# Patient Record
Sex: Male | Born: 1935 | Race: White | Hispanic: No | Smoking: Former smoker
Health system: Southern US, Community
[De-identification: ages and names within clinical notes are randomized; demographics above are authoritative.]

## PROBLEM LIST (undated history)

## (undated) DIAGNOSIS — N410 Acute prostatitis: Secondary | ICD-10-CM

## (undated) DIAGNOSIS — K5792 Diverticulitis of intestine, part unspecified, without perforation or abscess without bleeding: Secondary | ICD-10-CM

## (undated) DIAGNOSIS — E785 Hyperlipidemia, unspecified: Secondary | ICD-10-CM

## (undated) DIAGNOSIS — C189 Malignant neoplasm of colon, unspecified: Secondary | ICD-10-CM

## (undated) DIAGNOSIS — G909 Disorder of the autonomic nervous system, unspecified: Secondary | ICD-10-CM

## (undated) DIAGNOSIS — G619 Inflammatory polyneuropathy, unspecified: Secondary | ICD-10-CM

## (undated) DIAGNOSIS — I639 Cerebral infarction, unspecified: Secondary | ICD-10-CM

## (undated) DIAGNOSIS — G622 Polyneuropathy due to other toxic agents: Secondary | ICD-10-CM

## (undated) DIAGNOSIS — N400 Enlarged prostate without lower urinary tract symptoms: Secondary | ICD-10-CM

## (undated) DIAGNOSIS — Z8679 Personal history of other diseases of the circulatory system: Secondary | ICD-10-CM

## (undated) DIAGNOSIS — I5032 Chronic diastolic (congestive) heart failure: Secondary | ICD-10-CM

## (undated) DIAGNOSIS — I10 Essential (primary) hypertension: Secondary | ICD-10-CM

## (undated) DIAGNOSIS — G709 Myoneural disorder, unspecified: Secondary | ICD-10-CM

## (undated) DIAGNOSIS — G609 Hereditary and idiopathic neuropathy, unspecified: Secondary | ICD-10-CM

## (undated) DIAGNOSIS — E039 Hypothyroidism, unspecified: Secondary | ICD-10-CM

## (undated) DIAGNOSIS — L570 Actinic keratosis: Secondary | ICD-10-CM

## (undated) DIAGNOSIS — H44119 Panuveitis, unspecified eye: Secondary | ICD-10-CM

## (undated) DIAGNOSIS — D509 Iron deficiency anemia, unspecified: Secondary | ICD-10-CM

## (undated) DIAGNOSIS — I480 Paroxysmal atrial fibrillation: Secondary | ICD-10-CM

## (undated) DIAGNOSIS — R413 Other amnesia: Secondary | ICD-10-CM

## (undated) DIAGNOSIS — F0390 Unspecified dementia without behavioral disturbance: Secondary | ICD-10-CM

## (undated) DIAGNOSIS — I517 Cardiomegaly: Secondary | ICD-10-CM

## (undated) DIAGNOSIS — H3090 Unspecified chorioretinal inflammation, unspecified eye: Secondary | ICD-10-CM

## (undated) DIAGNOSIS — R269 Unspecified abnormalities of gait and mobility: Secondary | ICD-10-CM

## (undated) DIAGNOSIS — E079 Disorder of thyroid, unspecified: Secondary | ICD-10-CM

## (undated) DIAGNOSIS — M1990 Unspecified osteoarthritis, unspecified site: Secondary | ICD-10-CM

## (undated) DIAGNOSIS — G459 Transient cerebral ischemic attack, unspecified: Secondary | ICD-10-CM

## (undated) DIAGNOSIS — R42 Dizziness and giddiness: Secondary | ICD-10-CM

## (undated) DIAGNOSIS — I25810 Atherosclerosis of coronary artery bypass graft(s) without angina pectoris: Secondary | ICD-10-CM

## (undated) DIAGNOSIS — I6529 Occlusion and stenosis of unspecified carotid artery: Secondary | ICD-10-CM

## (undated) DIAGNOSIS — R609 Edema, unspecified: Secondary | ICD-10-CM

## (undated) DIAGNOSIS — G450 Vertebro-basilar artery syndrome: Secondary | ICD-10-CM

## (undated) HISTORY — DX: Hyperlipidemia, unspecified: E78.5

## (undated) HISTORY — DX: Paroxysmal atrial fibrillation: I48.0

## (undated) HISTORY — DX: Edema, unspecified: R60.9

## (undated) HISTORY — DX: Hereditary and idiopathic neuropathy, unspecified: G60.9

## (undated) HISTORY — DX: Essential (primary) hypertension: I10

## (undated) HISTORY — DX: Acute prostatitis: N41.0

## (undated) HISTORY — DX: Unspecified abnormalities of gait and mobility: R26.9

## (undated) HISTORY — DX: Disorder of thyroid, unspecified: E07.9

## (undated) HISTORY — DX: Benign prostatic hyperplasia without lower urinary tract symptoms: N40.0

## (undated) HISTORY — DX: Occlusion and stenosis of unspecified carotid artery: I65.29

## (undated) HISTORY — DX: Disorder of the autonomic nervous system, unspecified: G90.9

## (undated) HISTORY — DX: Polyneuropathy due to other toxic agents: G62.2

## (undated) HISTORY — DX: Unspecified dementia, unspecified severity, without behavioral disturbance, psychotic disturbance, mood disturbance, and anxiety: F03.90

## (undated) HISTORY — DX: Unspecified osteoarthritis, unspecified site: M19.90

## (undated) HISTORY — PX: APPENDECTOMY: SHX54

## (undated) HISTORY — DX: Dizziness and giddiness: R42

## (undated) HISTORY — DX: Panuveitis, unspecified eye: H44.119

## (undated) HISTORY — DX: Vertebro-basilar artery syndrome: G45.0

## (undated) HISTORY — DX: Transient cerebral ischemic attack, unspecified: G45.9

## (undated) HISTORY — DX: Iron deficiency anemia, unspecified: D50.9

## (undated) HISTORY — DX: Chronic diastolic (congestive) heart failure: I50.32

## (undated) HISTORY — DX: Cardiomegaly: I51.7

## (undated) HISTORY — DX: Cerebral infarction, unspecified: I63.9

## (undated) HISTORY — DX: Actinic keratosis: L57.0

## (undated) HISTORY — DX: Malignant neoplasm of colon, unspecified: C18.9

## (undated) HISTORY — DX: Personal history of other diseases of the circulatory system: Z86.79

## (undated) HISTORY — DX: Diverticulitis of intestine, part unspecified, without perforation or abscess without bleeding: K57.92

## (undated) HISTORY — DX: Atherosclerosis of coronary artery bypass graft(s) without angina pectoris: I25.810

## (undated) HISTORY — DX: Myoneural disorder, unspecified: G70.9

## (undated) HISTORY — DX: Other amnesia: R41.3

## (undated) HISTORY — DX: Hypothyroidism, unspecified: E03.9

## (undated) HISTORY — DX: Unspecified chorioretinal inflammation, unspecified eye: H30.90

## (undated) HISTORY — PX: CARDIAC CATHETERIZATION: SHX172

## (undated) HISTORY — DX: Inflammatory polyneuropathy, unspecified: G61.9

---

## 1989-02-18 HISTORY — PX: CORONARY ARTERY BYPASS GRAFT: SHX141

## 2000-04-05 ENCOUNTER — Ambulatory Visit (HOSPITAL_COMMUNITY): Admission: RE | Admit: 2000-04-05 | Discharge: 2000-04-05 | Payer: Self-pay | Admitting: Ophthalmology

## 2000-04-05 ENCOUNTER — Encounter: Payer: Self-pay | Admitting: Ophthalmology

## 2004-04-03 ENCOUNTER — Emergency Department (HOSPITAL_COMMUNITY): Admission: EM | Admit: 2004-04-03 | Discharge: 2004-04-03 | Payer: Self-pay | Admitting: Emergency Medicine

## 2005-01-12 ENCOUNTER — Emergency Department (HOSPITAL_COMMUNITY): Admission: EM | Admit: 2005-01-12 | Discharge: 2005-01-12 | Payer: Self-pay | Admitting: Emergency Medicine

## 2010-02-07 ENCOUNTER — Ambulatory Visit: Payer: Self-pay | Admitting: Cardiology

## 2010-12-06 ENCOUNTER — Telehealth: Payer: Self-pay | Admitting: Cardiology

## 2010-12-06 NOTE — Telephone Encounter (Signed)
Pt calling wanting to know if he needs to have his annual treadmill test when he comes to see [facility] on 10/30. Please return pt call to discuss further.

## 2010-12-11 ENCOUNTER — Other Ambulatory Visit: Payer: Self-pay | Admitting: *Deleted

## 2010-12-11 DIAGNOSIS — Z951 Presence of aortocoronary bypass graft: Secondary | ICD-10-CM

## 2010-12-11 NOTE — Telephone Encounter (Signed)
Spoke with patient and he only needed ETT, scheduled

## 2010-12-18 ENCOUNTER — Ambulatory Visit: Payer: Self-pay | Admitting: Cardiology

## 2010-12-21 DIAGNOSIS — H31109 Choroidal degeneration, unspecified, unspecified eye: Secondary | ICD-10-CM | POA: Insufficient documentation

## 2010-12-21 DIAGNOSIS — H3090 Unspecified chorioretinal inflammation, unspecified eye: Secondary | ICD-10-CM | POA: Insufficient documentation

## 2011-02-06 ENCOUNTER — Encounter: Payer: Self-pay | Admitting: *Deleted

## 2011-02-06 DIAGNOSIS — Z8679 Personal history of other diseases of the circulatory system: Secondary | ICD-10-CM | POA: Insufficient documentation

## 2011-02-08 ENCOUNTER — Ambulatory Visit (INDEPENDENT_AMBULATORY_CARE_PROVIDER_SITE_OTHER): Payer: Self-pay | Admitting: Cardiology

## 2011-02-08 DIAGNOSIS — Z951 Presence of aortocoronary bypass graft: Secondary | ICD-10-CM

## 2011-02-08 NOTE — Progress Notes (Signed)
Exercise Treadmill Test  Pre-Exercise Testing Evaluation Rhythm: sinus bradycardia  Rate: 57   PR:  18 QRS:  .08  QT:  .36 QTc: .35     Test  Exercise Tolerance Test Ordering MD: Cassell Clement M.D  Interpreting MD:  Cassell Clement M.D  Unique Test No: 1  Treadmill:  1  Indication for ETT: known ASHD  Contraindication to ETT: No   Stress Modality: exercise - treadmill  Cardiac Imaging Performed: non   Protocol: modified Bruce - maximal  Max BP:  161/112  Max MPHR (bpm):  137 85% MPR (bpm):  116  MPHR obtained (bpm):  99 % MPHR obtained:  75  Reached 85% MPHR (min:sec):   Total Exercise Time (min-sec):  6:00  Workload in METS:  3.0 Borg Scale: 15  Reason ETT Terminated:  patient's desire to stop    ST Segment Analysis At Rest: normal ST segments - no evidence of significant ST depression With Exercise: no evidence of significant ST depression  Other Information Arrhythmia:  Occassional PVC Angina during ETT:  absent (0) Quality of ETT:  non-diagnostic  ETT Interpretation:  normal - no evidence of ischemia by ST analysis  Comments: No angina with exercise.  Exercise limited by right leg weakness.  Recommendations: Continue present med.   Recheck 1 year.

## 2011-02-13 ENCOUNTER — Ambulatory Visit: Payer: Self-pay | Admitting: Cardiology

## 2011-02-13 ENCOUNTER — Encounter: Payer: Self-pay | Admitting: Cardiology

## 2011-02-14 ENCOUNTER — Telehealth: Payer: Self-pay | Admitting: Cardiology

## 2011-02-14 ENCOUNTER — Other Ambulatory Visit: Payer: Self-pay | Admitting: Family Medicine

## 2011-02-14 DIAGNOSIS — G459 Transient cerebral ischemic attack, unspecified: Secondary | ICD-10-CM

## 2011-02-14 NOTE — Telephone Encounter (Signed)
New Msg: Pt daughter calling wanting to know if pt needs to take both plavix and 81 mg aspirin. Pt was recently put on plavix 75 mg following TIA and hospital admission as prescribed by Dr. Madelin Rear at [facility] on [facility]. Please return pt daughter call to discuss further.

## 2011-02-14 NOTE — Telephone Encounter (Signed)
Advised daughter of change

## 2011-02-14 NOTE — Telephone Encounter (Signed)
Left message

## 2011-02-14 NOTE — Telephone Encounter (Signed)
Okay to take Plavix and reduce aspirin to just 81 mg a day. Okay to take Plavix today even though he has already taken a full 325 aspirin today

## 2011-02-14 NOTE — Telephone Encounter (Signed)
Patient was with daughter today and was at restaurant with his daughter when he went "out of it".  Per daughter the episode lasted for short time and they left and she took him to see his PCP.  Dr Madelin Rear wanted to start him on Plavix but daughter said she was to call and find out about the ASA, currently takes a 325 mg daily.  Also, has had that today, should he go ahead and take Plavix today.    Daughter waiting to hear back from PCP on when he goes to get carotid doppler.

## 2011-02-15 ENCOUNTER — Ambulatory Visit
Admission: RE | Admit: 2011-02-15 | Discharge: 2011-02-15 | Disposition: A | Discharge status: Home / Self Care | Payer: Medicare Other | Source: Ambulatory Visit | Attending: Family Medicine | Admitting: Family Medicine

## 2011-02-15 DIAGNOSIS — G459 Transient cerebral ischemic attack, unspecified: Secondary | ICD-10-CM

## 2011-02-22 DIAGNOSIS — H3090 Unspecified chorioretinal inflammation, unspecified eye: Secondary | ICD-10-CM | POA: Insufficient documentation

## 2011-04-18 ENCOUNTER — Other Ambulatory Visit (HOSPITAL_COMMUNITY): Payer: Self-pay | Admitting: Radiology

## 2011-04-18 DIAGNOSIS — G459 Transient cerebral ischemic attack, unspecified: Secondary | ICD-10-CM

## 2011-04-19 ENCOUNTER — Ambulatory Visit (HOSPITAL_COMMUNITY): Payer: Medicare Other | Attending: Cardiology

## 2011-04-19 ENCOUNTER — Other Ambulatory Visit: Payer: Self-pay

## 2011-04-19 DIAGNOSIS — I4891 Unspecified atrial fibrillation: Secondary | ICD-10-CM | POA: Insufficient documentation

## 2011-04-19 DIAGNOSIS — E785 Hyperlipidemia, unspecified: Secondary | ICD-10-CM | POA: Insufficient documentation

## 2011-04-19 DIAGNOSIS — G459 Transient cerebral ischemic attack, unspecified: Secondary | ICD-10-CM

## 2011-04-22 ENCOUNTER — Encounter (HOSPITAL_COMMUNITY): Payer: Self-pay | Admitting: Neurology

## 2012-07-04 ENCOUNTER — Encounter: Payer: Self-pay | Admitting: Nurse Practitioner

## 2012-07-04 DIAGNOSIS — G450 Vertebro-basilar artery syndrome: Secondary | ICD-10-CM

## 2012-07-04 DIAGNOSIS — R42 Dizziness and giddiness: Secondary | ICD-10-CM

## 2012-07-04 DIAGNOSIS — G609 Hereditary and idiopathic neuropathy, unspecified: Secondary | ICD-10-CM

## 2012-07-04 DIAGNOSIS — R2681 Unsteadiness on feet: Secondary | ICD-10-CM | POA: Insufficient documentation

## 2012-07-04 DIAGNOSIS — G909 Disorder of the autonomic nervous system, unspecified: Secondary | ICD-10-CM

## 2012-07-04 DIAGNOSIS — R269 Unspecified abnormalities of gait and mobility: Secondary | ICD-10-CM

## 2012-07-04 DIAGNOSIS — G459 Transient cerebral ischemic attack, unspecified: Secondary | ICD-10-CM

## 2012-07-04 DIAGNOSIS — G619 Inflammatory polyneuropathy, unspecified: Secondary | ICD-10-CM

## 2012-07-04 DIAGNOSIS — R413 Other amnesia: Secondary | ICD-10-CM

## 2012-07-04 DIAGNOSIS — G622 Polyneuropathy due to other toxic agents: Secondary | ICD-10-CM | POA: Insufficient documentation

## 2012-07-04 DIAGNOSIS — F0280 Dementia in other diseases classified elsewhere without behavioral disturbance: Secondary | ICD-10-CM | POA: Insufficient documentation

## 2012-07-04 DIAGNOSIS — G990 Autonomic neuropathy in diseases classified elsewhere: Secondary | ICD-10-CM | POA: Insufficient documentation

## 2012-07-04 HISTORY — DX: Disorder of the autonomic nervous system, unspecified: G90.9

## 2012-07-04 HISTORY — DX: Hereditary and idiopathic neuropathy, unspecified: G60.9

## 2012-07-04 HISTORY — DX: Dizziness and giddiness: R42

## 2012-07-04 HISTORY — DX: Vertebro-basilar artery syndrome: G45.0

## 2012-07-04 HISTORY — DX: Unspecified abnormalities of gait and mobility: R26.9

## 2012-07-04 HISTORY — DX: Inflammatory polyneuropathy, unspecified: G61.9

## 2012-07-04 HISTORY — DX: Transient cerebral ischemic attack, unspecified: G45.9

## 2012-07-06 ENCOUNTER — Ambulatory Visit (INDEPENDENT_AMBULATORY_CARE_PROVIDER_SITE_OTHER): Payer: Medicare Other | Admitting: Nurse Practitioner

## 2012-07-06 ENCOUNTER — Encounter: Payer: Self-pay | Admitting: Nurse Practitioner

## 2012-07-06 VITALS — BP 142/71 | HR 57 | Ht 70.0 in | Wt 177.0 lb

## 2012-07-06 DIAGNOSIS — G459 Transient cerebral ischemic attack, unspecified: Secondary | ICD-10-CM

## 2012-07-06 DIAGNOSIS — R413 Other amnesia: Secondary | ICD-10-CM

## 2012-07-06 DIAGNOSIS — G609 Hereditary and idiopathic neuropathy, unspecified: Secondary | ICD-10-CM

## 2012-07-06 DIAGNOSIS — R269 Unspecified abnormalities of gait and mobility: Secondary | ICD-10-CM

## 2012-07-06 NOTE — Patient Instructions (Addendum)
See  instructions for nursing home

## 2012-07-06 NOTE — Progress Notes (Signed)
HPI: Patient returns for followup after her last visit 06/24/2011. He had a  posterior circulation TIA in October 2009. He also has a history of chronic sensory polyneuropathy of undetermined origin with abnormality of gait. He is walking with a rolling walker last MRA of the head January 2013 shows no significant stenosis, MRA of the neck shows mild stenosis of the proximal left internal carotid artery. He has had no episodes of TIA or stroke since last seen. He denies dizziness numbness. He denies swallowing or speech difficulties. His appetite is good. He is in a skilled facility. He returns today with his daughter. Memory is stable  ROS:  - memory loss, confusion, weakness  Physical Exam General: well developed, well nourished, seated, in no evident distress Head: head normocephalic and atraumatic. Oropharynx benign Neck: supple with no carotid  bruits Cardiovascular: regular rate and rhythm, no murmurs  Neurologic Exam Mental Status: Awake and fully alert. Oriented to place and time. MMSE 22/30 items in orientation calculation and recall. Last MMSE 06/24/2011 was 23/30. AFT 13.  Mood and affect appropriate.  Cranial Nerves: Fundoscopic exam reveals sharp disc margins. Pupils equal, briskly reactive to light. Extraocular movements full without nystagmus. Visual fields full to confrontation. Hearing decreased bilaterally  Face, tongue, palate move normally and symmetrically.  Motor: Normal bulk and tone. Normal strength in all tested extremity muscles. No focal weakness Sensory.: Decreased vibratory to ankles decreased pinprick in stocking fashion to upper calf.  Coordination: Rapid alternating movements normal in all extremities. Finger-to-nose performed accurately bilaterally. Gait and Station: Arises from chair with pushoff  Stance is wide-based using a rolling walker, no difficulty with turns  Reflexes: 1+ and symmetric except absent ankle jerks. Toes downgoing.     ASSESSMENT: Remote  history of posterior circulation TIA in October 2009 and chronic sensory polyneuropathy of undetermined origin. Abnormality of gait episode of transient unresponsiveness 02/14/2011 with negative neurovascular workup     PLAN: We will continue his Plavix daily Continue his cerefolin , he has been changed  from Metanx.  His memory is stable from last year and is probably due to vascular memory loss Continue his home exercise program by walking several times a day for exercise He will followup in 6 months  Nilda Riggs, GNP-BC APRN

## 2012-10-07 ENCOUNTER — Ambulatory Visit: Payer: Self-pay | Admitting: Nurse Practitioner

## 2013-01-27 ENCOUNTER — Other Ambulatory Visit: Payer: Self-pay | Admitting: Family Medicine

## 2013-01-27 DIAGNOSIS — R410 Disorientation, unspecified: Secondary | ICD-10-CM

## 2013-01-27 DIAGNOSIS — R269 Unspecified abnormalities of gait and mobility: Secondary | ICD-10-CM

## 2013-01-29 ENCOUNTER — Ambulatory Visit
Admission: RE | Admit: 2013-01-29 | Discharge: 2013-01-29 | Disposition: A | Discharge status: Home / Self Care | Payer: Medicare Other | Source: Ambulatory Visit | Attending: Family Medicine | Admitting: Family Medicine

## 2013-01-29 ENCOUNTER — Other Ambulatory Visit: Payer: Medicare Other

## 2013-01-29 DIAGNOSIS — R269 Unspecified abnormalities of gait and mobility: Secondary | ICD-10-CM

## 2013-01-29 DIAGNOSIS — R410 Disorientation, unspecified: Secondary | ICD-10-CM

## 2013-09-22 ENCOUNTER — Encounter: Payer: Self-pay | Admitting: Nurse Practitioner

## 2013-09-22 ENCOUNTER — Ambulatory Visit (INDEPENDENT_AMBULATORY_CARE_PROVIDER_SITE_OTHER): Payer: Medicare Other | Admitting: Nurse Practitioner

## 2013-09-22 VITALS — BP 123/66 | HR 82 | Ht 70.0 in | Wt 187.0 lb

## 2013-09-22 DIAGNOSIS — G609 Hereditary and idiopathic neuropathy, unspecified: Secondary | ICD-10-CM

## 2013-09-22 DIAGNOSIS — G459 Transient cerebral ischemic attack, unspecified: Secondary | ICD-10-CM

## 2013-09-22 DIAGNOSIS — R269 Unspecified abnormalities of gait and mobility: Secondary | ICD-10-CM

## 2013-09-22 DIAGNOSIS — R413 Other amnesia: Secondary | ICD-10-CM

## 2013-09-22 NOTE — Patient Instructions (Signed)
Memory score is stable Continue Plavix for secondary stroke prevention Continue walking for exercise, have wheelchair wheel fixed to prevent falls F/U in 6 months

## 2013-09-22 NOTE — Progress Notes (Signed)
[facility]  PATIENT: Jesse Burton DOB: September 04, 1925   REASON FOR VISIT: follow up for history of TIA and  sensory polyneuropathy, gait disorder   HISTORY OF PRESENT ILLNESS: Jesse Burton, 80 year old male returns for followup. He was last seen on 5 /19/ 2014. He had a posterior circulation TIA in October 2009. He also has a history of chronic sensory polyneuropathy of undetermined origin with abnormality of gait. He is walking with a rolling walker.  Last MRA of the head January 2013 shows no significant stenosis, MRA of the neck shows mild stenosis of the proximal left internal carotid artery. He has had no episodes of TIA or stroke since last seen. He denies dizziness, numbness. He denies swallowing or speech difficulties. His appetite is good. He is in a skilled facility. He has not had any falls.  He returns today with his daughter. Memory is stable from previous MMSE.   REVIEW OF SYSTEMS: Full 14 system review of systems performed and notable only for those listed, all others are neg:  Constitutional: N/A  Cardiovascular: N/A  Ear/Nose/Throat: N/A  Skin: N/A  Eyes: N/A  Respiratory: N/A  Gastroitestinal: N/A  Hematology/Lymphatic: N/A  Endocrine: N/A Musculoskeletal:N/A  Allergy/Immunology: N/A  Neurological: Memory loss Psychiatric: Decreased concentration Sleep : NA   ALLERGIES: Allergies  Allergen Reactions  . Sulfa Antibiotics     HOME MEDICATIONS: Outpatient Prescriptions Prior to Visit  Medication Sig Dispense Refill  . atorvastatin (LIPITOR) 40 MG tablet Take 40 mg by mouth daily.      Marland Kitchen azelastine (ASTELIN) 137 MCG/SPRAY nasal spray       . clopidogrel (PLAVIX) 75 MG tablet Take 75 mg by mouth daily.      Marland Kitchen GLUCOSAMINE PO Take by mouth. Taking 250 daily       . L-Methylfolate-B12-B6-B2 (CEREFOLIN PO) Take by mouth daily.        Marland Kitchen lisinopril (PRINIVIL,ZESTRIL) 10 MG tablet       . losartan (COZAAR) 50 MG tablet       . Multiple Vitamin  (MULTIVITAMIN) tablet Take 1 tablet by mouth daily.        . multivitamin (METANX) 3-35-2 MG TABS tablet Take 1 tablet by mouth daily.      . mycophenolate (CELLCEPT) 500 MG tablet Take by mouth 2 (two) times daily.      . Tamsulosin HCl (FLOMAX) 0.4 MG CAPS Take by mouth. Takes two daily       . aspirin 325 MG tablet Take 325 mg by mouth daily.        . digoxin (LANOXIN) 0.25 MG tablet Take 250 mcg by mouth daily.       Marland Kitchen levothyroxine (SYNTHROID, LEVOTHROID) 50 MCG tablet Take 50 mcg by mouth daily.        . simvastatin (ZOCOR) 80 MG tablet Take 80 mg by mouth at bedtime.         No facility-administered medications prior to visit.    PAST MEDICAL HISTORY: Past Medical History  Diagnosis Date  . History of atrial fibrillation   . Coronary artery disease   . Thyroid disease   . Memory loss   . Stroke     PAST SURGICAL HISTORY: Past Surgical History  Procedure Laterality Date  . Cardiac catheterization    . Coronary artery bypass graft  1991    grafts to LAD and RCA    FAMILY HISTORY: Family History  Problem Relation Age of Onset  . Congestive Heart Failure Father   .  Pneumonia Mother     SOCIAL HISTORY: History   Social History  . Marital Status: Widowed    Spouse Name: N/A    Number of Children: 3  . Years of Education: [place]   Occupational History  .     Social History Main Topics  . Smoking status: Former Research scientist (life sciences)  . Smokeless tobacco: Never Used  . Alcohol Use: No  . Drug Use: No  . Sexual Activity: Not on file   Other Topics Concern  . Not on file   Social History Narrative   Patient is retired from job of 42 years. Patient lives a lone. Pt is recently widowed after 43 years of marriage. Patient has 3 children. Patient drinks 2 cups caffeine a week, quit tobacco use in 1980 and has one drink per month.      PHYSICAL EXAM  Filed Vitals:   09/22/13 1521  BP: 123/66  Pulse: 82  Height: 5\' 10"  (1.778 m)  Weight: 187 lb (84.823 kg)   Body  mass index is 26.83 kg/(m^2). General: well developed, well nourished, seated, in no evident distress  Head: head normocephalic and atraumatic. Oropharynx benign  Neck: supple with no carotid bruits  Cardiovascular: regular rate and rhythm, no murmurs  Neurologic Exam  Mental Status: Awake and fully alert. Oriented to place and time. MMSE 23/30 items in orientation calculation and recall. AFT 9. Mood and affect appropriate.  Cranial Nerves:  Pupils equal, briskly reactive to light. Extraocular movements full without nystagmus. Visual fields full to confrontation. Hearing decreased bilaterally Face, tongue, palate move normally and symmetrically.  Motor: Normal bulk and tone. Normal strength in all tested extremity muscles. No focal weakness  Sensory.: Decreased vibratory to ankles decreased pinprick in stocking fashion to upper calf.  Coordination: Rapid alternating movements normal in all extremities. Finger-to-nose performed accurately bilaterally.  Gait and Station: Arises from chair with pushoff Stance is wide-based using a rolling walker, no difficulty with turns  Reflexes: 1+ and symmetric except absent ankle jerks. Toes downgoing.     DIAGNOSTIC DATA (LABS, IMAGING, TESTING) -  ASSESSMENT AND PLAN  80 y.o. year old male  has a past medical history of posterior circulation TIA in October 2009 and chronic sensory polyneuropathy of undetermined origin. Abnormality of gait episode of transient unresponsiveness 02/14/2011 with negative neurovascular workup.    Continue Plavix for secondary stroke prevention Continue walking for exercise, have wheelchair wheel fixed to prevent falls F/U in 6 months, next visit with Dr. Lenis Dickinson Cecille Rubin, [facility], [facility], [facility] [address] [place], [place] [address] [phone]

## 2013-09-23 NOTE — Progress Notes (Signed)
I agree with the above plan 

## 2013-12-01 ENCOUNTER — Encounter: Payer: Self-pay | Admitting: Nurse Practitioner

## 2013-12-01 ENCOUNTER — Non-Acute Institutional Stay (SKILLED_NURSING_FACILITY): Payer: Medicare Other | Admitting: Nurse Practitioner

## 2013-12-01 DIAGNOSIS — G619 Inflammatory polyneuropathy, unspecified: Secondary | ICD-10-CM

## 2013-12-01 DIAGNOSIS — I25811 Atherosclerosis of native coronary artery of transplanted heart without angina pectoris: Secondary | ICD-10-CM

## 2013-12-01 DIAGNOSIS — R413 Other amnesia: Secondary | ICD-10-CM

## 2013-12-01 DIAGNOSIS — R269 Unspecified abnormalities of gait and mobility: Secondary | ICD-10-CM

## 2013-12-01 DIAGNOSIS — E039 Hypothyroidism, unspecified: Secondary | ICD-10-CM

## 2013-12-01 DIAGNOSIS — G609 Hereditary and idiopathic neuropathy, unspecified: Secondary | ICD-10-CM

## 2013-12-01 DIAGNOSIS — I10 Essential (primary) hypertension: Secondary | ICD-10-CM

## 2013-12-01 DIAGNOSIS — I25769 Atherosclerosis of bypass graft of coronary artery of transplanted heart with unspecified angina pectoris: Secondary | ICD-10-CM

## 2013-12-01 DIAGNOSIS — G622 Polyneuropathy due to other toxic agents: Secondary | ICD-10-CM

## 2013-12-01 DIAGNOSIS — G458 Other transient cerebral ischemic attacks and related syndromes: Secondary | ICD-10-CM

## 2013-12-01 DIAGNOSIS — Z8679 Personal history of other diseases of the circulatory system: Secondary | ICD-10-CM

## 2013-12-01 HISTORY — DX: Essential (primary) hypertension: I10

## 2013-12-01 HISTORY — DX: Hypothyroidism, unspecified: E03.9

## 2013-12-01 NOTE — Assessment & Plan Note (Signed)
Mild elevated Sbp. Takes Losartan 50mg  and Furosemide 20mg /40mg  alternating dose. Update CMp

## 2013-12-01 NOTE — Progress Notes (Signed)
Patient ID: Jesse Burton, male   DOB: 1926/01/29, 80 y.o.   MRN: [id number]   Code Status: full code  Allergies  Allergen Reactions  . Sulfa Antibiotics     Chief Complaint  Patient presents with  . Medical Management of Chronic Issues    HPI: Patient is a 80 y.o. male seen in the SNF at [facility] today for evaluation of  chronic medical conditions.       Problem List Items Addressed This Visit   Transient cerebral ischemia - Primary     a posterior circulation TIA in October 2009. Takes Plavix and Lipitor for risk reduction.     Memory loss     Will update MMSE    Inflammatory and toxic neuropathy     Managed with Cellcept    Hypothyroidism     Takes Levothyroxine 53mcg daily. Update TSH and CBC    HTN (hypertension)     Mild elevated Sbp. Takes Losartan 50mg  and Furosemide 20mg /40mg  alternating dose. Update CMp    History of atrial fibrillation     Heart rate is in control, takes Digoxin. Update Digoxin level.     Hereditary and idiopathic peripheral neuropathy     a history of chronic sensory polyneuropathy of undetermined origin with abnormality of gait. He is walking with a rolling walker. Last MRA of the head January 2013 shows no significant stenosis, MRA of the neck shows mild stenosis of the proximal left internal carotid artery.  Takes Metanx, Tylenol, Tramadol, and Cellcept. Ambulates with walker.  F/u Neurology    CAD (coronary artery disease) of artery bypass graft     No angina since admitted to SNF [facility]. F/u Cardiology.     Abnormality of gait     Continue therapy to maintain his physical functional level.        Review of Systems:  Review of Systems  Constitutional: Negative for fever, chills, weight loss, malaise/fatigue and diaphoresis.  HENT: Positive for hearing loss. Negative for congestion, ear discharge, ear pain, nosebleeds, sore throat and tinnitus.        Mild  Eyes: Negative for blurred vision, double vision, photophobia,  pain, discharge and redness.  Respiratory: Negative for cough, hemoptysis, sputum production, shortness of breath, wheezing and stridor.   Cardiovascular: Negative for chest pain, palpitations, orthopnea, claudication, leg swelling and PND.  Gastrointestinal: Negative for heartburn, nausea, vomiting, abdominal pain, diarrhea, constipation, blood in stool and melena.  Genitourinary: Negative for dysuria, urgency, frequency, hematuria and flank pain.  Musculoskeletal: Positive for joint pain. Negative for back pain, falls, myalgias and neck pain.       Aches in BUE  Skin: Negative for itching and rash.  Neurological: Positive for tingling and sensory change. Negative for dizziness, tremors, speech change, focal weakness, seizures, loss of consciousness, weakness and headaches.  Endo/Heme/Allergies: Negative for environmental allergies and polydipsia. Does not bruise/bleed easily.  Psychiatric/Behavioral: Positive for memory loss. Negative for depression, suicidal ideas, hallucinations and substance abuse. The patient is not nervous/anxious and does not have insomnia.    (type .ros)  Past Medical History  Diagnosis Date  . History of atrial fibrillation   . Coronary artery disease   . Thyroid disease   . Memory loss   . Stroke    Past Surgical History  Procedure Laterality Date  . Cardiac catheterization    . Coronary artery bypass graft  1991    grafts to LAD and RCA   Social History:   reports that  he has quit smoking. He has never used smokeless tobacco. He reports that he does not drink alcohol or use illicit drugs.  Family History  Problem Relation Age of Onset  . Congestive Heart Failure Father   . Pneumonia Mother     Medications: Patient's Medications  New Prescriptions   No medications on file  Previous Medications   ACETAMINOPHEN (TYLENOL) 325 MG TABLET    Take 650 mg by mouth every 6 (six) hours as needed.   ATORVASTATIN (LIPITOR) 40 MG TABLET    Take 40 mg by mouth  daily.   AZELASTINE (ASTELIN) 137 MCG/SPRAY NASAL SPRAY       CETIRIZINE (ZYRTEC) 10 MG TABLET    Take 10 mg by mouth daily.   CLOPIDOGREL (PLAVIX) 75 MG TABLET    Take 75 mg by mouth daily.   DIGOXIN (LANOXIN) 0.125 MG TABLET    Take 0.125 mg by mouth daily.   FUROSEMIDE (LASIX) 20 MG TABLET    Take 20 mg by mouth daily. Taking 1 tablet every other day, and 2 pills = 40 mg on the other days   GLUCOSAMINE PO    Take by mouth. Taking 250 daily    L-METHYLFOLATE-B12-B6-B2 (CEREFOLIN PO)    Take by mouth daily.     LEVOTHYROXINE (SYNTHROID, LEVOTHROID) 88 MCG TABLET    Take 88 mcg by mouth daily before breakfast.   LISINOPRIL (PRINIVIL,ZESTRIL) 10 MG TABLET       LOSARTAN (COZAAR) 50 MG TABLET       MULTIPLE VITAMIN (MULTIVITAMIN) TABLET    Take 1 tablet by mouth daily.     MULTIVITAMIN (METANX) 3-35-2 MG TABS TABLET    Take 1 tablet by mouth daily.   MYCOPHENOLATE (CELLCEPT) 500 MG TABLET    Take by mouth 2 (two) times daily.   TAMSULOSIN HCL (FLOMAX) 0.4 MG CAPS    Take by mouth. Takes two daily    TRAMADOL (ULTRAM) 50 MG TABLET    Take 50 mg by mouth every 6 (six) hours as needed.  Modified Medications   No medications on file  Discontinued Medications   No medications on file     Physical Exam: Physical Exam  Constitutional: He appears well-developed and well-nourished. No distress.  HENT:  Head: Normocephalic and atraumatic.  Right Ear: External ear normal.  Left Ear: External ear normal.  Nose: Nose normal.  Mouth/Throat: Oropharynx is clear and moist. No oropharyngeal exudate.  Eyes: Conjunctivae and EOM are normal. Pupils are equal, round, and reactive to light. Right eye exhibits no discharge. Left eye exhibits no discharge. No scleral icterus.  Neck: Normal range of motion. Neck supple. No JVD present. No tracheal deviation present. No thyromegaly present.  Cardiovascular: Normal rate and regular rhythm.  Exam reveals no gallop and no friction rub.   Murmur heard. Systolic  murmur 5-6/8 right sternal border   Pulmonary/Chest: Effort normal and breath sounds normal. No stridor. No respiratory distress. He has no wheezes. He has no rales. He exhibits no tenderness.  Abdominal: Soft. Bowel sounds are normal. He exhibits no distension and no mass. There is no tenderness. There is no rebound and no guarding.  Musculoskeletal: Normal range of motion. He exhibits no edema and no tenderness.  Lymphadenopathy:    He has no cervical adenopathy.  Neurological: He is alert. He has normal reflexes. He displays normal reflexes. No cranial nerve deficit. He exhibits normal muscle tone. Coordination normal.  Skin: Skin is warm and dry. No rash noted. He  is not diaphoretic. No erythema. No pallor.  Psychiatric: He has a normal mood and affect. His behavior is normal. Judgment and thought content normal. Cognition and memory are impaired. He exhibits abnormal recent memory. He exhibits normal remote memory.    Filed Vitals:   12/01/13 1237  BP: 162/80  Pulse: 78  Temp: 97.6 F (36.4 C)  TempSrc: Tympanic  Resp: 20      Labs reviewed: Basic Metabolic Panel: No results found for this basename: NA, K, CL, CO2, GLUCOSE, BUN, CREATININE, CALCIUM, MG, PHOS, TSH,  in the last 8760 hours Liver Function Tests: No results found for this basename: AST, ALT, ALKPHOS, BILITOT, PROT, ALBUMIN,  in the last 8760 hours No results found for this basename: LIPASE, AMYLASE,  in the last 8760 hours No results found for this basename: AMMONIA,  in the last 8760 hours CBC: No results found for this basename: WBC, NEUTROABS, HGB, HCT, MCV, PLT,  in the last 8760 hours Lipid Panel: No results found for this basename: CHOL, HDL, LDLCALC, TRIG, CHOLHDL, LDLDIRECT,  in the last 8760 hours  Past Procedures:     Assessment/Plan Transient cerebral ischemia a posterior circulation TIA in October 2009. Takes Plavix and Lipitor for risk reduction.   Hereditary and idiopathic peripheral  neuropathy a history of chronic sensory polyneuropathy of undetermined origin with abnormality of gait. He is walking with a rolling walker. Last MRA of the head January 2013 shows no significant stenosis, MRA of the neck shows mild stenosis of the proximal left internal carotid artery.  Takes Metanx, Tylenol, Tramadol, and Cellcept. Ambulates with walker.  F/u Neurology  History of atrial fibrillation Heart rate is in control, takes Digoxin. Update Digoxin level.   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 Update TSH and CBC  HTN (hypertension) Mild elevated Sbp. Takes Losartan 50mg  and Furosemide 20mg /40mg  alternating dose. Update CMp  Abnormality of gait Continue therapy to maintain his physical functional level.   CAD (coronary artery disease) of artery bypass graft No angina since admitted to SNF [facility]. F/u Cardiology.     Family/ Staff Communication: observe the patient  Goals of Care: SNF  Labs/tests ordered: CBC, CMP, TSH, Digoxin level, MMSE

## 2013-12-01 NOTE — Assessment & Plan Note (Addendum)
a history of chronic sensory polyneuropathy of undetermined origin with abnormality of gait. He is walking with a rolling walker. Last MRA of the head January 2013 shows no significant stenosis, MRA of the neck shows mild stenosis of the proximal left internal carotid artery.  Takes Metanx, Tylenol, Tramadol, and Cellcept. Ambulates with walker.  F/u Neurology

## 2013-12-01 NOTE — Assessment & Plan Note (Addendum)
Heart rate is in control, takes Digoxin. Update Digoxin level.

## 2013-12-01 NOTE — Assessment & Plan Note (Addendum)
Takes Levothyroxine 12mcg daily. Update TSH and CBC

## 2013-12-01 NOTE — Assessment & Plan Note (Signed)
Managed with Cellcept  

## 2013-12-01 NOTE — Assessment & Plan Note (Signed)
a posterior circulation TIA in October 2009. Takes Plavix and Lipitor for risk reduction.     

## 2013-12-01 NOTE — Assessment & Plan Note (Signed)
Continue therapy to maintain his physical functional level.

## 2013-12-01 NOTE — Assessment & Plan Note (Signed)
Will update MMSE °

## 2013-12-02 ENCOUNTER — Other Ambulatory Visit: Payer: Self-pay | Admitting: Family

## 2013-12-02 ENCOUNTER — Non-Acute Institutional Stay (SKILLED_NURSING_FACILITY): Payer: Medicare Other | Admitting: Internal Medicine

## 2013-12-02 ENCOUNTER — Encounter: Payer: Self-pay | Admitting: Internal Medicine

## 2013-12-02 DIAGNOSIS — I10 Essential (primary) hypertension: Secondary | ICD-10-CM

## 2013-12-02 DIAGNOSIS — R269 Unspecified abnormalities of gait and mobility: Secondary | ICD-10-CM

## 2013-12-02 DIAGNOSIS — N400 Enlarged prostate without lower urinary tract symptoms: Secondary | ICD-10-CM | POA: Insufficient documentation

## 2013-12-02 DIAGNOSIS — I25810 Atherosclerosis of coronary artery bypass graft(s) without angina pectoris: Secondary | ICD-10-CM | POA: Insufficient documentation

## 2013-12-02 DIAGNOSIS — Z8679 Personal history of other diseases of the circulatory system: Secondary | ICD-10-CM

## 2013-12-02 DIAGNOSIS — G609 Hereditary and idiopathic neuropathy, unspecified: Secondary | ICD-10-CM

## 2013-12-02 DIAGNOSIS — R609 Edema, unspecified: Secondary | ICD-10-CM | POA: Insufficient documentation

## 2013-12-02 DIAGNOSIS — R413 Other amnesia: Secondary | ICD-10-CM

## 2013-12-02 HISTORY — DX: Atherosclerosis of coronary artery bypass graft(s) without angina pectoris: I25.810

## 2013-12-02 LAB — HEPATIC FUNCTION PANEL
ALK PHOS: 64 U/L (ref 25–125)
ALT: 32 U/L (ref 10–40)
AST: 28 U/L (ref 14–40)
Bilirubin, Total: 0.9 mg/dL

## 2013-12-02 LAB — CBC AND DIFFERENTIAL
HEMATOCRIT: 34 % — AB (ref 41–53)
HEMOGLOBIN: 11.4 g/dL — AB (ref 13.5–17.5)
Platelets: 214 10*3/uL (ref 150–399)
WBC: 4.2 10^3/mL

## 2013-12-02 LAB — BASIC METABOLIC PANEL
BUN: 22 mg/dL — AB (ref 4–21)
Creatinine: 1 mg/dL (ref 0.6–1.3)
Glucose: 90 mg/dL
POTASSIUM: 3.6 mmol/L (ref 3.4–5.3)
SODIUM: 141 mmol/L (ref 137–147)

## 2013-12-02 LAB — TSH: TSH: 3.22 u[IU]/mL (ref 0.41–5.90)

## 2013-12-02 NOTE — Assessment & Plan Note (Signed)
No angina since admitted to SNF [facility]. F/u Cardiology.

## 2013-12-02 NOTE — Progress Notes (Deleted)
Patient ID: Jesse Burton, male   DOB: March 26, 1925, 80 y.o.   MRN: [id number]    HISTORY AND PHYSICAL  Location:  [facility] Room Number: 30 Place of Service: SNF [phone])   Extended Emergency Contact Information Primary Emergency Contact: Farr,Laura Address: [address] [address] [place] Phone: [phone] Mobile Phone: [phone] Relation: Daughter Secondary Emergency Contact: Genia Plants [place] of [place] Phone: [phone] Relation: Daughter   Chief Complaint  Patient presents with  . New Admit To SNF    increased dependence, secondary to dementia    HPI:  ***  Past Medical History  Diagnosis Date  . History of atrial fibrillation   . Coronary artery disease   . Thyroid disease   . Memory loss   . Stroke     Past Surgical History  Procedure Laterality Date  . Cardiac catheterization    . Coronary artery bypass graft  1991    grafts to LAD and RCA    Patient Care Team: Hulan Fess, MD as PCP - General (Family Medicine)  History   Social History  . Marital Status: Widowed    Spouse Name: N/A    Number of Children: 3  . Years of Education: [employer]   Occupational History  .     Social History Main Topics  . Smoking status: Former Research scientist (life sciences)  . Smokeless tobacco: Never Used  . Alcohol Use: No  . Drug Use: No  . Sexual Activity: Not on file   Other Topics Concern  . Not on file   Social History Narrative   Patient is retired from job of 32 years. Patient lives a lone. Pt is recently widowed after 73 years of marriage. Patient has 3 children. Patient drinks 2 cups caffeine a week, quit tobacco use in 1980 and has one drink per month.      reports that he has quit smoking. He has never used smokeless tobacco. He reports that he does not drink alcohol or use illicit drugs.   There is no immunization history on file for this patient.  Allergies  Allergen  Reactions  . Sulfa Antibiotics     Medications: Patient's Medications  New Prescriptions   No medications on file  Previous Medications   ACETAMINOPHEN (TYLENOL) 325 MG TABLET    Take 650 mg by mouth every 6 (six) hours as needed.   ATORVASTATIN (LIPITOR) 40 MG TABLET    Take 40 mg by mouth daily.   AZELASTINE (ASTELIN) 137 MCG/SPRAY NASAL SPRAY       CETIRIZINE (ZYRTEC) 10 MG TABLET    Take 10 mg by mouth daily.   CLOPIDOGREL (PLAVIX) 75 MG TABLET    Take 75 mg by mouth daily.   DIGOXIN (LANOXIN) 0.125 MG TABLET    Take 0.125 mg by mouth daily.   FUROSEMIDE (LASIX) 20 MG TABLET    Take 20 mg by mouth daily. Taking 1 tablet every other day, and 2 pills = 40 mg on the other days   GLUCOSAMINE PO    Take by mouth. Taking 250 daily    L-METHYLFOLATE-B12-B6-B2 (CEREFOLIN PO)    Take by mouth daily.     LEVOTHYROXINE (SYNTHROID, LEVOTHROID) 88 MCG TABLET    Take 88 mcg by mouth daily before breakfast.   LISINOPRIL (PRINIVIL,ZESTRIL) 10 MG TABLET       LOSARTAN (COZAAR) 50 MG TABLET  MULTIPLE VITAMIN (MULTIVITAMIN) TABLET    Take 1 tablet by mouth daily.     MULTIVITAMIN (METANX) 3-35-2 MG TABS TABLET    Take 1 tablet by mouth daily.   MYCOPHENOLATE (CELLCEPT) 500 MG TABLET    Take by mouth 2 (two) times daily.   TAMSULOSIN HCL (FLOMAX) 0.4 MG CAPS    Take by mouth. Takes two daily    TRAMADOL (ULTRAM) 50 MG TABLET    Take 50 mg by mouth every 6 (six) hours as needed.  Modified Medications   No medications on file  Discontinued Medications   No medications on file     Review of Systems  Filed Vitals:   12/02/13 1100  BP: 120/65  Pulse: 72  Temp: 97.6 F (36.4 C)  Resp: 16  Height: 5\' 6"  (1.676 m)  Weight: 185 lb 6.4 oz (84.097 kg)   Body mass index is 29.94 kg/(m^2).  Physical Exam   Labs reviewed: No results found for any previous visit.  No results found.   Assessment/Plan

## 2013-12-02 NOTE — Progress Notes (Signed)
Patient ID: Jesse Burton, male   DOB: 1925-06-24, 80 y.o.   MRN: [id number]    HISTORY AND PHYSICAL  Location:  [facility] Room Number: 29 Place of Service: SNF [phone])   Extended Emergency Contact Information Primary Emergency Contact: Farr,Laura Address: [address] [place] [address] [place] Phone: [phone] Mobile Phone: [phone] Relation: Daughter Secondary Emergency Contact: Genia Plants [place] of [place] Phone: [phone] Relation: Daughter   Chief Complaint  Patient presents with  . New Admit To SNF    increased dependence, secondary to dementia    HPI:  Memory loss: has recently moved to SNF d/t increasing care needs and increased confusion. Today is disoriented to time, place and situation. Recently wandered off campus and was unable to find his way back to [facility].  Essential hypertension: controlled  Abnormality of gait: unsteady gait, walks independently with walker, reports improvement since increased PT involvement  Edema: stable, trace edema to bil LEs  History of atrial fibrillation: Controlled    Past Medical History  Diagnosis Date  . History of atrial fibrillation   . Thyroid disease   . Memory loss   . Stroke   . 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 Transient cerebral ischemia 07/04/2012    a posterior circulation TIA in October 2009.    Marland Kitchen HTN (hypertension) 12/01/2013  . CAD (coronary artery disease) of artery bypass graft 12/02/2013  . Hypothyroidism 12/01/2013  . Peripheral autonomic neuropathy in disorders classified elsewhere(337.1) 07/04/2012  . 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 Hereditary and idiopathic peripheral neuropathy 07/04/2012    a history of chronic sensory polyneuropathy of undetermined origin with abnormality of gait. He is walking with a rolling walker. Last MRA of the head January 2013 shows no significant stenosis, MRA of the  neck shows mild stenosis of the proximal left internal carotid artery.    . Abnormality of gait 07/04/2012  . Dizziness and giddiness 07/04/2012  . Benign prostatic hyperplasia   . Edema   . Chorioretinitis   . Panuveitis   . Internal carotid artery stenosis     left  . Mild left ventricular hypertrophy   . Actinic keratoses   . Diverticulitis   . Prostatitis, acute     1991    Past Surgical History  Procedure Laterality Date  . Cardiac catheterization    . Coronary artery bypass graft  1991    grafts to LAD and RCA  . Appendectomy      Patient Care Team: Estill Dooms, MD as PCP - General (Internal Medicine) Zebedee Iba, MD as Referring Physician (Ophthalmology) Darlin Coco, MD as Consulting Physician (Cardiology) Antony Contras, MD as Consulting Physician (Neurology) Festus Aloe, MD as Consulting Physician (Urology)  History   Social History  . Marital Status: Widowed    Spouse Name: N/A    Number of Children: 3  . Years of Education: [employer]   Occupational History  .     Social History Main Topics  . Smoking status: Former Smoker    Quit date: 12/03/1978  . Smokeless tobacco: Never Used  . Alcohol Use: No  . Drug Use: No  . Sexual Activity: Not on file   Other Topics Concern  . Not on file   Social History Narrative   Patient is retired from job of 60 years. Patient lives a lone. Pt is recently widowed after 31  years of marriage. Patient has 3 children. Patient drinks 2 cups caffeine a week, quit tobacco use in 1980 and has one drink per month.      reports that he quit smoking about 35 years ago. He has never used smokeless tobacco. He reports that he does not drink alcohol or use illicit drugs.  Immunization History  Administered Date(s) Administered  . Pneumococcal Polysaccharide-23 12/02/1993  . Tdap 01/08/2013  . Zoster 12/03/2007    Allergies  Allergen Reactions  . Sulfa Antibiotics     Medications: Patient's Medications    New Prescriptions   No medications on file  Previous Medications   ACETAMINOPHEN (TYLENOL) 325 MG TABLET    Take 650 mg by mouth every 6 (six) hours as needed.   ATORVASTATIN (LIPITOR) 40 MG TABLET    Take 40 mg by mouth daily.   AZELASTINE (ASTELIN) 137 MCG/SPRAY NASAL SPRAY       CETIRIZINE (ZYRTEC) 10 MG TABLET    Take 10 mg by mouth daily.   CLOPIDOGREL (PLAVIX) 75 MG TABLET    Take 75 mg by mouth daily.   DIGOXIN (LANOXIN) 0.125 MG TABLET    Take 0.125 mg by mouth daily.   FUROSEMIDE (LASIX) 20 MG TABLET    Take 20 mg by mouth daily. Taking 1 tablet every other day, and 2 pills = 40 mg on the other days   GLUCOSAMINE PO    Take by mouth. Taking 250 daily    L-METHYLFOLATE-B12-B6-B2 (CEREFOLIN PO)    Take by mouth daily.     LEVOTHYROXINE (SYNTHROID, LEVOTHROID) 88 MCG TABLET    Take 88 mcg by mouth daily before breakfast.   LISINOPRIL (PRINIVIL,ZESTRIL) 10 MG TABLET       LOSARTAN (COZAAR) 50 MG TABLET       MULTIPLE VITAMIN (MULTIVITAMIN) TABLET    Take 1 tablet by mouth daily.     MULTIVITAMIN (METANX) 3-35-2 MG TABS TABLET    Take 1 tablet by mouth daily.   MYCOPHENOLATE (CELLCEPT) 500 MG TABLET    Take by mouth 2 (two) times daily.   TAMSULOSIN HCL (FLOMAX) 0.4 MG CAPS    Take by mouth. Takes two daily    TRAMADOL (ULTRAM) 50 MG TABLET    Take 50 mg by mouth every 6 (six) hours as needed.  Modified Medications   No medications on file  Discontinued Medications   No medications on file     Review of Systems  Constitutional: Negative for activity change, appetite change and unexpected weight change.  HENT: Negative.   Eyes: Negative.   Respiratory: Negative for cough, chest tightness and shortness of breath.   Cardiovascular: Positive for leg swelling. Negative for chest pain and palpitations.  Gastrointestinal: Negative for abdominal pain and abdominal distention.  Endocrine: Negative.   Genitourinary: Negative for frequency, enuresis and difficulty urinating.   Musculoskeletal: Positive for arthralgias. Negative for back pain.  Allergic/Immunologic: Negative.   Neurological: Positive for dizziness. Negative for weakness, light-headedness and headaches.       Memory   Hematological: Negative.   Psychiatric/Behavioral: Positive for confusion (Prior to admission to SNF was found by a neighbor off campus, pt had wandered off site and was unable to find his way back to [facility]) and decreased concentration. Negative for agitation.    Filed Vitals:   12/02/13 1100  BP: 120/65  Pulse: 72  Temp: 97.6 F (36.4 C)  Resp: 16  Height: 5\' 6"  (1.676 m)  Weight: 185 lb 6.4 oz (84.097  kg)   Body mass index is 29.94 kg/(m^2).  Physical Exam  Constitutional: He appears well-developed and well-nourished.  HENT:  Head: Normocephalic and atraumatic.  Eyes: EOM are normal. Left eye exhibits no discharge.  Neck: Normal range of motion. Neck supple.  Cardiovascular: Normal rate and regular rhythm.   Murmur heard.  Systolic murmur is present with a grade of 2/6  Pulmonary/Chest: Effort normal and breath sounds normal. No respiratory distress.  Abdominal: Soft. There is no tenderness.  Musculoskeletal: He exhibits edema (trace edema to bil LE, R>L). He exhibits no tenderness.  Ambulatory with rolling walker, able to rise to standing independently  Neurological: He is alert. He is disoriented.  MMSE 11/08/13 17/30  Skin: Skin is warm and dry.  Psychiatric: He has a normal mood and affect. Thought content normal.     Labs reviewed: No results found for any previous visit.  No results found.   Assessment/Plan  1. Memory loss - consider starting Aricept   2. Essential hypertension -controlled - continue lisinopril, losartan  3. Abnormality of gait - continue PT to increase balance control  4. Edema - stable - continue Lasix  5. History of atrial fibrillation - controlled - continue digoxin and plavix

## 2013-12-06 ENCOUNTER — Other Ambulatory Visit: Payer: Self-pay | Admitting: Nurse Practitioner

## 2013-12-06 DIAGNOSIS — Z8679 Personal history of other diseases of the circulatory system: Secondary | ICD-10-CM

## 2013-12-06 DIAGNOSIS — E039 Hypothyroidism, unspecified: Secondary | ICD-10-CM

## 2013-12-29 ENCOUNTER — Encounter: Payer: Self-pay | Admitting: Nurse Practitioner

## 2013-12-29 ENCOUNTER — Non-Acute Institutional Stay (SKILLED_NURSING_FACILITY): Payer: Medicare Other | Admitting: Nurse Practitioner

## 2013-12-29 DIAGNOSIS — Z8679 Personal history of other diseases of the circulatory system: Secondary | ICD-10-CM

## 2013-12-29 DIAGNOSIS — I10 Essential (primary) hypertension: Secondary | ICD-10-CM

## 2013-12-29 DIAGNOSIS — N400 Enlarged prostate without lower urinary tract symptoms: Secondary | ICD-10-CM

## 2013-12-29 DIAGNOSIS — R269 Unspecified abnormalities of gait and mobility: Secondary | ICD-10-CM

## 2013-12-29 DIAGNOSIS — E039 Hypothyroidism, unspecified: Secondary | ICD-10-CM

## 2013-12-29 DIAGNOSIS — G622 Polyneuropathy due to other toxic agents: Secondary | ICD-10-CM

## 2013-12-29 DIAGNOSIS — R413 Other amnesia: Secondary | ICD-10-CM

## 2013-12-29 DIAGNOSIS — I25700 Atherosclerosis of coronary artery bypass graft(s), unspecified, with unstable angina pectoris: Secondary | ICD-10-CM

## 2013-12-29 DIAGNOSIS — G609 Hereditary and idiopathic neuropathy, unspecified: Secondary | ICD-10-CM

## 2013-12-29 DIAGNOSIS — G619 Inflammatory polyneuropathy, unspecified: Secondary | ICD-10-CM

## 2013-12-29 DIAGNOSIS — R609 Edema, unspecified: Secondary | ICD-10-CM

## 2013-12-29 NOTE — Assessment & Plan Note (Signed)
SNF for care needs.    

## 2013-12-29 NOTE — Assessment & Plan Note (Signed)
Takes Levothyroxine 88mcg daily. 12/02/13 TSH 3.225. 

## 2013-12-29 NOTE — Assessment & Plan Note (Signed)
Not apparent.  

## 2013-12-29 NOTE — Assessment & Plan Note (Signed)
a history of chronic sensory polyneuropathy of undetermined origin with abnormality of gait. He is walking with a rolling walker. Last MRA of the head January 2013 shows no significant stenosis, MRA of the neck shows mild stenosis of the proximal left internal carotid artery.  Takes Metanx, Tylenol, Tramadol, and Cellcept. Ambulates with walker.  F/u Neurology

## 2013-12-29 NOTE — Progress Notes (Signed)
Patient ID: Jesse Burton, male   DOB: August 12, 1925, 80 y.o.   MRN: [id number]   Code Status: full code  Allergies  Allergen Reactions  . Sulfa Antibiotics     Chief Complaint  Patient presents with  . Medical Management of Chronic Issues    HPI: Patient is a 80 y.o. male seen in the SNF at [facility] today for evaluation of  chronic medical conditions.       Problem List Items Addressed This Visit    Memory loss    SNF for care needs.     Inflammatory and toxic neuropathy    Managed with Cellcept     Hypothyroidism    Takes Levothyroxine 35mcg daily. 12/02/13 TSH 3.225    HTN (hypertension)    Takes Losartan 50mg  and Furosemide 20mg /40mg  alternating dose. Controlled.     History of atrial fibrillation    Heart rate is in control, takes Digoxin. Digoxin level 0.6 12/02/13     Hereditary and idiopathic peripheral neuropathy    a history of chronic sensory polyneuropathy of undetermined origin with abnormality of gait. He is walking with a rolling walker. Last MRA of the head January 2013 shows no significant stenosis, MRA of the neck shows mild stenosis of the proximal left internal carotid artery.  Takes Metanx, Tylenol, Tramadol, and Cellcept. Ambulates with walker.  F/u Neurology     Edema    Not apparent    CAD (coronary artery disease) of artery bypass graft    No angina since admitted to SNF [facility]. F/u Cardiology.      Benign prostatic hyperplasia    No urinary retention.     Abnormality of gait - Primary    Continue therapy to maintain his physical functional level.         Review of Systems:  Review of Systems  Constitutional: Negative for fever, chills, weight loss, malaise/fatigue and diaphoresis.  HENT: Positive for hearing loss. Negative for congestion, ear discharge, ear pain, nosebleeds, sore throat and tinnitus.        Mild  Eyes: Negative for blurred vision, double vision, photophobia, pain, discharge and redness.  Respiratory:  Negative for cough, hemoptysis, sputum production, shortness of breath, wheezing and stridor.   Cardiovascular: Negative for chest pain, palpitations, orthopnea, claudication, leg swelling and PND.  Gastrointestinal: Negative for heartburn, nausea, vomiting, abdominal pain, diarrhea, constipation, blood in stool and melena.  Genitourinary: Negative for dysuria, urgency, frequency, hematuria and flank pain.  Musculoskeletal: Positive for joint pain. Negative for myalgias, back pain, falls and neck pain.       Aches in BUE  Skin: Negative for itching and rash.  Neurological: Positive for tingling and sensory change. Negative for dizziness, tremors, speech change, focal weakness, seizures, loss of consciousness, weakness and headaches.  Endo/Heme/Allergies: Negative for environmental allergies and polydipsia. Does not bruise/bleed easily.  Psychiatric/Behavioral: Positive for memory loss. Negative for depression, suicidal ideas, hallucinations and substance abuse. The patient is not nervous/anxious and does not have insomnia.    (type .ros)  Past Medical History  Diagnosis Date  . History of atrial fibrillation   . Thyroid disease   . Memory loss   . Stroke   . 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 Transient cerebral ischemia 07/04/2012    a posterior circulation TIA in October 2009.    Marland Kitchen HTN (hypertension) 12/01/2013  . CAD (coronary artery disease) of artery bypass graft 12/02/2013  . Hypothyroidism 12/01/2013  . Peripheral autonomic neuropathy in disorders classified  elsewhere(337.1) 07/04/2012  . 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 Hereditary and idiopathic peripheral neuropathy 07/04/2012    a history of chronic sensory polyneuropathy of undetermined origin with abnormality of gait. He is walking with a rolling walker. Last MRA of the head January 2013 shows no significant stenosis, MRA of the neck shows mild stenosis of the proximal left internal carotid artery.    .  Abnormality of gait 07/04/2012  . Dizziness and giddiness 07/04/2012  . Benign prostatic hyperplasia   . Edema   . Chorioretinitis   . Panuveitis   . Internal carotid artery stenosis     left  . Mild left ventricular hypertrophy   . Actinic keratoses   . Diverticulitis   . Prostatitis, acute     1991   Past Surgical History  Procedure Laterality Date  . Cardiac catheterization    . Coronary artery bypass graft  1991    grafts to LAD and RCA  . Appendectomy     Social History:   reports that he quit smoking about 35 years ago. He has never used smokeless tobacco. He reports that he does not drink alcohol or use illicit drugs.  Family History  Problem Relation Age of Onset  . Congestive Heart Failure Father   . Pneumonia Mother   . Hypertension Father   . Coronary artery disease Father   . Heart attack Father   . Diabetes Mother   . Diabetes Father   . Cancer Mother     colon  . Hypertension Mother   . Heart failure Father   . Diabetes Sister   . Cancer Sister     breast  . Coronary artery disease Brother   . Heart disease Sister   . Diabetes Sister   . Kidney failure Sister   . Heart attack Sister     Medications: Patient's Medications  New Prescriptions   No medications on file  Previous Medications   ACETAMINOPHEN (TYLENOL) 325 MG TABLET    Take 650 mg by mouth every 6 (six) hours as needed.   ATORVASTATIN (LIPITOR) 40 MG TABLET    Take 40 mg by mouth daily.   AZELASTINE (ASTELIN) 137 MCG/SPRAY NASAL SPRAY       CETIRIZINE (ZYRTEC) 10 MG TABLET    Take 10 mg by mouth daily.   CLOPIDOGREL (PLAVIX) 75 MG TABLET    Take 75 mg by mouth daily.   DIGOXIN (LANOXIN) 0.125 MG TABLET    Take 0.125 mg by mouth daily.   FUROSEMIDE (LASIX) 20 MG TABLET    Take 20 mg by mouth daily. Taking 1 tablet every other day, and 2 pills = 40 mg on the other days   GLUCOSAMINE PO    Take by mouth. Taking 250 daily    L-METHYLFOLATE-B12-B6-B2 (CEREFOLIN PO)    Take by mouth daily.       LEVOTHYROXINE (SYNTHROID, LEVOTHROID) 88 MCG TABLET    Take 88 mcg by mouth daily before breakfast.   LISINOPRIL (PRINIVIL,ZESTRIL) 10 MG TABLET       LOSARTAN (COZAAR) 50 MG TABLET       MULTIPLE VITAMIN (MULTIVITAMIN) TABLET    Take 1 tablet by mouth daily.     MULTIVITAMIN (METANX) 3-35-2 MG TABS TABLET    Take 1 tablet by mouth daily.   MYCOPHENOLATE (CELLCEPT) 500 MG TABLET    Take by mouth 2 (two) times daily.   TAMSULOSIN HCL (FLOMAX) 0.4 MG CAPS    Take by mouth. Takes  two daily    TRAMADOL (ULTRAM) 50 MG TABLET    Take 50 mg by mouth every 6 (six) hours as needed.  Modified Medications   No medications on file  Discontinued Medications   No medications on file     Physical Exam: Physical Exam  Constitutional: He appears well-developed and well-nourished. No distress.  HENT:  Head: Normocephalic and atraumatic.  Right Ear: External ear normal.  Left Ear: External ear normal.  Nose: Nose normal.  Mouth/Throat: Oropharynx is clear and moist. No oropharyngeal exudate.  Eyes: Conjunctivae and EOM are normal. Pupils are equal, round, and reactive to light. Right eye exhibits no discharge. Left eye exhibits no discharge. No scleral icterus.  Neck: Normal range of motion. Neck supple. No JVD present. No tracheal deviation present. No thyromegaly present.  Cardiovascular: Normal rate and regular rhythm.  Exam reveals no gallop and no friction rub.   Murmur heard. Systolic murmur 7-8/4 right sternal border   Pulmonary/Chest: Effort normal and breath sounds normal. No stridor. No respiratory distress. He has no wheezes. He has no rales. He exhibits no tenderness.  Abdominal: Soft. Bowel sounds are normal. He exhibits no distension and no mass. There is no tenderness. There is no rebound and no guarding.  Musculoskeletal: Normal range of motion. He exhibits no edema or tenderness.  Lymphadenopathy:    He has no cervical adenopathy.  Neurological: He is alert. He has normal reflexes.  No cranial nerve deficit. He exhibits normal muscle tone. Coordination normal.  Skin: Skin is warm and dry. No rash noted. He is not diaphoretic. No erythema. No pallor.  Psychiatric: He has a normal mood and affect. His behavior is normal. Judgment and thought content normal. Cognition and memory are impaired. He exhibits abnormal recent memory. He exhibits normal remote memory.    Filed Vitals:   12/29/13 1446  BP: 106/74  Pulse: 66  Temp: 97.1 F (36.2 C)  TempSrc: Tympanic  Resp: 18      Labs reviewed: Basic Metabolic Panel:  Recent Labs  12/02/13  NA 141  K 3.6  BUN 22*  CREATININE 1.0  TSH 3.22   Liver Function Tests:  Recent Labs  12/02/13  AST 28  ALT 32  ALKPHOS 64   No results for input(s): LIPASE, AMYLASE in the last 8760 hours. No results for input(s): AMMONIA in the last 8760 hours. CBC:  Recent Labs  12/02/13  WBC 4.2  HGB 11.4*  HCT 34*  PLT 214   Lipid Panel: No results for input(s): CHOL, HDL, LDLCALC, TRIG, CHOLHDL, LDLDIRECT in the last 8760 hours.  Past Procedures:   01/29/13 MRI brain w/o CM:  IMPRESSION: Generalized atrophy. No acute abnormality   Assessment/Plan Abnormality of gait Continue therapy to maintain his physical functional level.    Benign prostatic hyperplasia No urinary retention.   CAD (coronary artery disease) of artery bypass graft No angina since admitted to SNF [facility]. F/u Cardiology.    Edema Not apparent  Hereditary and idiopathic peripheral neuropathy a history of chronic sensory polyneuropathy of undetermined origin with abnormality of gait. He is walking with a rolling walker. Last MRA of the head January 2013 shows no significant stenosis, MRA of the neck shows mild stenosis of the proximal left internal carotid artery.  Takes Metanx, Tylenol, Tramadol, and Cellcept. Ambulates with walker.  F/u Neurology   History of atrial fibrillation Heart rate is in control, takes Digoxin. Digoxin level  0.6 12/02/13   HTN (hypertension) Takes Losartan 50mg  and  Furosemide 20mg /40mg  alternating dose. Controlled.   Hypothyroidism Takes Levothyroxine 75mcg daily. 12/02/13 TSH 3.225  Inflammatory and toxic neuropathy Managed with Cellcept   Memory loss SNF for care needs.   Transient cerebral ischemia a posterior circulation TIA in October 2009. Takes Plavix and Lipitor for risk reduction.      Family/ Staff Communication: observe the patient  Goals of Care: SNF  Labs/tests ordered: none

## 2013-12-29 NOTE — Assessment & Plan Note (Signed)
No angina since admitted to SNF [facility]. F/u Cardiology.

## 2013-12-29 NOTE — Assessment & Plan Note (Signed)
Managed with Cellcept  

## 2013-12-29 NOTE — Assessment & Plan Note (Signed)
Continue therapy to maintain his physical functional level.

## 2013-12-29 NOTE — Assessment & Plan Note (Signed)
a posterior circulation TIA in October 2009. Takes Plavix and Lipitor for risk reduction.     

## 2013-12-29 NOTE — Assessment & Plan Note (Signed)
Takes Losartan 50mg and Furosemide 20mg/40mg alternating dose. Controlled.    

## 2013-12-29 NOTE — Assessment & Plan Note (Signed)
Heart rate is in control, takes Digoxin. Digoxin level 0.6 12/02/13   

## 2013-12-29 NOTE — Assessment & Plan Note (Signed)
No urinary retention.  

## 2014-01-24 ENCOUNTER — Encounter: Payer: Self-pay | Admitting: Nurse Practitioner

## 2014-01-24 ENCOUNTER — Non-Acute Institutional Stay (SKILLED_NURSING_FACILITY): Payer: Medicare Other | Admitting: Nurse Practitioner

## 2014-01-24 DIAGNOSIS — G459 Transient cerebral ischemic attack, unspecified: Secondary | ICD-10-CM

## 2014-01-24 DIAGNOSIS — Z8679 Personal history of other diseases of the circulatory system: Secondary | ICD-10-CM

## 2014-01-24 DIAGNOSIS — T7840XA Allergy, unspecified, initial encounter: Secondary | ICD-10-CM | POA: Insufficient documentation

## 2014-01-24 DIAGNOSIS — R609 Edema, unspecified: Secondary | ICD-10-CM

## 2014-01-24 DIAGNOSIS — N400 Enlarged prostate without lower urinary tract symptoms: Secondary | ICD-10-CM

## 2014-01-24 DIAGNOSIS — I10 Essential (primary) hypertension: Secondary | ICD-10-CM

## 2014-01-24 DIAGNOSIS — T7840XD Allergy, unspecified, subsequent encounter: Secondary | ICD-10-CM

## 2014-01-24 DIAGNOSIS — E039 Hypothyroidism, unspecified: Secondary | ICD-10-CM

## 2014-01-24 DIAGNOSIS — G909 Disorder of the autonomic nervous system, unspecified: Secondary | ICD-10-CM

## 2014-01-24 DIAGNOSIS — G990 Autonomic neuropathy in diseases classified elsewhere: Secondary | ICD-10-CM

## 2014-01-24 DIAGNOSIS — R413 Other amnesia: Secondary | ICD-10-CM

## 2014-01-24 NOTE — Assessment & Plan Note (Signed)
No urinary retention. Takes Tamsulosin 0.8 mg daily.   

## 2014-01-24 NOTE — Assessment & Plan Note (Signed)
a posterior circulation TIA in October 2009. Takes Plavix and Lipitor for risk reduction.     

## 2014-01-24 NOTE — Assessment & Plan Note (Signed)
Takes Levothyroxine 88mcg daily. 12/02/13 TSH 3.225. 

## 2014-01-24 NOTE — Assessment & Plan Note (Signed)
Takes Losartan 50mg and Furosemide 20mg/40mg alternating dose. Controlled.    

## 2014-01-24 NOTE — Assessment & Plan Note (Addendum)
Symptomatic managed with Zyrtec 10mg daily-change to prn . Prn Astelin bid. Mucinex prn bid  

## 2014-01-24 NOTE — Assessment & Plan Note (Signed)
Heart rate is in control, takes Digoxin. Digoxin level 0.6 12/02/13   

## 2014-01-24 NOTE — Assessment & Plan Note (Addendum)
a history of chronic sensory polyneuropathy of undetermined origin with abnormality of gait. He is walking with a rolling walker. Last MRA of the head January 2013 shows no significant stenosis, MRA of the neck shows mild stenosis of the proximal left internal carotid artery.  Takes Metanx, Tylenol 650mg qam and q6h prn, Tramadol 50mg daily prn, and Cellcept 500mg bid. Ambulates with walker.  F/u Neurology 

## 2014-01-24 NOTE — Assessment & Plan Note (Signed)
Not apparent, takes Furosemide 40mg and 20mg alternating dose.   

## 2014-01-24 NOTE — Progress Notes (Signed)
Patient ID: Jesse Burton, male   DOB: 12-27-1925, 80 y.o.   MRN: [id number]   Code Status: full code  Allergies  Allergen Reactions  . Sulfa Antibiotics     Chief Complaint  Patient presents with  . Medical Management of Chronic Issues    HPI: Patient is a 80 y.o. male seen in the SNF at [facility] today for evaluation of  chronic medical conditions.       Problem List Items Addressed This Visit    Transient cerebral ischemia    a posterior circulation TIA in October 2009. Takes Plavix and Lipitor for risk reduction.       Peripheral autonomic neuropathy in disorders classified elsewhere    a history of chronic sensory polyneuropathy of undetermined origin with abnormality of gait. He is walking with a rolling walker. Last MRA of the head January 2013 shows no significant stenosis, MRA of the neck shows mild stenosis of the proximal left internal carotid artery.  Takes Metanx, Tylenol 650mg  qam and q6h prn, Tramadol 50mg  daily prn, and Cellcept 500mg  bid. Ambulates with walker.  F/u Neurology      Memory loss    SNF for care needs.      Hypothyroidism    Takes Levothyroxine 17mcg daily. 12/02/13 TSH 3.225     HTN (hypertension) - Primary    Takes Losartan 50mg  and Furosemide 20mg /40mg  alternating dose. Controlled.      History of atrial fibrillation    Heart rate is in control, takes Digoxin. Digoxin level 0.6 12/02/13     Edema    Not apparent, takes Furosemide 40mg  and 20mg  alternating dose.      Benign prostatic hyperplasia    No urinary retention. Takes Tamsulosin 0.8 mg daily.     Allergy    Symptomatic managed with Zyrtec 10mg  daily-change to prn . Prn Astelin bid. Mucinex prn bid       Review of Systems:  Review of Systems  Constitutional: Negative for fever, chills, weight loss, malaise/fatigue and diaphoresis.  HENT: Positive for hearing loss. Negative for congestion, ear discharge, ear pain, nosebleeds, sore throat and tinnitus.        Mild  Eyes: Negative for blurred vision, double vision, photophobia, pain, discharge and redness.  Respiratory: Negative for cough, hemoptysis, sputum production, shortness of breath, wheezing and stridor.   Cardiovascular: Negative for chest pain, palpitations, orthopnea, claudication, leg swelling and PND.  Gastrointestinal: Negative for heartburn, nausea, vomiting, abdominal pain, diarrhea, constipation, blood in stool and melena.  Genitourinary: Negative for dysuria, urgency, frequency, hematuria and flank pain.  Musculoskeletal: Positive for joint pain. Negative for myalgias, back pain, falls and neck pain.       Aches in BUE  Skin: Negative for itching and rash.  Neurological: Positive for tingling and sensory change. Negative for dizziness, tremors, speech change, focal weakness, seizures, loss of consciousness, weakness and headaches.  Endo/Heme/Allergies: Negative for environmental allergies and polydipsia. Does not bruise/bleed easily.  Psychiatric/Behavioral: Positive for memory loss. Negative for depression, suicidal ideas, hallucinations and substance abuse. The patient is not nervous/anxious and does not have insomnia.    (type .ros)  Past Medical History  Diagnosis Date  . History of atrial fibrillation   . Thyroid disease   . Memory loss   . Stroke   . 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 Transient cerebral ischemia 07/04/2012    a posterior circulation TIA in October 2009.    Marland Kitchen HTN (hypertension) 12/01/2013  . CAD (coronary  artery disease) of artery bypass graft 12/02/2013  . Hypothyroidism 12/01/2013  . Peripheral autonomic neuropathy in disorders classified elsewhere(337.1) 07/04/2012  . 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 Hereditary and idiopathic peripheral neuropathy 07/04/2012    a history of chronic sensory polyneuropathy of undetermined origin with abnormality of gait. He is walking with a rolling walker. Last MRA of the head January 2013  shows no significant stenosis, MRA of the neck shows mild stenosis of the proximal left internal carotid artery.    . Abnormality of gait 07/04/2012  . Dizziness and giddiness 07/04/2012  . Benign prostatic hyperplasia   . Edema   . Chorioretinitis   . Panuveitis   . Internal carotid artery stenosis     left  . Mild left ventricular hypertrophy   . Actinic keratoses   . Diverticulitis   . Prostatitis, acute     1991   Past Surgical History  Procedure Laterality Date  . Cardiac catheterization    . Coronary artery bypass graft  1991    grafts to LAD and RCA  . Appendectomy     Social History:   reports that he quit smoking about 35 years ago. He has never used smokeless tobacco. He reports that he does not drink alcohol or use illicit drugs.  Family History  Problem Relation Age of Onset  . Congestive Heart Failure Father   . Pneumonia Mother   . Hypertension Father   . Coronary artery disease Father   . Heart attack Father   . Diabetes Mother   . Diabetes Father   . Cancer Mother     colon  . Hypertension Mother   . Heart failure Father   . Diabetes Sister   . Cancer Sister     breast  . Coronary artery disease Brother   . Heart disease Sister   . Diabetes Sister   . Kidney failure Sister   . Heart attack Sister     Medications: Patient's Medications  New Prescriptions   No medications on file  Previous Medications   ACETAMINOPHEN (TYLENOL) 325 MG TABLET    Take 650 mg by mouth every 6 (six) hours as needed.   ATORVASTATIN (LIPITOR) 40 MG TABLET    Take 40 mg by mouth daily.   AZELASTINE (ASTELIN) 137 MCG/SPRAY NASAL SPRAY       CETIRIZINE (ZYRTEC) 10 MG TABLET    Take 10 mg by mouth daily.   CLOPIDOGREL (PLAVIX) 75 MG TABLET    Take 75 mg by mouth daily.   DIGOXIN (LANOXIN) 0.125 MG TABLET    Take 0.125 mg by mouth daily.   FUROSEMIDE (LASIX) 20 MG TABLET    Take 20 mg by mouth daily. Taking 1 tablet every other day, and 2 pills = 40 mg on the other days    GLUCOSAMINE PO    Take by mouth. Taking 250 daily    L-METHYLFOLATE-B12-B6-B2 (CEREFOLIN PO)    Take by mouth daily.     LEVOTHYROXINE (SYNTHROID, LEVOTHROID) 88 MCG TABLET    Take 88 mcg by mouth daily before breakfast.   LISINOPRIL (PRINIVIL,ZESTRIL) 10 MG TABLET       LOSARTAN (COZAAR) 50 MG TABLET       MULTIPLE VITAMIN (MULTIVITAMIN) TABLET    Take 1 tablet by mouth daily.     MULTIVITAMIN (METANX) 3-35-2 MG TABS TABLET    Take 1 tablet by mouth daily.   MYCOPHENOLATE (CELLCEPT) 500 MG TABLET    Take by mouth 2 (  two) times daily.   TAMSULOSIN HCL (FLOMAX) 0.4 MG CAPS    Take by mouth. Takes two daily    TRAMADOL (ULTRAM) 50 MG TABLET    Take 50 mg by mouth every 6 (six) hours as needed.  Modified Medications   No medications on file  Discontinued Medications   No medications on file     Physical Exam: Physical Exam  Constitutional: He appears well-developed and well-nourished. No distress.  HENT:  Head: Normocephalic and atraumatic.  Right Ear: External ear normal.  Left Ear: External ear normal.  Nose: Nose normal.  Mouth/Throat: Oropharynx is clear and moist. No oropharyngeal exudate.  Eyes: Conjunctivae and EOM are normal. Pupils are equal, round, and reactive to light. Right eye exhibits no discharge. Left eye exhibits no discharge. No scleral icterus.  Neck: Normal range of motion. Neck supple. No JVD present. No tracheal deviation present. No thyromegaly present.  Cardiovascular: Normal rate and regular rhythm.  Exam reveals no gallop and no friction rub.   Murmur heard. Systolic murmur 6-2/2 right sternal border   Pulmonary/Chest: Effort normal and breath sounds normal. No stridor. No respiratory distress. He has no wheezes. He has no rales. He exhibits no tenderness.  Abdominal: Soft. Bowel sounds are normal. He exhibits no distension and no mass. There is no tenderness. There is no rebound and no guarding.  Musculoskeletal: Normal range of motion. He exhibits no edema  or tenderness.  Lymphadenopathy:    He has no cervical adenopathy.  Neurological: He is alert. He has normal reflexes. No cranial nerve deficit. He exhibits normal muscle tone. Coordination normal.  Skin: Skin is warm and dry. No rash noted. He is not diaphoretic. No erythema. No pallor.  Psychiatric: He has a normal mood and affect. His behavior is normal. Judgment and thought content normal. Cognition and memory are impaired. He exhibits abnormal recent memory. He exhibits normal remote memory.    Filed Vitals:   01/24/14 1035  BP: 128/66  Pulse: 70  Temp: 98.6 F (37 C)  TempSrc: Tympanic  Resp: 18      Labs reviewed: Basic Metabolic Panel:  Recent Labs  12/02/13  NA 141  K 3.6  BUN 22*  CREATININE 1.0  TSH 3.22   Liver Function Tests:  Recent Labs  12/02/13  AST 28  ALT 32  ALKPHOS 64   No results for input(s): LIPASE, AMYLASE in the last 8760 hours. No results for input(s): AMMONIA in the last 8760 hours. CBC:  Recent Labs  12/02/13  WBC 4.2  HGB 11.4*  HCT 34*  PLT 214   Lipid Panel: No results for input(s): CHOL, HDL, LDLCALC, TRIG, CHOLHDL, LDLDIRECT in the last 8760 hours.  Past Procedures:   01/29/13 MRI brain w/o CM:  IMPRESSION: Generalized atrophy. No acute abnormality   Assessment/Plan Edema Not apparent, takes Furosemide 40mg  and 20mg  alternating dose.    Hypothyroidism Takes Levothyroxine 33mcg daily. 12/02/13 TSH 3.225   Allergy Symptomatic managed with Zyrtec 10mg  daily-change to prn . Prn Astelin bid. Mucinex prn bid  Peripheral autonomic neuropathy in disorders classified elsewhere a history of chronic sensory polyneuropathy of undetermined origin with abnormality of gait. He is walking with a rolling walker. Last MRA of the head January 2013 shows no significant stenosis, MRA of the neck shows mild stenosis of the proximal left internal carotid artery.  Takes Metanx, Tylenol 650mg  qam and q6h prn, Tramadol 50mg  daily  prn, and Cellcept 500mg  bid. Ambulates with walker.  F/u Neurology  HTN (hypertension) Takes Losartan 50mg  and Furosemide 20mg /40mg  alternating dose. Controlled.    Transient cerebral ischemia a posterior circulation TIA in October 2009. Takes Plavix and Lipitor for risk reduction.     History of atrial fibrillation Heart rate is in control, takes Digoxin. Digoxin level 0.6 12/02/13   Memory loss SNF for care needs.    Benign prostatic hyperplasia No urinary retention. Takes Tamsulosin 0.8 mg daily.     Family/ Staff Communication: observe the patient  Goals of Care: SNF  Labs/tests ordered: none

## 2014-01-24 NOTE — Assessment & Plan Note (Signed)
SNF for care needs.    

## 2014-02-23 ENCOUNTER — Non-Acute Institutional Stay (SKILLED_NURSING_FACILITY): Payer: Medicare Other | Admitting: Nurse Practitioner

## 2014-02-23 ENCOUNTER — Encounter: Payer: Self-pay | Admitting: Nurse Practitioner

## 2014-02-23 DIAGNOSIS — R413 Other amnesia: Secondary | ICD-10-CM

## 2014-02-23 DIAGNOSIS — I10 Essential (primary) hypertension: Secondary | ICD-10-CM

## 2014-02-23 DIAGNOSIS — G909 Disorder of the autonomic nervous system, unspecified: Secondary | ICD-10-CM

## 2014-02-23 DIAGNOSIS — N400 Enlarged prostate without lower urinary tract symptoms: Secondary | ICD-10-CM

## 2014-02-23 DIAGNOSIS — Z8679 Personal history of other diseases of the circulatory system: Secondary | ICD-10-CM

## 2014-02-23 DIAGNOSIS — T7840XS Allergy, unspecified, sequela: Secondary | ICD-10-CM

## 2014-02-23 DIAGNOSIS — G450 Vertebro-basilar artery syndrome: Secondary | ICD-10-CM

## 2014-02-23 DIAGNOSIS — E039 Hypothyroidism, unspecified: Secondary | ICD-10-CM

## 2014-02-23 DIAGNOSIS — G609 Hereditary and idiopathic neuropathy, unspecified: Secondary | ICD-10-CM

## 2014-02-23 DIAGNOSIS — G990 Autonomic neuropathy in diseases classified elsewhere: Secondary | ICD-10-CM

## 2014-02-23 DIAGNOSIS — R609 Edema, unspecified: Secondary | ICD-10-CM

## 2014-02-23 DIAGNOSIS — H01003 Unspecified blepharitis right eye, unspecified eyelid: Secondary | ICD-10-CM

## 2014-02-23 NOTE — Progress Notes (Signed)
Patient ID: Jesse Burton, male   DOB: 19-Nov-1925, 81 y.o.   MRN: [id number]   Code Status: full code  Allergies  Allergen Reactions  . Sulfa Antibiotics     Chief Complaint  Patient presents with  . Medical Management of Chronic Issues    HPI: Patient is a 81 y.o. male seen in the SNF at [facility] today for evaluation of  chronic medical conditions.       Problem List Items Addressed This Visit    Transient cerebral ischemia    a posterior circulation TIA in October 2009. Takes Plavix and Lipitor for risk reduction.        Peripheral autonomic neuropathy in disorders classified elsewhere    a history of chronic sensory polyneuropathy of undetermined origin with abnormality of gait. He is walking with a rolling walker. Last MRA of the head January 2013 shows no significant stenosis, MRA of the neck shows mild stenosis of the proximal left internal carotid artery.  Takes Metanx, Tylenol 650mg  qam and q6h prn, Tramadol 50mg  daily prn, and Cellcept 500mg  bid. Ambulates with walker.  F/u Neurology    Memory loss    SNF for care needs.       Hypothyroidism    Takes Levothyroxine 56mcg daily. 12/02/13 TSH 3.225      HTN (hypertension)    Takes Losartan 50mg  and Furosemide 20mg /40mg  alternating dose. Controlled.       History of atrial fibrillation    Heart rate is in control, takes Digoxin. Digoxin level 0.6 12/02/13      Hereditary and idiopathic peripheral neuropathy    a history of chronic sensory polyneuropathy of undetermined origin with abnormality of gait. He is walking with a rolling walker. Last MRA of the head January 2013 shows no significant stenosis, MRA of the neck shows mild stenosis of the proximal left internal carotid artery.  Takes Metanx, Tylenol 650mg  qam and q6h prn, Tramadol 50mg  daily prn, and Cellcept 500mg  bid. Ambulates with walker.  F/u Neurology       Edema    Not apparent, takes Furosemide 40mg  and 20mg  alternating dose.         Blepharitis of eyelid of right eye - Primary    Erythromycin ophthalmic oint 0.5% 1cm ribbon to right conjunctival sac qhs.     Benign prostatic hyperplasia    No urinary retention. Takes Tamsulosin 0.8 mg daily.      Allergy    Symptomatic managed with Zyrtec 10mg  daily-change to prn . Prn Astelin bid. Mucinex prn bid        Review of Systems:  Review of Systems  Constitutional: Negative for fever, chills, weight loss, malaise/fatigue and diaphoresis.  HENT: Positive for hearing loss. Negative for congestion, ear discharge, ear pain, nosebleeds, sore throat and tinnitus.        Mild. Crusty matter right eyelashes.   Eyes: Negative for blurred vision, double vision, photophobia, pain, discharge and redness.  Respiratory: Negative for cough, hemoptysis, sputum production, shortness of breath, wheezing and stridor.   Cardiovascular: Negative for chest pain, palpitations, orthopnea, claudication, leg swelling and PND.  Gastrointestinal: Negative for heartburn, nausea, vomiting, abdominal pain, diarrhea, constipation, blood in stool and melena.  Genitourinary: Negative for dysuria, urgency, frequency, hematuria and flank pain.  Musculoskeletal: Positive for joint pain. Negative for myalgias, back pain, falls and neck pain.       Aches in BUE  Skin: Negative for itching and rash.  Neurological: Positive for tingling and sensory  change. Negative for dizziness, tremors, speech change, focal weakness, seizures, loss of consciousness, weakness and headaches.  Endo/Heme/Allergies: Negative for environmental allergies and polydipsia. Does not bruise/bleed easily.  Psychiatric/Behavioral: Positive for memory loss. Negative for depression, suicidal ideas, hallucinations and substance abuse. The patient is not nervous/anxious and does not have insomnia.    (type .ros)  Past Medical History  Diagnosis Date  . History of atrial fibrillation   . Thyroid disease   . Memory loss   .  Stroke   . 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 Transient cerebral ischemia 07/04/2012    a posterior circulation TIA in October 2009.    Marland Kitchen HTN (hypertension) 12/01/2013  . CAD (coronary artery disease) of artery bypass graft 12/02/2013  . Hypothyroidism 12/01/2013  . Peripheral autonomic neuropathy in disorders classified elsewhere(337.1) 07/04/2012  . 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 Hereditary and idiopathic peripheral neuropathy 07/04/2012    a history of chronic sensory polyneuropathy of undetermined origin with abnormality of gait. He is walking with a rolling walker. Last MRA of the head January 2013 shows no significant stenosis, MRA of the neck shows mild stenosis of the proximal left internal carotid artery.    . Abnormality of gait 07/04/2012  . Dizziness and giddiness 07/04/2012  . Benign prostatic hyperplasia   . Edema   . Chorioretinitis   . Panuveitis   . Internal carotid artery stenosis     left  . Mild left ventricular hypertrophy   . Actinic keratoses   . Diverticulitis   . Prostatitis, acute     1991   Past Surgical History  Procedure Laterality Date  . Cardiac catheterization    . Coronary artery bypass graft  1991    grafts to LAD and RCA  . Appendectomy     Social History:   reports that he quit smoking about 35 years ago. He has never used smokeless tobacco. He reports that he does not drink alcohol or use illicit drugs.  Family History  Problem Relation Age of Onset  . Congestive Heart Failure Father   . Pneumonia Mother   . Hypertension Father   . Coronary artery disease Father   . Heart attack Father   . Diabetes Mother   . Diabetes Father   . Cancer Mother     colon  . Hypertension Mother   . Heart failure Father   . Diabetes Sister   . Cancer Sister     breast  . Coronary artery disease Brother   . Heart disease Sister   . Diabetes Sister   . Kidney failure Sister   . Heart attack Sister     Medications: Patient's  Medications  New Prescriptions   No medications on file  Previous Medications   ACETAMINOPHEN (TYLENOL) 325 MG TABLET    Take 650 mg by mouth every 6 (six) hours as needed.   ATORVASTATIN (LIPITOR) 40 MG TABLET    Take 40 mg by mouth daily.   AZELASTINE (ASTELIN) 137 MCG/SPRAY NASAL SPRAY       CETIRIZINE (ZYRTEC) 10 MG TABLET    Take 10 mg by mouth daily.   CLOPIDOGREL (PLAVIX) 75 MG TABLET    Take 75 mg by mouth daily.   DIGOXIN (LANOXIN) 0.125 MG TABLET    Take 0.125 mg by mouth daily.   FUROSEMIDE (LASIX) 20 MG TABLET    Take 20 mg by mouth daily. Taking 1 tablet every other day, and 2 pills = 40 mg on the  other days   GLUCOSAMINE PO    Take by mouth. Taking 250 daily    L-METHYLFOLATE-B12-B6-B2 (CEREFOLIN PO)    Take by mouth daily.     LEVOTHYROXINE (SYNTHROID, LEVOTHROID) 88 MCG TABLET    Take 88 mcg by mouth daily before breakfast.   LISINOPRIL (PRINIVIL,ZESTRIL) 10 MG TABLET       LOSARTAN (COZAAR) 50 MG TABLET       MULTIPLE VITAMIN (MULTIVITAMIN) TABLET    Take 1 tablet by mouth daily.     MULTIVITAMIN (METANX) 3-35-2 MG TABS TABLET    Take 1 tablet by mouth daily.   MYCOPHENOLATE (CELLCEPT) 500 MG TABLET    Take by mouth 2 (two) times daily.   TAMSULOSIN HCL (FLOMAX) 0.4 MG CAPS    Take by mouth. Takes two daily    TRAMADOL (ULTRAM) 50 MG TABLET    Take 50 mg by mouth every 6 (six) hours as needed.  Modified Medications   No medications on file  Discontinued Medications   No medications on file     Physical Exam: Physical Exam  Constitutional: He appears well-developed and well-nourished. No distress.  HENT:  Head: Normocephalic and atraumatic.  Right Ear: External ear normal.  Left Ear: External ear normal.  Nose: Nose normal.  Mouth/Throat: Oropharynx is clear and moist. No oropharyngeal exudate.  Eyes: Conjunctivae and EOM are normal. Pupils are equal, round, and reactive to light. Right eye exhibits no discharge. Left eye exhibits no discharge. No scleral icterus.   Mild. Crusty matter right eyelashes.    Neck: Normal range of motion. Neck supple. No JVD present. No tracheal deviation present. No thyromegaly present.  Cardiovascular: Normal rate and regular rhythm.  Exam reveals no gallop and no friction rub.   Murmur heard. Systolic murmur 7-7/8 right sternal border   Pulmonary/Chest: Effort normal and breath sounds normal. No stridor. No respiratory distress. He has no wheezes. He has no rales. He exhibits no tenderness.  Abdominal: Soft. Bowel sounds are normal. He exhibits no distension and no mass. There is no tenderness. There is no rebound and no guarding.  Musculoskeletal: Normal range of motion. He exhibits no edema or tenderness.  Lymphadenopathy:    He has no cervical adenopathy.  Neurological: He is alert. He has normal reflexes. No cranial nerve deficit. He exhibits normal muscle tone. Coordination normal.  Skin: Skin is warm and dry. No rash noted. He is not diaphoretic. No erythema. No pallor.  Psychiatric: He has a normal mood and affect. His behavior is normal. Judgment and thought content normal. Cognition and memory are impaired. He exhibits abnormal recent memory. He exhibits normal remote memory.    Filed Vitals:   02/23/14 1616  BP: 108/72  Pulse: 82  Temp: 96.7 F (35.9 C)  TempSrc: Tympanic  Resp: 20      Labs reviewed: Basic Metabolic Panel:  Recent Labs  12/02/13  NA 141  K 3.6  BUN 22*  CREATININE 1.0  TSH 3.22   Liver Function Tests:  Recent Labs  12/02/13  AST 28  ALT 32  ALKPHOS 64   No results for input(s): LIPASE, AMYLASE in the last 8760 hours. No results for input(s): AMMONIA in the last 8760 hours. CBC:  Recent Labs  12/02/13  WBC 4.2  HGB 11.4*  HCT 34*  PLT 214   Lipid Panel: No results for input(s): CHOL, HDL, LDLCALC, TRIG, CHOLHDL, LDLDIRECT in the last 8760 hours.  Past Procedures:   01/29/13 MRI brain w/o CM:  IMPRESSION: Generalized atrophy. No acute  abnormality   Assessment/Plan Blepharitis of eyelid of right eye Erythromycin ophthalmic oint 0.5% 1cm ribbon to right conjunctival sac qhs.   Allergy Symptomatic managed with Zyrtec 10mg  daily-change to prn . Prn Astelin bid. Mucinex prn bid   Benign prostatic hyperplasia No urinary retention. Takes Tamsulosin 0.8 mg daily.    Edema Not apparent, takes Furosemide 40mg  and 20mg  alternating dose.     Hereditary and idiopathic peripheral neuropathy a history of chronic sensory polyneuropathy of undetermined origin with abnormality of gait. He is walking with a rolling walker. Last MRA of the head January 2013 shows no significant stenosis, MRA of the neck shows mild stenosis of the proximal left internal carotid artery.  Takes Metanx, Tylenol 650mg  qam and q6h prn, Tramadol 50mg  daily prn, and Cellcept 500mg  bid. Ambulates with walker.  F/u Neurology     History of atrial fibrillation Heart rate is in control, takes Digoxin. Digoxin level 0.6 12/02/13    HTN (hypertension) Takes Losartan 50mg  and Furosemide 20mg /40mg  alternating dose. Controlled.     Hypothyroidism Takes Levothyroxine 69mcg daily. 12/02/13 TSH 3.225    Memory loss SNF for care needs.     Peripheral autonomic neuropathy in disorders classified elsewhere a history of chronic sensory polyneuropathy of undetermined origin with abnormality of gait. He is walking with a rolling walker. Last MRA of the head January 2013 shows no significant stenosis, MRA of the neck shows mild stenosis of the proximal left internal carotid artery.  Takes Metanx, Tylenol 650mg  qam and q6h prn, Tramadol 50mg  daily prn, and Cellcept 500mg  bid. Ambulates with walker.  F/u Neurology  Transient cerebral ischemia a posterior circulation TIA in October 2009. Takes Plavix and Lipitor for risk reduction.        Family/ Staff Communication: observe the patient  Goals of Care: SNF  Labs/tests ordered: none

## 2014-02-24 DIAGNOSIS — H01003 Unspecified blepharitis right eye, unspecified eyelid: Secondary | ICD-10-CM | POA: Insufficient documentation

## 2014-02-24 NOTE — Assessment & Plan Note (Signed)
a history of chronic sensory polyneuropathy of undetermined origin with abnormality of gait. He is walking with a rolling walker. Last MRA of the head January 2013 shows no significant stenosis, MRA of the neck shows mild stenosis of the proximal left internal carotid artery.  Takes Metanx, Tylenol 650mg  qam and q6h prn, Tramadol 50mg  daily prn, and Cellcept 500mg  bid. Ambulates with walker.  F/u Neurology

## 2014-02-24 NOTE — Assessment & Plan Note (Signed)
Heart rate is in control, takes Digoxin. Digoxin level 0.6 12/02/13

## 2014-02-24 NOTE — Assessment & Plan Note (Signed)
a posterior circulation TIA in October 2009. Takes Plavix and Lipitor for risk reduction.

## 2014-02-24 NOTE — Assessment & Plan Note (Signed)
Symptomatic managed with Zyrtec 10mg  daily-change to prn . Prn Astelin bid. Mucinex prn bid

## 2014-02-24 NOTE — Assessment & Plan Note (Signed)
Erythromycin ophthalmic oint 0.5% 1cm ribbon to right conjunctival sac qhs.

## 2014-02-24 NOTE — Assessment & Plan Note (Signed)
SNF for care needs.

## 2014-02-24 NOTE — Assessment & Plan Note (Signed)
Not apparent, takes Furosemide 40mg  and 20mg  alternating dose.

## 2014-02-24 NOTE — Assessment & Plan Note (Signed)
No urinary retention. Takes Tamsulosin 0.8 mg daily.

## 2014-02-24 NOTE — Assessment & Plan Note (Signed)
Takes Levothyroxine 69mcg daily. 12/02/13 TSH 3.225

## 2014-02-24 NOTE — Assessment & Plan Note (Signed)
Takes Losartan 50mg  and Furosemide 20mg /40mg  alternating dose. Controlled.

## 2014-02-28 ENCOUNTER — Non-Acute Institutional Stay (SKILLED_NURSING_FACILITY): Payer: Medicare Other | Admitting: Nurse Practitioner

## 2014-02-28 DIAGNOSIS — I10 Essential (primary) hypertension: Secondary | ICD-10-CM

## 2014-02-28 DIAGNOSIS — Z8679 Personal history of other diseases of the circulatory system: Secondary | ICD-10-CM

## 2014-02-28 DIAGNOSIS — H01003 Unspecified blepharitis right eye, unspecified eyelid: Secondary | ICD-10-CM

## 2014-02-28 DIAGNOSIS — G609 Hereditary and idiopathic neuropathy, unspecified: Secondary | ICD-10-CM

## 2014-02-28 DIAGNOSIS — E039 Hypothyroidism, unspecified: Secondary | ICD-10-CM

## 2014-02-28 DIAGNOSIS — R413 Other amnesia: Secondary | ICD-10-CM

## 2014-02-28 DIAGNOSIS — G459 Transient cerebral ischemic attack, unspecified: Secondary | ICD-10-CM

## 2014-02-28 DIAGNOSIS — N400 Enlarged prostate without lower urinary tract symptoms: Secondary | ICD-10-CM

## 2014-02-28 DIAGNOSIS — R609 Edema, unspecified: Secondary | ICD-10-CM

## 2014-02-28 NOTE — Assessment & Plan Note (Signed)
No urinary retention. Takes Tamsulosin 0.8 mg daily.

## 2014-02-28 NOTE — Assessment & Plan Note (Signed)
Takes Losartan 50mg  and Furosemide 20mg /40mg  alternating dose. Controlled.

## 2014-02-28 NOTE — Assessment & Plan Note (Signed)
Heart rate is in control, takes Digoxin. Digoxin level 0.6 12/02/13. Update digoxin level.

## 2014-02-28 NOTE — Assessment & Plan Note (Signed)
Not apparent, takes Furosemide 40mg  and 20mg  alternating dose. Update CMP

## 2014-02-28 NOTE — Assessment & Plan Note (Signed)
Improving.

## 2014-02-28 NOTE — Assessment & Plan Note (Signed)
Takes Levothyroxine 32mcg daily. 12/02/13 TSH 3.225. Update TSH

## 2014-02-28 NOTE — Progress Notes (Signed)
Patient ID: Jesse Burton, male   DOB: [birth year], 81 y.o.   MRN: [id number]   Code Status: full code  Allergies  Allergen Reactions  . Sulfa Antibiotics     Chief Complaint  Patient presents with  . Medical Management of Chronic Issues  . Acute Visit    increased confusion.     HPI: Patient is a 81 y.o. male seen in the SNF at [facility] today for evaluation of increased confusion and chronic medical conditions.       Problem List Items Addressed This Visit    Transient cerebral ischemia    a posterior circulation TIA in October 2009. Takes Plavix and Lipitor for risk reduction. Continue to observe.      Memory loss - Primary    SNF for care needs. Increased confusion. Repeat MMSE, obtain CBC, CMP, Digoxin level, UA C/S     Hypothyroidism    Takes Levothyroxine 81mcg daily. 12/02/13 TSH 3.225. Update TSH      HTN (hypertension)    Takes Losartan 50mg  and Furosemide 20mg /40mg  alternating dose. Controlled.      History of atrial fibrillation    Heart rate is in control, takes Digoxin. Digoxin level 0.6 12/02/13. Update digoxin level.       Hereditary and idiopathic peripheral neuropathy    a history of chronic sensory polyneuropathy of undetermined origin with abnormality of gait. He is walking with a rolling walker. Last MRA of the head January 2013 shows no significant stenosis, MRA of the neck shows mild stenosis of the proximal left internal carotid artery.  Takes Metanx, Tylenol 650mg  qam and q6h prn, Tramadol 50mg  bid, and Cellcept 500mg  bid. Ambulates with walker.  F/u Neurology     Edema    Not apparent, takes Furosemide 40mg  and 20mg  alternating dose. Update CMP     Blepharitis of eyelid of right eye    Improving.     Benign prostatic hyperplasia    No urinary retention. Takes Tamsulosin 0.8 mg daily.          Review of Systems:  Review of Systems  Constitutional: Negative for fever, chills, weight loss, malaise/fatigue and  diaphoresis.  HENT: Positive for hearing loss. Negative for congestion, ear discharge, ear pain, nosebleeds, sore throat and tinnitus.        Mild. Crusty matter right eyelashes.   Eyes: Negative for blurred vision, double vision, photophobia, pain, discharge and redness.  Respiratory: Negative for cough, hemoptysis, sputum production, shortness of breath, wheezing and stridor.   Cardiovascular: Negative for chest pain, palpitations, orthopnea, claudication, leg swelling and PND.  Gastrointestinal: Negative for heartburn, nausea, vomiting, abdominal pain, diarrhea, constipation, blood in stool and melena.  Genitourinary: Negative for dysuria, urgency, frequency, hematuria and flank pain.  Musculoskeletal: Positive for joint pain. Negative for myalgias, back pain, falls and neck pain.       Aches in BUE  Skin: Negative for itching and rash.  Neurological: Positive for tingling and sensory change. Negative for dizziness, tremors, speech change, focal weakness, seizures, loss of consciousness, weakness and headaches.  Endo/Heme/Allergies: Negative for environmental allergies and polydipsia. Does not bruise/bleed easily.  Psychiatric/Behavioral: Positive for memory loss. Negative for depression, suicidal ideas, hallucinations and substance abuse. The patient is not nervous/anxious and does not have insomnia.    (type .ros)  Past Medical History  Diagnosis Date  . History of atrial fibrillation   . Thyroid disease   . Memory loss   . Stroke   . 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 Transient cerebral ischemia 07/04/2012    a posterior circulation TIA in October 2009.    Marland Kitchen HTN (hypertension) 12/01/2013  . CAD (coronary artery disease) of artery bypass graft 12/02/2013  . Hypothyroidism 12/01/2013  . Peripheral autonomic neuropathy in disorders classified elsewhere(337.1) 07/04/2012  . 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 Hereditary and idiopathic peripheral neuropathy  07/04/2012    a history of chronic sensory polyneuropathy of undetermined origin with abnormality of gait. He is walking with a rolling walker. Last MRA of the head January 2013 shows no significant stenosis, MRA of the neck shows mild stenosis of the proximal left internal carotid artery.    . Abnormality of gait 07/04/2012  . Dizziness and giddiness 07/04/2012  . Benign prostatic hyperplasia   . Edema   . Chorioretinitis   . Panuveitis   . Internal carotid artery stenosis     left  . Mild left ventricular hypertrophy   . Actinic keratoses   . Diverticulitis   . Prostatitis, acute     1991   Past Surgical History  Procedure Laterality Date  . Cardiac catheterization    . Coronary artery bypass graft  1991    grafts to LAD and RCA  . Appendectomy     Social History:   reports that he quit smoking about 35 years ago. He has never used smokeless tobacco. He reports that he does not drink alcohol or use illicit drugs.  Family History  Problem Relation Age of Onset  . Congestive Heart Failure Father   . Pneumonia Mother   . Hypertension Father   . Coronary artery disease Father   . Heart attack Father   . Diabetes Mother   . Diabetes Father   . Cancer Mother     colon  . Hypertension Mother   . Heart failure Father   . Diabetes Sister   . Cancer Sister     breast  . Coronary artery disease Brother   . Heart disease Sister   . Diabetes Sister   . Kidney failure Sister   . Heart attack Sister     Medications: Patient's Medications  New Prescriptions   No medications on file  Previous Medications   ACETAMINOPHEN (TYLENOL) 325 MG TABLET    Take 650 mg by mouth every 6 (six) hours as needed.   ATORVASTATIN (LIPITOR) 40 MG TABLET    Take 40 mg by mouth daily.   AZELASTINE (ASTELIN) 137 MCG/SPRAY NASAL SPRAY       CETIRIZINE (ZYRTEC) 10 MG TABLET    Take 10 mg by mouth daily.   CLOPIDOGREL (PLAVIX) 75 MG TABLET    Take 75 mg by mouth daily.   DIGOXIN (LANOXIN) 0.125 MG  TABLET    Take 0.125 mg by mouth daily.   FUROSEMIDE (LASIX) 20 MG TABLET    Take 20 mg by mouth daily. Taking 1 tablet every other day, and 2 pills = 40 mg on the other days   GLUCOSAMINE PO    Take by mouth. Taking 250 daily    L-METHYLFOLATE-B12-B6-B2 (CEREFOLIN PO)    Take by mouth daily.     LEVOTHYROXINE (SYNTHROID, LEVOTHROID) 88 MCG TABLET    Take 88 mcg by mouth daily before breakfast.   LISINOPRIL (PRINIVIL,ZESTRIL) 10 MG TABLET       LOSARTAN (COZAAR) 50 MG TABLET       MULTIPLE VITAMIN (MULTIVITAMIN) TABLET    Take 1 tablet by mouth daily.  MULTIVITAMIN (METANX) 3-35-2 MG TABS TABLET    Take 1 tablet by mouth daily.   MYCOPHENOLATE (CELLCEPT) 500 MG TABLET    Take by mouth 2 (two) times daily.   TAMSULOSIN HCL (FLOMAX) 0.4 MG CAPS    Take by mouth. Takes two daily    TRAMADOL (ULTRAM) 50 MG TABLET    Take 50 mg by mouth every 6 (six) hours as needed.  Modified Medications   No medications on file  Discontinued Medications   No medications on file     Physical Exam: Physical Exam  Constitutional: He appears well-developed and well-nourished. No distress.  HENT:  Head: Normocephalic and atraumatic.  Right Ear: External ear normal.  Left Ear: External ear normal.  Nose: Nose normal.  Mouth/Throat: Oropharynx is clear and moist. No oropharyngeal exudate.  Eyes: Conjunctivae and EOM are normal. Pupils are equal, round, and reactive to light. Right eye exhibits no discharge. Left eye exhibits no discharge. No scleral icterus.  Mild. Crusty matter right eyelashes.    Neck: Normal range of motion. Neck supple. No JVD present. No tracheal deviation present. No thyromegaly present.  Cardiovascular: Normal rate and regular rhythm.  Exam reveals no gallop and no friction rub.   Murmur heard. Systolic murmur 7-0/6 right sternal border   Pulmonary/Chest: Effort normal and breath sounds normal. No stridor. No respiratory distress. He has no wheezes. He has no rales. He exhibits no  tenderness.  Abdominal: Soft. Bowel sounds are normal. He exhibits no distension and no mass. There is no tenderness. There is no rebound and no guarding.  Musculoskeletal: Normal range of motion. He exhibits no edema or tenderness.  Lymphadenopathy:    He has no cervical adenopathy.  Neurological: He is alert. He has normal reflexes. No cranial nerve deficit. He exhibits normal muscle tone. Coordination normal.  Skin: Skin is warm and dry. No rash noted. He is not diaphoretic. No erythema. No pallor.  Psychiatric: He has a normal mood and affect. His behavior is normal. Judgment and thought content normal. Cognition and memory are impaired. He exhibits abnormal recent memory. He exhibits normal remote memory.    Filed Vitals:   02/28/14 0928  BP: 130/70  Pulse: 78  Temp: 98.7 F (37.1 C)  TempSrc: Tympanic  Resp: 20      Labs reviewed: Basic Metabolic Panel:  Recent Labs  12/02/13  NA 141  K 3.6  BUN 22*  CREATININE 1.0  TSH 3.22   Liver Function Tests:  Recent Labs  12/02/13  AST 28  ALT 32  ALKPHOS 64   No results for input(s): LIPASE, AMYLASE in the last 8760 hours. No results for input(s): AMMONIA in the last 8760 hours. CBC:  Recent Labs  12/02/13  WBC 4.2  HGB 11.4*  HCT 34*  PLT 214   Lipid Panel: No results for input(s): CHOL, HDL, LDLCALC, TRIG, CHOLHDL, LDLDIRECT in the last 8760 hours.  Past Procedures:   01/29/13 MRI brain w/o CM:  IMPRESSION: Generalized atrophy. No acute abnormality   Assessment/Plan Memory loss SNF for care needs. Increased confusion. Repeat MMSE, obtain CBC, CMP, Digoxin level, UA C/S   Benign prostatic hyperplasia No urinary retention. Takes Tamsulosin 0.8 mg daily.     Blepharitis of eyelid of right eye Improving.   Edema Not apparent, takes Furosemide 40mg  and 20mg  alternating dose. Update CMP   Hereditary and idiopathic peripheral neuropathy a history of chronic sensory polyneuropathy of  undetermined origin with abnormality of gait. He is walking with  a rolling walker. Last MRA of the head January 2013 shows no significant stenosis, MRA of the neck shows mild stenosis of the proximal left internal carotid artery.  Takes Metanx, Tylenol 650mg  qam and q6h prn, Tramadol 50mg  bid, and Cellcept 500mg  bid. Ambulates with walker.  F/u Neurology   History of atrial fibrillation Heart rate is in control, takes Digoxin. Digoxin level 0.6 12/02/13. Update digoxin level.     HTN (hypertension) Takes Losartan 50mg  and Furosemide 20mg /40mg  alternating dose. Controlled.    Hypothyroidism Takes Levothyroxine 21mcg daily. 12/02/13 TSH 3.225. Update TSH    Transient cerebral ischemia a posterior circulation TIA in October 2009. Takes Plavix and Lipitor for risk reduction. Continue to observe.      Family/ Staff Communication: observe the patient  Goals of Care: SNF  Labs/tests ordered: MMSE, CBC, CMP, Digoxin level.

## 2014-02-28 NOTE — Assessment & Plan Note (Signed)
a posterior circulation TIA in October 2009. Takes Plavix and Lipitor for risk reduction. Continue to observe.

## 2014-02-28 NOTE — Assessment & Plan Note (Signed)
a history of chronic sensory polyneuropathy of undetermined origin with abnormality of gait. He is walking with a rolling walker. Last MRA of the head January 2013 shows no significant stenosis, MRA of the neck shows mild stenosis of the proximal left internal carotid artery.  Takes Metanx, Tylenol 650mg  qam and q6h prn, Tramadol 50mg  bid, and Cellcept 500mg  bid. Ambulates with walker.  F/u Neurology

## 2014-02-28 NOTE — Assessment & Plan Note (Signed)
SNF for care needs. Increased confusion. Repeat MMSE, obtain CBC, CMP, Digoxin level, UA C/S

## 2014-03-01 LAB — CBC AND DIFFERENTIAL
HEMATOCRIT: 36 % — AB (ref 41–53)
HEMOGLOBIN: 11.7 g/dL — AB (ref 13.5–17.5)
PLATELETS: 419 10*3/uL — AB (ref 150–399)
WBC: 11.4 10*3/mL

## 2014-03-01 LAB — HEPATIC FUNCTION PANEL
ALT: 60 U/L — AB (ref 10–40)
AST: 42 U/L — AB (ref 14–40)
Alkaline Phosphatase: 70 U/L (ref 25–125)
BILIRUBIN, TOTAL: 0.7 mg/dL

## 2014-03-01 LAB — BASIC METABOLIC PANEL
BUN: 29 mg/dL — AB (ref 4–21)
CREATININE: 0.8 mg/dL (ref 0.6–1.3)
GLUCOSE: 96 mg/dL
Potassium: 4.1 mmol/L (ref 3.4–5.3)
Sodium: 144 mmol/L (ref 137–147)

## 2014-03-02 ENCOUNTER — Other Ambulatory Visit: Payer: Self-pay | Admitting: Nurse Practitioner

## 2014-03-30 ENCOUNTER — Encounter: Payer: Self-pay | Admitting: Nurse Practitioner

## 2014-03-30 ENCOUNTER — Non-Acute Institutional Stay (SKILLED_NURSING_FACILITY): Payer: Medicare Other | Admitting: Nurse Practitioner

## 2014-03-30 DIAGNOSIS — G619 Inflammatory polyneuropathy, unspecified: Secondary | ICD-10-CM

## 2014-03-30 DIAGNOSIS — E039 Hypothyroidism, unspecified: Secondary | ICD-10-CM

## 2014-03-30 DIAGNOSIS — Z8679 Personal history of other diseases of the circulatory system: Secondary | ICD-10-CM

## 2014-03-30 DIAGNOSIS — H01003 Unspecified blepharitis right eye, unspecified eyelid: Secondary | ICD-10-CM

## 2014-03-30 DIAGNOSIS — G609 Hereditary and idiopathic neuropathy, unspecified: Secondary | ICD-10-CM

## 2014-03-30 DIAGNOSIS — R413 Other amnesia: Secondary | ICD-10-CM

## 2014-03-30 DIAGNOSIS — R609 Edema, unspecified: Secondary | ICD-10-CM

## 2014-03-30 DIAGNOSIS — G622 Polyneuropathy due to other toxic agents: Secondary | ICD-10-CM

## 2014-03-30 DIAGNOSIS — N400 Enlarged prostate without lower urinary tract symptoms: Secondary | ICD-10-CM

## 2014-03-30 DIAGNOSIS — G990 Autonomic neuropathy in diseases classified elsewhere: Secondary | ICD-10-CM

## 2014-03-30 DIAGNOSIS — I25700 Atherosclerosis of coronary artery bypass graft(s), unspecified, with unstable angina pectoris: Secondary | ICD-10-CM

## 2014-03-30 DIAGNOSIS — I10 Essential (primary) hypertension: Secondary | ICD-10-CM

## 2014-03-30 DIAGNOSIS — G909 Disorder of the autonomic nervous system, unspecified: Secondary | ICD-10-CM

## 2014-03-30 NOTE — Assessment & Plan Note (Signed)
Not apparent, takes Furosemide 40mg  and 20mg  alternating dose.

## 2014-03-30 NOTE — Assessment & Plan Note (Signed)
a history of chronic sensory polyneuropathy of undetermined origin with abnormality of gait. He is walking with a rolling walker. Last MRA of the head January 2013 shows no significant stenosis, MRA of the neck shows mild stenosis of the proximal left internal carotid artery.  Takes Metanx, Tylenol 650mg  qam and q6h prn, Tramadol 50mg  bid, and Cellcept 500mg  bid. Ambulates with walker.  F/u Neurology Takes Tylenol 1000 mg daily and Tramadol 50mg  bid

## 2014-03-30 NOTE — Assessment & Plan Note (Signed)
Managed with Cellcept

## 2014-03-30 NOTE — Assessment & Plan Note (Signed)
Takes Losartan 50mg  and Furosemide 20mg /40mg  alternating dose. Controlled blood pressure.

## 2014-03-30 NOTE — Progress Notes (Signed)
Patient ID: Jesse Burton, male   DOB: 05/25/1925, 81 y.o.   MRN: [id number]   Code Status: full code  Allergies  Allergen Reactions  . Sulfa Antibiotics     Chief Complaint  Patient presents with  . Medical Management of Chronic Issues    HPI: Patient is a 81 y.o. male seen in the SNF at [facility] today for evaluation of chronic medical conditions.       Problem List Items Addressed This Visit    Peripheral autonomic neuropathy in disorders classified elsewhere    a history of chronic sensory polyneuropathy of undetermined origin with abnormality of gait. He is walking with a rolling walker. Last MRA of the head January 2013 shows no significant stenosis, MRA of the neck shows mild stenosis of the proximal left internal carotid artery.  Takes Metanx, Tylenol 650mg  qam and q6h prn, Tramadol 50mg  bid, and Cellcept 500mg  bid. Ambulates with walker.  F/u Neurology Takes Tylenol 1000 mg daily and Tramadol 50mg  bid       Memory loss - Primary    SNF for care needs.  11/08/13 MMSE 17/30 03/01/14 MMSE 14/30 Recommend [facility].         Inflammatory and toxic neuropathy    Managed with Cellcept       Hypothyroidism    Takes Levothyroxine 76mcg daily. 12/02/13 TSH 3.225.      HTN (hypertension)    Takes Losartan 50mg  and Furosemide 20mg /40mg  alternating dose. Controlled blood pressure.         History of atrial fibrillation    Heart rate is in control, takes Digoxin 154mcg. Digoxin level 0.6 12/02/13        Hereditary and idiopathic peripheral neuropathy    a history of chronic sensory polyneuropathy of undetermined origin with abnormality of gait. He is walking with a rolling walker. Last MRA of the head January 2013 shows no significant stenosis, MRA of the neck shows mild stenosis of the proximal left internal carotid artery.  Takes Metanx, Tylenol 650mg  qam and q6h prn, Tramadol 50mg  bid, and Cellcept 500mg  bid. Ambulates with walker.  F/u Neurology      Edema    Not apparent, takes Furosemide 40mg  and 20mg  alternating dose.       CAD (coronary artery disease) of artery bypass graft    No angina since admitted to SNF [facility]. F/u Cardiology. Continue Plavix and Atorvastatin for risk reduction.        Blepharitis of eyelid of right eye    Improving. Continue Erythromycin ointment nightly.        Benign prostatic hyperplasia    No urinary retention. Takes Tamsulosin 0.8 mg daily.           Review of Systems:  Review of Systems  Constitutional: Negative for fever, chills, weight loss, malaise/fatigue and diaphoresis.  HENT: Positive for hearing loss. Negative for congestion, ear discharge, ear pain, nosebleeds, sore throat and tinnitus.   Eyes: Negative for blurred vision, double vision, photophobia, pain, discharge and redness.  Respiratory: Negative for cough, hemoptysis, sputum production, shortness of breath, wheezing and stridor.   Cardiovascular: Positive for leg swelling. Negative for chest pain, palpitations, orthopnea, claudication and PND.       Trace only.   Gastrointestinal: Negative for heartburn, nausea, vomiting, abdominal pain, diarrhea, constipation, blood in stool and melena.  Genitourinary: Positive for frequency. Negative for dysuria, urgency, hematuria and flank pain.  Musculoskeletal: Positive for back pain. Negative for myalgias, joint pain, falls and neck  pain.       Ambulates with walker for short distance and w/c to go further.   Skin: Negative for itching and rash.  Neurological: Positive for sensory change. Negative for dizziness, tingling, tremors, speech change, focal weakness, seizures, loss of consciousness, weakness and headaches.  Endo/Heme/Allergies: Negative for environmental allergies and polydipsia. Does not bruise/bleed easily.  Psychiatric/Behavioral: Positive for depression and memory loss. Negative for suicidal ideas, hallucinations and substance abuse. The patient is nervous/anxious and has  insomnia.    (type .ros)  Past Medical History  Diagnosis Date  . History of atrial fibrillation   . Thyroid disease   . Memory loss   . Stroke   . 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 Transient cerebral ischemia 07/04/2012    a posterior circulation TIA in October 2009.    Marland Kitchen HTN (hypertension) 12/01/2013  . CAD (coronary artery disease) of artery bypass graft 12/02/2013  . Hypothyroidism 12/01/2013  . Peripheral autonomic neuropathy in disorders classified elsewhere(337.1) 07/04/2012  . 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 Hereditary and idiopathic peripheral neuropathy 07/04/2012    a history of chronic sensory polyneuropathy of undetermined origin with abnormality of gait. He is walking with a rolling walker. Last MRA of the head January 2013 shows no significant stenosis, MRA of the neck shows mild stenosis of the proximal left internal carotid artery.    . Abnormality of gait 07/04/2012  . Dizziness and giddiness 07/04/2012  . Benign prostatic hyperplasia   . Edema   . Chorioretinitis   . Panuveitis   . Internal carotid artery stenosis     left  . Mild left ventricular hypertrophy   . Actinic keratoses   . Diverticulitis   . Prostatitis, acute     1991   Past Surgical History  Procedure Laterality Date  . Cardiac catheterization    . Coronary artery bypass graft  1991    grafts to LAD and RCA  . Appendectomy     Social History:   reports that he quit smoking about 35 years ago. He has never used smokeless tobacco. He reports that he does not drink alcohol or use illicit drugs.  Family History  Problem Relation Age of Onset  . Congestive Heart Failure Father   . Pneumonia Mother   . Hypertension Father   . Coronary artery disease Father   . Heart attack Father   . Diabetes Mother   . Diabetes Father   . Cancer Mother     colon  . Hypertension Mother   . Heart failure Father   . Diabetes Sister   . Cancer Sister     breast  . Coronary artery  disease Brother   . Heart disease Sister   . Diabetes Sister   . Kidney failure Sister   . Heart attack Sister     Medications: Patient's Medications  New Prescriptions   No medications on file  Previous Medications   ACETAMINOPHEN (TYLENOL) 325 MG TABLET    Take 650 mg by mouth every 6 (six) hours as needed.   ATORVASTATIN (LIPITOR) 40 MG TABLET    Take 40 mg by mouth daily.   AZELASTINE (ASTELIN) 137 MCG/SPRAY NASAL SPRAY       CETIRIZINE (ZYRTEC) 10 MG TABLET    Take 10 mg by mouth daily.   CLOPIDOGREL (PLAVIX) 75 MG TABLET    Take 75 mg by mouth daily.   DIGOXIN (LANOXIN) 0.125 MG TABLET    Take 0.125 mg by mouth daily.  FUROSEMIDE (LASIX) 20 MG TABLET    Take 20 mg by mouth daily. Taking 1 tablet every other day, and 2 pills = 40 mg on the other days   GLUCOSAMINE PO    Take by mouth. Taking 250 daily    L-METHYLFOLATE-B12-B6-B2 (CEREFOLIN PO)    Take by mouth daily.     LEVOTHYROXINE (SYNTHROID, LEVOTHROID) 88 MCG TABLET    Take 88 mcg by mouth daily before breakfast.   LISINOPRIL (PRINIVIL,ZESTRIL) 10 MG TABLET       LOSARTAN (COZAAR) 50 MG TABLET       MULTIPLE VITAMIN (MULTIVITAMIN) TABLET    Take 1 tablet by mouth daily.     MULTIVITAMIN (METANX) 3-35-2 MG TABS TABLET    Take 1 tablet by mouth daily.   MYCOPHENOLATE (CELLCEPT) 500 MG TABLET    Take by mouth 2 (two) times daily.   TAMSULOSIN HCL (FLOMAX) 0.4 MG CAPS    Take by mouth. Takes two daily    TRAMADOL (ULTRAM) 50 MG TABLET    Take 50 mg by mouth every 6 (six) hours as needed.  Modified Medications   No medications on file  Discontinued Medications   No medications on file     Physical Exam: Physical Exam  Constitutional: He appears well-developed and well-nourished. No distress.  HENT:  Head: Normocephalic and atraumatic.  Right Ear: External ear normal.  Left Ear: External ear normal.  Nose: Nose normal.  Mouth/Throat: Oropharynx is clear and moist. No oropharyngeal exudate.  Eyes: Conjunctivae and  EOM are normal. Pupils are equal, round, and reactive to light. Right eye exhibits no discharge. Left eye exhibits no discharge. No scleral icterus.  Mild. Crusty matter right eyelashes.    Neck: Normal range of motion. Neck supple. No JVD present. No tracheal deviation present. No thyromegaly present.  Cardiovascular: Normal rate and regular rhythm.  Exam reveals no gallop and no friction rub.   Murmur heard. Systolic murmur 1-4/7 right sternal border   Pulmonary/Chest: Effort normal and breath sounds normal. No stridor. No respiratory distress. He has no wheezes. He has no rales. He exhibits no tenderness.  Abdominal: Soft. Bowel sounds are normal. He exhibits no distension and no mass. There is no tenderness. There is no rebound and no guarding.  Musculoskeletal: Normal range of motion. He exhibits no edema or tenderness.  Lymphadenopathy:    He has no cervical adenopathy.  Neurological: He is alert. He has normal reflexes. No cranial nerve deficit. He exhibits normal muscle tone. Coordination normal.  Skin: Skin is warm and dry. No rash noted. He is not diaphoretic. No erythema. No pallor.  Psychiatric: He has a normal mood and affect. His behavior is normal. Judgment and thought content normal. Cognition and memory are impaired. He exhibits abnormal recent memory. He exhibits normal remote memory.    Filed Vitals:   03/30/14 1135  BP: 140/64  Pulse: 76  Temp: 98.6 F (37 C)  TempSrc: Tympanic  Resp: 20      Labs reviewed: Basic Metabolic Panel:  Recent Labs  12/02/13 03/01/14  NA 141 144  K 3.6 4.1  BUN 22* 29*  CREATININE 1.0 0.8  TSH 3.22  --    Liver Function Tests:  Recent Labs  12/02/13 03/01/14  AST 28 42*  ALT 32 60*  ALKPHOS 64 70   No results for input(s): LIPASE, AMYLASE in the last 8760 hours. No results for input(s): AMMONIA in the last 8760 hours. CBC:  Recent Labs  12/02/13 03/01/14  WBC 4.2 11.4  HGB 11.4* 11.7*  HCT 34* 36*  PLT 214 419*     Lipid Panel: No results for input(s): CHOL, HDL, LDLCALC, TRIG, CHOLHDL, LDLDIRECT in the last 8760 hours.  Past Procedures:   01/29/13 MRI brain w/o CM:  IMPRESSION: Generalized atrophy. No acute abnormality   Assessment/Plan Edema Not apparent, takes Furosemide 40mg  and 20mg  alternating dose.    Hypothyroidism Takes Levothyroxine 30mcg daily. 12/02/13 TSH 3.225.   Peripheral autonomic neuropathy in disorders classified elsewhere a history of chronic sensory polyneuropathy of undetermined origin with abnormality of gait. He is walking with a rolling walker. Last MRA of the head January 2013 shows no significant stenosis, MRA of the neck shows mild stenosis of the proximal left internal carotid artery.  Takes Metanx, Tylenol 650mg  qam and q6h prn, Tramadol 50mg  bid, and Cellcept 500mg  bid. Ambulates with walker.  F/u Neurology Takes Tylenol 1000 mg daily and Tramadol 50mg  bid    History of atrial fibrillation Heart rate is in control, takes Digoxin 127mcg. Digoxin level 0.6 12/02/13     HTN (hypertension) Takes Losartan 50mg  and Furosemide 20mg /40mg  alternating dose. Controlled blood pressure.      Benign prostatic hyperplasia No urinary retention. Takes Tamsulosin 0.8 mg daily.     CAD (coronary artery disease) of artery bypass graft No angina since admitted to SNF [facility]. F/u Cardiology. Continue Plavix and Atorvastatin for risk reduction.     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 Continue Erythromycin ointment nightly.     Hereditary and idiopathic peripheral neuropathy a history of chronic sensory polyneuropathy of undetermined origin with abnormality of gait. He is walking with a rolling walker. Last MRA of the head January 2013 shows no significant stenosis, MRA of the neck shows mild stenosis of the proximal left internal carotid artery.  Takes Metanx, Tylenol 650mg  qam and q6h  prn, Tramadol 50mg  bid, and Cellcept 500mg  bid. Ambulates with walker.  F/u Neurology   Memory loss SNF for care needs.  11/08/13 MMSE 17/30 03/01/14 MMSE 14/30 Recommend [facility].        Family/ Staff Communication: observe the patient  Goals of Care: SNF  Labs/tests ordered: none

## 2014-03-30 NOTE — Assessment & Plan Note (Signed)
No angina since admitted to SNF [facility]. F/u Cardiology. Continue Plavix and Atorvastatin for risk reduction.

## 2014-03-30 NOTE — Assessment & Plan Note (Signed)
No urinary retention. Takes Tamsulosin 0.8 mg daily.

## 2014-03-30 NOTE — Assessment & Plan Note (Addendum)
SNF for care needs.  11/08/13 MMSE 17/30 03/01/14 MMSE 14/30 Recommend Namenda.

## 2014-03-30 NOTE — Assessment & Plan Note (Signed)
Takes Levothyroxine 19mcg daily. 12/02/13 TSH 3.225.

## 2014-03-30 NOTE — Assessment & Plan Note (Signed)
Heart rate is in control, takes Digoxin 153mcg. Digoxin level 0.6 12/02/13

## 2014-03-30 NOTE — Assessment & Plan Note (Signed)
a history of chronic sensory polyneuropathy of undetermined origin with abnormality of gait. He is walking with a rolling walker. Last MRA of the head January 2013 shows no significant stenosis, MRA of the neck shows mild stenosis of the proximal left internal carotid artery.  Takes Metanx, Tylenol 650mg  qam and q6h prn, Tramadol 50mg  bid, and Cellcept 500mg  bid. Ambulates with walker.  F/u Neurology

## 2014-03-30 NOTE — Assessment & Plan Note (Signed)
Improving. Continue Erythromycin ointment nightly.

## 2014-04-25 ENCOUNTER — Non-Acute Institutional Stay (SKILLED_NURSING_FACILITY): Payer: Medicare Other | Admitting: Nurse Practitioner

## 2014-04-25 ENCOUNTER — Encounter: Payer: Self-pay | Admitting: Nurse Practitioner

## 2014-04-25 DIAGNOSIS — R609 Edema, unspecified: Secondary | ICD-10-CM

## 2014-04-25 DIAGNOSIS — I10 Essential (primary) hypertension: Secondary | ICD-10-CM

## 2014-04-25 DIAGNOSIS — Z8679 Personal history of other diseases of the circulatory system: Secondary | ICD-10-CM | POA: Diagnosis not present

## 2014-04-25 DIAGNOSIS — H01003 Unspecified blepharitis right eye, unspecified eyelid: Secondary | ICD-10-CM

## 2014-04-25 DIAGNOSIS — G609 Hereditary and idiopathic neuropathy, unspecified: Secondary | ICD-10-CM

## 2014-04-25 DIAGNOSIS — N400 Enlarged prostate without lower urinary tract symptoms: Secondary | ICD-10-CM

## 2014-04-25 DIAGNOSIS — R413 Other amnesia: Secondary | ICD-10-CM | POA: Diagnosis not present

## 2014-04-25 DIAGNOSIS — E039 Hypothyroidism, unspecified: Secondary | ICD-10-CM

## 2014-04-25 DIAGNOSIS — G909 Disorder of the autonomic nervous system, unspecified: Secondary | ICD-10-CM | POA: Diagnosis not present

## 2014-04-25 DIAGNOSIS — G990 Autonomic neuropathy in diseases classified elsewhere: Secondary | ICD-10-CM

## 2014-04-25 NOTE — Assessment & Plan Note (Signed)
a posterior circulation TIA in October 2009. Takes Plavix and Lipitor for risk reduction. Continue to observe.

## 2014-04-25 NOTE — Assessment & Plan Note (Signed)
a history of chronic sensory polyneuropathy of undetermined origin with abnormality of gait. He is walking with a rolling walker. Last MRA of the head January 2013 shows no significant stenosis, MRA of the neck shows mild stenosis of the proximal left internal carotid artery.  Takes Metanx, Tylenol 650mg  qam and q6h prn, Tramadol 50mg  bid, and Cellcept 500mg  bid. Ambulates with walker.  F/u Neurology

## 2014-04-25 NOTE — Assessment & Plan Note (Signed)
Takes Levothyroxine 32mcg daily. 12/02/13 TSH 3.225.

## 2014-04-25 NOTE — Assessment & Plan Note (Signed)
Much improved

## 2014-04-25 NOTE — Assessment & Plan Note (Signed)
SNF for care needs.  11/08/13 MMSE 17/30 03/01/14 MMSE 14/30 Tolerated Namenda well.

## 2014-04-25 NOTE — Assessment & Plan Note (Signed)
No urinary retention. Takes Tamsulosin 0.8 mg daily.

## 2014-04-25 NOTE — Progress Notes (Signed)
Patient ID: Jesse Burton, male   DOB: [birth year], 81 y.o.   MRN: [id number]   Code Status: DNR  Allergies  Allergen Reactions  . Sulfa Antibiotics     Chief Complaint  Patient presents with  . Medical Management of Chronic Issues    HPI: Patient is a 81 y.o. male seen in the SNF at [facility] today for evaluation of chronic medical conditions.  Problem List Items Addressed This Visit    Peripheral autonomic neuropathy in disorders classified elsewhere    a history of chronic sensory polyneuropathy of undetermined origin with abnormality of gait. He is walking with a rolling walker. Last MRA of the head January 2013 shows no significant stenosis, MRA of the neck shows mild stenosis of the proximal left internal carotid artery.  Takes Metanx, Tylenol 650mg  qam and q6h prn, Tramadol 50mg  bid, and Cellcept 500mg  bid. Ambulates with walker.  F/u Neurology       Memory loss    SNF for care needs.  11/08/13 MMSE 17/30 03/01/14 MMSE 14/30 Tolerated Namenda well.         Hypothyroidism    Takes Levothyroxine 60mcg daily. 12/02/13 TSH 3.225.       HTN (hypertension)    Takes Losartan 50mg  and Furosemide 20mg /40mg  alternating dose. Controlled blood pressure.          History of atrial fibrillation    Heart rate is in control, takes Digoxin. Digoxin level 0.6 12/02/13. 0.7 03/01/14      Hereditary and idiopathic peripheral neuropathy    a history of chronic sensory polyneuropathy of undetermined origin with abnormality of gait. He is walking with a rolling walker. Last MRA of the head January 2013 shows no significant stenosis, MRA of the neck shows mild stenosis of the proximal left internal carotid artery.  Takes Metanx, Tylenol 650mg  qam and q6h prn, Tramadol 50mg  bid, and Cellcept 500mg  bid. Ambulates with walker.  F/u Neurology       Edema - Primary    Not apparent, takes Furosemide 40mg  and 20mg  alternating dose.        Blepharitis of eyelid of right eye      Much improved.       Benign prostatic hyperplasia    No urinary retention. Takes Tamsulosin 0.8 mg daily.            Review of Systems:  Review of Systems  Constitutional: Negative for fever, chills and diaphoresis.  HENT: Positive for hearing loss. Negative for congestion, ear discharge, ear pain, nosebleeds, sore throat and tinnitus.   Eyes: Negative for photophobia, pain, discharge and redness.  Respiratory: Negative for cough, shortness of breath, wheezing and stridor.   Cardiovascular: Positive for leg swelling. Negative for chest pain and palpitations.       Trace only.   Gastrointestinal: Negative for nausea, vomiting, abdominal pain, diarrhea, constipation and blood in stool.  Endocrine: Negative for polydipsia.  Genitourinary: Positive for frequency. Negative for dysuria, urgency, hematuria and flank pain.  Musculoskeletal: Positive for back pain. Negative for myalgias and neck pain.       Ambulates with walker for short distance and w/c to go further.   Skin: Negative for rash.  Allergic/Immunologic: Negative for environmental allergies.  Neurological: Negative for dizziness, tremors, seizures, weakness and headaches.  Hematological: Does not bruise/bleed easily.  Psychiatric/Behavioral: Negative for suicidal ideas and hallucinations. The patient is nervous/anxious.      Past Medical History  Diagnosis Date  . History of atrial fibrillation   .  Thyroid disease   . Memory loss   . Stroke   . 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 Transient cerebral ischemia 07/04/2012    a posterior circulation TIA in October 2009.    Marland Kitchen HTN (hypertension) 12/01/2013  . CAD (coronary artery disease) of artery bypass graft 12/02/2013  . Hypothyroidism 12/01/2013  . Peripheral autonomic neuropathy in disorders classified elsewhere(337.1) 07/04/2012  . 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 Hereditary and idiopathic peripheral neuropathy 07/04/2012    a history of  chronic sensory polyneuropathy of undetermined origin with abnormality of gait. He is walking with a rolling walker. Last MRA of the head January 2013 shows no significant stenosis, MRA of the neck shows mild stenosis of the proximal left internal carotid artery.    . Abnormality of gait 07/04/2012  . Dizziness and giddiness 07/04/2012  . Benign prostatic hyperplasia   . Edema   . Chorioretinitis   . Panuveitis   . Internal carotid artery stenosis     left  . Mild left ventricular hypertrophy   . Actinic keratoses   . Diverticulitis   . Prostatitis, acute     1991   Past Surgical History  Procedure Laterality Date  . Cardiac catheterization    . Coronary artery bypass graft  1991    grafts to LAD and RCA  . Appendectomy     Social History:   reports that he quit smoking about 35 years ago. He has never used smokeless tobacco. He reports that he does not drink alcohol or use illicit drugs.  Family History  Problem Relation Age of Onset  . Congestive Heart Failure Father   . Pneumonia Mother   . Hypertension Father   . Coronary artery disease Father   . Heart attack Father   . Diabetes Mother   . Diabetes Father   . Cancer Mother     colon  . Hypertension Mother   . Heart failure Father   . Diabetes Sister   . Cancer Sister     breast  . Coronary artery disease Brother   . Heart disease Sister   . Diabetes Sister   . Kidney failure Sister   . Heart attack Sister     Medications: Patient's Medications  New Prescriptions   No medications on file  Previous Medications   ACETAMINOPHEN (TYLENOL) 325 MG TABLET    Take 650 mg by mouth every 6 (six) hours as needed.   ATORVASTATIN (LIPITOR) 40 MG TABLET    Take 40 mg by mouth daily.   AZELASTINE (ASTELIN) 137 MCG/SPRAY NASAL SPRAY       CETIRIZINE (ZYRTEC) 10 MG TABLET    Take 10 mg by mouth daily.   CLOPIDOGREL (PLAVIX) 75 MG TABLET    Take 75 mg by mouth daily.   DIGOXIN (LANOXIN) 0.125 MG TABLET    Take 0.125 mg by  mouth daily.   FUROSEMIDE (LASIX) 20 MG TABLET    Take 20 mg by mouth daily. Taking 1 tablet every other day, and 2 pills = 40 mg on the other days   GLUCOSAMINE PO    Take by mouth. Taking 250 daily    L-METHYLFOLATE-B12-B6-B2 (CEREFOLIN PO)    Take by mouth daily.     LEVOTHYROXINE (SYNTHROID, LEVOTHROID) 88 MCG TABLET    Take 88 mcg by mouth daily before breakfast.   LISINOPRIL (PRINIVIL,ZESTRIL) 10 MG TABLET       LOSARTAN (COZAAR) 50 MG TABLET       MULTIPLE  VITAMIN (MULTIVITAMIN) TABLET    Take 1 tablet by mouth daily.     MULTIVITAMIN (METANX) 3-35-2 MG TABS TABLET    Take 1 tablet by mouth daily.   MYCOPHENOLATE (CELLCEPT) 500 MG TABLET    Take by mouth 2 (two) times daily.   TAMSULOSIN HCL (FLOMAX) 0.4 MG CAPS    Take by mouth. Takes two daily    TRAMADOL (ULTRAM) 50 MG TABLET    Take 50 mg by mouth every 6 (six) hours as needed.  Modified Medications   No medications on file  Discontinued Medications   No medications on file     Physical Exam: Physical Exam  Constitutional: He appears well-developed and well-nourished. No distress.  HENT:  Head: Normocephalic and atraumatic.  Right Ear: External ear normal.  Left Ear: External ear normal.  Nose: Nose normal.  Mouth/Throat: Oropharynx is clear and moist. No oropharyngeal exudate.  Eyes: Conjunctivae and EOM are normal. Pupils are equal, round, and reactive to light. Right eye exhibits no discharge. Left eye exhibits no discharge. No scleral icterus.  Mild. Crusty matter right eyelashes.    Neck: Normal range of motion. Neck supple. No JVD present. No tracheal deviation present. No thyromegaly present.  Cardiovascular: Normal rate and regular rhythm.  Exam reveals no gallop and no friction rub.   Murmur heard. Systolic murmur 0-2/5 right sternal border   Pulmonary/Chest: Effort normal and breath sounds normal. No stridor. No respiratory distress. He has no wheezes. He has no rales. He exhibits no tenderness.  Abdominal:  Soft. Bowel sounds are normal. He exhibits no distension and no mass. There is no tenderness. There is no rebound and no guarding.  Musculoskeletal: Normal range of motion. He exhibits no edema or tenderness.  Lymphadenopathy:    He has no cervical adenopathy.  Neurological: He is alert. He has normal reflexes. No cranial nerve deficit. He exhibits normal muscle tone. Coordination normal.  Skin: Skin is warm and dry. No rash noted. He is not diaphoretic. No erythema. No pallor.  Psychiatric: He has a normal mood and affect. His behavior is normal. Judgment and thought content normal. Cognition and memory are impaired. He exhibits abnormal recent memory. He exhibits normal remote memory.    Filed Vitals:   04/25/14 0957  BP: 118/90  Pulse: 66  Temp: 98.6 F (37 C)  TempSrc: Tympanic  Resp: 18      Labs reviewed: Basic Metabolic Panel:  Recent Labs  12/02/13 03/01/14  NA 141 144  K 3.6 4.1  BUN 22* 29*  CREATININE 1.0 0.8  TSH 3.22  --    Liver Function Tests:  Recent Labs  12/02/13 03/01/14  AST 28 42*  ALT 32 60*  ALKPHOS 64 70   No results for input(s): LIPASE, AMYLASE in the last 8760 hours. No results for input(s): AMMONIA in the last 8760 hours. CBC:  Recent Labs  12/02/13 03/01/14  WBC 4.2 11.4  HGB 11.4* 11.7*  HCT 34* 36*  PLT 214 419*   Lipid Panel: No results for input(s): CHOL, HDL, LDLCALC, TRIG, CHOLHDL, LDLDIRECT in the last 8760 hours.  Past Procedures:  01/29/13 MRI brain w/o CM:  IMPRESSION: Generalized atrophy. No acute abnormality   Assessment/Plan Edema Not apparent, takes Furosemide 40mg  and 20mg  alternating dose.     Hypothyroidism Takes Levothyroxine 35mcg daily. 12/02/13 TSH 3.225.    Peripheral autonomic neuropathy in disorders classified elsewhere a history of chronic sensory polyneuropathy of undetermined origin with abnormality of gait. He is walking  with a rolling walker. Last MRA of the head January 2013 shows no  significant stenosis, MRA of the neck shows mild stenosis of the proximal left internal carotid artery.  Takes Metanx, Tylenol 650mg  qam and q6h prn, Tramadol 50mg  bid, and Cellcept 500mg  bid. Ambulates with walker.  F/u Neurology    Memory loss SNF for care needs.  11/08/13 MMSE 17/30 03/01/14 MMSE 14/30 Tolerated Namenda well.      HTN (hypertension) Takes Losartan 50mg  and Furosemide 20mg /40mg  alternating dose. Controlled blood pressure.       History of atrial fibrillation Heart rate is in control, takes Digoxin. Digoxin level 0.6 12/02/13. 0.7 03/01/14   Benign prostatic hyperplasia No urinary retention. Takes Tamsulosin 0.8 mg daily.      Blepharitis of eyelid of right eye Much improved.    Transient cerebral ischemia a posterior circulation TIA in October 2009. Takes Plavix and Lipitor for risk reduction. Continue to observe.      Hereditary and idiopathic peripheral neuropathy a history of chronic sensory polyneuropathy of undetermined origin with abnormality of gait. He is walking with a rolling walker. Last MRA of the head January 2013 shows no significant stenosis, MRA of the neck shows mild stenosis of the proximal left internal carotid artery.  Takes Metanx, Tylenol 650mg  qam and q6h prn, Tramadol 50mg  bid, and Cellcept 500mg  bid. Ambulates with walker.  F/u Neurology      Family/ Staff Communication: observe the patient  Goals of Care: SNF  Labs/tests ordered: none

## 2014-04-25 NOTE — Assessment & Plan Note (Signed)
Not apparent, takes Furosemide 40mg  and 20mg  alternating dose.

## 2014-04-25 NOTE — Assessment & Plan Note (Signed)
Heart rate is in control, takes Digoxin. Digoxin level 0.6 12/02/13. 0.7 03/01/14

## 2014-04-25 NOTE — Assessment & Plan Note (Signed)
Takes Losartan 50mg  and Furosemide 20mg /40mg  alternating dose. Controlled blood pressure.

## 2014-05-05 ENCOUNTER — Ambulatory Visit: Payer: Medicare Other | Admitting: Neurology

## 2014-05-05 DIAGNOSIS — Z9889 Other specified postprocedural states: Secondary | ICD-10-CM | POA: Insufficient documentation

## 2014-05-25 ENCOUNTER — Encounter: Payer: Self-pay | Admitting: Nurse Practitioner

## 2014-05-25 ENCOUNTER — Non-Acute Institutional Stay (SKILLED_NURSING_FACILITY): Payer: Medicare Other | Admitting: Nurse Practitioner

## 2014-05-25 DIAGNOSIS — E039 Hypothyroidism, unspecified: Secondary | ICD-10-CM | POA: Diagnosis not present

## 2014-05-25 DIAGNOSIS — G909 Disorder of the autonomic nervous system, unspecified: Secondary | ICD-10-CM | POA: Diagnosis not present

## 2014-05-25 DIAGNOSIS — H01003 Unspecified blepharitis right eye, unspecified eyelid: Secondary | ICD-10-CM | POA: Diagnosis not present

## 2014-05-25 DIAGNOSIS — G450 Vertebro-basilar artery syndrome: Secondary | ICD-10-CM | POA: Diagnosis not present

## 2014-05-25 DIAGNOSIS — Z8679 Personal history of other diseases of the circulatory system: Secondary | ICD-10-CM

## 2014-05-25 DIAGNOSIS — T7840XS Allergy, unspecified, sequela: Secondary | ICD-10-CM

## 2014-05-25 DIAGNOSIS — R413 Other amnesia: Secondary | ICD-10-CM

## 2014-05-25 DIAGNOSIS — R609 Edema, unspecified: Secondary | ICD-10-CM | POA: Diagnosis not present

## 2014-05-25 DIAGNOSIS — N400 Enlarged prostate without lower urinary tract symptoms: Secondary | ICD-10-CM

## 2014-05-25 DIAGNOSIS — I10 Essential (primary) hypertension: Secondary | ICD-10-CM

## 2014-05-25 DIAGNOSIS — G990 Autonomic neuropathy in diseases classified elsewhere: Secondary | ICD-10-CM

## 2014-05-25 NOTE — Assessment & Plan Note (Signed)
a posterior circulation TIA in October 2009. Takes Plavix and Lipitor for risk reduction. Continue to observe.

## 2014-05-25 NOTE — Assessment & Plan Note (Signed)
Takes Losartan 50mg  and Furosemide 20mg /40mg  alternating dose. Controlled blood pressure.

## 2014-05-25 NOTE — Assessment & Plan Note (Signed)
Not apparent, takes Furosemide 40mg  and 20mg  alternating dose. Weigh daily ac breakfast x 4 days then re-eval. Obtain CMP and BNP

## 2014-05-25 NOTE — Assessment & Plan Note (Signed)
Takes Levothyroxine 83mcg daily. 12/02/13 TSH 3.225. Update TSH

## 2014-05-25 NOTE — Progress Notes (Signed)
Patient ID: Jesse Burton, male   DOB: January 12, 1926, 81 y.o.   MRN: [id number]   Code Status: DNR  Allergies  Allergen Reactions  . Sulfa Antibiotics     Chief Complaint  Patient presents with  . Medical Management of Chronic Issues  . Acute Visit    edema    HPI: Patient is a 81 y.o. male seen in the SNF at [facility] today for evaluation of edema and chronic medical conditions.  Problem List Items Addressed This Visit    Allergy    Prn Astelin nasal sray, Zyrtec, and Mucinex available to him.       Benign prostatic hyperplasia    No urinary retention. Takes Tamsulosin 0.8 mg daily.          Blepharitis of eyelid of right eye    Much improved. Continue Erythromycin ophth oint nightly.        Edema - Primary    Not apparent, takes Furosemide 40mg  and 20mg  alternating dose. Weigh daily ac breakfast x 4 days then re-eval. Obtain CMP and BNP       History of atrial fibrillation    Heart rate is in control, takes Digoxin. Digoxin level 0.8 05/03/14       HTN (hypertension)    Takes Losartan 50mg  and Furosemide 20mg /40mg  alternating dose. Controlled blood pressure.        Hypothyroidism    Takes Levothyroxine 15mcg daily. 12/02/13 TSH 3.225. Update TSH       Memory loss    SNF for care needs.  11/08/13 MMSE 17/30 03/01/14 MMSE 14/30 Tolerated Namenda well      Peripheral autonomic neuropathy in disorders classified elsewhere    a history of chronic sensory polyneuropathy of undetermined origin with abnormality of gait. He is walking with a rolling walker. Last MRA of the head January 2013 shows no significant stenosis, MRA of the neck shows mild stenosis of the proximal left internal carotid artery.  Takes Metanx, Tylenol 650mg  qam and q6h prn, Tramadol 50mg  bid, and Cellcept 500mg  bid. Ambulates with walker.  F/u Neurology Update CBC        Transient cerebral ischemia    a posterior circulation TIA in October 2009. Takes Plavix and Lipitor for  risk reduction. Continue to observe.            Review of Systems:  Review of Systems  Constitutional: Negative for fever, chills and diaphoresis.  HENT: Positive for hearing loss. Negative for congestion, ear discharge, ear pain, nosebleeds, sore throat and tinnitus.   Eyes: Negative for photophobia, pain, discharge and redness.  Respiratory: Negative for cough, shortness of breath, wheezing and stridor.   Cardiovascular: Positive for leg swelling. Negative for chest pain and palpitations.       Trace only.   Gastrointestinal: Negative for nausea, vomiting, abdominal pain, diarrhea, constipation and blood in stool.  Endocrine: Negative for polydipsia.  Genitourinary: Positive for frequency. Negative for dysuria, urgency, hematuria and flank pain.  Musculoskeletal: Positive for back pain. Negative for myalgias and neck pain.       Ambulates with walker for short distance and w/c to go further.   Skin: Negative for rash.  Allergic/Immunologic: Negative for environmental allergies.  Neurological: Negative for dizziness, tremors, seizures, weakness and headaches.  Hematological: Does not bruise/bleed easily.  Psychiatric/Behavioral: Negative for suicidal ideas and hallucinations. The patient is nervous/anxious.      Past Medical History  Diagnosis Date  . History of atrial fibrillation   .  Thyroid disease   . Memory loss   . Stroke   . 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 Transient cerebral ischemia 07/04/2012    a posterior circulation TIA in October 2009.    Marland Kitchen HTN (hypertension) 12/01/2013  . CAD (coronary artery disease) of artery bypass graft 12/02/2013  . Hypothyroidism 12/01/2013  . Peripheral autonomic neuropathy in disorders classified elsewhere(337.1) 07/04/2012  . 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 Hereditary and idiopathic peripheral neuropathy 07/04/2012    a history of chronic sensory polyneuropathy of undetermined origin with abnormality of  gait. He is walking with a rolling walker. Last MRA of the head January 2013 shows no significant stenosis, MRA of the neck shows mild stenosis of the proximal left internal carotid artery.    . Abnormality of gait 07/04/2012  . Dizziness and giddiness 07/04/2012  . Benign prostatic hyperplasia   . Edema   . Chorioretinitis   . Panuveitis   . Internal carotid artery stenosis     left  . Mild left ventricular hypertrophy   . Actinic keratoses   . Diverticulitis   . Prostatitis, acute     1991   Past Surgical History  Procedure Laterality Date  . Cardiac catheterization    . Coronary artery bypass graft  1991    grafts to LAD and RCA  . Appendectomy     Social History:   reports that he quit smoking about 35 years ago. He has never used smokeless tobacco. He reports that he does not drink alcohol or use illicit drugs.  Family History  Problem Relation Age of Onset  . Congestive Heart Failure Father   . Pneumonia Mother   . Hypertension Father   . Coronary artery disease Father   . Heart attack Father   . Diabetes Mother   . Diabetes Father   . Cancer Mother     colon  . Hypertension Mother   . Heart failure Father   . Diabetes Sister   . Cancer Sister     breast  . Coronary artery disease Brother   . Heart disease Sister   . Diabetes Sister   . Kidney failure Sister   . Heart attack Sister     Medications: Patient's Medications  New Prescriptions   No medications on file  Previous Medications   ACETAMINOPHEN (TYLENOL) 325 MG TABLET    Take 650 mg by mouth every 6 (six) hours as needed.   ATORVASTATIN (LIPITOR) 40 MG TABLET    Take 40 mg by mouth daily.   AZELASTINE (ASTELIN) 137 MCG/SPRAY NASAL SPRAY       CETIRIZINE (ZYRTEC) 10 MG TABLET    Take 10 mg by mouth daily.   CLOPIDOGREL (PLAVIX) 75 MG TABLET    Take 75 mg by mouth daily.   DIGOXIN (LANOXIN) 0.125 MG TABLET    Take 0.125 mg by mouth daily.   FUROSEMIDE (LASIX) 20 MG TABLET    Take 20 mg by mouth  daily. Taking 1 tablet every other day, and 2 pills = 40 mg on the other days   GLUCOSAMINE PO    Take by mouth. Taking 250 daily    L-METHYLFOLATE-B12-B6-B2 (CEREFOLIN PO)    Take by mouth daily.     LEVOTHYROXINE (SYNTHROID, LEVOTHROID) 88 MCG TABLET    Take 88 mcg by mouth daily before breakfast.   LISINOPRIL (PRINIVIL,ZESTRIL) 10 MG TABLET       LOSARTAN (COZAAR) 50 MG TABLET       MULTIPLE  VITAMIN (MULTIVITAMIN) TABLET    Take 1 tablet by mouth daily.     MULTIVITAMIN (METANX) 3-35-2 MG TABS TABLET    Take 1 tablet by mouth daily.   MYCOPHENOLATE (CELLCEPT) 500 MG TABLET    Take by mouth 2 (two) times daily.   TAMSULOSIN HCL (FLOMAX) 0.4 MG CAPS    Take by mouth. Takes two daily    TRAMADOL (ULTRAM) 50 MG TABLET    Take 50 mg by mouth every 6 (six) hours as needed.  Modified Medications   No medications on file  Discontinued Medications   No medications on file     Physical Exam: Physical Exam  Constitutional: He appears well-developed and well-nourished. No distress.  HENT:  Head: Normocephalic and atraumatic.  Right Ear: External ear normal.  Left Ear: External ear normal.  Nose: Nose normal.  Mouth/Throat: Oropharynx is clear and moist. No oropharyngeal exudate.  Eyes: Conjunctivae and EOM are normal. Pupils are equal, round, and reactive to light. Right eye exhibits no discharge. Left eye exhibits no discharge. No scleral icterus.  Mild. Crusty matter right eyelashes.    Neck: Normal range of motion. Neck supple. No JVD present. No tracheal deviation present. No thyromegaly present.  Cardiovascular: Normal rate and regular rhythm.  Exam reveals no gallop and no friction rub.   Murmur heard. Systolic murmur 1-0/2 right sternal border   Pulmonary/Chest: Effort normal and breath sounds normal. No stridor. No respiratory distress. He has no wheezes. He has no rales. He exhibits no tenderness.  Abdominal: Soft. Bowel sounds are normal. He exhibits no distension and no mass.  There is no tenderness. There is no rebound and no guarding.  Musculoskeletal: Normal range of motion. He exhibits no edema or tenderness.  Lymphadenopathy:    He has no cervical adenopathy.  Neurological: He is alert. He has normal reflexes. No cranial nerve deficit. He exhibits normal muscle tone. Coordination normal.  Skin: Skin is warm and dry. No rash noted. He is not diaphoretic. No erythema. No pallor.  Psychiatric: He has a normal mood and affect. His behavior is normal. Judgment and thought content normal. Cognition and memory are impaired. He exhibits abnormal recent memory. He exhibits normal remote memory.    Filed Vitals:   05/25/14 1047  BP: 130/76  Pulse: 68  Temp: 98.2 F (36.8 C)  TempSrc: Tympanic  Resp: 16      Labs reviewed: Basic Metabolic Panel:  Recent Labs  12/02/13 03/01/14 05/27/14  NA 141 144 137  K 3.6 4.1 3.9  BUN 22* 29* 19  CREATININE 1.0 0.8 0.9  TSH 3.22  --  2.47   Liver Function Tests:  Recent Labs  12/02/13 03/01/14 05/27/14  AST 28 42* 18  ALT 32 60* 15  ALKPHOS 64 70 60   No results for input(s): LIPASE, AMYLASE in the last 8760 hours. No results for input(s): AMMONIA in the last 8760 hours. CBC:  Recent Labs  12/02/13 03/01/14 05/27/14  WBC 4.2 11.4 4.9  HGB 11.4* 11.7* 10.7*  HCT 34* 36* 32*  PLT 214 419* 240   Lipid Panel: No results for input(s): CHOL, HDL, LDLCALC, TRIG, CHOLHDL, LDLDIRECT in the last 8760 hours.  Past Procedures:  01/29/13 MRI brain w/o CM:  IMPRESSION: Generalized atrophy. No acute abnormality   Assessment/Plan Edema Not apparent, takes Furosemide 40mg  and 20mg  alternating dose. Weigh daily ac breakfast x 4 days then re-eval. Obtain CMP and BNP    Hypothyroidism Takes Levothyroxine 14mcg daily. 12/02/13 TSH  3.225. Update TSH    Peripheral autonomic neuropathy in disorders classified elsewhere a history of chronic sensory polyneuropathy of undetermined origin with abnormality of  gait. He is walking with a rolling walker. Last MRA of the head January 2013 shows no significant stenosis, MRA of the neck shows mild stenosis of the proximal left internal carotid artery.  Takes Metanx, Tylenol 650mg  qam and q6h prn, Tramadol 50mg  bid, and Cellcept 500mg  bid. Ambulates with walker.  F/u Neurology Update CBC     Memory loss SNF for care needs.  11/08/13 MMSE 17/30 03/01/14 MMSE 14/30 Tolerated Namenda well   Transient cerebral ischemia a posterior circulation TIA in October 2009. Takes Plavix and Lipitor for risk reduction. Continue to observe.      HTN (hypertension) Takes Losartan 50mg  and Furosemide 20mg /40mg  alternating dose. Controlled blood pressure.     History of atrial fibrillation Heart rate is in control, takes Digoxin. Digoxin level 0.8 05/03/14    Benign prostatic hyperplasia No urinary retention. Takes Tamsulosin 0.8 mg daily.       Blepharitis of eyelid of right eye Much improved. Continue Erythromycin ophth oint nightly.     Allergy Prn Astelin nasal sray, Zyrtec, and Mucinex available to him.      Family/ Staff Communication: observe the patient  Goals of Care: SNF  Labs/tests ordered: CMP, BNP, TSH, CBC

## 2014-05-25 NOTE — Assessment & Plan Note (Signed)
No urinary retention. Takes Tamsulosin 0.8 mg daily.

## 2014-05-25 NOTE — Assessment & Plan Note (Signed)
SNF for care needs.  11/08/13 MMSE 17/30 03/01/14 MMSE 14/30 Tolerated Namenda well

## 2014-05-25 NOTE — Progress Notes (Signed)
Patient ID: Jesse Burton, male   DOB: 1925-05-12, 81 y.o.   MRN: [id number]  This encounter was created in error - please disregard.

## 2014-05-25 NOTE — Assessment & Plan Note (Addendum)
a history of chronic sensory polyneuropathy of undetermined origin with abnormality of gait. He is walking with a rolling walker. Last MRA of the head January 2013 shows no significant stenosis, MRA of the neck shows mild stenosis of the proximal left internal carotid artery.  Takes Metanx, Tylenol 650mg  qam and q6h prn, Tramadol 50mg  bid, and Cellcept 500mg  bid. Ambulates with walker.  F/u Neurology Update CBC

## 2014-05-25 NOTE — Assessment & Plan Note (Signed)
Prn Astelin nasal sray, Zyrtec, and Mucinex available to him.

## 2014-05-25 NOTE — Assessment & Plan Note (Signed)
Heart rate is in control, takes Digoxin. Digoxin level 0.8 05/03/14

## 2014-05-25 NOTE — Assessment & Plan Note (Signed)
Much improved. Continue Erythromycin ophth oint nightly.

## 2014-05-27 LAB — TSH: TSH: 2.47 u[IU]/mL (ref 0.41–5.90)

## 2014-05-27 LAB — HEPATIC FUNCTION PANEL
ALT: 15 U/L (ref 10–40)
AST: 18 U/L (ref 14–40)
Alkaline Phosphatase: 60 U/L (ref 25–125)
Bilirubin, Total: 0.6 mg/dL

## 2014-05-27 LAB — BASIC METABOLIC PANEL
BUN: 19 mg/dL (ref 4–21)
Creatinine: 0.9 mg/dL (ref 0.6–1.3)
Glucose: 91 mg/dL
Potassium: 3.9 mmol/L (ref 3.4–5.3)
Sodium: 137 mmol/L (ref 137–147)

## 2014-05-27 LAB — CBC AND DIFFERENTIAL
HCT: 32 % — AB (ref 41–53)
Hemoglobin: 10.7 g/dL — AB (ref 13.5–17.5)
PLATELETS: 240 10*3/uL (ref 150–399)
WBC: 4.9 10^3/mL

## 2014-06-27 ENCOUNTER — Non-Acute Institutional Stay (SKILLED_NURSING_FACILITY): Payer: Medicare Other | Admitting: Internal Medicine

## 2014-06-27 DIAGNOSIS — R413 Other amnesia: Secondary | ICD-10-CM | POA: Diagnosis not present

## 2014-06-27 DIAGNOSIS — I10 Essential (primary) hypertension: Secondary | ICD-10-CM

## 2014-06-27 DIAGNOSIS — I25700 Atherosclerosis of coronary artery bypass graft(s), unspecified, with unstable angina pectoris: Secondary | ICD-10-CM | POA: Diagnosis not present

## 2014-06-27 DIAGNOSIS — E039 Hypothyroidism, unspecified: Secondary | ICD-10-CM

## 2014-06-27 DIAGNOSIS — R269 Unspecified abnormalities of gait and mobility: Secondary | ICD-10-CM

## 2014-06-27 DIAGNOSIS — R609 Edema, unspecified: Secondary | ICD-10-CM | POA: Diagnosis not present

## 2014-06-27 NOTE — Progress Notes (Signed)
Patient ID: Jesse Burton, male   DOB: 03-Jun-1925, 81 y.o.   MRN: [id number]    [facility] Room Number: 23  Place of Service: SNF (31) OFFICE    Allergies  Allergen Reactions  . Sulfa Antibiotics     Chief Complaint  Patient presents with  . Medical Management of Chronic Issues    HPI:  Essential hypertension: Controlled  Memory loss: Unchanged  Edema: Approximately 1+ bipedal  Coronary artery disease involving coronary bypass graft of native heart with unstable angina pectoris: No chest pain  Abnormality of gait: Unsteady on feet. Complaints of discomfort in right leg with weightbearing.  Hypothyroidism, unspecified hypothyroidism type: Compensated    Medications: Patient's Medications  New Prescriptions   No medications on file  Previous Medications   ACETAMINOPHEN (TYLENOL) 325 MG TABLET    Take 650 mg by mouth every 6 (six) hours as needed.   ATORVASTATIN (LIPITOR) 40 MG TABLET    Take 40 mg by mouth daily.   AZELASTINE (ASTELIN) 137 MCG/SPRAY NASAL SPRAY       CETIRIZINE (ZYRTEC) 10 MG TABLET    Take 10 mg by mouth daily.   CLOPIDOGREL (PLAVIX) 75 MG TABLET    Take 75 mg by mouth daily.   DIGOXIN (LANOXIN) 0.125 MG TABLET    Take 0.125 mg by mouth daily.   FUROSEMIDE (LASIX) 20 MG TABLET    Take 20 mg by mouth daily. Taking 1 tablet every other day, and 2 pills = 40 mg on the other days   GLUCOSAMINE PO    Take by mouth. Taking 250 daily    L-METHYLFOLATE-B12-B6-B2 (CEREFOLIN PO)    Take by mouth daily.     LEVOTHYROXINE (SYNTHROID, LEVOTHROID) 88 MCG TABLET    Take 88 mcg by mouth daily before breakfast.   LISINOPRIL (PRINIVIL,ZESTRIL) 10 MG TABLET       LOSARTAN (COZAAR) 50 MG TABLET       MULTIPLE VITAMIN (MULTIVITAMIN) TABLET    Take 1 tablet by mouth daily.     MULTIVITAMIN (METANX) 3-35-2 MG TABS TABLET    Take 1 tablet by mouth daily.   MYCOPHENOLATE (CELLCEPT) 500 MG TABLET    Take by mouth 2 (two) times daily.   TAMSULOSIN HCL (FLOMAX) 0.4 MG CAPS    Take by mouth. Takes two daily    TRAMADOL (ULTRAM) 50 MG TABLET    Take 50 mg by mouth every 6 (six) hours as needed.  Modified Medications   No medications on file  Discontinued Medications   No medications on file     Review of Systems  Constitutional: Negative for fever, chills and diaphoresis.  HENT: Positive for hearing loss. Negative for congestion, ear discharge, ear pain, nosebleeds, sore throat and tinnitus.   Eyes: Negative for photophobia, pain, discharge and redness.  Respiratory: Negative for cough, shortness of breath, wheezing and stridor.   Cardiovascular: Positive for leg swelling. Negative for chest pain and palpitations.       Trace only.   Gastrointestinal: Negative for nausea, vomiting, abdominal pain, diarrhea, constipation and blood in stool.  Endocrine: Negative for polydipsia.  Genitourinary: Positive for frequency. Negative for dysuria, urgency, hematuria and flank pain.  Musculoskeletal: Positive for back pain. Negative for myalgias and neck pain.       Ambulates with walker for short distance and w/c to go further. Complains of right leg discomfort.  Skin: Negative for rash.  Allergic/Immunologic: Negative for environmental allergies.  Neurological: Negative for  dizziness, tremors, seizures, weakness and headaches.       Dementia  Hematological: Negative.  Does not bruise/bleed easily.  Psychiatric/Behavioral: Positive for confusion. Negative for suicidal ideas and hallucinations. The patient is nervous/anxious.     Filed Vitals:   06/27/14 1219  BP: 108/64  Pulse: 80  Temp: 97.2 F (36.2 C)  Resp: 18  Height: 6' (1.829 m)  Weight: 189 lb 9.6 oz (86.002 kg)   Body mass index is 25.71 kg/(m^2).  Physical Exam  Constitutional: He appears well-developed and well-nourished. No distress.  HENT:  Head: Normocephalic and atraumatic.  Right Ear: External ear normal.  Left Ear: External ear normal.  Nose: Nose  normal.  Mouth/Throat: Oropharynx is clear and moist. No oropharyngeal exudate.  Eyes: Conjunctivae and EOM are normal. Pupils are equal, round, and reactive to light. Right eye exhibits no discharge. Left eye exhibits no discharge. No scleral icterus.  Neck: Normal range of motion. Neck supple. No JVD present. No tracheal deviation present. No thyromegaly present.  Cardiovascular: Normal rate and regular rhythm.  Exam reveals no gallop and no friction rub.   Murmur heard. Systolic murmur 6-2/1 right sternal border   Pulmonary/Chest: Effort normal and breath sounds normal. No stridor. No respiratory distress. He has no wheezes. He has no rales. He exhibits no tenderness.  Abdominal: Soft. Bowel sounds are normal. He exhibits no distension and no mass. There is no tenderness. There is no rebound and no guarding.  Musculoskeletal: Normal range of motion. He exhibits no edema or tenderness.  Lymphadenopathy:    He has no cervical adenopathy.  Neurological: He is alert. He has normal reflexes. No cranial nerve deficit. He exhibits normal muscle tone. Coordination normal.  Skin: Skin is warm and dry. No rash noted. He is not diaphoretic. No erythema. No pallor.  Psychiatric: He has a normal mood and affect. His behavior is normal. Judgment and thought content normal. Cognition and memory are impaired. He exhibits abnormal recent memory. He exhibits normal remote memory.     Labs reviewed: Nursing Home on 05/25/2014  Component Date Value Ref Range Status  . Hemoglobin 05/27/2014 10.7* 13.5 - 17.5 g/dL Final  . HCT 05/27/2014 32* 41 - 53 % Final  . Platelets 05/27/2014 240  150 - 399 K/L Final  . WBC 05/27/2014 4.9   Final  . Glucose 05/27/2014 91   Final  . BUN 05/27/2014 19  4 - 21 mg/dL Final  . Creatinine 05/27/2014 0.9  0.6 - 1.3 mg/dL Final  . Potassium 05/27/2014 3.9  3.4 - 5.3 mmol/L Final  . Sodium 05/27/2014 137  137 - 147 mmol/L Final  . Alkaline Phosphatase 05/27/2014 60  25 - 125  U/L Final  . ALT 05/27/2014 15  10 - 40 U/L Final  . AST 05/27/2014 18  14 - 40 U/L Final  . Bilirubin, Total 05/27/2014 0.6   Final  . TSH 05/27/2014 2.47  0.41 - 5.90 uIU/mL Final     Assessment/Plan    1. Essential hypertension Controlled  2. Memory loss Unchanged  3. Edema Unchanged  4. Coronary artery disease involving coronary bypass graft of native heart with unstable angina pectoris Asymptomatic  5. Abnormality of gait At risk for falls  6. Hypothyroidism, unspecified hypothyroidism type Normal TSH when last checked

## 2014-07-28 ENCOUNTER — Ambulatory Visit: Payer: Medicare Other | Admitting: Neurology

## 2014-08-08 ENCOUNTER — Non-Acute Institutional Stay (SKILLED_NURSING_FACILITY): Payer: Medicare Other | Admitting: Nurse Practitioner

## 2014-08-08 ENCOUNTER — Encounter: Payer: Self-pay | Admitting: Nurse Practitioner

## 2014-08-08 DIAGNOSIS — R269 Unspecified abnormalities of gait and mobility: Secondary | ICD-10-CM | POA: Diagnosis not present

## 2014-08-08 DIAGNOSIS — T7840XS Allergy, unspecified, sequela: Secondary | ICD-10-CM | POA: Diagnosis not present

## 2014-08-08 DIAGNOSIS — Z8679 Personal history of other diseases of the circulatory system: Secondary | ICD-10-CM

## 2014-08-08 DIAGNOSIS — I25700 Atherosclerosis of coronary artery bypass graft(s), unspecified, with unstable angina pectoris: Secondary | ICD-10-CM

## 2014-08-08 DIAGNOSIS — R609 Edema, unspecified: Secondary | ICD-10-CM

## 2014-08-08 DIAGNOSIS — R413 Other amnesia: Secondary | ICD-10-CM | POA: Diagnosis not present

## 2014-08-08 DIAGNOSIS — G609 Hereditary and idiopathic neuropathy, unspecified: Secondary | ICD-10-CM

## 2014-08-08 DIAGNOSIS — G459 Transient cerebral ischemic attack, unspecified: Secondary | ICD-10-CM | POA: Diagnosis not present

## 2014-08-08 DIAGNOSIS — G990 Autonomic neuropathy in diseases classified elsewhere: Secondary | ICD-10-CM

## 2014-08-08 DIAGNOSIS — N400 Enlarged prostate without lower urinary tract symptoms: Secondary | ICD-10-CM | POA: Diagnosis not present

## 2014-08-08 DIAGNOSIS — G909 Disorder of the autonomic nervous system, unspecified: Secondary | ICD-10-CM

## 2014-08-08 DIAGNOSIS — I10 Essential (primary) hypertension: Secondary | ICD-10-CM | POA: Diagnosis not present

## 2014-08-08 DIAGNOSIS — H01003 Unspecified blepharitis right eye, unspecified eyelid: Secondary | ICD-10-CM

## 2014-08-08 DIAGNOSIS — E039 Hypothyroidism, unspecified: Secondary | ICD-10-CM

## 2014-08-08 NOTE — Progress Notes (Signed)
Patient ID: Jesse Burton, male   DOB: [birth year], 81 y.o.   MRN: [id number]   Code Status: DNR  Allergies  Allergen Reactions  . Sulfa Antibiotics     Chief Complaint  Patient presents with  . Medical Management of Chronic Issues    HPI: Patient is a 81 y.o. male seen in the SNF at [facility] today for evaluation of edema and chronic medical conditions.  Problem List Items Addressed This Visit    Memory loss - Primary (Chronic)    SNF for care needs.  11/08/13 MMSE 17/30 03/01/14 MMSE 14/30 Tolerated Namenda well       Abnormality of gait (Chronic)    Ambulates with walker for short distance and w/c to go further.       Hypothyroidism (Chronic)    Takes Levothyroxine 12mcg daily. 12/02/13 TSH 3.225, 2.472 05/27/14       HTN (hypertension) (Chronic)    Takes Losartan 50mg  and Furosemide 20mg /40mg  alternating dose. Controlled blood pressure.         CAD (coronary artery disease) of artery bypass graft (Chronic)    No angina since admitted to SNF [facility]. F/u Cardiology. Continue Plavix and Atorvastatin for risk reduction.         Edema (Chronic)    Not apparent, takes Furosemide 40mg  and 20mg  alternating dose      History of atrial fibrillation    Heart rate is in control, takes Digoxin. Digoxin level 0.7 06/28/14        Peripheral autonomic neuropathy in disorders classified elsewhere    a history of chronic sensory polyneuropathy of undetermined origin with abnormality of gait. He is walking with a rolling walker. Last MRA of the head January 2013 shows no significant stenosis, MRA of the neck shows mild stenosis of the proximal left internal carotid artery.  Takes Metanx, Tylenol 650mg  qam and q6h prn, Tramadol 50mg  bid, and Cellcept 500mg  bid. Ambulates with walker.  F/u Neurology       Hereditary and idiopathic peripheral neuropathy    a history of chronic sensory polyneuropathy of undetermined origin with abnormality of gait. He is walking with a  rolling walker. Last MRA of the head January 2013 shows no significant stenosis, MRA of the neck shows mild stenosis of the proximal left internal carotid artery.  Takes Metanx, Tylenol 650mg  qam and q6h prn, Tramadol 50mg  bid, and Cellcept 500mg  bid. Ambulates with walker.  F/u Neurology      Transient cerebral ischemia    a posterior circulation TIA in October 2009. Takes Plavix and Lipitor for risk reduction. Continue to observe.         Benign prostatic hyperplasia    No urinary retention. Takes Tamsulosin 0.8 mg daily.         Allergy    6/616 dc Azelastine nasal spray, Cetirizine, and Mucinex prn.  stable      Blepharitis of eyelid of right eye    Much improved. Continue Erythromycin ophth oint nightly.            Review of Systems:  Review of Systems  Constitutional: Negative for fever, chills and diaphoresis.  HENT: Positive for hearing loss. Negative for congestion, ear discharge, ear pain, nosebleeds, sore throat and tinnitus.   Eyes: Negative for photophobia, pain, discharge and redness.  Respiratory: Negative for cough, shortness of breath, wheezing and stridor.   Cardiovascular: Positive for leg swelling. Negative for chest pain and palpitations.       Trace only.  Gastrointestinal: Negative for nausea, vomiting, abdominal pain, diarrhea, constipation and blood in stool.  Endocrine: Negative for polydipsia.  Genitourinary: Positive for frequency. Negative for dysuria, urgency, hematuria and flank pain.  Musculoskeletal: Positive for back pain. Negative for myalgias and neck pain.       Ambulates with walker for short distance and w/c to go further.   Skin: Negative for rash.  Allergic/Immunologic: Negative for environmental allergies.  Neurological: Negative for dizziness, tremors, seizures, weakness and headaches.  Hematological: Does not bruise/bleed easily.  Psychiatric/Behavioral: Negative for suicidal ideas and hallucinations. The patient is  nervous/anxious.      Past Medical History  Diagnosis Date  . History of atrial fibrillation   . Thyroid disease   . Memory loss   . Stroke   . 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 Transient cerebral ischemia 07/04/2012    a posterior circulation TIA in October 2009.    Marland Kitchen HTN (hypertension) 12/01/2013  . CAD (coronary artery disease) of artery bypass graft 12/02/2013  . Hypothyroidism 12/01/2013  . Peripheral autonomic neuropathy in disorders classified elsewhere(337.1) 07/04/2012  . 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 Hereditary and idiopathic peripheral neuropathy 07/04/2012    a history of chronic sensory polyneuropathy of undetermined origin with abnormality of gait. He is walking with a rolling walker. Last MRA of the head January 2013 shows no significant stenosis, MRA of the neck shows mild stenosis of the proximal left internal carotid artery.    . Abnormality of gait 07/04/2012  . Dizziness and giddiness 07/04/2012  . Benign prostatic hyperplasia   . Edema   . Chorioretinitis   . Panuveitis   . Internal carotid artery stenosis     left  . Mild left ventricular hypertrophy   . Actinic keratoses   . Diverticulitis   . Prostatitis, acute     1991   Past Surgical History  Procedure Laterality Date  . Cardiac catheterization    . Coronary artery bypass graft  1991    grafts to LAD and RCA  . Appendectomy     Social History:   reports that he quit smoking about 35 years ago. He has never used smokeless tobacco. He reports that he does not drink alcohol or use illicit drugs.  Family History  Problem Relation Age of Onset  . Congestive Heart Failure Father   . Pneumonia Mother   . Hypertension Father   . Coronary artery disease Father   . Heart attack Father   . Diabetes Mother   . Diabetes Father   . Cancer Mother     colon  . Hypertension Mother   . Heart failure Father   . Diabetes Sister   . Cancer Sister     breast  . Coronary artery  disease Brother   . Heart disease Sister   . Diabetes Sister   . Kidney failure Sister   . Heart attack Sister     Medications: Patient's Medications  New Prescriptions   No medications on file  Previous Medications   ACETAMINOPHEN (TYLENOL) 325 MG TABLET    Take 650 mg by mouth every 6 (six) hours as needed.   ATORVASTATIN (LIPITOR) 40 MG TABLET    Take 40 mg by mouth daily.   AZELASTINE (ASTELIN) 137 MCG/SPRAY NASAL SPRAY       CETIRIZINE (ZYRTEC) 10 MG TABLET    Take 10 mg by mouth daily.   CLOPIDOGREL (PLAVIX) 75 MG TABLET    Take 75 mg by mouth daily.  DIGOXIN (LANOXIN) 0.125 MG TABLET    Take 0.125 mg by mouth daily.   FUROSEMIDE (LASIX) 20 MG TABLET    Take 20 mg by mouth daily. Taking 1 tablet every other day, and 2 pills = 40 mg on the other days   GLUCOSAMINE PO    Take by mouth. Taking 250 daily    L-METHYLFOLATE-B12-B6-B2 (CEREFOLIN PO)    Take by mouth daily.     LEVOTHYROXINE (SYNTHROID, LEVOTHROID) 88 MCG TABLET    Take 88 mcg by mouth daily before breakfast.   LISINOPRIL (PRINIVIL,ZESTRIL) 10 MG TABLET       LOSARTAN (COZAAR) 50 MG TABLET       MULTIPLE VITAMIN (MULTIVITAMIN) TABLET    Take 1 tablet by mouth daily.     MULTIVITAMIN (METANX) 3-35-2 MG TABS TABLET    Take 1 tablet by mouth daily.   MYCOPHENOLATE (CELLCEPT) 500 MG TABLET    Take by mouth 2 (two) times daily.   TAMSULOSIN HCL (FLOMAX) 0.4 MG CAPS    Take by mouth. Takes two daily    TRAMADOL (ULTRAM) 50 MG TABLET    Take 50 mg by mouth every 6 (six) hours as needed.  Modified Medications   No medications on file  Discontinued Medications   No medications on file     Physical Exam: Physical Exam  Constitutional: He appears well-developed and well-nourished. No distress.  HENT:  Head: Normocephalic and atraumatic.  Right Ear: External ear normal.  Left Ear: External ear normal.  Nose: Nose normal.  Mouth/Throat: Oropharynx is clear and moist. No oropharyngeal exudate.  Eyes: Conjunctivae and  EOM are normal. Pupils are equal, round, and reactive to light. Right eye exhibits no discharge. Left eye exhibits no discharge. No scleral icterus.  Mild. Crusty matter right eyelashes.    Neck: Normal range of motion. Neck supple. No JVD present. No tracheal deviation present. No thyromegaly present.  Cardiovascular: Normal rate and regular rhythm.  Exam reveals no gallop and no friction rub.   Murmur heard. Systolic murmur 3-5/4 right sternal border   Pulmonary/Chest: Effort normal and breath sounds normal. No stridor. No respiratory distress. He has no wheezes. He has no rales. He exhibits no tenderness.  Abdominal: Soft. Bowel sounds are normal. He exhibits no distension and no mass. There is no tenderness. There is no rebound and no guarding.  Musculoskeletal: Normal range of motion. He exhibits no edema or tenderness.  Lymphadenopathy:    He has no cervical adenopathy.  Neurological: He is alert. He has normal reflexes. No cranial nerve deficit. He exhibits normal muscle tone. Coordination normal.  Skin: Skin is warm and dry. No rash noted. He is not diaphoretic. No erythema. No pallor.  Psychiatric: He has a normal mood and affect. His behavior is normal. Judgment and thought content normal. Cognition and memory are impaired. He exhibits abnormal recent memory. He exhibits normal remote memory.    Filed Vitals:   08/08/14 2044  BP: 128/68  Pulse: 52  Temp: 97.8 F (36.6 C)  TempSrc: Tympanic  Resp: 18      Labs reviewed: Basic Metabolic Panel:  Recent Labs  12/02/13 03/01/14 05/27/14  NA 141 144 137  K 3.6 4.1 3.9  BUN 22* 29* 19  CREATININE 1.0 0.8 0.9  TSH 3.22  --  2.47   Liver Function Tests:  Recent Labs  12/02/13 03/01/14 05/27/14  AST 28 42* 18  ALT 32 60* 15  ALKPHOS 64 70 60   No results  for input(s): LIPASE, AMYLASE in the last 8760 hours. No results for input(s): AMMONIA in the last 8760 hours. CBC:  Recent Labs  12/02/13 03/01/14 05/27/14  WBC  4.2 11.4 4.9  HGB 11.4* 11.7* 10.7*  HCT 34* 36* 32*  PLT 214 419* 240   Lipid Panel: No results for input(s): CHOL, HDL, LDLCALC, TRIG, CHOLHDL, LDLDIRECT in the last 8760 hours.  Past Procedures:  01/29/13 MRI brain w/o CM:  IMPRESSION: Generalized atrophy. No acute abnormality   Assessment/Plan Memory loss SNF for care needs.  11/08/13 MMSE 17/30 03/01/14 MMSE 14/30 Tolerated Namenda well   Abnormality of gait Ambulates with walker for short distance and w/c to go further.   Hypothyroidism Takes Levothyroxine 64mcg daily. 12/02/13 TSH 3.225, 2.472 05/27/14   HTN (hypertension) Takes Losartan 50mg  and Furosemide 20mg /40mg  alternating dose. Controlled blood pressure.     CAD (coronary artery disease) of artery bypass graft No angina since admitted to SNF [facility]. F/u Cardiology. Continue Plavix and Atorvastatin for risk reduction.     Edema Not apparent, takes Furosemide 40mg  and 20mg  alternating dose  History of atrial fibrillation Heart rate is in control, takes Digoxin. Digoxin level 0.7 06/28/14    Peripheral autonomic neuropathy in disorders classified elsewhere a history of chronic sensory polyneuropathy of undetermined origin with abnormality of gait. He is walking with a rolling walker. Last MRA of the head January 2013 shows no significant stenosis, MRA of the neck shows mild stenosis of the proximal left internal carotid artery.  Takes Metanx, Tylenol 650mg  qam and q6h prn, Tramadol 50mg  bid, and Cellcept 500mg  bid. Ambulates with walker.  F/u Neurology   Hereditary and idiopathic peripheral neuropathy a history of chronic sensory polyneuropathy of undetermined origin with abnormality of gait. He is walking with a rolling walker. Last MRA of the head January 2013 shows no significant stenosis, MRA of the neck shows mild stenosis of the proximal left internal carotid artery.  Takes Metanx, Tylenol 650mg  qam and q6h prn, Tramadol 50mg  bid, and Cellcept 500mg   bid. Ambulates with walker.  F/u Neurology  Transient cerebral ischemia a posterior circulation TIA in October 2009. Takes Plavix and Lipitor for risk reduction. Continue to observe.     Benign prostatic hyperplasia No urinary retention. Takes Tamsulosin 0.8 mg daily.     Allergy 6/616 dc Azelastine nasal spray, Cetirizine, and Mucinex prn.  stable  Blepharitis of eyelid of right eye Much improved. Continue Erythromycin ophth oint nightly.       Family/ Staff Communication: observe the patient  Goals of Care: SNF  Labs/tests ordered: none

## 2014-08-08 NOTE — Progress Notes (Signed)
This encounter was created in error - please disregard.

## 2014-08-09 ENCOUNTER — Encounter: Payer: Self-pay | Admitting: Nurse Practitioner

## 2014-08-12 NOTE — Assessment & Plan Note (Signed)
a history of chronic sensory polyneuropathy of undetermined origin with abnormality of gait. He is walking with a rolling walker. Last MRA of the head January 2013 shows no significant stenosis, MRA of the neck shows mild stenosis of the proximal left internal carotid artery.  Takes Metanx, Tylenol 650mg  qam and q6h prn, Tramadol 50mg  bid, and Cellcept 500mg  bid. Ambulates with walker.  F/u Neurology

## 2014-08-12 NOTE — Assessment & Plan Note (Signed)
Takes Losartan 50mg  and Furosemide 20mg /40mg  alternating dose. Controlled blood pressure.

## 2014-08-12 NOTE — Assessment & Plan Note (Signed)
No angina since admitted to SNF [facility]. F/u Cardiology. Continue Plavix and Atorvastatin for risk reduction.

## 2014-08-12 NOTE — Assessment & Plan Note (Signed)
Heart rate is in control, takes Digoxin. Digoxin level 0.7 06/28/14

## 2014-08-12 NOTE — Assessment & Plan Note (Signed)
No urinary retention. Takes Tamsulosin 0.8 mg daily.

## 2014-08-12 NOTE — Assessment & Plan Note (Signed)
6/616 dc Azelastine nasal spray, Cetirizine, and Mucinex prn.  stable

## 2014-08-12 NOTE — Assessment & Plan Note (Signed)
SNF for care needs.  11/08/13 MMSE 17/30 03/01/14 MMSE 14/30 Tolerated Namenda well

## 2014-08-12 NOTE — Assessment & Plan Note (Signed)
Takes Levothyroxine 48mcg daily. 12/02/13 TSH 3.225, 2.472 05/27/14

## 2014-08-12 NOTE — Assessment & Plan Note (Signed)
Much improved. Continue Erythromycin ophth oint nightly.

## 2014-08-12 NOTE — Assessment & Plan Note (Signed)
Ambulates with walker for short distance and w/c to go further.

## 2014-08-12 NOTE — Assessment & Plan Note (Signed)
a posterior circulation TIA in October 2009. Takes Plavix and Lipitor for risk reduction. Continue to observe.

## 2014-08-12 NOTE — Assessment & Plan Note (Signed)
Not apparent, takes Furosemide 40mg  and 20mg  alternating dose

## 2014-10-10 ENCOUNTER — Non-Acute Institutional Stay (SKILLED_NURSING_FACILITY): Payer: Medicare Other | Admitting: Nurse Practitioner

## 2014-10-10 ENCOUNTER — Encounter: Payer: Self-pay | Admitting: Nurse Practitioner

## 2014-10-10 DIAGNOSIS — K5900 Constipation, unspecified: Secondary | ICD-10-CM | POA: Insufficient documentation

## 2014-10-10 DIAGNOSIS — R269 Unspecified abnormalities of gait and mobility: Secondary | ICD-10-CM

## 2014-10-10 DIAGNOSIS — R413 Other amnesia: Secondary | ICD-10-CM | POA: Diagnosis not present

## 2014-10-10 DIAGNOSIS — E039 Hypothyroidism, unspecified: Secondary | ICD-10-CM

## 2014-10-10 DIAGNOSIS — R609 Edema, unspecified: Secondary | ICD-10-CM

## 2014-10-10 DIAGNOSIS — G909 Disorder of the autonomic nervous system, unspecified: Secondary | ICD-10-CM | POA: Diagnosis not present

## 2014-10-10 DIAGNOSIS — Z8679 Personal history of other diseases of the circulatory system: Secondary | ICD-10-CM

## 2014-10-10 DIAGNOSIS — I10 Essential (primary) hypertension: Secondary | ICD-10-CM

## 2014-10-10 DIAGNOSIS — N400 Enlarged prostate without lower urinary tract symptoms: Secondary | ICD-10-CM | POA: Diagnosis not present

## 2014-10-10 DIAGNOSIS — G990 Autonomic neuropathy in diseases classified elsewhere: Secondary | ICD-10-CM

## 2014-10-10 DIAGNOSIS — I25700 Atherosclerosis of coronary artery bypass graft(s), unspecified, with unstable angina pectoris: Secondary | ICD-10-CM | POA: Diagnosis not present

## 2014-10-10 NOTE — Assessment & Plan Note (Signed)
Takes Losartan 50mg  and Furosemide 20mg /40mg  alternating dose. Allow permissive blood pressure control.

## 2014-10-10 NOTE — Assessment & Plan Note (Signed)
11/08/13 MMSE 17/30 03/01/14 MMSE 14/30 Tolerated Namenda well

## 2014-10-10 NOTE — Assessment & Plan Note (Signed)
BM followed MOM x1 10/08/14. Will try Miralax daily. Observe.

## 2014-10-10 NOTE — Progress Notes (Signed)
Patient ID: Jesse Burton, male   DOB: 15-May-1925, 81 y.o.   MRN: [id number]  Location:  SNF [facility] Provider:  Marlana Latus NP  Code Status:  DNR Goals of care: Advanced Directive information    Chief Complaint  Patient presents with  . Medical Management of Chronic Issues  . Acute Visit    constipation.      HPI: Patient is a 81 y.o. male seen in the SNF at [facility] today for evaluation of constipation, BM followed MOM 30mg  x1 10/08/14, denied abd pain, nausea, or vomiting. Hx of Afib, heart rate is controlled while on Digoxin, dependent edema is not apparent, takes Furosemide, CAD has been stable, no angina since last visit, hypothyroidism has been supplemented well with last TSH wnl 05/2014, HTN is controlled with Sbp in 150-160s, his memory has been gradual decline but functioning well in SNF, takes Namenda, peripheral neuropathy has been stable, he walks with walker for short distance and w/c to go further.   Review of Systems:  Review of Systems  Constitutional: Negative for fever, chills and diaphoresis.  HENT: Positive for hearing loss. Negative for congestion, ear discharge, ear pain, nosebleeds, sore throat and tinnitus.   Eyes: Negative for photophobia, pain, discharge and redness.  Respiratory: Negative for cough, shortness of breath, wheezing and stridor.   Cardiovascular: Positive for leg swelling. Negative for chest pain and palpitations.       Trace only.   Gastrointestinal: Positive for constipation. Negative for nausea, vomiting, abdominal pain, diarrhea and blood in stool.  Genitourinary: Positive for frequency. Negative for dysuria, urgency, hematuria and flank pain.  Musculoskeletal: Positive for back pain. Negative for myalgias and neck pain.       Ambulates with walker for short distance and w/c to go further.   Skin: Negative for rash.  Neurological: Negative for dizziness, tremors, seizures, weakness and headaches.       Peripheral neuropathy    Endo/Heme/Allergies: Negative for environmental allergies and polydipsia. Does not bruise/bleed easily.  Psychiatric/Behavioral: Negative for suicidal ideas and hallucinations. The patient is not nervous/anxious.        Mood has been stabilized.     Past Medical History  Diagnosis Date  . History of atrial fibrillation   . Thyroid disease   . Memory loss   . Stroke   . 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 Transient cerebral ischemia 07/04/2012    a posterior circulation TIA in October 2009.    Marland Kitchen HTN (hypertension) 12/01/2013  . CAD (coronary artery disease) of artery bypass graft 12/02/2013  . Hypothyroidism 12/01/2013  . Peripheral autonomic neuropathy in disorders classified elsewhere(337.1) 07/04/2012  . 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 Hereditary and idiopathic peripheral neuropathy 07/04/2012    a history of chronic sensory polyneuropathy of undetermined origin with abnormality of gait. He is walking with a rolling walker. Last MRA of the head January 2013 shows no significant stenosis, MRA of the neck shows mild stenosis of the proximal left internal carotid artery.    . Abnormality of gait 07/04/2012  . Dizziness and giddiness 07/04/2012  . Benign prostatic hyperplasia   . Edema   . Chorioretinitis   . Panuveitis   . Internal carotid artery stenosis     left  . Mild left ventricular hypertrophy   . Actinic keratoses   . Diverticulitis   . Prostatitis, acute     1991    Patient Active Problem List   Diagnosis Date Noted  . Constipation  10/10/2014  . Blepharitis of eyelid of right eye 02/24/2014  . Allergy 01/24/2014  . CAD (coronary artery disease) of artery bypass graft 12/02/2013  . Benign prostatic hyperplasia   . Edema   . Hypothyroidism 12/01/2013  . HTN (hypertension) 12/01/2013  . Memory loss 07/04/2012  . Peripheral autonomic neuropathy in disorders classified elsewhere 07/04/2012  . 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 Hereditary and idiopathic peripheral neuropathy 07/04/2012  . Abnormality of gait 07/04/2012  . Dizziness and giddiness 07/04/2012  . Transient cerebral ischemia 07/04/2012  . History of atrial fibrillation     Allergies  Allergen Reactions  . Sulfa Antibiotics     Medications: Patient's Medications  New Prescriptions   No medications on file  Previous Medications   ACETAMINOPHEN (TYLENOL) 325 MG TABLET    Take 650 mg by mouth every 6 (six) hours as needed.   ATORVASTATIN (LIPITOR) 40 MG TABLET    Take 40 mg by mouth daily.   AZELASTINE (ASTELIN) 137 MCG/SPRAY NASAL SPRAY       CETIRIZINE (ZYRTEC) 10 MG TABLET    Take 10 mg by mouth daily.   CLOPIDOGREL (PLAVIX) 75 MG TABLET    Take 75 mg by mouth daily.   DIGOXIN (LANOXIN) 0.125 MG TABLET    Take 0.125 mg by mouth daily.   FUROSEMIDE (LASIX) 20 MG TABLET    Take 20 mg by mouth daily. Taking 1 tablet every other day, and 2 pills = 40 mg on the other days   GLUCOSAMINE PO    Take by mouth. Taking 250 daily    L-METHYLFOLATE-B12-B6-B2 (CEREFOLIN PO)    Take by mouth daily.     LEVOTHYROXINE (SYNTHROID, LEVOTHROID) 88 MCG TABLET    Take 88 mcg by mouth daily before breakfast.   LISINOPRIL (PRINIVIL,ZESTRIL) 10 MG TABLET       LOSARTAN (COZAAR) 50 MG TABLET       MULTIPLE VITAMIN (MULTIVITAMIN) TABLET    Take 1 tablet by mouth daily.     MULTIVITAMIN (METANX) 3-35-2 MG TABS TABLET    Take 1 tablet by mouth daily.   MYCOPHENOLATE (CELLCEPT) 500 MG TABLET    Take by mouth 2 (two) times daily.   TAMSULOSIN HCL (FLOMAX) 0.4 MG CAPS    Take by mouth. Takes two daily    TRAMADOL (ULTRAM) 50 MG TABLET    Take 50 mg by mouth every 6 (six) hours as needed.  Modified Medications   No medications on file  Discontinued Medications   No medications on file    Physical Exam: Filed Vitals:   10/10/14 1422  BP: 162/84  Pulse: 62  Temp: 97.5 F (36.4 C)  TempSrc: Tympanic  Resp: 18   There  is no weight on file to calculate BMI.  Physical Exam  Constitutional: He appears well-developed and well-nourished. No distress.  HENT:  Head: Normocephalic and atraumatic.  Right Ear: External ear normal.  Left Ear: External ear normal.  Nose: Nose normal.  Mouth/Throat: Oropharynx is clear and moist. No oropharyngeal exudate.  Eyes: Conjunctivae and EOM are normal. Pupils are equal, round, and reactive to light. Right eye exhibits no discharge. Left eye exhibits no discharge. No scleral icterus.  Mild. Crusty matter right eyelashes.    Neck: Normal range of motion. Neck supple. No JVD present. No tracheal deviation present. No thyromegaly present.  Cardiovascular: Normal rate and regular rhythm.  Exam reveals no gallop and no friction rub.  Murmur heard. Systolic murmur 4-4/0 right sternal border   Pulmonary/Chest: Effort normal and breath sounds normal. No stridor. No respiratory distress. He has no wheezes. He has no rales. He exhibits no tenderness.  Abdominal: Soft. Bowel sounds are normal. He exhibits no distension and no mass. There is no tenderness. There is no rebound and no guarding.  Musculoskeletal: Normal range of motion. He exhibits edema. He exhibits no tenderness.  Trace edema BLE end of the day mostly.   Lymphadenopathy:    He has no cervical adenopathy.  Neurological: He is alert. He has normal reflexes. No cranial nerve deficit. He exhibits normal muscle tone. Coordination normal.  Skin: Skin is warm and dry. No rash noted. He is not diaphoretic. No erythema. No pallor.  Psychiatric: He has a normal mood and affect. His behavior is normal. Judgment and thought content normal. Cognition and memory are impaired. He exhibits abnormal recent memory. He exhibits normal remote memory.  Memory deficits.     Labs reviewed: Basic Metabolic Panel:  Recent Labs  12/02/13 03/01/14 05/27/14  NA 141 144 137  K 3.6 4.1 3.9  BUN 22* 29* 19  CREATININE 1.0 0.8 0.9     Liver Function Tests:  Recent Labs  12/02/13 03/01/14 05/27/14  AST 28 42* 18  ALT 32 60* 15  ALKPHOS 64 70 60    CBC:  Recent Labs  12/02/13 03/01/14 05/27/14  WBC 4.2 11.4 4.9  HGB 11.4* 11.7* 10.7*  HCT 34* 36* 32*  PLT 214 419* 240    Lab Results  Component Value Date   TSH 2.47 05/27/2014   No results found for: HGBA1C No results found for: CHOL, HDL, LDLCALC, LDLDIRECT, TRIG, CHOLHDL  Significant Diagnostic Results since last visit: none  Patient Care Team: Estill Dooms, MD as PCP - General (Internal Medicine) Zebedee Iba, MD as Referring Physician (Ophthalmology) Darlin Coco, MD as Consulting Physician (Cardiology) Garvin Fila, MD as Consulting Physician (Neurology) Festus Aloe, MD as Consulting Physician (Urology) Marilynne Halsted, MD as Referring Physician (Ophthalmology)  Assessment/Plan Problem List Items Addressed This Visit    Memory loss (Chronic)    11/08/13 MMSE 17/30 03/01/14 MMSE 14/30 Tolerated Namenda well      Abnormality of gait (Chronic)    Ambulates with walker for short distance and w/c to go further.        Hypothyroidism (Chronic)    Takes Levothyroxine 37mcg daily. Last TSH wnl in April 2016        HTN (hypertension) (Chronic)    Takes Losartan 50mg  and Furosemide 20mg /40mg  alternating dose. Allow permissive blood pressure control.          CAD (coronary artery disease) of artery bypass graft (Chronic)    No angina since last visit. F/u Cardiology. Continue Plavix and Atorvastatin for risk reduction.          Edema (Chronic)    Not apparent, continue Furosemide 40mg  and 20mg  alternating dose       History of atrial fibrillation    Heart rate is in control, continue Digoxin. Digoxin level 0.7 06/28/14        Peripheral autonomic neuropathy in disorders classified elsewhere    Ambulates with walker for shore and w/c to go further, continue  Metanx, Tylenol 650mg  qam and q6h prn, Tramadol  50mg  bid, and Cellcept 500mg  bid.  F/u Neurology       Benign prostatic hyperplasia    No urinary retention. cotninueTamsulosin 0.8 mg daily.  Constipation - Primary    BM followed MOM x1 10/08/14. Will try Miralax daily. Observe.           Family/ staff Communication: observe for s/s of constipation.   Labs/tests ordered:  none  ManXie Montanna Mcbain NP [facility] [address], [place] [address] On Call:  [phone] & follow prompts after 5pm & weekends Office Phone:  [phone] Office Fax:  [phone]

## 2014-10-10 NOTE — Assessment & Plan Note (Signed)
Takes Levothyroxine 59mcg daily. Last TSH wnl in April 2016

## 2014-10-10 NOTE — Assessment & Plan Note (Signed)
Ambulates with walker for short distance and w/c to go further.

## 2014-10-10 NOTE — Assessment & Plan Note (Signed)
No angina since last visit. F/u Cardiology. Continue Plavix and Atorvastatin for risk reduction.

## 2014-10-10 NOTE — Assessment & Plan Note (Signed)
No urinary retention. cotninueTamsulosin 0.8 mg daily.  

## 2014-10-10 NOTE — Assessment & Plan Note (Signed)
Not apparent, continue Furosemide 40mg  and 20mg  alternating dose

## 2014-10-10 NOTE — Assessment & Plan Note (Signed)
Ambulates with walker for shore and w/c to go further, continue  Metanx, Tylenol 650mg  qam and q6h prn, Tramadol 50mg  bid, and Cellcept 500mg  bid.  F/u Neurology

## 2014-10-10 NOTE — Progress Notes (Signed)
Patient ID: Jesse Burton, male   DOB: 11/30/1925, 81 y.o.   MRN: [id number]  Location:  SNF [facility] Provider:  Marlana Latus NP  Code Status:  DNR Goals of care: Advanced Directive information    Chief Complaint  Patient presents with  . Medical Management of Chronic Issues  . Acute Visit    constipation.      HPI: Patient is a 81 y.o. male seen in the SNF at [facility] today for evaluation of  Constipation, BM followed x1 MOM 43ml 10/08/14, denied abd pain, nausea, or vomiting today. Hx of   Review of Systems:  ROS  Past Medical History  Diagnosis Date  . Arthritis   . Thyroid disease   . Neuromuscular disorder   . Hypertension     There are no active problems to display for this patient.   Not on File  Medications: Patient's Medications   No medications on file    Physical Exam: Filed Vitals:   10/10/14 1415  BP: 162/84  Pulse: 62  Temp: 97.5 F (36.4 C)  TempSrc: Tympanic  Resp: 18   There is no height or weight on file to calculate BMI.  Physical Exam  Labs reviewed: Basic Metabolic Panel: No results for input(s): NA, K, CL, CO2, GLUCOSE, BUN, CREATININE, CALCIUM, MG, PHOS in the last 8760 hours.  Liver Function Tests: No results for input(s): AST, ALT, ALKPHOS, BILITOT, PROT, ALBUMIN in the last 8760 hours.  CBC: No results for input(s): WBC, NEUTROABS, HGB, HCT, MCV, PLT in the last 8760 hours.  No results found for: TSH No results found for: HGBA1C No results found for: CHOL, HDL, LDLCALC, LDLDIRECT, TRIG, CHOLHDL  Significant Diagnostic Results since last visit:   No care team member to display  Assessment/Plan Problem List Items Addressed This Visit    None       Family/ staff Communication: observe for constipation.    [facility] Raychell Holcomb NP [facility] [id number], [place] [address] On Call:  [phone] & follow prompts after 5pm & weekends Office Phone:   [phone] Office Fax:  [phone]    This encounter was created in error - please disregard.

## 2014-10-10 NOTE — Assessment & Plan Note (Signed)
Heart rate is in control, continue Digoxin. Digoxin level 0.7 06/28/14

## 2014-11-11 ENCOUNTER — Non-Acute Institutional Stay (SKILLED_NURSING_FACILITY): Payer: Medicare Other | Admitting: Internal Medicine

## 2014-11-11 ENCOUNTER — Encounter: Payer: Self-pay | Admitting: Internal Medicine

## 2014-11-11 DIAGNOSIS — C4490 Unspecified malignant neoplasm of skin, unspecified: Secondary | ICD-10-CM | POA: Diagnosis not present

## 2014-11-11 NOTE — Progress Notes (Signed)
Patient ID: Jesse Burton, male   DOB: 1925-11-24, 81 y.o.   MRN: [id number]    [place] Room Number: 23  Place of Service: SNF (31)     Allergies  Allergen Reactions  . Sulfa Antibiotics     Chief Complaint  Patient presents with  . Acute Visit    HPI:  Seen acutely for enlarging lesion of the skin on the right leg. Size has more than tripled in the last 2 months. Mild tenderness.  Medications: Patient's Medications  New Prescriptions   No medications on file  Previous Medications   ACETAMINOPHEN (TYLENOL) 325 MG TABLET    Take 650 mg by mouth every 6 (six) hours as needed. Take 2 tablets every day   CLOPIDOGREL (PLAVIX) 75 MG TABLET    Take 75 mg by mouth daily.   DIGOXIN (LANOXIN) 0.125 MG TABLET    Take 0.125 mg by mouth daily.   ERYTHROMYCIN OPHTHALMIC OINTMENT    Place 1 application into the right eye at bedtime.   FUROSEMIDE (LASIX) 20 MG TABLET    Take 20 mg by mouth daily. Taking 1 tablet every other day, and 2 pills = 40 mg on the other days   GLUCOSAMINE PO    Take by mouth. Taking 250 daily    L-METHYLFOLATE-B12-B6-B2 (CEREFOLIN PO)    Take by mouth daily.     LEVOTHYROXINE (SYNTHROID, LEVOTHROID) 88 MCG TABLET    Take 88 mcg by mouth daily before breakfast.   LOSARTAN (COZAAR) 50 MG TABLET    50 mg. Take one tablet daily   MEMANTINE (NAMENDA XR) 28 MG CP24 24 HR CAPSULE    Take 28 mg by mouth. Take one tablet daily for memory   MULTIPLE VITAMIN (MULTIVITAMIN) TABLET    Take 1 tablet by mouth daily.     MULTIVITAMIN (METANX) 3-35-2 MG TABS TABLET    Take 1 tablet by mouth daily.   POLYETHYLENE GLYCOL (MIRALAX / GLYCOLAX) PACKET    Take 17 g by mouth daily.   TRAMADOL (ULTRAM) 50 MG TABLET    Take 50 mg by mouth every 6 (six) hours as needed.  Modified Medications   No medications on file  Discontinued Medications   ATORVASTATIN (LIPITOR) 40 MG TABLET    Take 40 mg by mouth daily.   AZELASTINE (ASTELIN) 137 MCG/SPRAY NASAL  SPRAY       CETIRIZINE (ZYRTEC) 10 MG TABLET    Take 10 mg by mouth daily.   LISINOPRIL (PRINIVIL,ZESTRIL) 10 MG TABLET       MYCOPHENOLATE (CELLCEPT) 500 MG TABLET    Take by mouth 2 (two) times daily.   TAMSULOSIN HCL (FLOMAX) 0.4 MG CAPS    Take by mouth. Takes two daily      Review of Systems  Constitutional: Negative for fever, chills and diaphoresis.  HENT: Positive for hearing loss. Negative for congestion, ear discharge, ear pain, nosebleeds, sore throat and tinnitus.   Eyes: Negative for photophobia, pain, discharge and redness.  Respiratory: Negative for cough, shortness of breath, wheezing and stridor.   Cardiovascular: Positive for leg swelling. Negative for chest pain and palpitations.       Trace only.   Gastrointestinal: Negative for nausea, vomiting, abdominal pain, diarrhea, constipation and blood in stool.  Endocrine: Negative for polydipsia.  Genitourinary: Positive for frequency. Negative for dysuria, urgency, hematuria and flank pain.  Musculoskeletal: Positive for back pain. Negative for myalgias and neck pain.  Ambulates with walker for short distance and w/c to go further. Complains of right leg discomfort.  Skin: Negative for rash.       Enlarging lesion of the right lower leg.  Allergic/Immunologic: Negative for environmental allergies.  Neurological: Negative for dizziness, tremors, seizures, weakness and headaches.       Dementia  Hematological: Negative.  Does not bruise/bleed easily.  Psychiatric/Behavioral: Positive for confusion. Negative for suicidal ideas and hallucinations. The patient is nervous/anxious.     Filed Vitals:   11/11/14 1624  BP: 151/88  Pulse: 68  Temp: 97.5 F (36.4 C)  Resp: 18  Weight: 187 lb 8 oz (85.049 kg)   Body mass index is 25.42 kg/(m^2).  Physical Exam  Constitutional: He appears well-developed and well-nourished. No distress.  HENT:  Head: Normocephalic and atraumatic.  Right Ear: External ear normal.    Left Ear: External ear normal.  Nose: Nose normal.  Mouth/Throat: Oropharynx is clear and moist. No oropharyngeal exudate.  Eyes: Conjunctivae and EOM are normal. Pupils are equal, round, and reactive to light. Right eye exhibits no discharge. Left eye exhibits no discharge. No scleral icterus.  Neck: Normal range of motion. Neck supple. No JVD present. No tracheal deviation present. No thyromegaly present.  Cardiovascular: Normal rate and regular rhythm.  Exam reveals no gallop and no friction rub.   Murmur heard. Systolic murmur 0-6/2 right sternal border   Pulmonary/Chest: Effort normal and breath sounds normal. No stridor. No respiratory distress. He has no wheezes. He has no rales. He exhibits no tenderness.  Abdominal: Soft. Bowel sounds are normal. He exhibits no distension and no mass. There is no tenderness. There is no rebound and no guarding.  Musculoskeletal: Normal range of motion. He exhibits no edema or tenderness.  Lymphadenopathy:    He has no cervical adenopathy.  Neurological: He is alert. He has normal reflexes. No cranial nerve deficit. He exhibits normal muscle tone. Coordination normal.  Skin: Skin is warm and dry. No rash noted. He is not diaphoretic. No erythema. No pallor.  Approximately 1/2 inch diameter lesion in the right lower leg. There are pearly edges and there is a central umbilication with a crusty blackish material that is likely old blood.  Psychiatric: He has a normal mood and affect. His behavior is normal. Judgment and thought content normal. Cognition and memory are impaired. He exhibits abnormal recent memory. He exhibits normal remote memory.     Labs reviewed: Lab Summary Latest Ref Rng 05/27/2014 03/01/2014 12/02/2013  Hemoglobin 13.5 - 17.5 g/dL 10.7(A) 11.7(A) 11.4(A)  Hematocrit 41 - 53 % 32(A) 36(A) 34(A)  White count - 4.9 11.4 4.2  Platelet count 150 - 399 K/L 240 419(A) 214  Sodium 137 - 147 mmol/L 137 144 141  Potassium 3.4 - 5.3 mmol/L  3.9 4.1 3.6  Calcium - (None) (None) (None)  Phosphorus - (None) (None) (None)  Creatinine 0.6 - 1.3 mg/dL 0.9 0.8 1.0  AST 14 - 40 U/L 18 42(A) 28  Alk Phos 25 - 125 U/L 60 70 64  Bilirubin - (None) (None) (None)  Glucose - 91 96 90  Cholesterol - (None) (None) (None)  HDL cholesterol - (None) (None) (None)  Triglycerides - (None) (None) (None)  LDL Direct - (None) (None) (None)  LDL Calc - (None) (None) (None)  Total protein - (None) (None) (None)  Albumin - (None) (None) (None)   Lab Results  Component Value Date   TSH 2.47 05/27/2014   Lab Results  Component  Value Date   BUN 19 05/27/2014   No results found for: HGBA1C     Assessment/Plan  1. Skin cancer Suspicious for basal cell cancer. Needs to be excised. Has seen Dr. Denna Haggard in the past. His daughter will call Dr. Onalee Hua office.

## 2014-12-05 ENCOUNTER — Encounter: Payer: Self-pay | Admitting: Nurse Practitioner

## 2014-12-05 ENCOUNTER — Other Ambulatory Visit: Payer: Self-pay

## 2014-12-05 ENCOUNTER — Non-Acute Institutional Stay (SKILLED_NURSING_FACILITY): Payer: Medicare Other | Admitting: Nurse Practitioner

## 2014-12-05 DIAGNOSIS — G909 Disorder of the autonomic nervous system, unspecified: Secondary | ICD-10-CM | POA: Diagnosis not present

## 2014-12-05 DIAGNOSIS — N400 Enlarged prostate without lower urinary tract symptoms: Secondary | ICD-10-CM | POA: Diagnosis not present

## 2014-12-05 DIAGNOSIS — Z8679 Personal history of other diseases of the circulatory system: Secondary | ICD-10-CM | POA: Diagnosis not present

## 2014-12-05 DIAGNOSIS — E039 Hypothyroidism, unspecified: Secondary | ICD-10-CM | POA: Diagnosis not present

## 2014-12-05 DIAGNOSIS — R42 Dizziness and giddiness: Secondary | ICD-10-CM | POA: Diagnosis not present

## 2014-12-05 DIAGNOSIS — R609 Edema, unspecified: Secondary | ICD-10-CM | POA: Diagnosis not present

## 2014-12-05 DIAGNOSIS — I10 Essential (primary) hypertension: Secondary | ICD-10-CM | POA: Diagnosis not present

## 2014-12-05 DIAGNOSIS — R413 Other amnesia: Secondary | ICD-10-CM

## 2014-12-05 DIAGNOSIS — G990 Autonomic neuropathy in diseases classified elsewhere: Secondary | ICD-10-CM

## 2014-12-05 DIAGNOSIS — K5900 Constipation, unspecified: Secondary | ICD-10-CM | POA: Diagnosis not present

## 2014-12-05 NOTE — Assessment & Plan Note (Signed)
Heart rate is in control, continue Digoxin. Digoxin level 0.8 05/03/14

## 2014-12-05 NOTE — Progress Notes (Signed)
Patient ID: Jesse Burton, male   DOB: Aug 29, 1925, 81 y.o.   MRN: [id number]  Location:  SNF [facility] Provider:  Marlana Latus NP  Code Status:  DNR Goals of care: Advanced Directive information    Chief Complaint  Patient presents with  . Medical Management of Chronic Issues  . Acute Visit    near syncope episode     HPI: Patient is a 81 y.o. male seen in the SNF at [facility] today for evaluation of near syncope episode this am, denied chest pain, palpitation, LOC, falling, focal weakness, change in vision, VS unremarkable, no orthostatic Bp change.  Hx of Afib, heart rate is controlled while on Digoxin, dependent edema is not apparent, takes Furosemide, CAD has been stable, no angina since last visit, hypothyroidism has been supplemented well with last TSH wnl 05/2014, HTN is controlled with Sbp in 150-160s, his memory has been gradual decline but functioning well in SNF, takes Namenda, peripheral neuropathy has been stable, he walks with walker for short distance and w/c to go further.   Review of Systems:  Review of Systems  Constitutional: Negative for fever, chills and diaphoresis.  HENT: Positive for hearing loss. Negative for congestion and ear pain.   Eyes: Negative for pain, discharge and redness.  Respiratory: Negative for cough and shortness of breath.   Cardiovascular: Positive for leg swelling. Negative for chest pain and palpitations.       Trace only.   Gastrointestinal: Positive for constipation. Negative for nausea, vomiting, abdominal pain and diarrhea.  Genitourinary: Positive for frequency. Negative for dysuria and urgency.  Musculoskeletal: Positive for back pain. Negative for myalgias and neck pain.       Ambulates with walker for short distance and w/c to go further.   Skin: Negative for rash.  Neurological: Positive for dizziness. Negative for tremors, seizures, weakness and headaches.       Peripheral neuropathy  Psychiatric/Behavioral: Positive for  memory loss. Negative for suicidal ideas and hallucinations. The patient is not nervous/anxious.        Mood has been stabilized.     Past Medical History  Diagnosis Date  . History of atrial fibrillation   . Thyroid disease   . Memory loss   . Stroke ([place])   . 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 Transient cerebral ischemia 07/04/2012    a posterior circulation TIA in October 2009.    Marland Kitchen HTN (hypertension) 12/01/2013  . CAD (coronary artery disease) of artery bypass graft 12/02/2013  . Hypothyroidism 12/01/2013  . Peripheral autonomic neuropathy in disorders classified elsewhere(337.1) 07/04/2012  . 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 Hereditary and idiopathic peripheral neuropathy 07/04/2012    a history of chronic sensory polyneuropathy of undetermined origin with abnormality of gait. He is walking with a rolling walker. Last MRA of the head January 2013 shows no significant stenosis, MRA of the neck shows mild stenosis of the proximal left internal carotid artery.    . Abnormality of gait 07/04/2012  . Dizziness and giddiness 07/04/2012  . Benign prostatic hyperplasia   . Edema   . Chorioretinitis   . Panuveitis   . Internal carotid artery stenosis     left  . Mild left ventricular hypertrophy   . Actinic keratoses   . Diverticulitis   . Prostatitis, acute     1991    Patient Active Problem List   Diagnosis Date Noted  . Skin cancer 11/11/2014  . Constipation 10/10/2014  . Blepharitis  of eyelid of right eye 02/24/2014  . Allergy 01/24/2014  . CAD (coronary artery disease) of artery bypass graft 12/02/2013  . Benign prostatic hyperplasia   . Edema   . Hypothyroidism 12/01/2013  . HTN (hypertension) 12/01/2013  . Memory loss 07/04/2012  . Peripheral autonomic neuropathy in disorders classified elsewhere 07/04/2012  . 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 Hereditary and idiopathic  peripheral neuropathy 07/04/2012  . Abnormality of gait 07/04/2012  . Dizziness and giddiness 07/04/2012  . Transient cerebral ischemia 07/04/2012  . History of atrial fibrillation     Allergies  Allergen Reactions  . Sulfa Antibiotics     Medications: Patient's Medications  New Prescriptions   No medications on file  Previous Medications   ACETAMINOPHEN (TYLENOL) 325 MG TABLET    Take 650 mg by mouth every 6 (six) hours as needed. Take 2 tablets every day   CLOPIDOGREL (PLAVIX) 75 MG TABLET    Take 75 mg by mouth daily.   DIGOXIN (LANOXIN) 0.125 MG TABLET    Take 0.125 mg by mouth daily.   ERYTHROMYCIN OPHTHALMIC OINTMENT    Place 1 application into the right eye at bedtime.   FUROSEMIDE (LASIX) 20 MG TABLET    Take 20 mg by mouth daily. Taking 1 tablet every other day, and 2 pills = 40 mg on the other days   GLUCOSAMINE PO    Take by mouth. Taking 250 daily    L-METHYLFOLATE-B12-B6-B2 (CEREFOLIN PO)    Take by mouth daily.     LEVOTHYROXINE (SYNTHROID, LEVOTHROID) 88 MCG TABLET    Take 88 mcg by mouth daily before breakfast.   LOSARTAN (COZAAR) 50 MG TABLET    50 mg. Take one tablet daily   MEMANTINE (NAMENDA XR) 28 MG CP24 24 HR CAPSULE    Take 28 mg by mouth. Take one tablet daily for memory   MULTIPLE VITAMIN (MULTIVITAMIN) TABLET    Take 1 tablet by mouth daily.     MULTIVITAMIN (METANX) 3-35-2 MG TABS TABLET    Take 1 tablet by mouth daily.   POLYETHYLENE GLYCOL (MIRALAX / GLYCOLAX) PACKET    Take 17 g by mouth daily.   TRAMADOL (ULTRAM) 50 MG TABLET    Take 50 mg by mouth every 6 (six) hours as needed.  Modified Medications   No medications on file  Discontinued Medications   No medications on file    Physical Exam: Filed Vitals:   12/05/14 1117  BP: 108/68  Pulse: 74  Temp: 98.7 F (37.1 C)  TempSrc: Tympanic  Resp: 20   There is no weight on file to calculate BMI.  Physical Exam  Constitutional: He appears well-developed and well-nourished. No distress.    HENT:  Head: Normocephalic and atraumatic.  Right Ear: External ear normal.  Left Ear: External ear normal.  Nose: Nose normal.  Mouth/Throat: Oropharynx is clear and moist. No oropharyngeal exudate.  Eyes: Conjunctivae and EOM are normal. Pupils are equal, round, and reactive to light. Right eye exhibits no discharge. Left eye exhibits no discharge. No scleral icterus.  Mild. Crusty matter right eyelashes.    Neck: Normal range of motion. Neck supple. No JVD present. No tracheal deviation present. No thyromegaly present.  Cardiovascular: Normal rate and regular rhythm.  Exam reveals no gallop and no friction rub.   Murmur heard. Systolic murmur 9-2/9 right sternal border   Pulmonary/Chest: Effort normal and breath sounds normal. No stridor. No respiratory distress. He  has no wheezes. He has no rales. He exhibits no tenderness.  Abdominal: Soft. Bowel sounds are normal. He exhibits no distension and no mass. There is no tenderness.  Musculoskeletal: Normal range of motion. He exhibits edema.  Trace edema BLE end of the day mostly.   Neurological: He is alert. He has normal reflexes. No cranial nerve deficit. He exhibits normal muscle tone. Coordination normal.  Skin: Skin is warm and dry. No rash noted. He is not diaphoretic. No erythema. No pallor.  Psychiatric: He has a normal mood and affect. His behavior is normal. Judgment and thought content normal. Cognition and memory are impaired. He exhibits abnormal recent memory. He exhibits normal remote memory.  Memory deficits.     Labs reviewed: Basic Metabolic Panel:  Recent Labs  03/01/14 05/27/14  NA 144 137  K 4.1 3.9  BUN 29* 19  CREATININE 0.8 0.9    Liver Function Tests:  Recent Labs  03/01/14 05/27/14  AST 42* 18  ALT 60* 15  ALKPHOS 70 60    CBC:  Recent Labs  03/01/14 05/27/14  WBC 11.4 4.9  HGB 11.7* 10.7*  HCT 36* 32*  PLT 419* 240    Lab Results  Component Value Date   TSH 2.47 05/27/2014   No  results found for: HGBA1C No results found for: CHOL, HDL, LDLCALC, LDLDIRECT, TRIG, CHOLHDL  Significant Diagnostic Results since last visit: none  Patient Care Team: Estill Dooms, MD as PCP - General (Internal Medicine) Zebedee Iba, MD as Referring Physician (Ophthalmology) Darlin Coco, MD as Consulting Physician (Cardiology) Garvin Fila, MD as Consulting Physician (Neurology) Festus Aloe, MD as Consulting Physician (Urology) Marilynne Halsted, MD as Referring Physician (Ophthalmology) Lavonna Monarch, MD as Consulting Physician (Dermatology) Gerarda Fraction, MD as Referring Physician (Ophthalmology)  Assessment/Plan Problem List Items Addressed This Visit    Memory loss (Chronic)    11/08/13 MMSE 17/30 03/01/14 MMSE 14/30 Tolerated Namenda well      Hypothyroidism (Chronic)    Continue Levothyroxine 59mcg daily. Last TSH wnl in April 2016      HTN (hypertension) (Chronic)    Continue Losartan 50mg  and Furosemide 20mg /40mg  alternating dose. Allow permissive blood pressure control.       Edema - Primary (Chronic)    Not apparent, continue Furosemide 40mg  and 20mg  alternating dose, update CMP       History of atrial fibrillation    Heart rate is in control, continue Digoxin. Digoxin level 0.8 05/03/14      Peripheral autonomic neuropathy in disorders classified elsewhere    11/15/14 decrease Cellcept to 500mg  daily, then dc in 3 months-toxicity.       Dizziness and giddiness    Hx of it, recurrent this am, pale, generalized weakness, no change in speech, vision, LOC. Resolved without intervention. The patient is in his usual state of health. Will update CBC, CMP, UA C/S      Benign prostatic hyperplasia    No urinary retention. cotninueTamsulosin 0.8 mg daily.       Constipation    Stable, continue MiraLax daily.           Family/ staff Communication: observe for syncope.   Labs/tests ordered:  CBC, CMP, UA C/S  [facility]ast  NP Geriatric[facility] [address] [place],  [address] On Call:  [phone] & follow prompts after 5pm & weekends Office Phone:  [phone] Office Fax:  [phone]

## 2014-12-05 NOTE — Assessment & Plan Note (Signed)
Continue Levothyroxine 42mcg daily. Last TSH wnl in April 2016

## 2014-12-05 NOTE — Assessment & Plan Note (Signed)
Hx of it, recurrent this am, pale, generalized weakness, no change in speech, vision, LOC. Resolved without intervention. The patient is in his usual state of health. Will update CBC, CMP, UA C/S

## 2014-12-05 NOTE — Assessment & Plan Note (Signed)
11/08/13 MMSE 17/30 03/01/14 MMSE 14/30 Tolerated Namenda well 

## 2014-12-05 NOTE — Assessment & Plan Note (Signed)
No urinary retention. cotninueTamsulosin 0.8 mg daily.  

## 2014-12-05 NOTE — Assessment & Plan Note (Signed)
Not apparent, continue Furosemide 40mg  and 20mg  alternating dose, update CMP

## 2014-12-05 NOTE — Assessment & Plan Note (Signed)
11/15/14 decrease Cellcept to 500mg  daily, then dc in 3 months-toxicity.

## 2014-12-05 NOTE — Assessment & Plan Note (Signed)
Continue Losartan 50mg  and Furosemide 20mg /40mg  alternating dose. Allow permissive blood pressure control.

## 2014-12-05 NOTE — Assessment & Plan Note (Signed)
Stable, continue MiraLax daily.  

## 2014-12-06 LAB — CBC AND DIFFERENTIAL
HCT: 16 % — AB (ref 41–53)
Hemoglobin: 4.5 g/dL — AB (ref 13.5–17.5)
PLATELETS: 241 10*3/uL (ref 150–399)
WBC: 5.1 10*3/mL

## 2014-12-06 LAB — BASIC METABOLIC PANEL
BUN: 35 mg/dL — AB (ref 4–21)
Creatinine: 1.1 mg/dL (ref 0.6–1.3)
GLUCOSE: 95 mg/dL
POTASSIUM: 4.4 mmol/L (ref 3.4–5.3)
Sodium: 142 mmol/L (ref 137–147)

## 2014-12-06 LAB — HEPATIC FUNCTION PANEL
ALT: 14 U/L (ref 10–40)
AST: 16 U/L (ref 14–40)
Alkaline Phosphatase: 66 U/L (ref 25–125)
BILIRUBIN, TOTAL: 0.6 mg/dL

## 2014-12-07 ENCOUNTER — Other Ambulatory Visit: Payer: Self-pay | Admitting: Nurse Practitioner

## 2014-12-07 ENCOUNTER — Inpatient Hospital Stay (HOSPITAL_COMMUNITY): Payer: Medicare Other

## 2014-12-07 ENCOUNTER — Encounter (HOSPITAL_COMMUNITY): Payer: Self-pay

## 2014-12-07 ENCOUNTER — Inpatient Hospital Stay (HOSPITAL_COMMUNITY)
Admission: EM | Admit: 2014-12-07 | Discharge: 2014-12-21 | DRG: 329 | Disposition: A | Discharge status: Skilled Nursing Facility | Payer: Medicare Other | Attending: Internal Medicine | Admitting: Internal Medicine

## 2014-12-07 DIAGNOSIS — E785 Hyperlipidemia, unspecified: Secondary | ICD-10-CM | POA: Diagnosis present

## 2014-12-07 DIAGNOSIS — R609 Edema, unspecified: Secondary | ICD-10-CM | POA: Diagnosis not present

## 2014-12-07 DIAGNOSIS — R29898 Other symptoms and signs involving the musculoskeletal system: Secondary | ICD-10-CM | POA: Diagnosis not present

## 2014-12-07 DIAGNOSIS — C184 Malignant neoplasm of transverse colon: Principal | ICD-10-CM | POA: Diagnosis present

## 2014-12-07 DIAGNOSIS — I480 Paroxysmal atrial fibrillation: Secondary | ICD-10-CM | POA: Diagnosis not present

## 2014-12-07 DIAGNOSIS — Z9181 History of falling: Secondary | ICD-10-CM | POA: Diagnosis not present

## 2014-12-07 DIAGNOSIS — K5790 Diverticulosis of intestine, part unspecified, without perforation or abscess without bleeding: Secondary | ICD-10-CM | POA: Diagnosis present

## 2014-12-07 DIAGNOSIS — Z823 Family history of stroke: Secondary | ICD-10-CM

## 2014-12-07 DIAGNOSIS — Z5181 Encounter for therapeutic drug level monitoring: Secondary | ICD-10-CM | POA: Diagnosis not present

## 2014-12-07 DIAGNOSIS — K921 Melena: Secondary | ICD-10-CM | POA: Diagnosis present

## 2014-12-07 DIAGNOSIS — Z87891 Personal history of nicotine dependence: Secondary | ICD-10-CM | POA: Diagnosis not present

## 2014-12-07 DIAGNOSIS — H3093 Unspecified chorioretinal inflammation, bilateral: Secondary | ICD-10-CM

## 2014-12-07 DIAGNOSIS — E876 Hypokalemia: Secondary | ICD-10-CM | POA: Diagnosis not present

## 2014-12-07 DIAGNOSIS — I25810 Atherosclerosis of coronary artery bypass graft(s) without angina pectoris: Secondary | ICD-10-CM | POA: Diagnosis present

## 2014-12-07 DIAGNOSIS — R140 Abdominal distension (gaseous): Secondary | ICD-10-CM | POA: Diagnosis present

## 2014-12-07 DIAGNOSIS — F0390 Unspecified dementia without behavioral disturbance: Secondary | ICD-10-CM | POA: Diagnosis present

## 2014-12-07 DIAGNOSIS — D500 Iron deficiency anemia secondary to blood loss (chronic): Secondary | ICD-10-CM | POA: Diagnosis present

## 2014-12-07 DIAGNOSIS — I10 Essential (primary) hypertension: Secondary | ICD-10-CM | POA: Diagnosis present

## 2014-12-07 DIAGNOSIS — N400 Enlarged prostate without lower urinary tract symptoms: Secondary | ICD-10-CM | POA: Diagnosis present

## 2014-12-07 DIAGNOSIS — R195 Other fecal abnormalities: Secondary | ICD-10-CM | POA: Diagnosis present

## 2014-12-07 DIAGNOSIS — D509 Iron deficiency anemia, unspecified: Secondary | ICD-10-CM | POA: Diagnosis present

## 2014-12-07 DIAGNOSIS — I5033 Acute on chronic diastolic (congestive) heart failure: Secondary | ICD-10-CM | POA: Diagnosis not present

## 2014-12-07 DIAGNOSIS — Z882 Allergy status to sulfonamides status: Secondary | ICD-10-CM

## 2014-12-07 DIAGNOSIS — I110 Hypertensive heart disease with heart failure: Secondary | ICD-10-CM | POA: Diagnosis present

## 2014-12-07 DIAGNOSIS — E079 Disorder of thyroid, unspecified: Secondary | ICD-10-CM | POA: Diagnosis present

## 2014-12-07 DIAGNOSIS — D630 Anemia in neoplastic disease: Secondary | ICD-10-CM | POA: Diagnosis present

## 2014-12-07 DIAGNOSIS — R0902 Hypoxemia: Secondary | ICD-10-CM | POA: Diagnosis not present

## 2014-12-07 DIAGNOSIS — K922 Gastrointestinal hemorrhage, unspecified: Secondary | ICD-10-CM | POA: Diagnosis present

## 2014-12-07 DIAGNOSIS — I481 Persistent atrial fibrillation: Secondary | ICD-10-CM | POA: Diagnosis present

## 2014-12-07 DIAGNOSIS — Z01810 Encounter for preprocedural cardiovascular examination: Secondary | ICD-10-CM | POA: Diagnosis not present

## 2014-12-07 DIAGNOSIS — R0600 Dyspnea, unspecified: Secondary | ICD-10-CM | POA: Diagnosis not present

## 2014-12-07 DIAGNOSIS — M1990 Unspecified osteoarthritis, unspecified site: Secondary | ICD-10-CM | POA: Diagnosis present

## 2014-12-07 DIAGNOSIS — Z7902 Long term (current) use of antithrombotics/antiplatelets: Secondary | ICD-10-CM

## 2014-12-07 DIAGNOSIS — Z951 Presence of aortocoronary bypass graft: Secondary | ICD-10-CM | POA: Diagnosis not present

## 2014-12-07 DIAGNOSIS — K8020 Calculus of gallbladder without cholecystitis without obstruction: Secondary | ICD-10-CM | POA: Diagnosis present

## 2014-12-07 DIAGNOSIS — E039 Hypothyroidism, unspecified: Secondary | ICD-10-CM | POA: Diagnosis present

## 2014-12-07 DIAGNOSIS — I509 Heart failure, unspecified: Secondary | ICD-10-CM

## 2014-12-07 DIAGNOSIS — K6389 Other specified diseases of intestine: Secondary | ICD-10-CM | POA: Diagnosis not present

## 2014-12-07 DIAGNOSIS — R001 Bradycardia, unspecified: Secondary | ICD-10-CM | POA: Diagnosis not present

## 2014-12-07 DIAGNOSIS — I482 Chronic atrial fibrillation: Secondary | ICD-10-CM | POA: Diagnosis not present

## 2014-12-07 DIAGNOSIS — H3090 Unspecified chorioretinal inflammation, unspecified eye: Secondary | ICD-10-CM

## 2014-12-07 DIAGNOSIS — C189 Malignant neoplasm of colon, unspecified: Secondary | ICD-10-CM | POA: Diagnosis present

## 2014-12-07 DIAGNOSIS — R2681 Unsteadiness on feet: Secondary | ICD-10-CM | POA: Diagnosis not present

## 2014-12-07 DIAGNOSIS — J189 Pneumonia, unspecified organism: Secondary | ICD-10-CM | POA: Diagnosis not present

## 2014-12-07 DIAGNOSIS — M6281 Muscle weakness (generalized): Secondary | ICD-10-CM | POA: Diagnosis not present

## 2014-12-07 DIAGNOSIS — D72829 Elevated white blood cell count, unspecified: Secondary | ICD-10-CM

## 2014-12-07 DIAGNOSIS — K598 Other specified functional intestinal disorders: Secondary | ICD-10-CM | POA: Diagnosis not present

## 2014-12-07 DIAGNOSIS — D010 Carcinoma in situ of colon: Secondary | ICD-10-CM | POA: Diagnosis not present

## 2014-12-07 DIAGNOSIS — R531 Weakness: Secondary | ICD-10-CM | POA: Diagnosis present

## 2014-12-07 DIAGNOSIS — R1312 Dysphagia, oropharyngeal phase: Secondary | ICD-10-CM | POA: Diagnosis not present

## 2014-12-07 HISTORY — DX: Benign prostatic hyperplasia without lower urinary tract symptoms: N40.0

## 2014-12-07 HISTORY — DX: Unspecified chorioretinal inflammation, unspecified eye: H30.90

## 2014-12-07 HISTORY — DX: Atherosclerosis of coronary artery bypass graft(s) without angina pectoris: I25.810

## 2014-12-07 HISTORY — DX: Hyperlipidemia, unspecified: E78.5

## 2014-12-07 LAB — COMPREHENSIVE METABOLIC PANEL
ALT: 20 U/L (ref 17–63)
ANION GAP: 5 (ref 5–15)
AST: 21 U/L (ref 15–41)
Albumin: 3.6 g/dL (ref 3.5–5.0)
Alkaline Phosphatase: 79 U/L (ref 38–126)
BILIRUBIN TOTAL: 0.9 mg/dL (ref 0.3–1.2)
BUN: 32 mg/dL — ABNORMAL HIGH (ref 6–20)
CALCIUM: 8.6 mg/dL — AB (ref 8.9–10.3)
CO2: 25 mmol/L (ref 22–32)
Chloride: 110 mmol/L (ref 101–111)
Creatinine, Ser: 1.12 mg/dL (ref 0.61–1.24)
GFR, EST NON AFRICAN AMERICAN: 57 mL/min — AB (ref >60)
Glucose, Bld: 111 mg/dL — ABNORMAL HIGH (ref 65–99)
POTASSIUM: 4 mmol/L (ref 3.5–5.1)
Sodium: 140 mmol/L (ref 135–145)
Total Protein: 5.8 g/dL — ABNORMAL LOW (ref 6.5–8.1)

## 2014-12-07 LAB — CBC
HCT: 17.1 % — ABNORMAL LOW (ref 39.0–52.0)
HCT: 21.9 % — ABNORMAL LOW (ref 39.0–52.0)
HEMOGLOBIN: 6.7 g/dL — AB (ref 13.0–17.0)
Hemoglobin: 4.9 g/dL — CL (ref 13.0–17.0)
MCH: 22.4 pg — ABNORMAL LOW (ref 26.0–34.0)
MCH: 23.8 pg — AB (ref 26.0–34.0)
MCHC: 28.7 g/dL — ABNORMAL LOW (ref 30.0–36.0)
MCHC: 30.6 g/dL (ref 30.0–36.0)
MCV: 78.1 fL (ref 78.0–100.0)
MCV: 77.9 fL — AB (ref 78.0–100.0)
Platelets: 277 10*3/uL (ref 150–400)
Platelets: 262 10*3/uL (ref 150–400)
RBC: 2.19 MIL/uL — ABNORMAL LOW (ref 4.22–5.81)
RBC: 2.81 MIL/uL — AB (ref 4.22–5.81)
RDW: 16.8 % — ABNORMAL HIGH (ref 11.5–15.5)
RDW: 16 % — ABNORMAL HIGH (ref 11.5–15.5)
WBC: 4.9 10*3/uL (ref 4.0–10.5)
WBC: 5.2 10*3/uL (ref 4.0–10.5)

## 2014-12-07 LAB — PREPARE RBC (CROSSMATCH)

## 2014-12-07 LAB — LIPID PANEL
Cholesterol: 93 mg/dL (ref 0–200)
HDL: 36 mg/dL — ABNORMAL LOW (ref >40)
LDL Cholesterol: 47 mg/dL (ref 0–99)
Total CHOL/HDL Ratio: 2.6 RATIO
Triglycerides: 51 mg/dL (ref <150)
VLDL: 10 mg/dL (ref 0–40)

## 2014-12-07 LAB — CBC WITH DIFFERENTIAL/PLATELET
Basophils Absolute: 0 10*3/uL (ref 0.0–0.1)
Basophils Relative: 1 %
Eosinophils Absolute: 0.1 10*3/uL (ref 0.0–0.7)
Eosinophils Relative: 3 %
HEMATOCRIT: 16.5 % — AB (ref 39.0–52.0)
HEMOGLOBIN: 4.9 g/dL — AB (ref 13.0–17.0)
LYMPHS ABS: 1 10*3/uL (ref 0.7–4.0)
LYMPHS PCT: 23 %
MCH: 23 pg — ABNORMAL LOW (ref 26.0–34.0)
MCHC: 29.7 g/dL — AB (ref 30.0–36.0)
MCV: 77.5 fL — AB (ref 78.0–100.0)
MONO ABS: 0.5 10*3/uL (ref 0.1–1.0)
MONOS PCT: 12 %
NEUTROS ABS: 2.7 10*3/uL (ref 1.7–7.7)
Neutrophils Relative %: 62 %
Platelets: 273 10*3/uL (ref 150–400)
RBC: 2.13 MIL/uL — ABNORMAL LOW (ref 4.22–5.81)
RDW: 16.7 % — AB (ref 11.5–15.5)
WBC: 4.3 10*3/uL (ref 4.0–10.5)

## 2014-12-07 LAB — APTT: aPTT: 30 seconds (ref 24–37)

## 2014-12-07 LAB — BASIC METABOLIC PANEL
ANION GAP: 6 (ref 5–15)
BUN: 31 mg/dL — ABNORMAL HIGH (ref 6–20)
CALCIUM: 8.9 mg/dL (ref 8.9–10.3)
CHLORIDE: 111 mmol/L (ref 101–111)
CO2: 25 mmol/L (ref 22–32)
Creatinine, Ser: 1.04 mg/dL (ref 0.61–1.24)
GFR calc non Af Amer: >60 mL/min (ref >60)
Glucose, Bld: 104 mg/dL — ABNORMAL HIGH (ref 65–99)
Potassium: 3.9 mmol/L (ref 3.5–5.1)
Sodium: 142 mmol/L (ref 135–145)

## 2014-12-07 LAB — MRSA PCR SCREENING: MRSA BY PCR: NEGATIVE

## 2014-12-07 LAB — ABO/RH: ABO/RH(D): A POS

## 2014-12-07 LAB — VITAMIN B12: VITAMIN B 12: 2269 pg/mL — AB (ref 180–914)

## 2014-12-07 LAB — IRON AND TIBC
IRON: 7 ug/dL — AB (ref 45–182)
SATURATION RATIOS: 2 % — AB (ref 17.9–39.5)
TIBC: 406 ug/dL (ref 250–450)
UIBC: 399 ug/dL

## 2014-12-07 LAB — PROTIME-INR
INR: 1.26 (ref 0.00–1.49)
Prothrombin Time: 16 seconds — ABNORMAL HIGH (ref 11.6–15.2)

## 2014-12-07 LAB — FOLATE

## 2014-12-07 LAB — BRAIN NATRIURETIC PEPTIDE: B NATRIURETIC PEPTIDE 5: 630.8 pg/mL — AB (ref 0.0–100.0)

## 2014-12-07 LAB — FERRITIN: Ferritin: 6 ng/mL — ABNORMAL LOW (ref 24–336)

## 2014-12-07 LAB — RETICULOCYTES
RBC.: 2.1 MIL/uL — AB (ref 4.22–5.81)
Retic Count, Absolute: 42 10*3/uL (ref 19.0–186.0)
Retic Ct Pct: 2 % (ref 0.4–3.1)

## 2014-12-07 LAB — DIGOXIN LEVEL: Digoxin Level: 0.5 ng/mL — ABNORMAL LOW (ref 0.8–2.0)

## 2014-12-07 LAB — TSH: TSH: 1.519 u[IU]/mL (ref 0.350–4.500)

## 2014-12-07 MED ORDER — PROMETHAZINE HCL 25 MG PO TABS: 25.0000 mg | ORAL_TABLET | ORAL | Status: DC | PRN

## 2014-12-07 MED ORDER — ATORVASTATIN CALCIUM 40 MG PO TABS
40.0000 mg | ORAL_TABLET | Freq: Every day | ORAL | Status: DC
  Administered 2014-12-07 – 2014-12-14 (×8): 40 mg via ORAL
  Filled 2014-12-07 (×9): qty 1

## 2014-12-07 MED ORDER — GLUCOSAMINE-CHONDROITIN 750-600 MG PO TABS: 2.0000 | ORAL_TABLET | Freq: Every day | ORAL | Status: DC

## 2014-12-07 MED ORDER — SODIUM CHLORIDE 0.9 % IV SOLN
Freq: Once | INTRAVENOUS | Status: AC
  Administered 2014-12-07: 18:00:00 via INTRAVENOUS

## 2014-12-07 MED ORDER — LOSARTAN POTASSIUM 50 MG PO TABS
50.0000 mg | ORAL_TABLET | Freq: Every day | ORAL | Status: DC
  Administered 2014-12-07 – 2014-12-14 (×8): 50 mg via ORAL
  Filled 2014-12-07 (×9): qty 1

## 2014-12-07 MED ORDER — LEVOTHYROXINE SODIUM 88 MCG PO TABS
88.0000 ug | ORAL_TABLET | Freq: Every day | ORAL | Status: DC
  Administered 2014-12-07 – 2014-12-14 (×8): 88 ug via ORAL
  Filled 2014-12-07 (×9): qty 1

## 2014-12-07 MED ORDER — PANTOPRAZOLE SODIUM 40 MG IV SOLR
40.0000 mg | Freq: Two times a day (BID) | INTRAVENOUS | Status: DC
  Administered 2014-12-07 – 2014-12-13 (×13): 40 mg via INTRAVENOUS
  Filled 2014-12-07 (×13): qty 40

## 2014-12-07 MED ORDER — TRAMADOL HCL 50 MG PO TABS
50.0000 mg | ORAL_TABLET | Freq: Two times a day (BID) | ORAL | Status: DC
  Administered 2014-12-07 – 2014-12-14 (×13): 50 mg via ORAL
  Filled 2014-12-07 (×16): qty 1

## 2014-12-07 MED ORDER — MYCOPHENOLATE MOFETIL 250 MG PO CAPS
500.0000 mg | ORAL_CAPSULE | Freq: Every day | ORAL | Status: DC
  Administered 2014-12-07 – 2014-12-21 (×12): 500 mg via ORAL
  Filled 2014-12-07 (×15): qty 2

## 2014-12-07 MED ORDER — ONDANSETRON HCL 4 MG PO TABS: 4.0000 mg | ORAL_TABLET | Freq: Four times a day (QID) | ORAL | Status: DC | PRN

## 2014-12-07 MED ORDER — IOHEXOL 300 MG/ML  SOLN
25.0000 mL | INTRAMUSCULAR | Status: AC
  Administered 2014-12-07 (×2): 25 mL via ORAL

## 2014-12-07 MED ORDER — POLYETHYLENE GLYCOL 3350 17 G PO PACK: 17.0000 g | PACK | Freq: Every day | ORAL | Status: DC | PRN

## 2014-12-07 MED ORDER — IOHEXOL 300 MG/ML  SOLN
100.0000 mL | Freq: Once | INTRAMUSCULAR | Status: AC | PRN
Start: 2014-12-07 — End: 2014-12-07
  Administered 2014-12-07: 100 mL via INTRAVENOUS

## 2014-12-07 MED ORDER — ACETAMINOPHEN 325 MG PO TABS: 650.0000 mg | ORAL_TABLET | Freq: Four times a day (QID) | ORAL | Status: DC | PRN

## 2014-12-07 MED ORDER — LORAZEPAM 2 MG/ML IJ SOLN
1.0000 mg | Freq: Once | INTRAMUSCULAR | Status: AC
  Administered 2014-12-07: 1 mg via INTRAVENOUS
  Filled 2014-12-07: qty 1

## 2014-12-07 MED ORDER — CEREFOLIN 6-1-50-5 MG PO TABS: 1.0000 | ORAL_TABLET | Freq: Every day | ORAL | Status: DC

## 2014-12-07 MED ORDER — ERYTHROMYCIN 5 MG/GM OP OINT
1.0000 "application " | TOPICAL_OINTMENT | Freq: Every day | OPHTHALMIC | Status: DC
  Administered 2014-12-07 – 2014-12-20 (×13): 1 via OPHTHALMIC
  Filled 2014-12-07: qty 3.5

## 2014-12-07 MED ORDER — ADULT MULTIVITAMIN W/MINERALS CH
1.0000 | ORAL_TABLET | Freq: Every day | ORAL | Status: DC
  Administered 2014-12-07 – 2014-12-21 (×12): 1 via ORAL
  Filled 2014-12-07 (×15): qty 1

## 2014-12-07 MED ORDER — FUROSEMIDE 10 MG/ML IJ SOLN
20.0000 mg | Freq: Once | INTRAMUSCULAR | Status: AC
  Administered 2014-12-07: 20 mg via INTRAVENOUS

## 2014-12-07 MED ORDER — SODIUM CHLORIDE 0.9 % IV SOLN: INTRAVENOUS | Status: DC

## 2014-12-07 MED ORDER — FUROSEMIDE 10 MG/ML IJ SOLN
20.0000 mg | Freq: Once | INTRAMUSCULAR | Status: AC
  Administered 2014-12-07: 20 mg via INTRAVENOUS
  Filled 2014-12-07: qty 2

## 2014-12-07 MED ORDER — TAMSULOSIN HCL 0.4 MG PO CAPS
0.8000 mg | ORAL_CAPSULE | Freq: Every day | ORAL | Status: DC
  Administered 2014-12-07 – 2014-12-14 (×8): 0.8 mg via ORAL
  Filled 2014-12-07 (×9): qty 2

## 2014-12-07 MED ORDER — FUROSEMIDE 10 MG/ML IJ SOLN
40.0000 mg | Freq: Once | INTRAMUSCULAR | Status: DC
  Filled 2014-12-07: qty 4

## 2014-12-07 MED ORDER — SODIUM CHLORIDE 0.9 % IJ SOLN
3.0000 mL | Freq: Two times a day (BID) | INTRAMUSCULAR | Status: DC
  Administered 2014-12-07 – 2014-12-20 (×18): 3 mL via INTRAVENOUS

## 2014-12-07 MED ORDER — L-METHYLFOLATE-B6-B12 3-35-2 MG PO TABS
1.0000 | ORAL_TABLET | Freq: Every day | ORAL | Status: DC
  Administered 2014-12-07 – 2014-12-14 (×8): 1 via ORAL
  Filled 2014-12-07 (×9): qty 1

## 2014-12-07 MED ORDER — DIGOXIN 125 MCG PO TABS
0.1250 mg | ORAL_TABLET | Freq: Every day | ORAL | Status: DC
Start: 2014-12-07 — End: 2014-12-13
  Administered 2014-12-07 – 2014-12-13 (×7): 0.125 mg via ORAL
  Filled 2014-12-07 (×7): qty 1

## 2014-12-07 MED ORDER — ONDANSETRON HCL 4 MG/2ML IJ SOLN: 4.0000 mg | Freq: Four times a day (QID) | INTRAMUSCULAR | Status: DC | PRN

## 2014-12-07 MED ORDER — FUROSEMIDE 20 MG PO TABS: 20.0000 mg | ORAL_TABLET | Freq: Every day | ORAL | Status: DC

## 2014-12-07 NOTE — Care Management Note (Signed)
Case Management Note  Patient Details  Name: Jesse Burton MRN: [id number] Date of Birth: 1925-10-23  Subjective/Objective: 81 y/o m admitted w/GIB.Gi following-EGD in am.From SNF-[facility].                   Action/Plan:d/c plan SNF.   Expected Discharge Date:                  Expected Discharge Plan:     In-House Referral:     Discharge planning Services     Post Acute Care Choice:    Choice offered to:     DME Arranged:    DME Agency:     HH Arranged:    [place] Agency:     Status of Service:     Medicare Important Message Given:    Date Medicare IM Given:    Medicare IM give by:    Date Additional Medicare IM Given:    Additional Medicare Important Message give by:     If discussed at [place] of Stay Meetings, dates discussed:    Additional Comments:  Dessa Phi, RN 12/07/2014, 2:13 PM

## 2014-12-07 NOTE — ED Notes (Signed)
Patient arrives from [facility] via [facility] with need for medical evaluation of Hgb 4.5.  Per [facility]AR, had CBC checked twice yesterday and has been sent here for evaluation.

## 2014-12-07 NOTE — ED Notes (Signed)
Pt requesting to speak with physician about transfusion before signing consent for transfusion.

## 2014-12-07 NOTE — ED Notes (Signed)
Admitting physician at bedside notified pt and daughter requesting to speak with physician about transfusion before signing consent.

## 2014-12-07 NOTE — ED Notes (Signed)
Pt and family member (daughter) state provider did not discuss blood transfusion. Randal Buba, MD and Phoebe Sharps, [place] notified.

## 2014-12-07 NOTE — Progress Notes (Signed)
OT Cancellation Note  Patient Details Name: Jesse Burton MRN: [id number] DOB: Aug 23, 1925   Cancelled Treatment:    Reason Eval/Treat Not Completed: Medical issues which prohibited therapy  Medical issues which prohibited therapy. Hgb 4.9. Pt to receive transfusions  Frederik Standley, Thereasa Parkin 12/07/2014, 9:06 AM

## 2014-12-07 NOTE — ED Provider Notes (Signed)
CSN: [id number]     Arrival date & time 12/07/14  0058 History   First MD Initiated Contact with Patient 12/07/14 0106     Chief Complaint  Patient presents with  . Low hemoglobin     (Consider location/radiation/quality/duration/timing/severity/associated sxs/prior Treatment) HPI Comments: Patient presents from [facility] with complaint by nursing home staff of low hemoglobin that was detected on labs done yesterday. The patient states he feels fine. He does report seeing dark or black stools for the past while, unable to quantify duration. He denies pain, nausea, vomiting, lightheadedness, falls. Daughter reports the patient has been having DOE and weakness, unable to walk normal distances for patient. No syncope or falls reported.   The history is provided by the patient, the EMS personnel and the nursing home. No language interpreter was used.    Past Medical History  Diagnosis Date  . Arthritis   . Thyroid disease   . Neuromuscular disorder ([place])   . Hypertension    History reviewed. No pertinent past surgical history. No family history on file. Social History  Substance Use Topics  . Smoking status: Former Research scientist (life sciences)  . Smokeless tobacco: None  . Alcohol Use: No    Review of Systems  Constitutional: Negative for fever and chills.  HENT: Negative.   Respiratory: Negative.   Cardiovascular: Negative.   Gastrointestinal: Negative.   Musculoskeletal: Negative.   Skin: Negative.   Neurological: Negative.       Allergies  Sulfa antibiotics  Home Medications   Prior to Admission medications   Not on File   BP 137/63 mmHg  Pulse 70  Resp 16  SpO2 96% Physical Exam  Constitutional: He is oriented to person, place, and time. He appears well-developed and well-nourished.  Pleasant elderly gentleman, smiling, NAD.  HENT:  Head: Normocephalic.  Eyes:  Conjunctival pallor   Neck: Normal range of motion. Neck supple.  Cardiovascular: Normal rate and regular  rhythm.   Pulmonary/Chest: Effort normal and breath sounds normal. He has no wheezes. He has no rales.  Abdominal: Soft. Bowel sounds are normal. There is no tenderness. There is no rebound and no guarding.  Musculoskeletal: Normal range of motion.  Neurological: He is alert and oriented to person, place, and time.  Skin: Skin is warm and dry. No rash noted.  Psychiatric: He has a normal mood and affect.    ED Course  Procedures (including critical care time) Labs Review Labs Reviewed  CBC WITH DIFFERENTIAL/PLATELET  COMPREHENSIVE METABOLIC PANEL  PROTIME-INR  APTT  POC OCCULT BLOOD, ED  TYPE AND SCREEN   Results for orders placed or performed during the hospital encounter of 12/07/14  CBC with Differential  Result Value Ref Range   WBC 4.3 4.0 - 10.5 K/uL   RBC 2.13 (L) 4.22 - 5.81 MIL/uL   Hemoglobin 4.9 (LL) 13.0 - 17.0 g/dL   HCT 16.5 (L) 39.0 - 52.0 %   MCV 77.5 (L) 78.0 - 100.0 fL   MCH 23.0 (L) 26.0 - 34.0 pg   MCHC 29.7 (L) 30.0 - 36.0 g/dL   RDW 16.7 (H) 11.5 - 15.5 %   Platelets 273 150 - 400 K/uL   Neutrophils Relative % 62 %   Neutro Abs 2.7 1.7 - 7.7 K/uL   Lymphocytes Relative 23 %   Lymphs Abs 1.0 0.7 - 4.0 K/uL   Monocytes Relative 12 %   Monocytes Absolute 0.5 0.1 - 1.0 K/uL   Eosinophils Relative 3 %   Eosinophils Absolute 0.1  0.0 - 0.7 K/uL   Basophils Relative 1 %   Basophils Absolute 0.0 0.0 - 0.1 K/uL  Comprehensive metabolic panel  Result Value Ref Range   Sodium 140 135 - 145 mmol/L   Potassium 4.0 3.5 - 5.1 mmol/L   Chloride 110 101 - 111 mmol/L   CO2 25 22 - 32 mmol/L   Glucose, Bld 111 (H) 65 - 99 mg/dL   BUN 32 (H) 6 - 20 mg/dL   Creatinine, Ser 1.12 0.61 - 1.24 mg/dL   Calcium 8.6 (L) 8.9 - 10.3 mg/dL   Total Protein 5.8 (L) 6.5 - 8.1 g/dL   Albumin 3.6 3.5 - 5.0 g/dL   AST 21 15 - 41 U/L   ALT 20 17 - 63 U/L   Alkaline Phosphatase 79 38 - 126 U/L   Total Bilirubin 0.9 0.3 - 1.2 mg/dL   GFR calc non Af Amer 57 (L) >60 mL/min    GFR calc Af Amer >60 >60 mL/min   Anion gap 5 5 - 15  Protime-INR  Result Value Ref Range   Prothrombin Time 16.0 (H) 11.6 - 15.2 seconds   INR 1.26 0.00 - 1.49  APTT  Result Value Ref Range   aPTT 30 24 - 37 seconds  Reticulocytes  Result Value Ref Range   Retic Ct Pct 2.0 0.4 - 3.1 %   RBC. 2.10 (L) 4.22 - 5.81 MIL/uL   Retic Count, Manual 42.0 19.0 - 186.0 K/uL  Type and screen [place]  Result Value Ref Range   ABO/RH(D) A POS    Antibody Screen NEG    Sample Expiration 12/10/2014    Unit Number [id number]    Blood Component Type RED CELLS,LR    Unit division 00    Status of Unit ALLOCATED    Transfusion Status OK TO TRANSFUSE    Crossmatch Result Compatible    Unit Number [id number]    Blood Component Type RED CELLS,LR    Unit division 00    Status of Unit ALLOCATED    Transfusion Status OK TO TRANSFUSE    Crossmatch Result Compatible   Prepare RBC  Result Value Ref Range   Order Confirmation ORDER PROCESSED BY BLOOD BANK   ABO/Rh  Result Value Ref Range   ABO/RH(D) A POS     Imaging Review No results found. I have personally reviewed and evaluated these images and lab results as part of my medical decision-making.   EKG Interpretation None      MDM   Final diagnoses:  None    1. Anemia 2. GI bleeding  The patient has normal blood pressure and heart rate, reports being asymptomatic although daughter has noticed symptoms of fatigue and DOE. Suspect blood loss chronic over time. Blood transfusion ordered. Plan discussed with the patient and family. [facility] accepts the patient for admission.    Charlann Lange, PA-C 12/07/14 6720  April Palumbo, MD 12/07/14 [phone]

## 2014-12-07 NOTE — Progress Notes (Signed)
PT Cancellation Note  Patient Details Name: Jesse Burton MRN: [id number] DOB: 04-10-1925   Cancelled Treatment:    Reason Eval/Treat Not Completed: Medical issues which prohibited therapy. Hgb 4.9. Pt to receive transfusions. Will hold PT today and check back on tomorrow. Thanks.    Weston Anna, MPT Pager: [phone]

## 2014-12-07 NOTE — ED Notes (Signed)
[place], PA at bedside.  

## 2014-12-07 NOTE — H&P (Addendum)
[facility] History and Physical  Thadd Apuzzo QIH:[id number] DOB: [birth year] DOA: 12/07/2014  Referring physician: ED physician PCP: No primary care provider on file.  Specialists:   Chief Complaint: Low hemoglobin  HPI: Jesse Burton is a 81 y.o. male with PMH of hypertension, hyperlipidemia, hypothyroidism, arthritis, CAD (as A/P CABG), retinitis, BPH, atrial fibrillation (not on anticoagulant), who presents with low hemoglobin.  Patient was brought in ED after he was found to have low Hgb of 4.5 in [place]. Patient reports seeing dark or black stools for the past while, but is unable to quantify duration. No hematuria.The patient states he feels fine, no chest pain, shortness of breath, dizziness, abdominal pain, nausea, vomiting or diarrhea currently, but daughter reports the patient has been having DOE, weakness, is unable to walk normal distances recently, looks pale. Daughter also reports that patient had nausea and vomiting in last week which have resolved completely now. Patient denies symptoms of UTI, unilateral weakness, rashes, cough, fever or chills. Daughter states that patient had colonoscopy many years ago which was normal.  In ED, patient was found to have expanded abdomen, positive FOBT, hgb 4.9, INR 1.26, PTT 30, creatinine 1.12, BUN 32, no tachycardia, electrolytes okay. Patient is admitted to inpatient for further evaluation and treatment.  Where does patient live?  SNF     Can patient participate in ADLs?   None  Review of Systems:   General: no fevers, chills, no changes in body weight, has fatigue HEENT: no blurry vision, hearing changes or sore throat Pulm: no dyspnea, coughing, wheezing CV: no chest pain, palpitations Abd: no nausea, vomiting, abdominal pain, diarrhea, constipation GU: no dysuria, burning on urination, increased urinary frequency, hematuria  Ext: no leg edema Neuro: no unilateral weakness, numbness, or tingling, no  vision change or hearing loss Skin: no rash MSK: No muscle spasm, no deformity, no limitation of range of movement in spin Heme: No easy bruising.  Travel history: No recent long distant travel.  Allergy:  Allergies  Allergen Reactions  . Sulfa Antibiotics Other (See Comments)    Patient states he does not recall an allergy to sulfa    Past Medical History  Diagnosis Date  . Arthritis   . Thyroid disease   . Neuromuscular disorder ([place])   . Hypertension   . Hypothyroidism   . BPH (benign prostatic hyperplasia)   . Retinitis   . HLD (hyperlipidemia)   . CAD (coronary artery disease) of bypass graft     Past Surgical History  Procedure Laterality Date  . Coronary artery bypass graft      Social History:  reports that he has quit smoking. He does not have any smokeless tobacco history on file. He reports that he does not drink alcohol. His drug history is not on file.  Family History:  Family History  Problem Relation Age of Onset  . Stroke Father      Prior to Admission medications   Not on File    Physical Exam: Filed Vitals:   12/07/14 0103 12/07/14 0300 12/07/14 0356 12/07/14 0450  BP: 137/63 129/64 137/77 127/68  Pulse: 70 75 82 79  Temp:   97.7 F (36.5 C) 97.8 F (36.6 C)  TempSrc:   Oral Oral  Resp: 16 16 16 16   Height:   6' (1.829 m)   Weight:   90.5 kg (199 lb 8.3 oz)   SpO2: 96% 95% 100% 99%   General: Not in acute distress. Pale  looking. HEENT:       Eyes: PERRL, EOMI, no scleral icterus.       ENT: No discharge from the ears and nose, no pharynx injection, no tonsillar enlargement.        Neck: No JVD, no bruit, no mass felt. Heme: No neck lymph node enlargement. Cardiac: S1/S2, RRR, No murmurs, No gallops or rubs. Pulm:  No rales, wheezing, rhonchi or rubs. Abd: Soft, extended, nontender, no rebound pain, no organomegaly, BS present. Ext: No pitting leg edema bilaterally. 2+DP/PT pulse bilaterally. Musculoskeletal: No joint deformities, No  joint redness or warmth, no limitation of ROM in spin. Skin: No rashes.  Neuro: Alert, oriented X3, cranial nerves II-XII grossly intact, muscle strength 5/5 in all extremities, sensation to light touch intact.  Psych: Patient is not psychotic, no suicidal or hemocidal ideation.  Labs on Admission:  Basic Metabolic Panel:  Recent Labs Lab 12/07/14 0144  NA 140  K 4.0  CL 110  CO2 25  GLUCOSE 111*  BUN 32*  CREATININE 1.12  CALCIUM 8.6*   Liver Function Tests:  Recent Labs Lab 12/07/14 0144  AST 21  ALT 20  ALKPHOS 79  BILITOT 0.9  PROT 5.8*  ALBUMIN 3.6   No results for input(s): LIPASE, AMYLASE in the last 168 hours. No results for input(s): AMMONIA in the last 168 hours. CBC:  Recent Labs Lab 12/07/14 0144 12/07/14 0423  WBC 4.3 4.9  NEUTROABS 2.7  --   HGB 4.9* 4.9*  HCT 16.5* 17.1*  MCV 77.5* 78.1  PLT 273 277   Cardiac Enzymes: No results for input(s): CKTOTAL, CKMB, CKMBINDEX, TROPONINI in the last 168 hours.  BNP (last 3 results) No results for input(s): BNP in the last 8760 hours.  ProBNP (last 3 results) No results for input(s): PROBNP in the last 8760 hours.  CBG: No results for input(s): GLUCAP in the last 168 hours.  Radiological Exams on Admission: No results found.  EKG: Not done in ED, will get one.   Assessment/Plan Principal Problem:   GIB (gastrointestinal bleeding) Active Problems:   Guaiac positive stools   Microcytic anemia   Arthritis   Hypertension   Hypothyroidism   Retinitis   Abdominal distention   Essential hypertension   A-fib (HCC)  GIB (gastrointestinal bleeding): FOBT positive. Hgb 4.9 (no old record). Patient has dark or black stool recently. He had normal colonoscopy many years ago per his daughter. Patient has weakness and shortness of breath on exertion per his daughter.  - will admit to tele bed - NPO  - NS at 75 mL/hr - Start IV pantoprazole 40 mg bib - Zofran IV for nausea - Avoid NSAIDs and SQ  heparin - Maintain IV access (2 large bore IVs if possible). - Monitor closely and follow q6h cbc, transfuse as necessary. - 2 units of PRBC were ordered by ED - I called GI in AM, will see today  Sympathetic microcytic anemia: Hemoglobin 4.9. Need to r/o colon cancer -Anemia panel -see above   Abdominal distention: Etiology is not clear. Patient does not have abdominal pain. -will get Ct-abd/pelvis  HLD: Last LDL was not on records -Continue home medications: Lipitor -Check FLP  Hypothyroidism: Last TSH was not on records -Continue home Synthroid -Check TSH  HTN: -Hold lasix (not sure whether pt has CHF)-->check BNP -continue cozaar  Addendum: BNP comes back 630-->indicating possible CHF -will give one dose of lasix 20 mg x 1 by IV since pt is getting 2 units of  blood now. -start oral lasix 20 mg daily from tomorrow. - d/c IVF  CAD: s/p of CABG. No chest pain -check EKG -hold plavix due to GIB  Arthritis: -Tylenol and tramadol  Retinitis: - On cellcept - continue home eye drops    Atrial Fibrillation: CHA2DS2-VASc Score is 3, needs oral anticoagulation. Patient is not AC at home, reason is not clear, possibly due to old age and high risk of fall. Now he has GIB, will not start AC. Heart rate is well controlled. -continue digoxin and check its level -tele bed  BPH: stable - Continue Flomax  DVT ppx: SCD  Code Status: Full code Family Communication: Yes, patient's duaghter at bed side Disposition Plan: Admit to inpatient   Date of Service 12/07/2014    Ivor Costa [facility] Pager [phone]  If 7PM-7AM, please contact night-coverage [email or web address] Password [email or web address] 12/07/2014, 5:21 AM

## 2014-12-07 NOTE — Progress Notes (Signed)
CRITICAL VALUE ALERT  Critical value received:  Hemoglobin-6.7  Date of notification:  12/07/14  Time of notification:  3220  Critical value read back:No.  Nurse who received alert:  Sheffield Slider  MD notified (1st page):  Dr. Broadus John  Time of first page:  1520  MD notified (2nd page):  Time of second page:  Responding MD:  Dr. Broadus John  Time MD responded:  [phone]

## 2014-12-07 NOTE — Consult Note (Signed)
Consultation  Referring Provider: [facility] Primary Care Physician:  [facility] Primary Gastroenterologist:  none    Reason for Consult;  profound anemia  HPI: Jesse Burton is a 81 y.o. male admitted through the emergency room earlier today. Patient is a resident of [facility] and apparently had routine labs done showing a hemoglobin of 5. He was then referred to the emergency room for admission. Patient's daughter related that he has had some increased shortness breath with exertion recently Patient reports dark stools but had been unaware of any bleeding. He denies any abdominal pain, changes in bowel habits, dysphagia or odynophagia. No nausea or vomiting. He has been on Plavix. Medical problems include hypertension, hyperlipidemia, coronary artery disease status post CABG, BPH and atrial fibrillation. On admit here hemoglobin 4.9, hematocrit 16.5, MCV of 77, platelets 273, BMI and 32, creatinine 1.12 Serum iron 7/TIBC 406/iron sat 2. Stool occult blood is pending.  No prior endoscopic eval in epic records. Pt unaware of any prior colonoscopy or EGD.   Past Medical History  Diagnosis Date  . Arthritis   . Thyroid disease   . Neuromuscular disorder ([place])   . Hypertension   . Hypothyroidism   . BPH (benign prostatic hyperplasia)   . Retinitis   . HLD (hyperlipidemia)   . CAD (coronary artery disease) of bypass graft     Past Surgical History  Procedure Laterality Date  . Coronary artery bypass graft      Prior to Admission medications   Medication Sig Start Date End Date Taking? Authorizing Provider  acetaminophen (TYLENOL) 325 MG tablet Take 650 mg by mouth daily.   Yes Historical Provider, MD  acetaminophen (TYLENOL) 325 MG tablet Take 650 mg by mouth every 6 (six) hours as needed (for mild pain.).   Yes Historical Provider, MD  atorvastatin (LIPITOR) 40 MG tablet Take 40 mg by mouth daily.   Yes Historical Provider, MD  clopidogrel  (PLAVIX) 75 MG tablet Take 75 mg by mouth daily.   Yes Historical Provider, MD  digoxin (LANOXIN) 0.125 MG tablet Take 0.125 mg by mouth daily.   Yes Historical Provider, MD  erythromycin ophthalmic ointment Place 1 application into the right eye at bedtime.   Yes Historical Provider, MD  furosemide (LASIX) 20 MG tablet Take 20 mg by mouth every other day.   Yes Historical Provider, MD  furosemide (LASIX) 40 MG tablet Take 40 mg by mouth every other day.   Yes Historical Provider, MD  Glucosamine-Chondroitin 750-600 MG TABS Take 2 tablets by mouth daily.   Yes Historical Provider, MD  L-Methylfolate-B12-B6-B2 (CEREFOLIN) 07-19-48-5 MG TABS Take 1 tablet by mouth daily.   Yes Historical Provider, MD  l-methylfolate-B6-B12 (METANX) 3-35-2 MG TABS tablet Take 1 tablet by mouth daily.   Yes Historical Provider, MD  levothyroxine (SYNTHROID, LEVOTHROID) 88 MCG tablet Take 88 mcg by mouth daily before breakfast.   Yes Historical Provider, MD  losartan (COZAAR) 50 MG tablet Take 50 mg by mouth daily.   Yes Historical Provider, MD  Multiple Vitamin (MULTIVITAMIN WITH MINERALS) TABS tablet Take 1 tablet by mouth daily. [place]   Yes Historical Provider, MD  mycophenolate (CELLCEPT) 500 MG tablet Take 500 mg by mouth daily.   Yes Historical Provider, MD  polyethylene glycol (MIRALAX / GLYCOLAX) packet Take 17 g by mouth daily.   Yes Historical Provider, MD  promethazine (PHENERGAN) 25 MG tablet Take 25 mg by mouth every 4 (four) hours as  needed for nausea or vomiting.   Yes Historical Provider, MD  tamsulosin (FLOMAX) 0.4 MG CAPS capsule Take 0.8 mg by mouth daily.   Yes Historical Provider, MD  traMADol (ULTRAM) 50 MG tablet Take 50 mg by mouth 2 (two) times daily.   Yes Historical Provider, MD    Current Facility-Administered Medications  Medication Dose Route Frequency Provider Last Rate Last Dose  . acetaminophen (TYLENOL) tablet 650 mg  650 mg Oral Q6H PRN Ivor Costa, MD      . atorvastatin  (LIPITOR) tablet 40 mg  40 mg Oral q1800 Ivor Costa, MD      . digoxin Fon[facility]) tablet 0.125 mg  0.125 mg Oral Daily Ivor Costa, MD   0.125 mg at 12/07/14 0917  . erythromycin ophthalmic ointment 1 application  1 application Right Eye QHS Ivor Costa, MD      . Derrill Memo ON 12/08/2014] furosemide (LASIX) tablet 20 mg  20 mg Oral Daily Ivor Costa, MD      . l-methylfolate-B6-B12 [facility]) 3-35-2 MG per tablet 1 tablet  1 tablet Oral Daily Ivor Costa, MD   1 tablet at 12/07/14 0910  . levothyroxine (SYNTHROID, LEVOTHROID) tablet 88 mcg  88 mcg Oral QAC breakfast Ivor Costa, MD   88 mcg at 12/07/14 0753  . losartan (COZAAR) tablet 50 mg  50 mg Oral Daily Ivor Costa, MD   50 mg at 12/07/14 0911  . multivitamin with minerals tablet 1 tablet  1 tablet Oral Daily Ivor Costa, MD   1 tablet at 12/07/14 0909  . mycophenolate (CELLCEPT) capsule 500 mg  500 mg Oral Daily Ivor Costa, MD   500 mg at 12/07/14 0909  . ondansetron (ZOFRAN) tablet 4 mg  4 mg Oral Q6H PRN Ivor Costa, MD       Or  . ondansetron [facility]) injection 4 mg  4 mg Intravenous Q6H PRN Ivor Costa, MD      . pantoprazole (PROTONIX) injection 40 mg  40 mg Intravenous Q12H Ivor Costa, MD   40 mg at 12/07/14 0908  . polyethylene glycol (MIRALAX / GLYCOLAX) packet 17 g  17 g Oral Daily PRN Ivor Costa, MD      . promethazine (PHENERGAN) tablet 25 mg  25 mg Oral Q4H PRN Ivor Costa, MD      . sodium chloride 0.9 % injection 3 mL  3 mL Intravenous Q12H Ivor Costa, MD   3 mL at 12/07/14 0910  . tamsulosin (FLOMAX) capsule 0.8 mg  0.8 mg Oral Daily Ivor Costa, MD   0.8 mg at 12/07/14 0909  . traMADol (ULTRAM) tablet 50 mg  50 mg Oral BID Ivor Costa, MD   50 mg at 12/07/14 0908    Allergies as of 12/07/2014 - Review Complete 12/07/2014  Allergen Reaction Noted  . Sulfa antibiotics Other (See Comments) 12/07/2014    Family History  Problem Relation Age of Onset  . Stroke Father     Social History   Social History  . Marital Status: Widowed    Spouse Name: N/A    . Number of Children: N/A  . Years of Education: N/A   Occupational History  . Not on file.   Social History Main Topics  . Smoking status: Former Research scientist (life sciences)  . Smokeless tobacco: Not on file  . Alcohol Use: No  . Drug Use: Not on file  . Sexual Activity: Not on file   Other Topics Concern  . Not on file   Social History Narrative  Review of Systems: Pertinent positive and negative review of systems were noted in the above HPI section.  All other review of systems was otherwise negative.  Physical Exam: Vital signs in last 24 hours: Temp:  [97.7 F (36.5 C)-98.6 F (37 C)] 98.6 F (37 C) (10/19 0908) Pulse Rate:  [69-82] 69 (10/19 0908) Resp:  [15-16] 15 (10/19 0908) BP: (127-142)/(63-93) 140/93 mmHg (10/19 0908) SpO2:  [95 %-100 %] 100 % (10/19 0908) Weight:  [199 lb 8.3 oz (90.5 kg)] 199 lb 8.3 oz (90.5 kg) (10/19 0356) Last BM Date: 12/06/14 General:   Alert,  Well-developed,elderly male,cooperative in NAD Head:  Normocephalic and atraumatic. Eyes:  Sclera clear, no icterus.   Conjunctiva i pale. Ears:  Normal auditory acuity. Nose:  No deformity, discharge,  or lesions. Mouth:  No deformity or lesions.   Neck:  Supple; no masses or thyromegaly. Lungs:  Clear throughout to auscultation.   No wheezes, crackles, or rhonchi. Heart:  Regular rate and rhythm; no murmurs, clicks, rubs,  or gallops. Abdomen:  Soft,nontender, BS active,nonpalp mass or hsm.   Rectal:  Deferred -FOBT pending Msk:  Symmetrical without gross deformities. . Pulses:  Normal pulses noted. Extremities:  Without clubbing or edema. Neurologic:  Alert and  oriented x4;  grossly normal neurologically. Skin:  Intact without significant lesions or rashes.. Psych:  Alert and cooperative. Normal mood and affect.  Intake/Output from previous day:   Intake/Output this shift: Total I/O In: 390 [Blood:390] Out: 300 [Urine:300]  Lab Results:  Recent Labs  12/07/14 0144 12/07/14 0423  WBC 4.3 4.9   HGB 4.9* 4.9*  HCT 16.5* 17.1*  PLT 273 277   BMET  Recent Labs  12/07/14 0144 12/07/14 0423  NA 140 142  K 4.0 3.9  CL 110 111  CO2 25 25  GLUCOSE 111* 104*  BUN 32* 31*  CREATININE 1.12 1.04  CALCIUM 8.6* 8.9   LFT  Recent Labs  12/07/14 0144  PROT 5.8*  ALBUMIN 3.6  AST 21  ALT 20  ALKPHOS 79  BILITOT 0.9   PT/INR  Recent Labs  12/07/14 0144  LABPROT 16.0*  INR 1.26     IMPRESSION:   #1 81 yo male with profound iron deficiency anemia in setting of chronic antiplatelet therapy- suspect slow subacute GI blood loss- R/O occult colon malignancy, occult UGI lesion ,AVM's etc. #2 hx atrial fib #3 CAD s/p CABG #4 HTN   Plan; Hold Plavix  Transfuse slowly to  hgb 8  Empiric PPI short term  Will need Colon and EGD - would prefer to wait close to 5 days off plavix so procedures can be diagnostic and therapeutic We will follow .    Amy Esterwood  12/07/2014, 10:14 AM

## 2014-12-07 NOTE — ED Provider Notes (Signed)
  Medical screening examination/treatment/procedure(s) were conducted as a shared visit with non-physician practitioner(s) and myself.  I personally evaluated the patient during the encounter.   EKG Interpretation None     2:30 AM HPI Comments: Jesse Burton is a 81 y.o. male brought in by ambulance, who presents to the Emergency Department complaining of low hemoglobin resulting from recent labs per staff at [facility]. Family member reports the pt has been looking pale for the past 2 weeks and was recently ill with a GI illness which has resolved. Pt states he feels 'fine' and denies any pain.   PE: RRR Lungs clear Normal bowel sounds PERL; Defect in left pupil  Moist mucous membranes DTRs normal Skin is pale but warm and dry; no edema Compartments soft   Plan admit for transfusion   Lyza Houseworth, MD 12/07/14 [phone]

## 2014-12-07 NOTE — ED Notes (Signed)
Critical hemoglobin 4.9 noted from main lab. RN and EDP notified.

## 2014-12-07 NOTE — Progress Notes (Signed)
Pt seen and examined, admitted this am per Dr.Niu 89/M with h/o CABG, HTN, Hypothyroidism admitted with Chronic/Subacute Blood loss anemia  Iron deficiency anemia/Heme positive stools -held plavix, 2 units of PRBC ordered, second unit transfusing -Monitor hemoglobin, IV PPI Q12 -Remote history of colonoscopy does not recall any abnormalities, daughter thinks this was more than 10 years ago -CT abdomen ordered by admitting MD, Will FU -GI consulted, will need EGD and colonoscopy most likely  CAD: s/p of CABG. -stable, hold plavix   Arthritis: -Tylenol and tramadol  Retinitis: - On cellcept - continue home eye drops   Atrial Fibrillation: CHA2DS2-VASc Score is 3, ideally needs oral anticoagulation, not Anticoagulation at home, possibly due to old age and high risk of fall. -Heart rate is well controlled, continue digoxin, leve is 0.5  HLD:  -Continue Lipitor  Hypothyroidism:  -Continue Synthroid  Leg swelling -takes PO lasix, got IV lasix between transfusions  BPH: stable - Continue Flomax  DVT ppx: SCDs  Code Status: Full code Family Communication: daughter at bed side Disposition Plan: Home pwnding workup

## 2014-12-08 DIAGNOSIS — Z5181 Encounter for therapeutic drug level monitoring: Secondary | ICD-10-CM

## 2014-12-08 DIAGNOSIS — D509 Iron deficiency anemia, unspecified: Secondary | ICD-10-CM

## 2014-12-08 DIAGNOSIS — R195 Other fecal abnormalities: Secondary | ICD-10-CM | POA: Insufficient documentation

## 2014-12-08 DIAGNOSIS — Z7902 Long term (current) use of antithrombotics/antiplatelets: Secondary | ICD-10-CM

## 2014-12-08 LAB — TYPE AND SCREEN
ABO/RH(D): A POS
ANTIBODY SCREEN: NEGATIVE
Unit division: 0
Unit division: 0
Unit division: 0

## 2014-12-08 LAB — GLUCOSE, CAPILLARY: GLUCOSE-CAPILLARY: 99 mg/dL (ref 65–99)

## 2014-12-08 LAB — URINALYSIS, ROUTINE W REFLEX MICROSCOPIC
Bilirubin Urine: NEGATIVE
Glucose, UA: NEGATIVE mg/dL
Hgb urine dipstick: NEGATIVE
Ketones, ur: NEGATIVE mg/dL
Leukocytes, UA: NEGATIVE
Nitrite: NEGATIVE
Protein, ur: NEGATIVE mg/dL
Specific Gravity, Urine: 1.011 (ref 1.005–1.030)
Urobilinogen, UA: 0.2 mg/dL (ref 0.0–1.0)
pH: 6 (ref 5.0–8.0)

## 2014-12-08 LAB — BASIC METABOLIC PANEL
Anion gap: 5 (ref 5–15)
BUN: 21 mg/dL — ABNORMAL HIGH (ref 6–20)
CHLORIDE: 108 mmol/L (ref 101–111)
CO2: 25 mmol/L (ref 22–32)
CREATININE: 0.96 mg/dL (ref 0.61–1.24)
Calcium: 8.6 mg/dL — ABNORMAL LOW (ref 8.9–10.3)
Glucose, Bld: 100 mg/dL — ABNORMAL HIGH (ref 65–99)
POTASSIUM: 3.6 mmol/L (ref 3.5–5.1)
SODIUM: 138 mmol/L (ref 135–145)

## 2014-12-08 LAB — CBC
HCT: 23.6 % — ABNORMAL LOW (ref 39.0–52.0)
HEMATOCRIT: 25.8 % — AB (ref 39.0–52.0)
Hemoglobin: 7.4 g/dL — ABNORMAL LOW (ref 13.0–17.0)
Hemoglobin: 8.1 g/dL — ABNORMAL LOW (ref 13.0–17.0)
MCH: 24.3 pg — AB (ref 26.0–34.0)
MCH: 24.5 pg — ABNORMAL LOW (ref 26.0–34.0)
MCHC: 31.4 g/dL (ref 30.0–36.0)
MCHC: 31.4 g/dL (ref 30.0–36.0)
MCV: 77.6 fL — AB (ref 78.0–100.0)
MCV: 77.9 fL — ABNORMAL LOW (ref 78.0–100.0)
PLATELETS: 236 10*3/uL (ref 150–400)
Platelets: 257 10*3/uL (ref 150–400)
RBC: 3.04 MIL/uL — ABNORMAL LOW (ref 4.22–5.81)
RBC: 3.31 MIL/uL — ABNORMAL LOW (ref 4.22–5.81)
RDW: 16.3 % — ABNORMAL HIGH (ref 11.5–15.5)
RDW: 16.2 % — AB (ref 11.5–15.5)
WBC: 5.4 10*3/uL (ref 4.0–10.5)
WBC: 7.7 10*3/uL (ref 4.0–10.5)

## 2014-12-08 MED ORDER — METOPROLOL TARTRATE 25 MG PO TABS
25.0000 mg | ORAL_TABLET | Freq: Two times a day (BID) | ORAL | Status: DC
  Administered 2014-12-08 – 2014-12-15 (×12): 25 mg via ORAL
  Filled 2014-12-08 (×13): qty 1

## 2014-12-08 MED ORDER — FUROSEMIDE 10 MG/ML IJ SOLN
20.0000 mg | Freq: Two times a day (BID) | INTRAMUSCULAR | Status: DC
  Administered 2014-12-08 – 2014-12-10 (×5): 20 mg via INTRAVENOUS
  Filled 2014-12-08 (×5): qty 2

## 2014-12-08 NOTE — Clinical Documentation Improvement (Signed)
Internal Medicine  Can the diagnosis of CHF be further specified?    Acuity - Acute, Chronic, Acute on Chronic   Type - Systolic, Diastolic, Systolic and Diastolic  Other  Clinically Undetermined  Please exercise your independent, professional judgment when responding. A specific answer is not anticipated or expected.   Thank You,  Sumeya Yontz J Bardia Wangerin, RN, BSN Health Information Management [place] [phone] 

## 2014-12-08 NOTE — Progress Notes (Signed)
OT Cancellation Note  Patient Details Name: Daryll Spisak MRN: [id number] DOB: 1925/04/17   Cancelled Treatment:    Reason Eval/Treat Not Completed: Fatigue/lethargy limiting ability to participate - Pt sleeping soundly, and unable to adequately arouse.  Daughter reports pt was agitated last pm, and received Ativan.  Will reattempt.   Darlina Rumpf [place], OTR/L [phone]  12/08/2014, 8:17 AM

## 2014-12-08 NOTE — Progress Notes (Signed)
[facility] PROGRESS NOTE  Jesse Burton KVQ:[id number] DOB: 1926-02-08 DOA: 12/07/2014 PCP: No primary care provider on file.  Assessment/Plan: Iron deficiency anemia/Heme positive stools -held plavix, s/p 3 units PRBC -Monitor hemoglobin, IV PPI Q12 -Remote history of colonoscopy does not recall any abnormalities, daughter thinks this was more than 10 years ago -CT abdomen with edema/CHF -lasix ordered -GI following, plan for EGD and colonoscopy on Monday  Dementia with sundowning -agitated overnight and got Ativan x1 dose -avoid Benzos for delirium, Low dose Haldol PRN if needed, re-orientation techniques  CAD: s/p of CABG. -stable, held plavix   Arthritis: -Tylenol and tramadol  Retinitis: - On cellcept - continue home eye drops   Atrial Fibrillation: CHA2DS2-VASc Score is 3, ideally needs oral anticoagulation, not Anticoagulation at home, possibly due to old age and high risk of fall. -Heart rate is well controlled, continue digoxin, leve is 0.5  HLD:  -Continue Lipitor  Hypothyroidism:  -Continue Synthroid  Edema /Few basilar crackles -IV lasix x2 doses, get lasix between blood transfusions -check ECHO  BPH: stable - Continue Flomax  DVT ppx: SCDs  Code Status: Full code Family Communication: daughter at bed side Disposition Plan: Home vs SNF             HPI/Subjective: Agitated overnight, worsened with Ativan  Objective: Filed Vitals:   12/08/14 0905  BP:   Pulse: 98  Temp:   Resp:     Intake/Output Summary (Last 24 hours) at 12/08/14 1205 Last data filed at 12/08/14 0855  Gross per 24 hour  Intake    210 ml  Output   1300 ml  Net  -1090 ml   Filed Weights   12/07/14 0356 12/08/14 0709  Weight: 90.5 kg (199 lb 8.3 oz) 88.1 kg (194 lb 3.6 oz)    Exam:   General:  Confused, alert, talkative  Cardiovascular: S1S2/RRR  Respiratory: CTAB  Abdomen: soft, NT, BS present  Musculoskeletal: no edema c/c   Data  Reviewed: Basic Metabolic Panel:  Recent Labs Lab 12/07/14 0144 12/07/14 0423 12/08/14 0042  NA 140 142 138  K 4.0 3.9 3.6  CL 110 111 108  CO2 25 25 25   GLUCOSE 111* 104* 100*  BUN 32* 31* 21*  CREATININE 1.12 1.04 0.96  CALCIUM 8.6* 8.9 8.6*   Liver Function Tests:  Recent Labs Lab 12/07/14 0144  AST 21  ALT 20  ALKPHOS 79  BILITOT 0.9  PROT 5.8*  ALBUMIN 3.6   No results for input(s): LIPASE, AMYLASE in the last 168 hours. No results for input(s): AMMONIA in the last 168 hours. CBC:  Recent Labs Lab 12/07/14 0144 12/07/14 0423 12/07/14 1400 12/07/14 2300 12/08/14 0300  WBC 4.3 4.9 5.2 5.4 7.7  NEUTROABS 2.7  --   --   --   --   HGB 4.9* 4.9* 6.7* 7.4* 8.1*  HCT 16.5* 17.1* 21.9* 23.6* 25.8*  MCV 77.5* 78.1 77.9* 77.6* 77.9*  PLT 273 277 262 236 257   Cardiac Enzymes: No results for input(s): CKTOTAL, CKMB, CKMBINDEX, TROPONINI in the last 168 hours. BNP (last 3 results)  Recent Labs  12/07/14 0423  BNP 630.8*    ProBNP (last 3 results) No results for input(s): PROBNP in the last 8760 hours.  CBG:  Recent Labs Lab 12/08/14 0810  GLUCAP 99    Recent Results (from the past 240 hour(s))  MRSA PCR Screening     Status: None   Collection Time: 12/07/14  4:34 AM  Result  Value Ref Range Status   MRSA by PCR NEGATIVE NEGATIVE Final    Comment:        The GeneXpert MRSA Assay (FDA approved for NASAL specimens only), is one component of a comprehensive MRSA colonization surveillance program. It is not intended to diagnose MRSA infection nor to guide or monitor treatment for MRSA infections.      Studies: Ct Abdomen Pelvis W Contrast  12/07/2014  CLINICAL DATA:  Dark stools and abdominal distention. EXAM: CT ABDOMEN AND PELVIS WITH CONTRAST TECHNIQUE: Multidetector CT imaging of the abdomen and pelvis was performed using the standard protocol following bolus administration of intravenous contrast. CONTRAST:  176mL OMNIPAQUE IOHEXOL 300  MG/ML  SOLN COMPARISON:  None. FINDINGS: Normal hepatic contour. The examination is minimally degraded secondary exclusion of the dome of the diaphragm. No discrete hepatic lesions. There are multiple layering laminated gallstones seen throughout the gallbladder. This finding is associated with a potential very minimal amount of gallbladder wall thickening and pericholecystic fluid though this may be accentuated due to a minimal amount of third spacing within the abdomen. No definite intra or extrahepatic bili duct dilatation. No ascites. There is symmetric enhancement and excretion of the bilateral kidneys. No definite renal stones in this postcontrast examination. No discrete renal lesions. There is a minimal amount of bilateral perinephric stranding, likely age and body habitus related. No urinary obstruction. Normal appearance of the bilateral adrenal glands, pancreas and spleen. Ingested enteric contrast extends to the level of the distal transverse colon. Moderate colonic stool burden without evidence of enteric obstruction. The bowel is otherwise normal in course and caliber without wall thickening. Normal appearance of the terminal ileum. There is is a minimal amount of stranding throughout the root of the abdominal mesenteric including the right lower abdominal quadrant surrounding the appendix without focality and favored to be secondary to third spacing/volume overload. Otherwise, normal appearance of the appendix. Moderate amount of mixed calcified and noncalcified atherosclerotic plaque within a normal caliber abdominal aorta. The major branch vessels of the abdominal aorta appear patent on this non CTA examination. 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 No bulky retroperitoneal, mesenteric, pelvic or inguinal lymphadenopathy. The prostate is borderline enlarged with mass effect on the undersurface urinary bladder. Otherwise normal  appearance of the urinary bladder given degree distention. There is a small amount of fluid seen within the pelvic cul-de-sac. Limited visualization of the lower thorax is degraded secondary to patient respiratory artifact. Trace bilateral pleural effusions, right greater than left, with associated dependent subpleural opacities, likely atelectasis. Cardiomegaly. Coronary artery calcifications. No pericardial effusion. No acute or aggressive osseous abnormalities. Mild-to-moderate multilevel lumbar spine DDD, worse at L2-L3 with disc space height loss, endplate irregularity and sclerosis. Mild diffuse body wall anasarca. Regional soft tissues appear otherwise normal. IMPRESSION: 1. Cardiomegaly with findings worrisome for pulmonary edema including trace bilateral effusions, right greater than left, mild diffuse body wall anasarca and minimal amount of mesenteric stranding / third-spacing throughout the peritoneum. Clinical correlation is advised. 2. Cholelithiasis with potential minimal amount of gallbladder wall thickening and pericholecystic fluid, again, potentially attributable to suspected third-spacing within the peritoneal cavity - if clinical concern persists for acute cholecystitis, further evaluation with gallbladder ultrasound and/or nuclear medicine HIDA scan could be performed as clinically indicated. 3. Coronary artery calcifications. 4. Prostatomegaly. Electronically Signed   By: Sandi Mariscal M.D.   On: 12/07/2014 11:43    Scheduled Meds: . atorvastatin  40 mg Oral q1800  . digoxin  0.125 mg Oral Daily  . erythromycin  1 application Right Eye QHS  . furosemide  20 mg Intravenous BID  . l-methylfolate-B6-B12  1 tablet Oral Daily  . levothyroxine  88 mcg Oral QAC breakfast  . losartan  50 mg Oral Daily  . multivitamin with minerals  1 tablet Oral Daily  . mycophenolate  500 mg Oral Daily  . pantoprazole (PROTONIX) IV  40 mg Intravenous Q12H  . sodium chloride  3 mL Intravenous Q12H  .  tamsulosin  0.8 mg Oral Daily  . traMADol  50 mg Oral BID   Continuous Infusions:  Antibiotics Given (last 72 hours)    None      Principal Problem:   GIB (gastrointestinal bleeding) Active Problems:   Guaiac positive stools   Microcytic anemia   Arthritis   Hypertension   Hypothyroidism   Retinitis   Abdominal distention   Essential hypertension   A-fib (HCC)   Iron deficiency anemia due to chronic blood loss   Melena    Time spent: 50min    Jesse Burton  [facility] Pager [phone]. If 7PM-7AM, please contact night-coverage at [email or web address], password [facility] 12/08/2014, 12:05 PM  LOS: 1 day

## 2014-12-08 NOTE — Progress Notes (Signed)
PT Cancellation Note  Patient Details Name: Jesse Burton MRN: [id number] DOB: 10-20-1925   Cancelled Treatment:    Reason Eval/Treat Not Completed: Fatigue/lethargy limiting ability to participate--family requests PT check back later today. Will check back as schedule allows.    Weston Anna, MPT Pager: [phone]

## 2014-12-08 NOTE — Progress Notes (Signed)
Patient ID: Jesse Burton, male   DOB: [birth year], 81 y.o.   MRN: [id number]    Progress Note   Subjective   Pt sleeping, daughter at bedside- says he was agitated last night- up most of night- had ativan which made it worse. Says he does have issues with sundowning   Objective   Vital signs in last 24 hours: Temp:  [98.1 F (36.7 C)-98.6 F (37 C)] 98.1 F (36.7 C) (10/19 2040) Pulse Rate:  [66-98] 98 (10/20 0905) Resp:  [18-20] 18 (10/19 2040) BP: (118-151)/(50-91) 151/79 mmHg (10/19 2040) SpO2:  [95 %-100 %] 99 % (10/19 2040) Weight:  [194 lb 3.6 oz (88.1 kg)] 194 lb 3.6 oz (88.1 kg) (10/20 0709) Last BM Date: 12/06/14 General: elderly WM in NAD Heart:  Regular rate and rhythm; no murmurs Lungs: Respirations even and unlabored, lungs CTA bilaterally Abdomen:  Soft, nontender and nondistended. Normal bowel sounds. Extremities:  Without edema.  Psych:  sleeping  Intake/Output from previous day: 10/19 0701 - 10/20 0700 In: 814 [P.O.:120; Blood:694] Out: 1400 [Urine:1400] Intake/Output this shift: Total I/O In: 60 [P.O.:60] Out: 200 [Urine:200]  Lab Results:  Recent Labs  12/07/14 1400 12/07/14 2300 12/08/14 0300  WBC 5.2 5.4 7.7  HGB 6.7* 7.4* 8.1*  HCT 21.9* 23.6* 25.8*  PLT 262 236 257   BMET  Recent Labs  12/07/14 0144 12/07/14 0423 12/08/14 0042  NA 140 142 138  K 4.0 3.9 3.6  CL 110 111 108  CO2 25 25 25   GLUCOSE 111* 104* 100*  BUN 32* 31* 21*  CREATININE 1.12 1.04 0.96  CALCIUM 8.6* 8.9 8.6*   LFT  Recent Labs  12/07/14 0144  PROT 5.8*  ALBUMIN 3.6  AST 21  ALT 20  ALKPHOS 79  BILITOT 0.9   PT/INR  Recent Labs  12/07/14 0144  LABPROT 16.0*  INR 1.26    Studies/Results: Ct Abdomen Pelvis W Contrast  12/07/2014  CLINICAL DATA:  Dark stools and abdominal distention. EXAM: CT ABDOMEN AND PELVIS WITH CONTRAST TECHNIQUE: Multidetector CT imaging of the abdomen and pelvis was performed using the standard protocol following  bolus administration of intravenous contrast. CONTRAST:  177mL OMNIPAQUE IOHEXOL 300 MG/ML  SOLN COMPARISON:  None. FINDINGS: Normal hepatic contour. The examination is minimally degraded secondary exclusion of the dome of the diaphragm. No discrete hepatic lesions. There are multiple layering laminated gallstones seen throughout the gallbladder. This finding is associated with a potential very minimal amount of gallbladder wall thickening and pericholecystic fluid though this may be accentuated due to a minimal amount of third spacing within the abdomen. No definite intra or extrahepatic bili duct dilatation. No ascites. There is symmetric enhancement and excretion of the bilateral kidneys. No definite renal stones in this postcontrast examination. No discrete renal lesions. There is a minimal amount of bilateral perinephric stranding, likely age and body habitus related. No urinary obstruction. Normal appearance of the bilateral adrenal glands, pancreas and spleen. Ingested enteric contrast extends to the level of the distal transverse colon. Moderate colonic stool burden without evidence of enteric obstruction. The bowel is otherwise normal in course and caliber without wall thickening. Normal appearance of the terminal ileum. There is is a minimal amount of stranding throughout the root of the abdominal mesenteric including the right lower abdominal quadrant surrounding the appendix without focality and favored to be secondary to third spacing/volume overload. Otherwise, normal appearance of the appendix. Moderate amount of mixed calcified and noncalcified atherosclerotic plaque within a normal  caliber abdominal aorta. The major branch vessels of the abdominal aorta appear patent on this non CTA examination. 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 No bulky retroperitoneal, mesenteric, pelvic or inguinal lymphadenopathy. The prostate is borderline  enlarged with mass effect on the undersurface urinary bladder. Otherwise normal appearance of the urinary bladder given degree distention. There is a small amount of fluid seen within the pelvic cul-de-sac. Limited visualization of the lower thorax is degraded secondary to patient respiratory artifact. Trace bilateral pleural effusions, right greater than left, with associated dependent subpleural opacities, likely atelectasis. Cardiomegaly. Coronary artery calcifications. No pericardial effusion. No acute or aggressive osseous abnormalities. Mild-to-moderate multilevel lumbar spine DDD, worse at L2-L3 with disc space height loss, endplate irregularity and sclerosis. Mild diffuse body wall anasarca. Regional soft tissues appear otherwise normal. IMPRESSION: 1. Cardiomegaly with findings worrisome for pulmonary edema including trace bilateral effusions, right greater than left, mild diffuse body wall anasarca and minimal amount of mesenteric stranding / third-spacing throughout the peritoneum. Clinical correlation is advised. 2. Cholelithiasis with potential minimal amount of gallbladder wall thickening and pericholecystic fluid, again, potentially attributable to suspected third-spacing within the peritoneal cavity - if clinical concern persists for acute cholecystitis, further evaluation with gallbladder ultrasound and/or nuclear medicine HIDA scan could be performed as clinically indicated. 3. Coronary artery calcifications. 4. Prostatomegaly. Electronically Signed   By: Sandi Mariscal M.D.   On: 12/07/2014 11:43       Assessment / Plan:    #1 81 yo male with profound iron deficiency anemia/heme+ stool and reports of recent dark stool  Stable post transfusions Plavix on hold #2 Dementia #3 CAD s/p CABG #4 HTN #5 HX afib  #6 cholelithiasis #7 CHF  Plan; Discussed with daughter- plan for colon /EGD this admission , need pt off Plavix for 5 days prior to procedures- so will likely be Monday Transfuse  to keep hgb 8-9 range   Principal Problem:   GIB (gastrointestinal bleeding) Active Problems:   Guaiac positive stools   Microcytic anemia   Arthritis   Hypertension   Hypothyroidism   Retinitis   Abdominal distention   Essential hypertension   A-fib (HCC)   Iron deficiency anemia due to chronic blood loss   Melena     LOS: 1 day   Amy Esterwood  12/08/2014, 9:22 AM  GI ATTENDING  Interval history and data reviewed. Patient personally seen and examined. Agree with interval progress note as outlined above. The patient is clinically stable. However, quite confused secondary to dementia. Oriented 1. Ideally colonoscopy and upper endoscopy would be performed to evaluate profound iron deficiency anemia and Hemoccult positive stool in this elderly gentleman. As well, after Plavix washout (first of next week). Hopefully his mental status will improve in order to allow adequate bowel preparation. Otherwise, extensive evaluation may be somewhat limited. In the interim supportive care and monitor blood counts. Transfuse as needed for hemoglobin less than 8. Will follow.  Docia Chuck. Geri Seminole., M.D. [facility] Division of Gastroenterology

## 2014-12-08 NOTE — Progress Notes (Signed)
Patient's heart rate up between 120-140 when ambulating with physical therapist, back in the chair at rest heart rate between 98-120, rhythm converted from SR with PAC to  A-fib. Patient denies any distress, vitals 135/104, 20, 98, 100%-RA. Dr. Broadus John notified, ordered Metoprolol 25mg . will continue to assess patient.

## 2014-12-08 NOTE — Evaluation (Signed)
Physical Therapy Evaluation Patient Details Name: Aaro Meyers MRN: [id number] DOB: 12/03/1925 Today's Date: 12/08/2014   History of Present Illness  81 yo male admitted with GI bleed, anemia. Hx of dementia, Afib, HTN. Pt is from [facility]-long term care.  Clinical Impression  On eval, pt required Mod-Max assist for mobility-walked ~15'x2 with RW (to and from bathroom). Pt is unsteady and at risk for falls. HR increased to 120s-140s during session per RN. Multimodal cues for safety during session. Recommend return to SNF.     Follow Up Recommendations SNF    Equipment Recommendations  None recommended by PT    Recommendations for Other Services       Precautions / Restrictions Precautions Precautions: Fall Restrictions Weight Bearing Restrictions: No      Mobility  Bed Mobility Overal bed mobility: Needs Assistance Bed Mobility: Supine to Sit     Supine to sit: Min assist     General bed mobility comments: Assist for trunk and LEs. Increased time. VCs/tactile safety, technique.   Transfers Overall transfer level: Needs assistance Equipment used: Rolling walker (2 wheeled) Transfers: Sit to/from Stand Sit to Stand: Max assist;From elevated surface         General transfer comment: Assist to rise, stabilize, control descent. Max assist from low surface (toilet), Mod assist from elevated bed. Multimodal cues for safety, hand placement, technique. Pt attempts to sit before safely positioned.  Ambulation/Gait Ambulation/Gait assistance: Mod assist Ambulation Distance (Feet): 15 Feet (x2) Assistive device: Rolling walker (2 wheeled) Gait Pattern/deviations: Trunk flexed;Decreased stride length;Shuffle     General Gait Details: Assist to stabilize pt and maneuver safely with RW. Pt tends to maintain flexed posture and keep walker too far away. Very unsteady.   Stairs            Wheelchair Mobility    Modified Rankin (Stroke Patients Only)        Balance Overall balance assessment: Needs assistance         Standing balance support: Bilateral upper extremity supported;During functional activity Standing balance-Leahy Scale: Poor                               Pertinent Vitals/Pain Pain Assessment: No/denies pain    Home Living Family/patient expects to be discharged to:: Skilled nursing facility                      Prior Function Level of Independence: Needs assistance   Gait / Transfers Assistance Needed: uses RW-walks to dining room  ADL's / Homemaking Assistance Needed: requires assistance for ADLs        Hand Dominance        Extremity/Trunk Assessment   Upper Extremity Assessment: Generalized weakness           Lower Extremity Assessment: Generalized weakness      Cervical / Trunk Assessment: Kyphotic  Communication   Communication: No difficulties  Cognition Arousal/Alertness: Awake/alert Behavior During Therapy: WFL for tasks assessed/performed Overall Cognitive Status: History of cognitive impairments - at baseline                      General Comments      Exercises        Assessment/Plan    PT Assessment Patient needs continued PT services  PT Diagnosis Difficulty walking;Abnormality of gait;Generalized weakness;Altered mental status   PT Problem List Decreased strength;Decreased range of  motion;Decreased activity tolerance;Decreased balance;Decreased mobility;Decreased knowledge of use of DME;Decreased cognition  PT Treatment Interventions DME instruction;Gait training;Functional mobility training;Therapeutic activities;Patient/family education;Balance training;Therapeutic exercise   PT Goals (Current goals can be found in the Care Plan section) Acute Rehab PT Goals Patient Stated Goal: none stated PT Goal Formulation: Patient unable to participate in goal setting Time For Goal Achievement: 12/22/14 Potential to Achieve Goals: Fair    Frequency  Min 3X/week   Barriers to discharge        Co-evaluation               End of Session Equipment Utilized During Treatment: Gait belt Activity Tolerance: Patient tolerated treatment well Patient left: in chair;with call bell/phone within reach;with chair alarm set           Time: 1173-5670 PT Time Calculation (min) (ACUTE ONLY): 22 min   Charges:   PT Evaluation $Initial PT Evaluation Tier I: 1 Procedure     PT G Codes:        Weston Anna, MPT Pager: [phone]

## 2014-12-08 NOTE — Progress Notes (Signed)
Updated [facility] of plans for EGD before dc back. Patient more confused today- CSW spoke with daughter, Raquel Sarna, to touch base and nd she agrees with plans for return once medically ready-    Eduard Clos, MSW, [place]

## 2014-12-09 ENCOUNTER — Inpatient Hospital Stay (HOSPITAL_COMMUNITY): Payer: Medicare Other

## 2014-12-09 DIAGNOSIS — R0600 Dyspnea, unspecified: Secondary | ICD-10-CM

## 2014-12-09 LAB — BASIC METABOLIC PANEL
Anion gap: 9 (ref 5–15)
BUN: 18 mg/dL (ref 6–20)
CHLORIDE: 105 mmol/L (ref 101–111)
CO2: 25 mmol/L (ref 22–32)
CREATININE: 1.13 mg/dL (ref 0.61–1.24)
Calcium: 8.5 mg/dL — ABNORMAL LOW (ref 8.9–10.3)
GFR calc Af Amer: >60 mL/min (ref >60)
GFR calc non Af Amer: 56 mL/min — ABNORMAL LOW (ref >60)
GLUCOSE: 94 mg/dL (ref 65–99)
Potassium: 3.4 mmol/L — ABNORMAL LOW (ref 3.5–5.1)
Sodium: 139 mmol/L (ref 135–145)

## 2014-12-09 LAB — CBC
HCT: 24.2 % — ABNORMAL LOW (ref 39.0–52.0)
Hemoglobin: 7.6 g/dL — ABNORMAL LOW (ref 13.0–17.0)
MCH: 24.6 pg — AB (ref 26.0–34.0)
MCHC: 31.4 g/dL (ref 30.0–36.0)
MCV: 78.3 fL (ref 78.0–100.0)
PLATELETS: 256 10*3/uL (ref 150–400)
RBC: 3.09 MIL/uL — AB (ref 4.22–5.81)
RDW: 16.9 % — AB (ref 11.5–15.5)
WBC: 5.8 10*3/uL (ref 4.0–10.5)

## 2014-12-09 LAB — GLUCOSE, CAPILLARY: Glucose-Capillary: 116 mg/dL — ABNORMAL HIGH (ref 65–99)

## 2014-12-09 MED ORDER — POTASSIUM CHLORIDE CRYS ER 20 MEQ PO TBCR
40.0000 meq | EXTENDED_RELEASE_TABLET | Freq: Two times a day (BID) | ORAL | Status: DC
  Administered 2014-12-09 – 2014-12-12 (×7): 40 meq via ORAL
  Filled 2014-12-09 (×7): qty 2

## 2014-12-09 NOTE — Progress Notes (Signed)
[facility] PROGRESS NOTE  Jesse Burton YQI:[id number] DOB: March 01, 1925 DOA: 12/07/2014 PCP: No primary care provider on file.  Assessment/Plan: Iron deficiency anemia/Heme positive stools -held plavix, s/p 3 units PRBC -Hb 7.6, continue IV PPI Q12 -Remote history of colonoscopy does not recall any abnormalities, daughter thinks this was more than 10 years ago -CT abdomen with edema/CHF -lasix ordered -GI following, plan for EGD and colonoscopy on Monday  Dementia with sundowning -agitated overnight 10/19-20 and got Ativan x1 dose -avoid Benzos for delirium, Low dose Haldol PRN if needed, re-orientation techniques -better today  CHF/? Diastolic -continue IV lasix today -check ECHO  CAD: s/p of CABG. -stable, held plavix   Arthritis: -Tylenol and tramadol  Retinitis: - On cellcept - continue home eye drops   Atrial Fibrillation: CHA2DS2-VASc Score is 3, ideally needs oral anticoagulation, not Anticoagulation at home, possibly due to old age and high risk of fall.  -HR improved now, was uncontrolled 10/20 afternoon and started on Metoprolol - continue digoxin, level is 0.5  HLD:  -Continue Lipitor  Hypothyroidism:  -Continue Synthroid  BPH: stable - Continue Flomax  DVT ppx: SCDs  Code Status: Full code Family Communication: daughter at bed side Disposition Plan: Home vs SNF        HPI/Subjective: No events overnight, less agitated  Objective: Filed Vitals:   12/09/14 0637  BP: 127/63  Pulse: 65  Temp: 98.2 F (36.8 C)  Resp: 20    Intake/Output Summary (Last 24 hours) at 12/09/14 1258 Last data filed at 12/09/14 1000  Gross per 24 hour  Intake    720 ml  Output    600 ml  Net    120 ml   Filed Weights   12/07/14 0356 12/08/14 0709  Weight: 90.5 kg (199 lb 8.3 oz) 88.1 kg (194 lb 3.6 oz)    Exam:   General:  Confused, alert, talkative  Cardiovascular: S1S2/RRR  Respiratory: CTAB  Abdomen: soft, NT, BS  present  Musculoskeletal: no edema c/c   Data Reviewed: Basic Metabolic Panel:  Recent Labs Lab 12/07/14 0144 12/07/14 0423 12/08/14 0042 12/09/14 0535  NA 140 142 138 139  K 4.0 3.9 3.6 3.4*  CL 110 111 108 105  CO2 25 25 25 25   GLUCOSE 111* 104* 100* 94  BUN 32* 31* 21* 18  CREATININE 1.12 1.04 0.96 1.13  CALCIUM 8.6* 8.9 8.6* 8.5*   Liver Function Tests:  Recent Labs Lab 12/07/14 0144  AST 21  ALT 20  ALKPHOS 79  BILITOT 0.9  PROT 5.8*  ALBUMIN 3.6   No results for input(s): LIPASE, AMYLASE in the last 168 hours. No results for input(s): AMMONIA in the last 168 hours. CBC:  Recent Labs Lab 12/07/14 0144 12/07/14 0423 12/07/14 1400 12/07/14 2300 12/08/14 0300 12/09/14 0535  WBC 4.3 4.9 5.2 5.4 7.7 5.8  NEUTROABS 2.7  --   --   --   --   --   HGB 4.9* 4.9* 6.7* 7.4* 8.1* 7.6*  HCT 16.5* 17.1* 21.9* 23.6* 25.8* 24.2*  MCV 77.5* 78.1 77.9* 77.6* 77.9* 78.3  PLT 273 277 262 236 257 256   Cardiac Enzymes: No results for input(s): CKTOTAL, CKMB, CKMBINDEX, TROPONINI in the last 168 hours. BNP (last 3 results)  Recent Labs  12/07/14 0423  BNP 630.8*    ProBNP (last 3 results) No results for input(s): PROBNP in the last 8760 hours.  CBG:  Recent Labs Lab 12/08/14 0810 12/09/14 0827  GLUCAP 99 116*  Recent Results (from the past 240 hour(s))  MRSA PCR Screening     Status: None   Collection Time: 12/07/14  4:34 AM  Result Value Ref Range Status   MRSA by PCR NEGATIVE NEGATIVE Final    Comment:        The GeneXpert MRSA Assay (FDA approved for NASAL specimens only), is one component of a comprehensive MRSA colonization surveillance program. It is not intended to diagnose MRSA infection nor to guide or monitor treatment for MRSA infections.      Studies: No results found.  Scheduled Meds: . atorvastatin  40 mg Oral q1800  . digoxin  0.125 mg Oral Daily  . erythromycin  1 application Right Eye QHS  . furosemide  20 mg  Intravenous BID  . l-methylfolate-B6-B12  1 tablet Oral Daily  . levothyroxine  88 mcg Oral QAC breakfast  . losartan  50 mg Oral Daily  . metoprolol tartrate  25 mg Oral BID  . multivitamin with minerals  1 tablet Oral Daily  . mycophenolate  500 mg Oral Daily  . pantoprazole (PROTONIX) IV  40 mg Intravenous Q12H  . sodium chloride  3 mL Intravenous Q12H  . tamsulosin  0.8 mg Oral Daily  . traMADol  50 mg Oral BID   Continuous Infusions:  Antibiotics Given (last 72 hours)    None      Principal Problem:   GIB (gastrointestinal bleeding) Active Problems:   Guaiac positive stools   Microcytic anemia   Arthritis   Hypertension   Hypothyroidism   Retinitis   Abdominal distention   Essential hypertension   A-fib (HCC)   Iron deficiency anemia due to chronic blood loss   Melena   Anemia, iron deficiency   Heme positive stool   Encounter for monitoring antiplatelet therapy    Time spent: 50min    Jesse Burton  [facility] Pager [phone]. If 7PM-7AM, please contact night-coverage at [email or web address], password [facility] 12/09/2014, 12:58 PM  LOS: 2 days

## 2014-12-09 NOTE — Progress Notes (Signed)
CCMD called and reported that patient converted from A-fib back to NRS. Rate sustaining in the 70's.  MD made aware.

## 2014-12-09 NOTE — Progress Notes (Signed)
Echocardiogram 2D Echocardiogram has been performed.  Jesse Burton 12/09/2014, 3:17 PM

## 2014-12-09 NOTE — Evaluation (Signed)
Occupational Therapy Evaluation Patient Details Name: Jesse Burton MRN: [id number] DOB: 1925-07-29 Today's Date: 12/09/2014    History of Present Illness 81 yo male admitted with GI bleed, anemia. Hx of dementia, Afib, HTN. Pt is from [facility]-long term care.   Clinical Impression   Pt admitted with above. He demonstrates the below listed deficits and will benefit from continued OT to maximize safety and independence with BADLs.  Pt requires mod - max A for ADLs and min A for functional mobility.   Recommend SNF level rehab at discharge.       Follow Up Recommendations  SNF    Equipment Recommendations  None recommended by OT    Recommendations for Other Services       Precautions / Restrictions Precautions Precautions: Fall Restrictions Weight Bearing Restrictions: No      Mobility Bed Mobility                  Transfers Overall transfer level: Needs assistance Equipment used: Rolling walker (2 wheeled) Transfers: Sit to/from Stand;Stand Pivot Transfers Sit to Stand: Min assist Stand pivot transfers: Min assist       General transfer comment: minA for balance     Balance Overall balance assessment: Needs assistance Sitting-balance support: Feet supported Sitting balance-Leahy Scale: Good     Standing balance support: Bilateral upper extremity supported Standing balance-Leahy Scale: Poor                              ADL Overall ADL's : Needs assistance/impaired Eating/Feeding: Minimal assistance;Sitting Eating/Feeding Details (indicate cue type and reason): Pt moves slowly - requires increased time.   assist with set up and min A due to spillage  Grooming: Wash/dry hands;Wash/dry face;Brushing hair;Minimal assistance;Sitting   Upper Body Bathing: Moderate assistance;Sitting   Lower Body Bathing: Moderate assistance;Sit to/from stand   Upper Body Dressing : Maximal assistance;Sitting   Lower Body Dressing: Maximal  assistance;Sit to/from stand   Toilet Transfer: Minimal assistance;Ambulation;BSC;RW   Toileting- Clothing Manipulation and Hygiene: Total assistance;Sit to/from stand       Functional mobility during ADLs: Minimal assistance;Rolling walker General ADL Comments: Pt moves slowly, requires frequent redirection to task due to cognitive deficits     Vision     Perception     Praxis      Pertinent Vitals/Pain Pain Assessment: No/denies pain     Hand Dominance     Extremity/Trunk Assessment Upper Extremity Assessment Upper Extremity Assessment: Generalized weakness (mild intention tremors noted)   Lower Extremity Assessment Lower Extremity Assessment: Defer to PT evaluation   Cervical / Trunk Assessment Cervical / Trunk Assessment: Kyphotic   Communication Communication Communication: No difficulties   Cognition Arousal/Alertness: Awake/alert Behavior During Therapy: WFL for tasks assessed/performed Overall Cognitive Status: History of cognitive impairments - at baseline                     General Comments       Exercises       Shoulder Instructions      Home Living Family/patient expects to be discharged to:: Skilled nursing facility                                        Prior Functioning/Environment Level of Independence: Needs assistance  Gait / Transfers Assistance Needed: uses RW-walks to dining  room ADL's / Homemaking Assistance Needed: requires assistance for ADLs        OT Diagnosis: Generalized weakness;Cognitive deficits   OT Problem List: Decreased strength;Decreased activity tolerance;Impaired balance (sitting and/or standing);Decreased cognition;Decreased safety awareness;Decreased knowledge of use of DME or AE   OT Treatment/Interventions: Self-care/ADL training;Therapeutic activities;Patient/family education;Balance training    OT Goals(Current goals can be found in the care plan section) Acute Rehab OT  Goals Patient Stated Goal: none stated OT Goal Formulation: With patient/family Time For Goal Achievement: 12/23/14 Potential to Achieve Goals: Good ADL Goals Pt Will Perform Eating: with modified independence;sitting Pt Will Transfer to Toilet: with min guard assist;ambulating;regular height toilet;bedside commode;grab bars Pt Will Perform Toileting - Clothing Manipulation and hygiene: with min guard assist;sit to/from stand  OT Frequency: Min 2X/week   Barriers to D/C: Decreased caregiver support          Co-evaluation              End of Session Equipment Utilized During Treatment: Rolling walker Nurse Communication: Mobility status  Activity Tolerance: Patient tolerated treatment well Patient left: in chair;with call bell/phone within reach;with chair alarm set;with family/visitor present   Time: 3329-5188 OT Time Calculation (min): 36 min Charges:  OT General Charges $OT Visit: 1 Procedure OT Evaluation $Initial OT Evaluation Tier I: 1 Procedure OT Treatments $Therapeutic Activity: 8-22 mins G-Codes:    Lillias Difrancesco M 12-24-2014, 9:13 AM

## 2014-12-09 NOTE — Progress Notes (Signed)
Patient ID: Jesse Burton, male   DOB: 01-23-1926, 81 y.o.   MRN: [id number]    Progress Note   Subjective   Up in chair eating breakfast- doing well no complaints, daughter at bedside-says he slept better last night   Objective   Vital signs in last 24 hours: Temp:  [98.2 F (36.8 C)-99.1 F (37.3 C)] 98.2 F (36.8 C) (10/21 0637) Pulse Rate:  [65-117] 65 (10/21 0637) Resp:  [18-20] 20 (10/21 8891) BP: (116-140)/(52-73) 127/63 mmHg (10/21 0637) SpO2:  [91 %-98 %] 91 % (10/21 0637) Last BM Date: 12/09/14 General:elderly     white male in NAD Heart:  Regular rate and rhythm; no murmurs Lungs: Respirations even and unlabored, lungs CTA bilaterally Abdomen:  Soft, nontender and nondistended. Normal bowel sounds. Extremities:  Without edema. Neurologic:  Alert and orientedx2,  grossly normal neurologically. Psych:  Cooperative. Normal mood and affect.  Intake/Output from previous day: 10/20 0701 - 10/21 0700 In: 540 [P.O.:540] Out: 400 [Urine:400] Intake/Output this shift:    Lab Results:  Recent Labs  12/07/14 2300 12/08/14 0300 12/09/14 0535  WBC 5.4 7.7 5.8  HGB 7.4* 8.1* 7.6*  HCT 23.6* 25.8* 24.2*  PLT 236 257 256   BMET  Recent Labs  12/07/14 0423 12/08/14 0042 12/09/14 0535  NA 142 138 139  K 3.9 3.6 3.4*  CL 111 108 105  CO2 25 25 25   GLUCOSE 104* 100* 94  BUN 31* 21* 18  CREATININE 1.04 0.96 1.13  CALCIUM 8.9 8.6* 8.5*   LFT  Recent Labs  12/07/14 0144  PROT 5.8*  ALBUMIN 3.6  AST 21  ALT 20  ALKPHOS 79  BILITOT 0.9   PT/INR  Recent Labs  12/07/14 0144  LABPROT 16.0*  INR 1.26    Studies/Results: Ct Abdomen Pelvis W Contrast  12/07/2014  CLINICAL DATA:  Dark stools and abdominal distention. EXAM: CT ABDOMEN AND PELVIS WITH CONTRAST TECHNIQUE: Multidetector CT imaging of the abdomen and pelvis was performed using the standard protocol following bolus administration of intravenous contrast. CONTRAST:  149mL OMNIPAQUE IOHEXOL  300 MG/ML  SOLN COMPARISON:  None. FINDINGS: Normal hepatic contour. The examination is minimally degraded secondary exclusion of the dome of the diaphragm. No discrete hepatic lesions. There are multiple layering laminated gallstones seen throughout the gallbladder. This finding is associated with a potential very minimal amount of gallbladder wall thickening and pericholecystic fluid though this may be accentuated due to a minimal amount of third spacing within the abdomen. No definite intra or extrahepatic bili duct dilatation. No ascites. There is symmetric enhancement and excretion of the bilateral kidneys. No definite renal stones in this postcontrast examination. No discrete renal lesions. There is a minimal amount of bilateral perinephric stranding, likely age and body habitus related. No urinary obstruction. Normal appearance of the bilateral adrenal glands, pancreas and spleen. Ingested enteric contrast extends to the level of the distal transverse colon. Moderate colonic stool burden without evidence of enteric obstruction. The bowel is otherwise normal in course and caliber without wall thickening. Normal appearance of the terminal ileum. There is is a minimal amount of stranding throughout the root of the abdominal mesenteric including the right lower abdominal quadrant surrounding the appendix without focality and favored to be secondary to third spacing/volume overload. Otherwise, normal appearance of the appendix. Moderate amount of mixed calcified and noncalcified atherosclerotic plaque within a normal caliber abdominal aorta. The major branch vessels of the abdominal aorta appear patent on this non CTA examination.  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 No bulky retroperitoneal, mesenteric, pelvic or inguinal lymphadenopathy. The prostate is borderline enlarged with mass effect on the undersurface urinary bladder. Otherwise normal  appearance of the urinary bladder given degree distention. There is a small amount of fluid seen within the pelvic cul-de-sac. Limited visualization of the lower thorax is degraded secondary to patient respiratory artifact. Trace bilateral pleural effusions, right greater than left, with associated dependent subpleural opacities, likely atelectasis. Cardiomegaly. Coronary artery calcifications. No pericardial effusion. No acute or aggressive osseous abnormalities. Mild-to-moderate multilevel lumbar spine DDD, worse at L2-L3 with disc space height loss, endplate irregularity and sclerosis. Mild diffuse body wall anasarca. Regional soft tissues appear otherwise normal. IMPRESSION: 1. Cardiomegaly with findings worrisome for pulmonary edema including trace bilateral effusions, right greater than left, mild diffuse body wall anasarca and minimal amount of mesenteric stranding / third-spacing throughout the peritoneum. Clinical correlation is advised. 2. Cholelithiasis with potential minimal amount of gallbladder wall thickening and pericholecystic fluid, again, potentially attributable to suspected third-spacing within the peritoneal cavity - if clinical concern persists for acute cholecystitis, further evaluation with gallbladder ultrasound and/or nuclear medicine HIDA scan could be performed as clinically indicated. 3. Coronary artery calcifications. 4. Prostatomegaly. Electronically Signed   By: Sandi Mariscal M.D.   On: 12/07/2014 11:43       Assessment / Plan:    #1 81 yo male with profound iron deficiency anemia /heme+ stool  In setting of chronic Plavix. He is stable, though hgb drifting slowly Plan is for Colon /EGD likely Monday  When he has been off Plavix 5 days Transfuse to keep Hgb in 8 range Continue PPI  #2 dementia  #3 Hx afib  #4 CAD s/p CABG #5 CHF  Principal Problem:   GIB (gastrointestinal bleeding) Active Problems:   Guaiac positive stools   Microcytic anemia   Arthritis    Hypertension   Hypothyroidism   Retinitis   Abdominal distention   Essential hypertension   A-fib (HCC)   Iron deficiency anemia due to chronic blood loss   Melena   Anemia, iron deficiency   Heme positive stool   Encounter for monitoring antiplatelet therapy     LOS: 2 days   Amy Esterwood  12/09/2014, 8:53 AM  GI ATTENDING  Interval history data reviewed. Patient seen and examined. Agree with interval progress note as outlined. Less confused. No active bleeding. Tentative plans for colonoscopy and upper endoscopy to evaluate iron deficiency anemia and Hemoccult-positive stool, Monday. We will reassess the patient Sunday regarding this.  Docia Chuck. Geri Seminole., M.D. [facility] Division of Gastroenterology

## 2014-12-10 LAB — CBC
HEMATOCRIT: 25.5 % — AB (ref 39.0–52.0)
Hemoglobin: 7.8 g/dL — ABNORMAL LOW (ref 13.0–17.0)
MCH: 24 pg — ABNORMAL LOW (ref 26.0–34.0)
MCHC: 30.6 g/dL (ref 30.0–36.0)
MCV: 78.5 fL (ref 78.0–100.0)
PLATELETS: 275 10*3/uL (ref 150–400)
RBC: 3.25 MIL/uL — AB (ref 4.22–5.81)
RDW: 16.8 % — AB (ref 11.5–15.5)
WBC: 6.6 10*3/uL (ref 4.0–10.5)

## 2014-12-10 LAB — BASIC METABOLIC PANEL
ANION GAP: 5 (ref 5–15)
BUN: 25 mg/dL — ABNORMAL HIGH (ref 6–20)
CALCIUM: 8.7 mg/dL — AB (ref 8.9–10.3)
CO2: 28 mmol/L (ref 22–32)
Chloride: 107 mmol/L (ref 101–111)
Creatinine, Ser: 1.08 mg/dL (ref 0.61–1.24)
GFR, EST NON AFRICAN AMERICAN: 60 mL/min — AB (ref >60)
Glucose, Bld: 103 mg/dL — ABNORMAL HIGH (ref 65–99)
POTASSIUM: 4 mmol/L (ref 3.5–5.1)
Sodium: 140 mmol/L (ref 135–145)

## 2014-12-10 LAB — GLUCOSE, CAPILLARY: Glucose-Capillary: 94 mg/dL (ref 65–99)

## 2014-12-10 MED ORDER — FUROSEMIDE 40 MG PO TABS
40.0000 mg | ORAL_TABLET | Freq: Every day | ORAL | Status: DC
  Administered 2014-12-11 – 2014-12-14 (×4): 40 mg via ORAL
  Filled 2014-12-10 (×4): qty 1

## 2014-12-10 NOTE — Progress Notes (Signed)
[facility] PROGRESS NOTE  Hillery Bhalla GQQ:[id number] DOB: [birth year] DOA: 12/07/2014 PCP: No primary care provider on file.  Assessment/Plan: Iron deficiency anemia/Heme positive stools -held plavix, s/p 3 units PRBC -continue IV PPI Q12 -Remote history of colonoscopy does not recall any abnormalities, daughter thinks this was more than 10 years ago -CT abdomen with edema/CHF -lasix ordered -GI following, plan for EGD and colonoscopy on Monday Hopefully start prep tomorrow, Hb stable  Dementia with sundowning -agitated overnight 10/19-20 and got Ativan x1 dose -avoid Benzos for delirium, Low dose Haldol PRN if needed, re-orientation techniques -better since yesterday  CHF/? Diastolic -improved with diuresis, ECHO with preserved EF -change to PO Lasix  CAD: s/p of CABG. -stable, held plavix   Arthritis: -Tylenol and tramadol  Retinitis: - On cellcept - continue home eye drops   Atrial Fibrillation: CHA2DS2-VASc Score is 3, ideally needs oral anticoagulation, not Anticoagulation at home, possibly due to old age and high risk of fall.  -HR improved now, was uncontrolled 10/20 afternoon and started on Metoprolol, now stable  - continue digoxin, level is 0.5  HLD:  -Continue Lipitor  Hypothyroidism:  -Continue Synthroid  BPH: stable - Continue Flomax  DVT ppx: SCDs  Code Status: Full code Family Communication: daughter at bed side Disposition Plan:  SNF pending workup        HPI/Subjective: More alert this am, no events  Objective: Filed Vitals:   12/10/14 0445  BP: 121/95  Pulse: 72  Temp: 98 F (36.7 C)  Resp: 16    Intake/Output Summary (Last 24 hours) at 12/10/14 1330 Last data filed at 12/10/14 0800  Gross per 24 hour  Intake    360 ml  Output    400 ml  Net    -40 ml   Filed Weights   12/07/14 0356 12/08/14 0709 12/10/14 0445  Weight: 90.5 kg (199 lb 8.3 oz) 88.1 kg (194 lb 3.6 oz) 86 kg (189 lb 9.5 oz)     Exam:   General:   alert, talkative, more appropriate this am  Cardiovascular: S1S2/RRR  Respiratory: CTAB  Abdomen: soft, NT, BS present  Musculoskeletal: no edema c/c   Data Reviewed: Basic Metabolic Panel:  Recent Labs Lab 12/07/14 0144 12/07/14 0423 12/08/14 0042 12/09/14 0535 12/10/14 0555  NA 140 142 138 139 140  K 4.0 3.9 3.6 3.4* 4.0  CL 110 111 108 105 107  CO2 25 25 25 25 28   GLUCOSE 111* 104* 100* 94 103*  BUN 32* 31* 21* 18 25*  CREATININE 1.12 1.04 0.96 1.13 1.08  CALCIUM 8.6* 8.9 8.6* 8.5* 8.7*   Liver Function Tests:  Recent Labs Lab 12/07/14 0144  AST 21  ALT 20  ALKPHOS 79  BILITOT 0.9  PROT 5.8*  ALBUMIN 3.6   No results for input(s): LIPASE, AMYLASE in the last 168 hours. No results for input(s): AMMONIA in the last 168 hours. CBC:  Recent Labs Lab 12/07/14 0144  12/07/14 1400 12/07/14 2300 12/08/14 0300 12/09/14 0535 12/10/14 0555  WBC 4.3  < > 5.2 5.4 7.7 5.8 6.6  NEUTROABS 2.7  --   --   --   --   --   --   HGB 4.9*  < > 6.7* 7.4* 8.1* 7.6* 7.8*  HCT 16.5*  < > 21.9* 23.6* 25.8* 24.2* 25.5*  MCV 77.5*  < > 77.9* 77.6* 77.9* 78.3 78.5  PLT 273  < > 262 236 257 256 275  < > = values in  this interval not displayed. Cardiac Enzymes: No results for input(s): CKTOTAL, CKMB, CKMBINDEX, TROPONINI in the last 168 hours. BNP (last 3 results)  Recent Labs  12/07/14 0423  BNP 630.8*    ProBNP (last 3 results) No results for input(s): PROBNP in the last 8760 hours.  CBG:  Recent Labs Lab 12/08/14 0810 12/09/14 0827 12/10/14 0741  GLUCAP 99 116* 94    Recent Results (from the past 240 hour(s))  MRSA PCR Screening     Status: None   Collection Time: 12/07/14  4:34 AM  Result Value Ref Range Status   MRSA by PCR NEGATIVE NEGATIVE Final    Comment:        The GeneXpert MRSA Assay (FDA approved for NASAL specimens only), is one component of a comprehensive MRSA colonization surveillance program. It is  not intended to diagnose MRSA infection nor to guide or monitor treatment for MRSA infections.      Studies: No results found.  Scheduled Meds: . atorvastatin  40 mg Oral q1800  . digoxin  0.125 mg Oral Daily  . erythromycin  1 application Right Eye QHS  . furosemide  20 mg Intravenous BID  . l-methylfolate-B6-B12  1 tablet Oral Daily  . levothyroxine  88 mcg Oral QAC breakfast  . losartan  50 mg Oral Daily  . metoprolol tartrate  25 mg Oral BID  . multivitamin with minerals  1 tablet Oral Daily  . mycophenolate  500 mg Oral Daily  . pantoprazole (PROTONIX) IV  40 mg Intravenous Q12H  . potassium chloride  40 mEq Oral BID  . sodium chloride  3 mL Intravenous Q12H  . tamsulosin  0.8 mg Oral Daily  . traMADol  50 mg Oral BID   Continuous Infusions:  Antibiotics Given (last 72 hours)    None      Principal Problem:   GIB (gastrointestinal bleeding) Active Problems:   Guaiac positive stools   Microcytic anemia   Arthritis   Hypertension   Hypothyroidism   Retinitis   Abdominal distention   Essential hypertension   A-fib (HCC)   Iron deficiency anemia due to chronic blood loss   Melena   Anemia, iron deficiency   Heme positive stool   Encounter for monitoring antiplatelet therapy    Time spent: 49min    Neithan Day  [facility] Pager [phone]. If 7PM-7AM, please contact night-coverage at [email or web address], password [facility] 12/10/2014, 1:30 PM  LOS: 3 days

## 2014-12-11 ENCOUNTER — Other Ambulatory Visit: Payer: Self-pay

## 2014-12-11 LAB — BASIC METABOLIC PANEL
Anion gap: 5 (ref 5–15)
BUN: 26 mg/dL — AB (ref 6–20)
CALCIUM: 8.8 mg/dL — AB (ref 8.9–10.3)
CO2: 25 mmol/L (ref 22–32)
CREATININE: 1.22 mg/dL (ref 0.61–1.24)
Chloride: 107 mmol/L (ref 101–111)
GFR calc Af Amer: 60 mL/min — ABNORMAL LOW (ref >60)
GFR calc non Af Amer: 51 mL/min — ABNORMAL LOW (ref >60)
GLUCOSE: 101 mg/dL — AB (ref 65–99)
Potassium: 4.3 mmol/L (ref 3.5–5.1)
Sodium: 137 mmol/L (ref 135–145)

## 2014-12-11 LAB — CBC
HEMATOCRIT: 25.5 % — AB (ref 39.0–52.0)
Hemoglobin: 7.8 g/dL — ABNORMAL LOW (ref 13.0–17.0)
MCH: 24.3 pg — ABNORMAL LOW (ref 26.0–34.0)
MCHC: 30.6 g/dL (ref 30.0–36.0)
MCV: 79.4 fL (ref 78.0–100.0)
Platelets: 276 10*3/uL (ref 150–400)
RBC: 3.21 MIL/uL — AB (ref 4.22–5.81)
RDW: 17.9 % — AB (ref 11.5–15.5)
WBC: 6 10*3/uL (ref 4.0–10.5)

## 2014-12-11 LAB — GLUCOSE, CAPILLARY: Glucose-Capillary: 106 mg/dL — ABNORMAL HIGH (ref 65–99)

## 2014-12-11 MED ORDER — PEG-KCL-NACL-NASULF-NA ASC-C 100 G PO SOLR: 1.0000 | Freq: Once | ORAL | Status: DC

## 2014-12-11 MED ORDER — PEG-KCL-NACL-NASULF-NA ASC-C 100 G PO SOLR
0.5000 | Freq: Once | ORAL | Status: AC
  Administered 2014-12-11: 100 g via ORAL

## 2014-12-11 MED ORDER — PEG-KCL-NACL-NASULF-NA ASC-C 100 G PO SOLR
0.5000 | Freq: Once | ORAL | Status: AC
  Administered 2014-12-11: 100 g via ORAL
  Filled 2014-12-11: qty 1

## 2014-12-11 NOTE — Progress Notes (Signed)
[facility] PROGRESS NOTE  Jesse Burton WER:[id number] DOB: January 29, 1926 DOA: 12/07/2014 PCP: No primary care provider on file.  Assessment/Plan: Iron deficiency anemia/Heme positive stools -held plavix, s/p 3 units PRBC on day 1 -continue IV PPI Q12 -Remote history of colonoscopy does not recall any abnormalities, daughter thinks this was more than 10 years ago -GI following, plan for EGD and colonoscopy on Monday -prep today -hb stable at 7.8  Dementia with sundowning -agitated overnight 10/19-20 and got Ativan x1 dose -avoid Benzos for delirium, Low dose Haldol PRN if needed, re-orientation techniques -improved since  CHF/? Diastolic -improved with diuresis, ECHO with preserved EF -changed to PO Lasix-same dose  CAD: s/p of CABG. -stable, held plavix   Arthritis: -Tylenol and tramadol  Retinitis: - On cellcept - continue home eye drops   Atrial Fibrillation: CHA2DS2-VASc Score is 3, ideally needs oral anticoagulation, not Anticoagulation at home, possibly due to old age and high risk of fall.  -HR improved now, was uncontrolled 10/20 afternoon and started on Metoprolol, now stable  - continue digoxin, level is 0.5  HLD:  -Continue Lipitor  Hypothyroidism:  -Continue Synthroid  BPH: stable - Continue Flomax  DVT ppx: SCDs  Code Status: Full code Family Communication: son at bed side Disposition Plan:  SNF pending workup        HPI/Subjective: More alert this am, no events, wants to pee  Objective: Filed Vitals:   12/11/14 0432  BP: 113/72  Pulse: 72  Temp: 98.2 F (36.8 C)  Resp: 18    Intake/Output Summary (Last 24 hours) at 12/11/14 1152 Last data filed at 12/11/14 0900  Gross per 24 hour  Intake    480 ml  Output    650 ml  Net   -170 ml   Filed Weights   12/08/14 0709 12/10/14 0445 12/11/14 0453  Weight: 88.1 kg (194 lb 3.6 oz) 86 kg (189 lb 9.5 oz) 88.2 kg (194 lb 7.1 oz)    Exam:   General:   alert, talkative, less  confused  Cardiovascular: S1S2/RRR  Respiratory: CTAB  Abdomen: soft, NT, BS present  Musculoskeletal: no edema c/c   Data Reviewed: Basic Metabolic Panel:  Recent Labs Lab 12/07/14 0423 12/08/14 0042 12/09/14 0535 12/10/14 0555 12/11/14 0510  NA 142 138 139 140 137  K 3.9 3.6 3.4* 4.0 4.3  CL 111 108 105 107 107  CO2 _0 GLUCOSE 104* 100* 94 103* 101*  BUN 31* 21* 18 25* 26*  CREATININE 1.04 0.96 1.13 1.08 1.22  CALCIUM 8.9 8.6* 8.5* 8.7* 8.8*   Liver Function Tests:  Recent Labs Lab 12/07/14 0144  AST 21  ALT 20  ALKPHOS 79  BILITOT 0.9  PROT 5.8*  ALBUMIN 3.6   No results for input(s): LIPASE, AMYLASE in the last 168 hours. No results for input(s): AMMONIA in the last 168 hours. CBC:  Recent Labs Lab 12/07/14 0144  12/07/14 2300 12/08/14 0300 12/09/14 0535 12/10/14 0555 12/11/14 0510  WBC 4.3  < > 5.4 7.7 5.8 6.6 6.0  NEUTROABS 2.7  --   --   --   --   --   --   HGB 4.9*  < > 7.4* 8.1* 7.6* 7.8* 7.8*  HCT 16.5*  < > 23.6* 25.8* 24.2* 25.5* 25.5*  MCV 77.5*  < > 77.6* 77.9* 78.3 78.5 79.4  PLT 273  < > 236 257 256 275 276  < > = values in this interval not  displayed. Cardiac Enzymes: No results for input(s): CKTOTAL, CKMB, CKMBINDEX, TROPONINI in the last 168 hours. BNP (last 3 results)  Recent Labs  12/07/14 0423  BNP 630.8*    ProBNP (last 3 results) No results for input(s): PROBNP in the last 8760 hours.  CBG:  Recent Labs Lab 12/08/14 0810 12/09/14 0827 12/10/14 0741 12/11/14 0805  GLUCAP 99 116* 94 106*    Recent Results (from the past 240 hour(s))  MRSA PCR Screening     Status: None   Collection Time: 12/07/14  4:34 AM  Result Value Ref Range Status   MRSA by PCR NEGATIVE NEGATIVE Final    Comment:        The GeneXpert MRSA Assay (FDA approved for NASAL specimens only), is one component of a comprehensive MRSA colonization surveillance program. It is not intended to diagnose MRSA infection nor to  guide or monitor treatment for MRSA infections.      Studies: No results found.  Scheduled Meds: . atorvastatin  40 mg Oral q1800  . digoxin  0.125 mg Oral Daily  . erythromycin  1 application Right Eye QHS  . furosemide  40 mg Oral Daily  . l-methylfolate-B6-B12  1 tablet Oral Daily  . levothyroxine  88 mcg Oral QAC breakfast  . losartan  50 mg Oral Daily  . metoprolol tartrate  25 mg Oral BID  . multivitamin with minerals  1 tablet Oral Daily  . mycophenolate  500 mg Oral Daily  . pantoprazole (PROTONIX) IV  40 mg Intravenous Q12H  . peg 3350 powder  0.5 kit Oral Once   And  . peg 3350 powder  0.5 kit Oral Once  . potassium chloride  40 mEq Oral BID  . sodium chloride  3 mL Intravenous Q12H  . tamsulosin  0.8 mg Oral Daily  . traMADol  50 mg Oral BID   Continuous Infusions:  Antibiotics Given (last 72 hours)    None      Principal Problem:   GIB (gastrointestinal bleeding) Active Problems:   Guaiac positive stools   Microcytic anemia   Arthritis   Hypertension   Hypothyroidism   Retinitis   Abdominal distention   Essential hypertension   A-fib (HCC)   Iron deficiency anemia due to chronic blood loss   Melena   Anemia, iron deficiency   Heme positive stool   Encounter for monitoring antiplatelet therapy    Time spent: 7mn    Jesse Burton  [facility] Pager [phone] If 7PM-7AM, please contact night-coverage at [email or web address], password [email or web address]10/23/2016, 11:52 AM  LOS: 4 days

## 2014-12-11 NOTE — Progress Notes (Signed)
Patient ID: Jesse Burton, male   DOB: [birth year], 81 y.o.   MRN: [id number]    Progress Note   Subjective   Pleasantly confused, family in room. No complaints Hgb 7.8  Stable post transfusions     Objective   Vital signs in last 24 hours: Temp:  [97.9 F (36.6 C)-98.6 F (37 C)] 98.2 F (36.8 C) (10/23 0432) Pulse Rate:  [67-72] 72 (10/23 0432) Resp:  [16-18] 18 (10/23 0432) BP: (98-113)/(62-72) 113/72 mmHg (10/23 0432) SpO2:  [96 %-100 %] 96 % (10/23 0432) Weight:  [194 lb 7.1 oz (88.2 kg)] 194 lb 7.1 oz (88.2 kg) (10/23 0453) Last BM Date: 12/10/14 General:    Elderly WM in NAD Heart:  irRegular rate and rhythm; no murmurs Lungs: Respirations even and unlabored, lungs CTA bilaterally Abdomen:  Soft, nontender and nondistended. Normal bowel sounds. Extremities:  Without edema. Neurologic:  Alert and oriented x 2,  grossly normal neurologically. Psych:  Cooperative. Normal mood and affect.  Intake/Output from previous day: 10/22 0701 - 10/23 0700 In: 240 [P.O.:240] Out: 850 [Urine:850] Intake/Output this shift:    Lab Results:  Recent Labs  12/09/14 0535 12/10/14 0555 12/11/14 0510  WBC 5.8 6.6 6.0  HGB 7.6* 7.8* 7.8*  HCT 24.2* 25.5* 25.5*  PLT 256 275 276   BMET  Recent Labs  12/09/14 0535 12/10/14 0555 12/11/14 0510  NA 139 140 137  K 3.4* 4.0 4.3  CL 105 107 107  CO2 25 28 25   GLUCOSE 94 103* 101*  BUN 18 25* 26*  CREATININE 1.13 1.08 1.22  CALCIUM 8.5* 8.7* 8.8*   LFT No results for input(s): PROT, ALBUMIN, AST, ALT, ALKPHOS, BILITOT, BILIDIR, IBILI in the last 72 hours. PT/INR No results for input(s): LABPROT, INR in the last 72 hours.    Assessment / Plan:     #1  81 yo male  With profound iron deficiency anemia /heme + stool in setting of chronic Plavix use HGB 4.9 on admit Plavix has been on hold since admit  No active bleeding  #2 Dementia #3 CAD s/p CABG #4 hx AFIB   Plan; Transfuse if hgb drifts Will schedule for  EGD/Colon tomorrow with Dr Carlean Purl . Procedures discussed in detail with pt and daughter and agreeable to proceed. ( scheduled for 1:15 pm) Principal Problem:   GIB (gastrointestinal bleeding) Active Problems:   Guaiac positive stools   Microcytic anemia   Arthritis   Hypertension   Hypothyroidism   Retinitis   Abdominal distention   Essential hypertension   A-fib (HCC)   Iron deficiency anemia due to chronic blood loss   Melena   Anemia, iron deficiency   Heme positive stool   Encounter for monitoring antiplatelet therapy     LOS: 4 days   Amy Esterwood  12/11/2014, 8:52 AM  GI ATTENDING  Interval history and data reviewed. Patient personally seen and examined. Son in room. Agree with interval progress note as outlined above. Patient has had no gross GI bleeding. Hemoglobin remained stable. Plans for colonoscopy and upper endoscopy to evaluate profound iron deficiency anemia and Hemoccult-positive stool. Planned tomorrow with Dr. Carlean Purl.The nature of the procedure, as well as the risks, benefits, and alternatives were carefully and thoroughly reviewed with the patient (and son even patient's cognitive impairment). Ample time for discussion and questions allowed. The patient understood, was satisfied, and agreed to proceed.  Docia Chuck. Geri Seminole., M.D. [facility] Division of Gastroenterology

## 2014-12-12 ENCOUNTER — Encounter (HOSPITAL_COMMUNITY)
Admission: EM | Disposition: A | Discharge status: Skilled Nursing Facility | Payer: Self-pay | Source: Home / Self Care | Attending: Internal Medicine

## 2014-12-12 ENCOUNTER — Encounter (HOSPITAL_COMMUNITY): Payer: Self-pay | Admitting: Anesthesiology

## 2014-12-12 ENCOUNTER — Inpatient Hospital Stay (HOSPITAL_COMMUNITY): Payer: Medicare Other | Admitting: Anesthesiology

## 2014-12-12 DIAGNOSIS — C184 Malignant neoplasm of transverse colon: Principal | ICD-10-CM

## 2014-12-12 DIAGNOSIS — C189 Malignant neoplasm of colon, unspecified: Secondary | ICD-10-CM | POA: Diagnosis present

## 2014-12-12 HISTORY — PX: COLONOSCOPY WITH PROPOFOL: SHX5780

## 2014-12-12 LAB — BASIC METABOLIC PANEL
ANION GAP: 7 (ref 5–15)
BUN: 21 mg/dL — ABNORMAL HIGH (ref 6–20)
CALCIUM: 8.8 mg/dL — AB (ref 8.9–10.3)
CHLORIDE: 107 mmol/L (ref 101–111)
CO2: 26 mmol/L (ref 22–32)
CREATININE: 1 mg/dL (ref 0.61–1.24)
GFR calc non Af Amer: >60 mL/min (ref >60)
Glucose, Bld: 94 mg/dL (ref 65–99)
Potassium: 4.4 mmol/L (ref 3.5–5.1)
SODIUM: 140 mmol/L (ref 135–145)

## 2014-12-12 LAB — CBC
HEMATOCRIT: 25.6 % — AB (ref 39.0–52.0)
HEMOGLOBIN: 7.7 g/dL — AB (ref 13.0–17.0)
MCH: 24.2 pg — ABNORMAL LOW (ref 26.0–34.0)
MCHC: 30.1 g/dL (ref 30.0–36.0)
MCV: 80.5 fL (ref 78.0–100.0)
Platelets: 253 10*3/uL (ref 150–400)
RBC: 3.18 MIL/uL — ABNORMAL LOW (ref 4.22–5.81)
RDW: 18.2 % — AB (ref 11.5–15.5)
WBC: 4.8 10*3/uL (ref 4.0–10.5)

## 2014-12-12 LAB — GLUCOSE, CAPILLARY: GLUCOSE-CAPILLARY: 102 mg/dL — AB (ref 65–99)

## 2014-12-12 SURGERY — COLONOSCOPY WITH PROPOFOL
Anesthesia: Monitor Anesthesia Care

## 2014-12-12 MED ORDER — PROPOFOL 10 MG/ML IV BOLUS
INTRAVENOUS | Status: DC | PRN
  Administered 2014-12-12 (×10): 20 mg via INTRAVENOUS
  Administered 2014-12-12: 10 mg via INTRAVENOUS
  Administered 2014-12-12 (×8): 20 mg via INTRAVENOUS
  Administered 2014-12-12: 10 mg via INTRAVENOUS
  Administered 2014-12-12 (×3): 20 mg via INTRAVENOUS

## 2014-12-12 MED ORDER — SPOT INK MARKER SYRINGE KIT
PACK | SUBMUCOSAL | Status: AC
  Filled 2014-12-12: qty 10

## 2014-12-12 MED ORDER — POTASSIUM CHLORIDE CRYS ER 20 MEQ PO TBCR
40.0000 meq | EXTENDED_RELEASE_TABLET | Freq: Every day | ORAL | Status: DC
  Administered 2014-12-13 – 2014-12-14 (×2): 40 meq via ORAL
  Filled 2014-12-12 (×2): qty 2

## 2014-12-12 MED ORDER — PROPOFOL 10 MG/ML IV BOLUS
INTRAVENOUS | Status: AC
  Filled 2014-12-12: qty 20

## 2014-12-12 MED ORDER — LACTATED RINGERS IV SOLN
INTRAVENOUS | Status: DC
  Administered 2014-12-12: 1000 mL via INTRAVENOUS

## 2014-12-12 MED ORDER — PHENYLEPHRINE HCL 10 MG/ML IJ SOLN
INTRAMUSCULAR | Status: DC | PRN
  Administered 2014-12-12 (×3): 80 ug via INTRAVENOUS

## 2014-12-12 MED ORDER — PHENYLEPHRINE 40 MCG/ML (10ML) SYRINGE FOR IV PUSH (FOR BLOOD PRESSURE SUPPORT)
PREFILLED_SYRINGE | INTRAVENOUS | Status: AC
  Filled 2014-12-12: qty 10

## 2014-12-12 SURGICAL SUPPLY — 25 items

## 2014-12-12 NOTE — Anesthesia Preprocedure Evaluation (Signed)
Anesthesia Evaluation  Patient identified by MRN, date of birth, ID band Patient awake    Reviewed: Allergy & Precautions, NPO status , Patient's Chart, lab work & pertinent test results  Airway Mallampati: II  TM Distance: >3 FB Neck ROM: Full    Dental no notable dental hx.    Pulmonary neg pulmonary ROS, former smoker,    Pulmonary exam normal breath sounds clear to auscultation       Cardiovascular hypertension, Pt. on medications + CAD  negative cardio ROS Normal cardiovascular exam Rhythm:Regular Rate:Normal     Neuro/Psych  Neuromuscular disease negative psych ROS   GI/Hepatic negative GI ROS, Neg liver ROS,   Endo/Other  Hypothyroidism   Renal/GU negative Renal ROS  negative genitourinary   Musculoskeletal  (+) Arthritis ,   Abdominal   Peds negative pediatric ROS (+)  Hematology  (+) anemia ,   Anesthesia Other Findings   Reproductive/Obstetrics negative OB ROS                             Anesthesia Physical Anesthesia Plan  ASA: III  Anesthesia Plan: MAC   Post-op Pain Management:    Induction: Intravenous  Airway Management Planned: Natural Airway  Additional Equipment:   Intra-op Plan:   Post-operative Plan:   Informed Consent: I have reviewed the patients History and Physical, chart, labs and discussed the procedure including the risks, benefits and alternatives for the proposed anesthesia with the patient or authorized representative who has indicated his/her understanding and acceptance.   Dental advisory given  Plan Discussed with: CRNA  Anesthesia Plan Comments:         Anesthesia Quick Evaluation

## 2014-12-12 NOTE — Progress Notes (Signed)
Patient for EGD and colonoscopy this afternoon for evaluation of anemia and heme positive stools in the setting of chronic Plavix use.  Hgb stable at 7.8 grams this AM.  Patient did well drinking prep.

## 2014-12-12 NOTE — Op Note (Signed)
[facility] [place], [id number]   COLONOSCOPY PROCEDURE REPORT  PATIENT: Jesse Burton, Jesse Burton  MR#: [id number] BIRTHDATE: Apr 15, 1925 , 54  yrs. old GENDER: male ENDOSCOPIST: Gatha Mayer, MD, [facility] PROCEDURE DATE:  12/12/2014 PROCEDURE:   Colonoscopy, diagnostic, Colonoscopy with biopsy, and Submucosal injection, any substance First Screening Colonoscopy - Avg.  risk and is 50 yrs.  old or older - No.  Prior Negative Screening - Now for repeat screening. N/A  History of Adenoma - Now for follow-up colonoscopy & has been > or = to 3 yrs.  N/A History of Adenoma - Now for follow-up colonoscopy & has been > or = to 3 yrs.  Polyps removed today? No Recommend repeat exam, <10 yrs? No ASA CLASS:   Class III INDICATIONS:Unexplained iron deficiency anemia and Patient is not applicable for Colorectal Neoplasm Risk Assessment for this procedure. MEDICATIONS: Per Anesthesia and Monitored anesthesia care  DESCRIPTION OF PROCEDURE:   After the risks benefits and alternatives of the procedure were thoroughly explained, informed consent was obtained.  The digital rectal exam revealed no rectal mass.   The Pentax Colonoscope [id number]  endoscope was introduced through the anus and advanced to the cecum, which was identified by both the appendix and ileocecal valve. No adverse events experienced.   The quality of the prep was adequate fair.  The instrument was then slowly withdrawn as the colon was fully examined. Estimated blood loss is zero unless otherwise noted in this procedure report.   COLON FINDINGS: A half circumferential large polypoid shaped, fungating and ulcerated mass was found in the distal transverse colon.  Multiple biopsies were performed.   There was severe diverticulosis noted in the left colon.   The examination was otherwise normal.  Retroflexed views revealed no abnormalities. The time to cecum = 25.2 Withdrawal time = 16.2   The scope  was withdrawn and the procedure completed. COMPLICATIONS: There were no immediate complications.  ENDOSCOPIC IMPRESSION: 1.   Half circumferential large mass was found in the distal transverse colon; multiple biopsies were performed 2.   Severe diverticulosis was noted in the left colon 3.   The examination was otherwise normal - fair/adequate appointment  RECOMMENDATIONS: 1.  Await biopsy results 2.  Stay off Plavix 3.  Consider surgical consult vs palliation only Hold off on furthertesting/consult until biopsies back and discussion with family.  eSigned:  Gatha Mayer, MD, [facility] 12/12/2014 2:56 PM

## 2014-12-12 NOTE — Anesthesia Postprocedure Evaluation (Signed)
  Anesthesia Post-op Note  Patient: Jesse Burton  Procedure(s) Performed: Procedure(s) (LRB): COLONOSCOPY WITH PROPOFOL (N/A)  Patient Location: PACU  Anesthesia Type: MAC  Level of Consciousness: awake and alert   Airway and Oxygen Therapy: Patient Spontanous Breathing  Post-op Pain: mild  Post-op Assessment: Post-op Vital signs reviewed, Patient's Cardiovascular Status Stable, Respiratory Function Stable, Patent Airway and No signs of Nausea or vomiting  Last Vitals:  Filed Vitals:   12/12/14 1510  BP: 100/47  Pulse: 59  Temp:   Resp: 14    Post-op Vital Signs: stable   Complications: No apparent anesthesia complications

## 2014-12-12 NOTE — Care Management Note (Signed)
Case Management Note  Patient Details  Name: Beecher Furio MRN: [id number] Date of Birth: 1925/07/11  Subjective/Objective: 81 y/o m admitted w/GIB, s/p colonoscopy/EGD today. Await results. From SNF.                   Action/Plan:d/c plan return SNF.   Expected Discharge Date:                  Expected Discharge Plan:  Skilled Nursing Facility  In-House Referral:  Clinical Social Work  Discharge planning Services  CM Consult  Post Acute Care Choice:    Choice offered to:     DME Arranged:    DME Agency:     HH Arranged:    [place] Agency:     Status of Service:  In process, will continue to follow  Medicare Important Message Given:  Yes-second notification given Date Medicare IM Given:    Medicare IM give by:    Date Additional Medicare IM Given:    Additional Medicare Important Message give by:     If discussed at [place] of Stay Meetings, dates discussed:    Additional Comments:  Dessa Phi, RN 12/12/2014, 2:08 PM

## 2014-12-12 NOTE — Progress Notes (Signed)
PT Cancellation Note  Patient Details Name: Jesse Burton MRN: [id number] DOB: 27-Jul-1925   Cancelled Treatment:     colonoscopy scheduled for today    [facility] 12/12/2014, 2:09 PM   Rica Koyanagi  PTA [facility]  Acute  Rehab Pager      [phone]

## 2014-12-12 NOTE — Transfer of Care (Signed)
Immediate Anesthesia Transfer of Care Note  Patient: Jesse Burton  Procedure(s) Performed: Procedure(s): COLONOSCOPY WITH PROPOFOL (N/A) ESOPHAGOGASTRODUODENOSCOPY (EGD) WITH PROPOFOL (N/A)  Patient Location: PACU  Anesthesia Type:MAC  Level of Consciousness: sedated  Airway & Oxygen Therapy: Patient Spontanous Breathing and Patient connected to nasal cannula oxygen  Post-op Assessment: Report given to RN and Post -op Vital signs reviewed and stable  Post vital signs: Reviewed and stable  Last Vitals:  Filed Vitals:   12/12/14 1313  BP: 132/67  Pulse: 58  Temp: 36.7 C  Resp: 15    Complications: No apparent anesthesia complications

## 2014-12-12 NOTE — Progress Notes (Signed)
   I spoke to local daughter. Another one lives in [place] and is [place]. I did not explain results of colonoscopy to him given post-sedation state and dementia.  Anticipate getting a surgical consult in AM to see about possibility of surgery. I will order a CEA also.  Suspect he will need cardiology consult if to have surgery.  CT abd/pelvis did not show any mets but changes c/w CHF and edema  Gatha Mayer, MD, [facility] [phone] (pager) 12/12/2014 3:10 PM

## 2014-12-12 NOTE — Progress Notes (Addendum)
[facility] PROGRESS NOTE  Jesse Burton GHW:[id number] DOB: Mar 31, 1925 DOA: 12/07/2014 PCP: No primary care provider on file.   Narrative: Jesse Burton is a 81 y.o. male with PMH of Dementia, hypertension, hyperlipidemia, hypothyroidism, arthritis, CAD/CABG, retinitis, BPH, atrial fibrillation (not on anticoagulation), who presented with low hemoglobin. He was brought in ED after he was found to have low Hgb of 4.5 in [facility]. Since admission, seen by GI, plavix was stopped and received 3 units PRBC since then his Hb has been stable in the 7.8 range, underwent Colonoscopy 10/24  Assessment/Plan: Iron deficiency anemia/Heme positive stools -held plavix, s/p 3 units PRBC on day 1, Plavix stopped -continue IV PPI Q12 -Remote history of colonoscopy -GI following, for EGD and colonoscopy today -tolerated prep well, Hb stable  Dementia with sundowning -agitated overnight 10/19-20 and got Ativan x1 dose -avoid Benzos for delirium, Low dose Haldol PRN if needed, re-orientation techniques -improved since, no significant episodes since   Acute on chronic Diastolic CHF -improved with diuresis, ECHO with preserved EF -changed to PO Lasix-same dose  CAD: s/p of CABG. -stable, held plavix   Arthritis: -Tylenol and tramadol  Retinitis: - On cellcept - continue home eye drops   Atrial Fibrillation: CHA2DS2-VASc Score is 3, ideally needs oral anticoagulation, not Anticoagulation at home, possibly due to old age and high risk of fall.  -HR improved now, was uncontrolled 10/20 afternoon and started on Metoprolol, now stable  - continue digoxin, level is 0.5  HLD:  -Continue Lipitor  Hypothyroidism:  -Continue Synthroid  BPH: stable - Continue Flomax  DVT ppx: SCDs  Code Status: Full code Family Communication: son at bed side Disposition Plan:  SNF pending workup        HPI/Subjective: No complaints, no evnets overnight, pleasantly  confused  Objective: Filed Vitals:   12/12/14 0510  BP: 122/64  Pulse: 86  Temp: 98.4 F (36.9 C)  Resp: 20    Intake/Output Summary (Last 24 hours) at 12/12/14 1256 Last data filed at 12/11/14 2326  Gross per 24 hour  Intake    240 ml  Output      0 ml  Net    240 ml   Filed Weights   12/10/14 0445 12/11/14 0453 12/12/14 0510  Weight: 86 kg (189 lb 9.5 oz) 88.2 kg (194 lb 7.1 oz) 86.5 kg (190 lb 11.2 oz)    Exam:   General:   alert, talkative, oriented to self, place  Cardiovascular: S1S2/RRR  Respiratory: CTAB  Abdomen: soft, NT, BS present  Musculoskeletal: no edema c/c   Data Reviewed: Basic Metabolic Panel:  Recent Labs Lab 12/08/14 0042 12/09/14 0535 12/10/14 0555 12/11/14 0510 12/12/14 0533  NA 138 139 140 137 140  K 3.6 3.4* 4.0 4.3 4.4  CL 108 105 107 107 107  CO2 25 25 28 25 26   GLUCOSE 100* 94 103* 101* 94  BUN 21* 18 25* 26* 21*  CREATININE 0.96 1.13 1.08 1.22 1.00  CALCIUM 8.6* 8.5* 8.7* 8.8* 8.8*   Liver Function Tests:  Recent Labs Lab 12/07/14 0144  AST 21  ALT 20  ALKPHOS 79  BILITOT 0.9  PROT 5.8*  ALBUMIN 3.6   No results for input(s): LIPASE, AMYLASE in the last 168 hours. No results for input(s): AMMONIA in the last 168 hours. CBC:  Recent Labs Lab 12/07/14 0144  12/08/14 0300 12/09/14 0535 12/10/14 0555 12/11/14 0510 12/12/14 0533  WBC 4.3  < > 7.7 5.8 6.6  6.0 4.8  NEUTROABS 2.7  --   --   --   --   --   --   HGB 4.9*  < > 8.1* 7.6* 7.8* 7.8* 7.7*  HCT 16.5*  < > 25.8* 24.2* 25.5* 25.5* 25.6*  MCV 77.5*  < > 77.9* 78.3 78.5 79.4 80.5  PLT 273  < > 257 256 275 276 253  < > = values in this interval not displayed. Cardiac Enzymes: No results for input(s): CKTOTAL, CKMB, CKMBINDEX, TROPONINI in the last 168 hours. BNP (last 3 results)  Recent Labs  12/07/14 0423  BNP 630.8*    ProBNP (last 3 results) No results for input(s): PROBNP in the last 8760 hours.  CBG:  Recent Labs Lab 12/08/14 0810  12/09/14 0827 12/10/14 0741 12/11/14 0805 12/12/14 0743  GLUCAP 99 116* 94 106* 102*    Recent Results (from the past 240 hour(s))  MRSA PCR Screening     Status: None   Collection Time: 12/07/14  4:34 AM  Result Value Ref Range Status   MRSA by PCR NEGATIVE NEGATIVE Final    Comment:        The GeneXpert MRSA Assay (FDA approved for NASAL specimens only), is one component of a comprehensive MRSA colonization surveillance program. It is not intended to diagnose MRSA infection nor to guide or monitor treatment for MRSA infections.      Studies: No results found.  Scheduled Meds: . atorvastatin  40 mg Oral q1800  . digoxin  0.125 mg Oral Daily  . erythromycin  1 application Right Eye QHS  . furosemide  40 mg Oral Daily  . l-methylfolate-B6-B12  1 tablet Oral Daily  . levothyroxine  88 mcg Oral QAC breakfast  . losartan  50 mg Oral Daily  . metoprolol tartrate  25 mg Oral BID  . multivitamin with minerals  1 tablet Oral Daily  . mycophenolate  500 mg Oral Daily  . pantoprazole (PROTONIX) IV  40 mg Intravenous Q12H  . potassium chloride  40 mEq Oral BID  . sodium chloride  3 mL Intravenous Q12H  . tamsulosin  0.8 mg Oral Daily  . traMADol  50 mg Oral BID   Continuous Infusions:  Antibiotics Given (last 72 hours)    None      Principal Problem:   GIB (gastrointestinal bleeding) Active Problems:   Guaiac positive stools   Microcytic anemia   Arthritis   Hypertension   Hypothyroidism   Retinitis   Abdominal distention   Essential hypertension   A-fib (HCC)   Iron deficiency anemia due to chronic blood loss   Melena   Anemia, iron deficiency   Heme positive stool   Encounter for monitoring antiplatelet therapy    Time spent: 43min    Nasser Ku  [facility] Pager [phone]. If 7PM-7AM, please contact night-coverage at [email or web address], password [facility] 12/12/2014, 12:56 PM  LOS: 5 days

## 2014-12-12 NOTE — Progress Notes (Signed)
OT Cancellation Note  Patient Details Name: Jesse Burton MRN: [id number] DOB: 12/23/1925   Cancelled Treatment:    Reason Eval/Treat Not Completed: Other (comment)    colonoscopy scheduled for today. Will recheck on pt next day  [place], Mickel Baas, [place] [phone] 12/12/2014, 2:39 PM

## 2014-12-12 NOTE — Care Management Important Message (Signed)
Important Message  Patient Details  Name: Jesse Burton MRN: [id number] Date of Birth: 1925-10-29   Medicare Important Message Given:  Yes-second notification given    Camillo Flaming 12/12/2014, 11:02 AMImportant Message  Patient Details  Name: Jesse Burton MRN: [id number] Date of Birth: Mar 01, 1925   Medicare Important Message Given:  Yes-second notification given    Camillo Flaming 12/12/2014, 11:02 AM

## 2014-12-13 ENCOUNTER — Encounter (HOSPITAL_COMMUNITY): Payer: Self-pay | Admitting: Internal Medicine

## 2014-12-13 DIAGNOSIS — D500 Iron deficiency anemia secondary to blood loss (chronic): Secondary | ICD-10-CM

## 2014-12-13 DIAGNOSIS — D630 Anemia in neoplastic disease: Secondary | ICD-10-CM

## 2014-12-13 DIAGNOSIS — Z951 Presence of aortocoronary bypass graft: Secondary | ICD-10-CM

## 2014-12-13 DIAGNOSIS — K921 Melena: Secondary | ICD-10-CM

## 2014-12-13 DIAGNOSIS — K6389 Other specified diseases of intestine: Secondary | ICD-10-CM

## 2014-12-13 DIAGNOSIS — Z01810 Encounter for preprocedural cardiovascular examination: Secondary | ICD-10-CM

## 2014-12-13 DIAGNOSIS — I480 Paroxysmal atrial fibrillation: Secondary | ICD-10-CM

## 2014-12-13 LAB — GLUCOSE, CAPILLARY: Glucose-Capillary: 103 mg/dL — ABNORMAL HIGH (ref 65–99)

## 2014-12-13 LAB — CBC
HEMATOCRIT: 26.4 % — AB (ref 39.0–52.0)
Hemoglobin: 8.1 g/dL — ABNORMAL LOW (ref 13.0–17.0)
MCH: 24.5 pg — AB (ref 26.0–34.0)
MCHC: 30.7 g/dL (ref 30.0–36.0)
MCV: 79.8 fL (ref 78.0–100.0)
Platelets: 277 10*3/uL (ref 150–400)
RBC: 3.31 MIL/uL — ABNORMAL LOW (ref 4.22–5.81)
RDW: 18.2 % — AB (ref 11.5–15.5)
WBC: 5.8 10*3/uL (ref 4.0–10.5)

## 2014-12-13 LAB — BASIC METABOLIC PANEL
Anion gap: 7 (ref 5–15)
BUN: 16 mg/dL (ref 6–20)
CALCIUM: 8.9 mg/dL (ref 8.9–10.3)
CO2: 27 mmol/L (ref 22–32)
CREATININE: 1.05 mg/dL (ref 0.61–1.24)
Chloride: 105 mmol/L (ref 101–111)
GFR calc non Af Amer: >60 mL/min (ref >60)
Glucose, Bld: 97 mg/dL (ref 65–99)
Potassium: 4.4 mmol/L (ref 3.5–5.1)
Sodium: 139 mmol/L (ref 135–145)

## 2014-12-13 MED ORDER — AMIODARONE LOAD VIA INFUSION
150.0000 mg | Freq: Once | INTRAVENOUS | Status: AC
  Administered 2014-12-13: 150 mg via INTRAVENOUS
  Filled 2014-12-13: qty 83.34

## 2014-12-13 MED ORDER — SODIUM CHLORIDE 0.9 % IV SOLN
1000.0000 mg | Freq: Once | INTRAVENOUS | Status: AC
  Administered 2014-12-13: 1000 mg via INTRAVENOUS
  Filled 2014-12-13: qty 20

## 2014-12-13 MED ORDER — PANTOPRAZOLE SODIUM 40 MG PO TBEC: 40.0000 mg | DELAYED_RELEASE_TABLET | Freq: Every day | ORAL | Status: DC

## 2014-12-13 MED ORDER — AMIODARONE HCL IN DEXTROSE 360-4.14 MG/200ML-% IV SOLN
30.0000 mg/h | INTRAVENOUS | Status: DC
  Administered 2014-12-14 – 2014-12-19 (×12): 30 mg/h via INTRAVENOUS
  Filled 2014-12-13 (×17): qty 200

## 2014-12-13 MED ORDER — AMIODARONE HCL IN DEXTROSE 360-4.14 MG/200ML-% IV SOLN
60.0000 mg/h | INTRAVENOUS | Status: AC
  Administered 2014-12-13 (×2): 60 mg/h via INTRAVENOUS
  Filled 2014-12-13 (×2): qty 200

## 2014-12-13 MED ORDER — SODIUM CHLORIDE 0.9 % IV SOLN
25.0000 mg | Freq: Once | INTRAVENOUS | Status: AC
  Administered 2014-12-13: 25 mg via INTRAVENOUS
  Filled 2014-12-13: qty 0.5

## 2014-12-13 NOTE — Consult Note (Signed)
Reason for Consult:colon mass Referring Physician: Dr. Verlin Fester is an 81 y.o. male.  HPI: The patient is an 81yo wm who presents with some dark stools. He was found to have a Hg of 4.5. He underwent a colonoscopy yesterday that showed a nonobstructing mass in the distal transverse colon. This has the appearance of a cancer but biopsies are pending. He denies any abdominal pain. He has dementia and is a resident of a nursing home. He also has a significant cardiac history.  Past Medical History  Diagnosis Date  . Arthritis   . Thyroid disease   . Neuromuscular disorder ([place])   . Hypertension   . Hypothyroidism   . BPH (benign prostatic hyperplasia)   . Retinitis   . HLD (hyperlipidemia)   . CAD (coronary artery disease) of bypass graft     Past Surgical History  Procedure Laterality Date  . Coronary artery bypass graft    . Colonoscopy with propofol N/A 12/12/2014    Procedure: COLONOSCOPY WITH PROPOFOL;  Surgeon: Gatha Mayer, MD;  Location: [facility];  Service: Endoscopy;  Laterality: N/A;    Family History  Problem Relation Age of Onset  . Stroke Father     Social History:  reports that he has quit smoking. He does not have any smokeless tobacco history on file. He reports that he does not drink alcohol. His drug history is not on file.  Allergies:  Allergies  Allergen Reactions  . Sulfa Antibiotics Other (See Comments)    Patient states he does not recall an allergy to sulfa  . Ativan [Lorazepam] Anxiety    Medications: I have reviewed the patient's current medications.  Results for orders placed or performed during the hospital encounter of 12/07/14 (from the past 48 hour(s))  CBC     Status: Abnormal   Collection Time: 12/12/14  5:33 AM  Result Value Ref Range   WBC 4.8 4.0 - 10.5 K/uL   RBC 3.18 (L) 4.22 - 5.81 MIL/uL   Hemoglobin 7.7 (L) 13.0 - 17.0 g/dL   HCT 25.6 (L) 39.0 - 52.0 %   MCV 80.5 78.0 - 100.0 fL   MCH 24.2 (L) 26.0 - 34.0  pg   MCHC 30.1 30.0 - 36.0 g/dL   RDW 18.2 (H) 11.5 - 15.5 %   Platelets 253 150 - 400 K/uL  Basic metabolic panel     Status: Abnormal   Collection Time: 12/12/14  5:33 AM  Result Value Ref Range   Sodium 140 135 - 145 mmol/L   Potassium 4.4 3.5 - 5.1 mmol/L   Chloride 107 101 - 111 mmol/L   CO2 26 22 - 32 mmol/L   Glucose, Bld 94 65 - 99 mg/dL   BUN 21 (H) 6 - 20 mg/dL   Creatinine, Ser 1.00 0.61 - 1.24 mg/dL   Calcium 8.8 (L) 8.9 - 10.3 mg/dL   GFR calc non Af Amer >60 >60 mL/min   GFR calc Af Amer >60 >60 mL/min    Comment: (NOTE) The eGFR has been calculated using the CKD EPI equation. This calculation has not been validated in all clinical situations. eGFR's persistently <60 mL/min signify possible Chronic Kidney Disease.    Anion gap 7 5 - 15  Glucose, capillary     Status: Abnormal   Collection Time: 12/12/14  7:43 AM  Result Value Ref Range   Glucose-Capillary 102 (H) 65 - 99 mg/dL  CBC     Status: Abnormal  Collection Time: 12/13/14  5:05 AM  Result Value Ref Range   WBC 5.8 4.0 - 10.5 K/uL   RBC 3.31 (L) 4.22 - 5.81 MIL/uL   Hemoglobin 8.1 (L) 13.0 - 17.0 g/dL   HCT 26.4 (L) 39.0 - 52.0 %   MCV 79.8 78.0 - 100.0 fL   MCH 24.5 (L) 26.0 - 34.0 pg   MCHC 30.7 30.0 - 36.0 g/dL   RDW 18.2 (H) 11.5 - 15.5 %   Platelets 277 150 - 400 K/uL  Basic metabolic panel     Status: None   Collection Time: 12/13/14  5:05 AM  Result Value Ref Range   Sodium 139 135 - 145 mmol/L   Potassium 4.4 3.5 - 5.1 mmol/L   Chloride 105 101 - 111 mmol/L   CO2 27 22 - 32 mmol/L   Glucose, Bld 97 65 - 99 mg/dL   BUN 16 6 - 20 mg/dL   Creatinine, Ser 1.05 0.61 - 1.24 mg/dL   Calcium 8.9 8.9 - 10.3 mg/dL   GFR calc non Af Amer >60 >60 mL/min   GFR calc Af Amer >60 >60 mL/min    Comment: (NOTE) The eGFR has been calculated using the CKD EPI equation. This calculation has not been validated in all clinical situations. eGFR's persistently <60 mL/min signify possible Chronic  Kidney Disease.    Anion gap 7 5 - 15  Glucose, capillary     Status: Abnormal   Collection Time: 12/13/14  7:14 AM  Result Value Ref Range   Glucose-Capillary 103 (H) 65 - 99 mg/dL   Comment 1 Notify RN    Comment 2 Document in Chart     No results found.  Review of Systems  Constitutional: Negative.   HENT: Negative.   Eyes: Negative.   Respiratory: Negative.   Cardiovascular: Negative.   Gastrointestinal: Positive for diarrhea.  Genitourinary: Negative.   Musculoskeletal: Negative.   Skin: Negative.   Neurological: Negative.   Endo/Heme/Allergies: Negative.   Psychiatric/Behavioral: Negative.    Blood pressure 128/37, pulse 47, temperature 97.3 F (36.3 C), temperature source Oral, resp. rate 16, height 6' (1.829 m), weight 84.278 kg (185 lb 12.8 oz), SpO2 95 %. Physical Exam  Constitutional: He is oriented to person, place, and time. He appears well-developed and well-nourished.  HENT:  Head: Normocephalic and atraumatic.  Eyes: Conjunctivae and EOM are normal. Pupils are equal, round, and reactive to light.  Neck: Normal range of motion. Neck supple.  Cardiovascular: Normal rate, regular rhythm and normal heart sounds.   Respiratory: Effort normal and breath sounds normal.  GI: Soft. Bowel sounds are normal. He exhibits no distension. There is no tenderness.  Musculoskeletal: Normal range of motion.  Neurological: He is alert and oriented to person, place, and time.  Skin: Skin is warm and dry.  Psychiatric: He has a normal mood and affect. His behavior is normal.    Assessment/Plan: The patient appears to have a colon cancer that is nonobstructing in the distal transverse colon. No evidence of mets on CT. CEA pending. At this point given his cardiac history he should have cardiac clearance. He has seen Dr. Mare Ferrari in the past. He should also be evaluated by Oncology in case he is not a good candidate for surgery in case his disease could be controlled medically.  If he can have surgery then we would plan for partial colectomy during this admission. I have talked to his family in detail about the treatment and they are in  agreement  TOTH III,Benen Weida S 12/13/2014, 12:01 PM

## 2014-12-13 NOTE — Progress Notes (Addendum)
Occupational Therapy Treatment Patient Details Name: Jesse Burton MRN: [id number] DOB: 01/12/1926 Today's Date: 12/13/2014    History of present illness 81 y.o. male admitted with GI bleed, anemia. Hx of dementia, Afib, HTN. Pt is from [facility]-long term care.   OT comments  Pt pleasant and daughter present in session. Continue to recommend SNF.  Follow Up Recommendations  SNF    Equipment Recommendations  None recommended by OT    Recommendations for Other Services      Precautions / Restrictions Precautions Precautions: Fall Restrictions Weight Bearing Restrictions: No       Mobility Bed Mobility Overal bed mobility: Needs Assistance Bed Mobility: Supine to Sit; Sit to Supine     Supine to sit: Min assist  Sit to Supine: Supervision   General bed mobility comments: assist with trunk-decreased balance.  Transfers Overall transfer level: Needs assistance Equipment used: Rolling walker (2 wheeled) Transfers: Sit to/from Stand Sit to Stand: Mod assist         General transfer comment: assist to rise and stabilize.    Balance    Assist for balance with sit to stand from bed. Used RW for ambulation.                               ADL Overall ADL's : Needs assistance/impaired  Eating/Feeding: See ADL comments below.   Grooming: Standing;Oral care;Applying deodorant Grooming Details (indicate cue type and reason): Min guard with exception of OT assisting in moving gown out of the way for pt to apply deodorant.                 Toilet Transfer: Ambulation;RW;Moderate assistance (sit to stand from bed-Mod assist; Min assist-ambulation)           Functional mobility during ADLs: Minimal assistance;Rolling walker General ADL Comments: Pt able to drink water out of basin after brushing teeth with Min guard assist- while standing (no spillage).      Vision                     Perception     Praxis      Cognition   Awake/Alert Behavior During Therapy: WFL for tasks assessed/performed Overall Cognitive Status: History of cognitive impairments - at baseline                       Extremity/Trunk Assessment               Exercises     Shoulder Instructions       General Comments      Pertinent Vitals/ Pain       Pain Assessment: Faces Faces Pain Scale: Hurts little more Pain Location: penis Pain Intervention(s):  (notified nurse)  Home Living                                          Prior Functioning/Environment              Frequency Min 2X/week     Progress Toward Goals  OT Goals(current goals can now be found in the care plan section)  Progress towards OT goals: Progressing toward goals-added a goal  Acute Rehab OT Goals Patient Stated Goal: none stated OT Goal Formulation: With patient/family Time For Goal Achievement: 12/23/14 Potential to  Achieve Goals: Good ADL Goals Pt Will Perform Eating: with modified independence;sitting Pt Will Perform Grooming: with set-up;with supervision;standing (3 tasks) Pt Will Transfer to Toilet: with min guard assist;ambulating;regular height toilet;bedside commode;grab bars Pt Will Perform Toileting - Clothing Manipulation and hygiene: with min guard assist;sit to/from stand  Plan Discharge plan remains appropriate    Co-evaluation                 End of Session Equipment Utilized During Treatment: Rolling walker   Activity Tolerance  (reported he was tired)   Patient Left in bed;with call bell/phone within reach;with family/visitor present;with bed alarm set   Nurse Communication Mobility status (talked with tech; Notified nurse of pt's pain)        Time: [phone] (also in room from [phone] and left to get RW, and while gone, MD came in room to speak to pt) OT Time Calculation (min): 19 min  Charges: OT General Charges $OT Visit: 1 Procedure OT Treatments $Self Care/Home Management  : 8-22 mins  Benito Mccreedy OTR/L [phone] 12/13/2014, 4:15 PM

## 2014-12-13 NOTE — Progress Notes (Signed)
MEDICATION RELATED CONSULT NOTE - INITIAL   Pharmacy Consult for Iron Dextran Indication: Iron Replacement  Allergies  Allergen Reactions  . Sulfa Antibiotics Other (See Comments)    Patient states he does not recall an allergy to sulfa  . Ativan [Lorazepam] Anxiety    Patient Measurements: Height: 6' (182.9 cm) Weight: 185 lb 12.8 oz (84.278 kg) IBW/kg (Calculated) : 77.6  Vital Signs: Temp: 98.3 F (36.8 C) (10/25 1605) Temp Source: Oral (10/25 1605) BP: 114/63 mmHg (10/25 1641) Pulse Rate: 68 (10/25 1641) Intake/Output from previous day: 10/24 0701 - 10/25 0700 In: 600 [I.V.:600] Out: 1700 [Urine:1700] Intake/Output from this shift: Total I/O In: 840 [P.O.:840] Out: 500 [Urine:500]  Labs:  Recent Labs  12/11/14 0510 12/12/14 0533 12/13/14 0505  WBC 6.0 4.8 5.8  HGB 7.8* 7.7* 8.1*  HCT 25.5* 25.6* 26.4*  PLT 276 253 277  CREATININE 1.22 1.00 1.05   Estimated Creatinine Clearance: 54.4 mL/min (by C-G formula based on Cr of 1.05).   Microbiology: Recent Results (from the past 720 hour(s))  MRSA PCR Screening     Status: None   Collection Time: 12/07/14  4:34 AM  Result Value Ref Range Status   MRSA by PCR NEGATIVE NEGATIVE Final    Comment:        The GeneXpert MRSA Assay (FDA approved for NASAL specimens only), is one component of a comprehensive MRSA colonization surveillance program. It is not intended to diagnose MRSA infection nor to guide or monitor treatment for MRSA infections.     Medical History: Past Medical History  Diagnosis Date  . Arthritis   . Thyroid disease   . Neuromuscular disorder ([place])   . Hypertension   . Hypothyroidism   . BPH (benign prostatic hyperplasia)   . Retinitis   . HLD (hyperlipidemia)   . CAD (coronary artery disease) of bypass graft      Assessment: 89 yoM with iron deficiency anemia, colon mass.  Pharmacy consulted for iron replacement with iron dextran.  Pt's Hgb 4.9 on admission which improved to  7.4 after 2uPRBC on 10/19.    Hgb 8.1 today Iron Panel: iron 7, tsat 2%, TIBC 406, ferritin 6  Goal of Therapy:  Iron Replacement  Plan:  1.  Iron dextran 25 mg IV test dose.  Monitor patient for 1 hour. 2.  Iron dextran 1000 mg IV once over 5 hours if patient tolerates test dose.  Hershal Coria 12/13/2014,5:15 PM

## 2014-12-13 NOTE — Consult Note (Signed)
CARDIOLOGY CONSULT NOTE   Patient ID: Jesse Burton MRN: [id number], DOB/AGE: 09-29-1925   Admit date: 12/07/2014 Date of Consult: 12/13/2014 Reason for  Consult: Preoperative Clearance  Primary Physician: No primary care provider on file. Primary Cardiologist: Dr. Mare Ferrari - 10+ years ago; Now Dr. Johnsie Cancel  HPI: Jesse Burton is a 81 y.o. male with past medical history of HTN, HLD, Hypothyroidism, CAD (s/p CABG 1991), Paroxysmal Atrial Fibrillation (not on anticoagulation), and dementia (with sundowning) who presented to [place] ED on 12/07/2014 with a hemoglobin of 4.5. Reported having dark and black tarry stools recently.  Received 2 units of PRBC's with hemoglobin currently being 8.1 on 12/13/2014. Underwent EGD and colonoscopy on 12/12/2014 for further evaluation of his iron deficiency anemia and heme positive stools. Colonoscopy showed a non-obstructing mass in the transverse colon with further pathology pending, likely to represent colon cancer. Surgery has seen and evaluated the patient for potential partial colectomy.   The patient denies any recent chest pain, palpitations, edema, shortness of breath, orthopnea, dizziness, headaches, or syncope.   The patient's daughter is present throughout examination and reports he has not complained of any of the above symptoms recently. He currently resides at [facility] and ambulates with a walker. Reports he has "memory issues" at baseline but his mental status has been acutely worse since admitted.  His last cardiac procedure was CABG in 1991 and reports no further cardiac workup since. The patient and daughter are not aware of any stents being placed in the past, but report he has been on Plavix since the procedure in 1991. Has had known atrial fibrillation for years and patient's daughter is unsure of why he has not been on anticoagulation. She reports he fell one time in 2014, but no subsequent falls since.   Problem  List Past Medical History  Diagnosis Date  . Arthritis   . Thyroid disease   . Neuromuscular disorder ([place])   . Hypertension   . Hypothyroidism   . BPH (benign prostatic hyperplasia)   . Retinitis   . HLD (hyperlipidemia)   . CAD (coronary artery disease) of bypass graft     Past Surgical History  Procedure Laterality Date  . Coronary artery bypass graft    . Colonoscopy with propofol N/A 12/12/2014    Procedure: COLONOSCOPY WITH PROPOFOL;  Surgeon: Gatha Mayer, MD;  Location: [facility];  Service: Endoscopy;  Laterality: N/A;     Allergies Allergies  Allergen Reactions  . Sulfa Antibiotics Other (See Comments)    Patient states he does not recall an allergy to sulfa  . Ativan [Lorazepam] Anxiety      Inpatient Medications  . atorvastatin  40 mg Oral q1800  . digoxin  0.125 mg Oral Daily  . erythromycin  1 application Right Eye QHS  . furosemide  40 mg Oral Daily  . l-methylfolate-B6-B12  1 tablet Oral Daily  . levothyroxine  88 mcg Oral QAC breakfast  . losartan  50 mg Oral Daily  . metoprolol tartrate  25 mg Oral BID  . multivitamin with minerals  1 tablet Oral Daily  . mycophenolate  500 mg Oral Daily  . pantoprazole (PROTONIX) IV  40 mg Intravenous Q12H  . potassium chloride  40 mEq Oral Daily  . sodium chloride  3 mL Intravenous Q12H  . tamsulosin  0.8 mg Oral Daily  . traMADol  50 mg Oral BID    Family History Family History  Problem Relation Age of  Onset  . Stroke Father      Social History Social History   Social History  . Marital Status: Widowed    Spouse Name: N/A  . Number of Children: N/A  . Years of Education: N/A   Occupational History  . Not on file.   Social History Main Topics  . Smoking status: Former Research scientist (life sciences)  . Smokeless tobacco: Not on file  . Alcohol Use: No  . Drug Use: Not on file  . Sexual Activity: Not on file   Other Topics Concern  . Not on file   Social History Narrative     Review of Systems General:   No chills, fever, night sweats or weight changes. Positive for fatigue. Cardiovascular:  No chest pain, dyspnea on exertion, edema, orthopnea, palpitations, paroxysmal nocturnal dyspnea. Dermatological: No rash, lesions/masses Respiratory: No cough, dyspnea Urologic: No hematuria, dysuria Abdominal:   No nausea, vomiting, diarrhea, bright red blood per rectum. Positive for dark tarry stools. Neurologic:  No visual changes, wkns, changes in mental status. All other systems reviewed and are otherwise negative except as noted above.  Physical Exam Blood pressure 128/37, pulse 47, temperature 97.3 F (36.3 C), temperature source Oral, resp. rate 16, height 6' (1.829 m), weight 185 lb 12.8 oz (84.278 kg), SpO2 95 %.  General: Pleasant, elderly male in NAD Psych: Normal affect. Neuro: Alert and oriented X 3. Moves all extremities spontaneously. HEENT: Normal  Neck: Supple without bruits or JVD. Lungs:  Resp regular and unlabored, CTA without wheezing or rales. Heart: Irregularly irregular,  no s3, s4, or murmurs. Abdomen: Soft, non-tender, non-distended, BS + x 4.  Extremities: No clubbing, cyanosis or edema. DP/PT/Radials 2+ and equal bilaterally.  Labs No results for input(s): CKTOTAL, CKMB, TROPONINI in the last 72 hours. Lab Results  Component Value Date   WBC 5.8 12/13/2014   HGB 8.1* 12/13/2014   HCT 26.4* 12/13/2014   MCV 79.8 12/13/2014   PLT 277 12/13/2014    Recent Labs Lab 12/07/14 0144  12/13/14 0505  NA 140  < > 139  K 4.0  < > 4.4  CL 110  < > 105  CO2 25  < > 27  BUN 32*  < > 16  CREATININE 1.12  < > 1.05  CALCIUM 8.6*  < > 8.9  PROT 5.8*  --   --   BILITOT 0.9  --   --   ALKPHOS 79  --   --   ALT 20  --   --   AST 21  --   --   GLUCOSE 111*  < > 97  < > = values in this interval not displayed. Lab Results  Component Value Date   CHOL 93 12/07/2014   HDL 36* 12/07/2014   LDLCALC 47 12/07/2014   TRIG 51 12/07/2014    Radiology/Studies Ct Abdomen  Pelvis W Contrast: 12/07/2014  CLINICAL DATA:  Dark stools and abdominal distention. EXAM: CT ABDOMEN AND PELVIS WITH CONTRAST TECHNIQUE: Multidetector CT imaging of the abdomen and pelvis was performed using the standard protocol following bolus administration of intravenous contrast. CONTRAST:  127mL OMNIPAQUE IOHEXOL 300 MG/ML  SOLN COMPARISON:  None. FINDINGS: Normal hepatic contour. The examination is minimally degraded secondary exclusion of the dome of the diaphragm. No discrete hepatic lesions. There are multiple layering laminated gallstones seen throughout the gallbladder. This finding is associated with a potential very minimal amount of gallbladder wall thickening and pericholecystic fluid though this may be accentuated due to a  minimal amount of third spacing within the abdomen. No definite intra or extrahepatic bili duct dilatation. No ascites. There is symmetric enhancement and excretion of the bilateral kidneys. No definite renal stones in this postcontrast examination. No discrete renal lesions. There is a minimal amount of bilateral perinephric stranding, likely age and body habitus related. No urinary obstruction. Normal appearance of the bilateral adrenal glands, pancreas and spleen. Ingested enteric contrast extends to the level of the distal transverse colon. Moderate colonic stool burden without evidence of enteric obstruction. The bowel is otherwise normal in course and caliber without wall thickening. Normal appearance of the terminal ileum. There is is a minimal amount of stranding throughout the root of the abdominal mesenteric including the right lower abdominal quadrant surrounding the appendix without focality and favored to be secondary to third spacing/volume overload. Otherwise, normal appearance of the appendix. Moderate amount of mixed calcified and noncalcified atherosclerotic plaque within a normal caliber abdominal aorta. The major branch vessels of the abdominal aorta appear  patent on this non CTA examination. 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 No bulky retroperitoneal, mesenteric, pelvic or inguinal lymphadenopathy. The prostate is borderline enlarged with mass effect on the undersurface urinary bladder. Otherwise normal appearance of the urinary bladder given degree distention. There is a small amount of fluid seen within the pelvic cul-de-sac. Limited visualization of the lower thorax is degraded secondary to patient respiratory artifact. Trace bilateral pleural effusions, right greater than left, with associated dependent subpleural opacities, likely atelectasis. Cardiomegaly. Coronary artery calcifications. No pericardial effusion. No acute or aggressive osseous abnormalities. Mild-to-moderate multilevel lumbar spine DDD, worse at L2-L3 with disc space height loss, endplate irregularity and sclerosis. Mild diffuse body wall anasarca. Regional soft tissues appear otherwise normal. IMPRESSION: 1. Cardiomegaly with findings worrisome for pulmonary edema including trace bilateral effusions, right greater than left, mild diffuse body wall anasarca and minimal amount of mesenteric stranding / third-spacing throughout the peritoneum. Clinical correlation is advised. 2. Cholelithiasis with potential minimal amount of gallbladder wall thickening and pericholecystic fluid, again, potentially attributable to suspected third-spacing within the peritoneal cavity - if clinical concern persists for acute cholecystitis, further evaluation with gallbladder ultrasound and/or nuclear medicine HIDA scan could be performed as clinically indicated. 3. Coronary artery calcifications. 4. Prostatomegaly. Electronically Signed   By: Sandi Mariscal M.D.   On: 12/07/2014 11:43   EKG: Atrial fibrillation with rate in 70's.   ECHOCARDIOGRAM: 12/09/2014 Study Conclusions - Left ventricle: The cavity size was normal. There was  moderate concentric hypertrophy. Systolic function was normal. The estimated ejection fraction was in the range of 60% to 65%. Wall motion was normal; there were no regional wall motion abnormalities. - Mitral valve: There was mild regurgitation. - Left atrium: The atrium was moderately to severely dilated. Anterior-posterior dimension: 49 mm.  ASSESSMENT AND PLAN:  1. Preoperative Risk Assessment for Partial Colectomy - presented with hemoglobin of 4.5 and history of dark tarry stools. Colonoscopy performed on 12/12/2014 showed non-obstructing mass in the transverse colon with further pathology pending, likely to represent colon cancer.  - patient is of moderate risk from a cardiac perspective for the procedure mentioned above.   2. Chronic Atrial Fibrillation -This patients CHA2DS2-VASc Score and unadjusted Ischemic Stroke Rate (% per year) is equal to 4.8 % stroke rate/year from a score of 4 (HTN, MI, Age > 75x2). Was not on anticoagulation prior to admission. Would not be candidate currently due to GI  bleed. - patient has been noted to have NSR at times and atrial fibrillation with rates in 130's at time. Will start on IV Amiodarone for better control of HR and rhythm prior to procedure. Can be switched to PO after the procedure and once tolerating PO medications. Digoxin discontinued.  - continue Metoprolol for rate control as HR will allow.  3. History of CAD - s/p CABG 1991, with no subsequent interventions since. - Plavix has been held in setting of GI Bleed.   Signed, Erma Heritage, PA-C 12/13/2014, 12:04 PM Pager: [phone]  Patient examined chart reviewed. Discussed care with daughter as patient has severe dementia Exam with frail elderly male mild typmpany of abdomen.  Distant history of CAD/CABG with no angina Despite severe anemia.  Transverse colon lesion likely cancer with also ? chyolycystitis and 3rd spacing Abdominal fluid.  Having PAF with  occasional high rates.  No anticoagulation due to GI bleed.  Continue beta Blocker for rate control. Add iv amiodarone to try to maintain NSR.  Clear to have surgery to prevent sepsis And or bowel obstruction and recurrent GI bleed.  Will follow on telemetry.    Jenkins Rouge

## 2014-12-13 NOTE — Progress Notes (Addendum)
     [facility] Progress Note  Subjective:  Feels fine.  According to daughter, surgeons have already been in.    Objective:  Vital signs in last 24 hours: Temp:  [97.3 F (36.3 C)-98.1 F (36.7 C)] 97.3 F (36.3 C) (10/25 0558) Pulse Rate:  [47-67] 47 (10/25 0558) Resp:  [12-20] 16 (10/25 0558) BP: (96-132)/(37-70) 128/37 mmHg (10/25 0558) SpO2:  [92 %-98 %] 95 % (10/25 0558) Weight:  [180 lb (81.647 kg)-185 lb 12.8 oz (84.278 kg)] 185 lb 12.8 oz (84.278 kg) (10/25 0559) Last BM Date: 12/12/14 General: Alert, Well-developed, in NAD Heart:  Bradycardic. Pulm:  CTAB.  No W/R/R. Abdomen:  Soft, non-distended. Normal bowel sounds.  Non-tender.  Extremities:  Without edema.  Assessment / Plan: #81 yo malewith profound iron deficiency anemia /heme + stool in setting of chronic Plavix use.  HGB 4.9 on admit but improved to 8.1 grams currently after blood transfusions. #2 Transverse colon cancer:  Seen on colonoscopy 10/24.  Pathology pending.  Ordered CEA this AM.  Already seen by surgery (no note is system yet though) who is planning for cardiology evaluation/clearance.  Oncology to see as well. #3 Dementia #4 CAD s/p CABG:  Previously on Plavix. #5 hx AFIB  *No reason that patient can't have a diet from GI standpoint, but will defer to surgery and hospitalist in case there is any plan for surgery, etc.   LOS: 6 days   ZEHR, JESSICA D.  12/13/2014, 9:40 AM  Pager number [phone]  [place] Attending  I have also seen and assessed the patient and agree with the advanced practitioner's assessment and plan. Surgery and oncology have seen patient re: colon cancer (path +). Oncology does not think chemoTx has a role. Dr. Marlou Starks is willing to operate and cardiology has cleared.  We will be available if needed. Would not anticipate placing this man in a surveillance colonoscopy protocol.  I do think IV iron Tx makes sense as per oncology PA. Will order.  Gatha Mayer, MD, [facility] [phone] (pager) 12/13/2014 5:09 PM

## 2014-12-13 NOTE — Consult Note (Signed)
[place]  [phone]   HEMATOLOGY ONCOLOGY CONSULTATION   Jesse Burton  DOB: April 30, 1925  MR#: [id number]  CSN#: [id number]    Requesting Physician: [facility]    No care team member to display  Reason for consult: Colonic Mass      HPI:   81 y.o.  male admitted on 10/24 from his nursing home for evaluation of 1 month history of  dark stools, fatigue,  and severe, symptomatic anemia, with a Hb 4.5 on presentation, requiring 3 units of blood with good response. History is obtained by daughter, due to patient's dementia. Denies fevers, chills, night sweats, vision changes, or mucositis. Denies any respiratory complaints. Denies any chest pain or palpitations. Denies lower extremity swelling. Denies nausea, heartburn but did report feeling full. Appetite is normal, with possible increase in weight per daughter. Denies any dysuria. Denies abnormal skin rashes, or neuropathy Denies any bleeding issues such as epistaxis, hemoptysis, hematemesis, hematuria.  He was  ambulating without difficulty until admission.  CT chest, abdomen and pelvis on 10/19 prior to admission showed minimal amount of mesenteric stranding / third-spacing throughout the peritoneum. And possible cholecystitis, but no discrete mass was seen worrisome for cancer.  He was also found to have Iron deficiency and Guaiac was positive as outpatient He underwent EGD/ colonoscopy by Dr. Carlean Purl on 10/24 revealing a non-obstructing, large mass in the distal transverse colon worrisome for malignancy. Pathology is consistent with invasive adenocarcinoma ([id number]). CEA is pending  No family history of colon cancer. Last colonoscopy prior to this admission was done greater than 10 years ago, negative per daughter's report. Denies risk factors for HIV or hepatitis. Denies Diabetes. DeniesTobacco. Denies ETOH. Denies prior radiation. His diet did not include good amounts of fiber. He worked in the [employer] until retirement.  Surgical evaluation is pending, and cardiac clearance was obtained.  We were kindly requested to see the patient with recommendations.    Past medical history:      Past Medical History  Diagnosis Date  . Arthritis   . Thyroid disease   . Neuromuscular disorder ([place])   . Hypertension   . Hypothyroidism   . BPH (benign prostatic hyperplasia)   . Retinitis   . HLD (hyperlipidemia)   . CAD (coronary artery disease) of bypass graft     Past surgical history:      Past Surgical History  Procedure Laterality Date  . Coronary artery bypass graft  1991  . Colonoscopy with propofol N/A 12/12/2014    Procedure: COLONOSCOPY WITH PROPOFOL;  Surgeon: Gatha Mayer, MD;  Location: [facility];  Service: Endoscopy;  Laterality: N/A;    Medications:  Prior to Admission:  Prescriptions prior to admission  Medication Sig Dispense Refill Last Dose  . acetaminophen (TYLENOL) 325 MG tablet Take 650 mg by mouth daily.   12/06/2014 at Unknown time  . acetaminophen (TYLENOL) 325 MG tablet Take 650 mg by mouth every 6 (six) hours as needed (for mild pain.).   unknown  . atorvastatin (LIPITOR) 40 MG tablet Take 40 mg by mouth daily.   12/06/2014 at Unknown time  . clopidogrel (PLAVIX) 75 MG tablet Take 75 mg by mouth daily.   12/06/2014 at Unknown time  . digoxin (LANOXIN) 0.125 MG tablet Take 0.125 mg by mouth daily.   12/06/2014 at Unknown time  . erythromycin ophthalmic ointment Place 1 application into the right eye at bedtime.   12/06/2014 at Unknown time  .  furosemide (LASIX) 20 MG tablet Take 20 mg by mouth every other day.   12/06/2014 at Unknown time  . furosemide (LASIX) 40 MG tablet Take 40 mg by mouth every other day.   12/05/2014  . Glucosamine-Chondroitin 750-600 MG TABS Take 2 tablets by mouth daily.   12/06/2014 at Unknown time  . L-Methylfolate-B12-B6-B2 (CEREFOLIN) 07-19-48-5 MG TABS Take 1 tablet by mouth daily.   12/06/2014 at Unknown time  .  l-methylfolate-B6-B12 (METANX) 3-35-2 MG TABS tablet Take 1 tablet by mouth daily.   12/06/2014 at Unknown time  . levothyroxine (SYNTHROID, LEVOTHROID) 88 MCG tablet Take 88 mcg by mouth daily before breakfast.   12/06/2014 at Unknown time  . losartan (COZAAR) 50 MG tablet Take 50 mg by mouth daily.   12/06/2014 at Unknown time  . Multiple Vitamin (MULTIVITAMIN WITH MINERALS) TABS tablet Take 1 tablet by mouth daily. CERTA-VITE SENIOR   94/76/5465 at Unknown time  . mycophenolate (CELLCEPT) 500 MG tablet Take 500 mg by mouth daily.   12/06/2014 at Unknown time  . polyethylene glycol (MIRALAX / GLYCOLAX) packet Take 17 g by mouth daily.   12/06/2014 at Unknown time  . promethazine (PHENERGAN) 25 MG tablet Take 25 mg by mouth every 4 (four) hours as needed for nausea or vomiting.   11/28/2014  . tamsulosin (FLOMAX) 0.4 MG CAPS capsule Take 0.8 mg by mouth daily.   12/06/2014 at Unknown time  . traMADol (ULTRAM) 50 MG tablet Take 50 mg by mouth 2 (two) times daily.   12/06/2014 at Unknown time    KPT:[id number], ondansetron **OR** ondansetron (ZOFRAN) IV, polyethylene glycol, promethazine  Allergies:  Allergies  Allergen Reactions  . Sulfa Antibiotics Other (See Comments)    Patient states he does not recall an allergy to sulfa  . Ativan [Lorazepam] Anxiety    Family history:     Family History  Problem Relation Age of Onset  . Stroke Father                                             Social history: Social History   Social History  . Marital Status: Widowed    Spouse Name: N/A  . Number of Children: N/A  . Years of Education: N/A   Occupational History  . Not on file.   Social History Main Topics  . Smoking status: Former Research scientist (life sciences)  . Smokeless tobacco: Not on file  . Alcohol Use: No  . Drug Use: Not on file  . Sexual Activity: Not on file   Other Topics Concern  . Not on file   Social History Narrative    ROS: Constitutional: Denies fevers, chills or abnormal  night sweats Eyes: Denies blurriness of vision, double vision or watery eyes Ears, nose, mouth, throat, and face: Denies mucositis or sore throat Respiratory: Denies cough, dyspnea or wheezes Cardiovascular: Denies palpitation, chest discomfort or lower extremity swelling Gastrointestinal:  Denies nausea, heartburn or change in bowel habits Skin: Denies abnormal skin rashes Lymphatics: Denies new lymphadenopathy or easy bruising Neurological:Denies numbness, tingling or new weaknesses Behavioral/Psych: Mood is stable, no new changes  All other systems were reviewed with the patient and are negative.   Physical Exam    ECOG PERFORMANCE STATUS:  Filed Vitals:   12/13/14 1505  BP: 118/58  Pulse: 62  Temp:   Resp:    Filed Weights   12/12/14 0510  12/12/14 1310 12/13/14 0559  Weight: 190 lb 11.2 oz (86.5 kg) 180 lb (81.647 kg) 185 lb 12.8 oz (84.278 kg)    GENERAL:alert, no distress and comfortable SKIN: skin color, texture, turgor are normal, no rashes or significant lesions EYES: normal, conjunctiva are pink and non-injected, sclera clear OROPHARYNX:no exudate, no erythema and lips, buccal mucosa, and tongue normal  NECK: supple, thyroid normal size, non-tender, without nodularity LYMPH:  no palpable lymphadenopathy in the cervical, axillary or inguinal LUNGS: clear to auscultation and percussion with normal breathing effort HEART: regular rate & rhythm and no murmurs and no lower extremity edema ABDOMEN soft, non-tender and normal bowel sounds Musculoskeletal:no cyanosis of digits and no clubbing  PSYCH: alert & oriented x 3 with fluent speech NEURO: no focal motor/sensory deficits   Lab results:       CBC  Recent Labs Lab 12/07/14 0144  12/09/14 0535 12/10/14 0555 12/11/14 0510 12/12/14 0533 12/13/14 0505  WBC 4.3  < > 5.8 6.6 6.0 4.8 5.8  HGB 4.9*  < > 7.6* 7.8* 7.8* 7.7* 8.1*  HCT 16.5*  < > 24.2* 25.5* 25.5* 25.6* 26.4*  PLT 273  < > 256 275 276 253 277    MCV 77.5*  < > 78.3 78.5 79.4 80.5 79.8  MCH 23.0*  < > 24.6* 24.0* 24.3* 24.2* 24.5*  MCHC 29.7*  < > 31.4 30.6 30.6 30.1 30.7  RDW 16.7*  < > 16.9* 16.8* 17.9* 18.2* 18.2*  LYMPHSABS 1.0  --   --   --   --   --   --   MONOABS 0.5  --   --   --   --   --   --   EOSABS 0.1  --   --   --   --   --   --   BASOSABS 0.0  --   --   --   --   --   --   < > = values in this interval not displayed.  Anemia panel:  No results for input(s): VITAMINB12, FOLATE, FERRITIN, TIBC, IRON, RETICCTPCT in the last 72 hours.   Chemistries   Recent Labs Lab 12/09/14 0535 12/10/14 0555 12/11/14 0510 12/12/14 0533 12/13/14 0505  NA 139 140 137 140 139  K 3.4* 4.0 4.3 4.4 4.4  CL 105 107 107 107 105  CO2 25 28 25 26 27   GLUCOSE 94 103* 101* 94 97  BUN 18 25* 26* 21* 16  CREATININE 1.13 1.08 1.22 1.00 1.05  CALCIUM 8.5* 8.7* 8.8* 8.8* 8.9     Coagulation profile  Recent Labs Lab 12/07/14 0144  INR 1.26      Studies:      Ct Abdomen Pelvis W Contrast  12/07/2014  CLINICAL DATA:  Dark stools and abdominal distention. EXAM: CT ABDOMEN AND PELVIS WITH CONTRAST TECHNIQUE: Multidetector CT imaging of the abdomen and pelvis was performed using the standard protocol following bolus administration of intravenous contrast. CONTRAST:  156mL OMNIPAQUE IOHEXOL 300 MG/ML  SOLN COMPARISON:  None. FINDINGS: Normal hepatic contour. The examination is minimally degraded secondary exclusion of the dome of the diaphragm. No discrete hepatic lesions. There are multiple layering laminated gallstones seen throughout the gallbladder. This finding is associated with a potential very minimal amount of gallbladder wall thickening and pericholecystic fluid though this may be accentuated due to a minimal amount of third spacing within the abdomen. No definite intra or extrahepatic bili duct dilatation. No ascites. There is symmetric enhancement and  excretion of the bilateral kidneys. No definite renal stones in this  postcontrast examination. No discrete renal lesions. There is a minimal amount of bilateral perinephric stranding, likely age and body habitus related. No urinary obstruction. Normal appearance of the bilateral adrenal glands, pancreas and spleen. Ingested enteric contrast extends to the level of the distal transverse colon. Moderate colonic stool burden without evidence of enteric obstruction. The bowel is otherwise normal in course and caliber without wall thickening. Normal appearance of the terminal ileum. There is is a minimal amount of stranding throughout the root of the abdominal mesenteric including the right lower abdominal quadrant surrounding the appendix without focality and favored to be secondary to third spacing/volume overload. Otherwise, normal appearance of the appendix. Moderate amount of mixed calcified and noncalcified atherosclerotic plaque within a normal caliber abdominal aorta. The major branch vessels of the abdominal aorta appear patent on this non CTA examination. 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 No bulky retroperitoneal, mesenteric, pelvic or inguinal lymphadenopathy. The prostate is borderline enlarged with mass effect on the undersurface urinary bladder. Otherwise normal appearance of the urinary bladder given degree distention. There is a small amount of fluid seen within the pelvic cul-de-sac. Limited visualization of the lower thorax is degraded secondary to patient respiratory artifact. Trace bilateral pleural effusions, right greater than left, with associated dependent subpleural opacities, likely atelectasis. Cardiomegaly. Coronary artery calcifications. No pericardial effusion. No acute or aggressive osseous abnormalities. Mild-to-moderate multilevel lumbar spine DDD, worse at L2-L3 with disc space height loss, endplate irregularity and sclerosis. Mild diffuse body wall anasarca. Regional soft tissues  appear otherwise normal. IMPRESSION: 1. Cardiomegaly with findings worrisome for pulmonary edema including trace bilateral effusions, right greater than left, mild diffuse body wall anasarca and minimal amount of mesenteric stranding / third-spacing throughout the peritoneum. Clinical correlation is advised. 2. Cholelithiasis with potential minimal amount of gallbladder wall thickening and pericholecystic fluid, again, potentially attributable to suspected third-spacing within the peritoneal cavity - if clinical concern persists for acute cholecystitis, further evaluation with gallbladder ultrasound and/or nuclear medicine HIDA scan could be performed as clinically indicated. 3. Coronary artery calcifications. 4. Prostatomegaly. Electronically Signed   By: Sandi Mariscal M.D.   On: 12/07/2014 11:43    Assessment/Plan:81 y.o. male  with   Invasive Adenocarcinoma Colonoscopy revealed a non obstructing mass  Path report is consistent with  Invasive adenocarcinoma Awaiting Surgery input, to determine if patient is a candidate for surgical resection based on comorbidities and age. Will follow   Anemia in neoplastic disease Due blood loss, iron deficiency, malignancy Received 3 units on admission for Hb 4.5, with good response, currently at 8.1 No bleeding issues noted No transfusion is indicated at this time Transfuse blood to maintain a Hb of 8 g or if the patient is acutely bleeding May need Iron IV, as ferritin is 6, Iron 7 and TIBC 406, 2% sat Will monitor    DVT prophylaxis On SCD's   Full Code Other medical issues, including CAD, A fib, hyperlipidemia, BPH, dementia as per primary and specialties  [facility] E, PA-C 12/13/2014   Patient seen  and examined personally today please see note above for details. A pleasant 81 year old gentleman presented with microcytic anemia and found to have a nonobstructive colonic mass. Imaging studies did not show any evidence of disease beyond the  colon or evidence of metastasis. He is status post colonoscopy and a biopsy which confirmed the presence of  adenocarcinoma. Clinically he is not reporting any symptoms at this time does not report any abdominal pain or discomfort.  On physical examination, awake alert gentleman without any distress. Heart is regular rate and rhythm, lungs are clear abdomen is soft nontender without any hepatosplenomegaly. Extremities is no edema.  Laboratory data were reviewed and showed his hemoglobin have improved with transfusions.  Impression and plan: 81 year old gentleman with new diagnosis of colon cancer after presenting with a nonobstructive mass that is biopsy proven to confirm that diagnosis. CT scan images reviewed personally did not show any evidence of metastatic disease.  These findings were discussed with the patient and his daughter that was present today. At this time I feel primary surgical option is his only treatment treatment option moving forward. There is no role for systemic chemotherapy or radiation therapy.  His options are to proceed with surgery or proceed with hospice and palliative care only. Without surgery, this tumor will continue to grow and potentially cause obstruction and further bleeding down the line. If he can be cleared to have surgery I will be the recommendation at this point.  Please call with questions regarding this pleasant gentleman.  [facility] MD 12/13/2014

## 2014-12-13 NOTE — Progress Notes (Signed)
[facility] PROGRESS NOTE  Amarrion Pastorino TIR:[id number] DOB: [birth year] DOA: 12/07/2014 PCP: No primary care provider on file.   Narrative: Jesse Burton is a 81 y.o. male with PMH of Dementia, hypertension, hyperlipidemia, hypothyroidism, arthritis, CAD/CABG, retinitis, BPH, atrial fibrillation (not on anticoagulation), who presented with low hemoglobin. He was brought in ED after he was found to have low Hgb of 4.5 in [facility]. Since admission, seen by GI, plavix was stopped and received 3 units PRBC since then his Hb has been stable in the 7.8 range, underwent Colonoscopy 10/24-transverse colon mass 10/25: [facility] consulted for evaluation  Assessment/Plan: Iron deficiency anemia/Colon mass -held plavix, s/p 3 units PRBC on day 1, Plavix stopped -stop IV PPI Q12 -GI following, underwent colonoscopy on 10/24 was remarkable for a transverse colon mass -[facility] consulted, for surgical opinion  -Pathology pending   Dementia with sundowning -agitated overnight 10/19-20 and got Ativan x1 dose -avoid Benzos for delirium, Low dose Haldol PRN if needed, re-orientation techniques -improved since, no significant episodes since   Acute on chronic Diastolic CHF -improved with diuresis, ECHO with preserved EF -changed to PO Lasix-same dose  CAD: s/p of CABG in 1991 -stable, held plavix   Arthritis: -Tylenol and tramadol  Retinitis: - On cellcept - continue home eye drops   Atrial Fibrillation: CHA2DS2-VASc Score is 3, ideally needs oral anticoagulation, not Anticoagulation at home, possibly due to old age and high risk of fall.  -HR improved now, was uncontrolled 10/20 afternoon and started on Metoprolol, now stable  - continue digoxin, level is 0.5  HLD:  -Continue Lipitor  Hypothyroidism:  -Continue Synthroid  BPH: stable - Continue Flomax  DVT ppx: SCDs  Code Status: Full code Family Communication: daughter at bed side Disposition Plan:  SNF  pending workup        HPI/Subjective: No complaints, no events overnight, in good spirits   Objective: Filed Vitals:   12/13/14 1317  BP: 99/57  Pulse: 79  Temp: 98.2 F (36.8 C)  Resp: 16    Intake/Output Summary (Last 24 hours) at 12/13/14 1326 Last data filed at 12/13/14 0830  Gross per 24 hour  Intake   1320 ml  Output   2000 ml  Net   -680 ml   Filed Weights   12/12/14 0510 12/12/14 1310 12/13/14 0559  Weight: 86.5 kg (190 lb 11.2 oz) 81.647 kg (180 lb) 84.278 kg (185 lb 12.8 oz)    Exam:   General:   alert, talkative, oriented to self, place, in good spirits , pleasantly confused   Cardiovascular: S1S2/RRR  Respiratory: CTAB  Abdomen: soft, NT, BS present  Musculoskeletal: no edema c/c   Data Reviewed: Basic Metabolic Panel:  Recent Labs Lab 12/09/14 0535 12/10/14 0555 12/11/14 0510 12/12/14 0533 12/13/14 0505  NA 139 140 137 140 139  K 3.4* 4.0 4.3 4.4 4.4  CL 105 107 107 107 105  CO2 25 28 25 26 27   GLUCOSE 94 103* 101* 94 97  BUN 18 25* 26* 21* 16  CREATININE 1.13 1.08 1.22 1.00 1.05  CALCIUM 8.5* 8.7* 8.8* 8.8* 8.9   Liver Function Tests:  Recent Labs Lab 12/07/14 0144  AST 21  ALT 20  ALKPHOS 79  BILITOT 0.9  PROT 5.8*  ALBUMIN 3.6   No results for input(s): LIPASE, AMYLASE in the last 168 hours. No results for input(s): AMMONIA in the last 168 hours. CBC:  Recent Labs Lab 12/07/14 0144  12/09/14 0535 12/10/14  9323 12/11/14 0510 12/12/14 0533 12/13/14 0505  WBC 4.3  < > 5.8 6.6 6.0 4.8 5.8  NEUTROABS 2.7  --   --   --   --   --   --   HGB 4.9*  < > 7.6* 7.8* 7.8* 7.7* 8.1*  HCT 16.5*  < > 24.2* 25.5* 25.5* 25.6* 26.4*  MCV 77.5*  < > 78.3 78.5 79.4 80.5 79.8  PLT 273  < > 256 275 276 253 277  < > = values in this interval not displayed. Cardiac Enzymes: No results for input(s): CKTOTAL, CKMB, CKMBINDEX, TROPONINI in the last 168 hours. BNP (last 3 results)  Recent Labs  12/07/14 0423  BNP 630.8*     ProBNP (last 3 results) No results for input(s): PROBNP in the last 8760 hours.  CBG:  Recent Labs Lab 12/09/14 0827 12/10/14 0741 12/11/14 0805 12/12/14 0743 12/13/14 0714  GLUCAP 116* 94 106* 102* 103*    Recent Results (from the past 240 hour(s))  MRSA PCR Screening     Status: None   Collection Time: 12/07/14  4:34 AM  Result Value Ref Range Status   MRSA by PCR NEGATIVE NEGATIVE Final    Comment:        The GeneXpert MRSA Assay (FDA approved for NASAL specimens only), is one component of a comprehensive MRSA colonization surveillance program. It is not intended to diagnose MRSA infection nor to guide or monitor treatment for MRSA infections.      Studies: No results found.  Scheduled Meds: . amiodarone  150 mg Intravenous Once  . atorvastatin  40 mg Oral q1800  . erythromycin  1 application Right Eye QHS  . furosemide  40 mg Oral Daily  . l-methylfolate-B6-B12  1 tablet Oral Daily  . levothyroxine  88 mcg Oral QAC breakfast  . losartan  50 mg Oral Daily  . metoprolol tartrate  25 mg Oral BID  . multivitamin with minerals  1 tablet Oral Daily  . mycophenolate  500 mg Oral Daily  . pantoprazole (PROTONIX) IV  40 mg Intravenous Q12H  . potassium chloride  40 mEq Oral Daily  . sodium chloride  3 mL Intravenous Q12H  . tamsulosin  0.8 mg Oral Daily  . traMADol  50 mg Oral BID   Continuous Infusions: . amiodarone     Followed by  . amiodarone     Antibiotics Given (last 72 hours)    None      Principal Problem:   GIB (gastrointestinal bleeding) Active Problems:   Guaiac positive stools   Microcytic anemia   Arthritis   Hypertension   Hypothyroidism   Retinitis   Abdominal distention   Essential hypertension   A-fib ([facility])   Iron deficiency anemia due to chronic blood loss   Melena   Anemia, iron deficiency   Heme positive stool   Encounter for monitoring antiplatelet therapy   Colon cancer ([place]) - suspect transverse colon    Time  spent: 37min    Tacora Athanas  [facility] Pager [phone]. If 7PM-7AM, please contact night-coverage at [email or web address], password [facility] 12/13/2014, 1:26 PM  LOS: 6 days

## 2014-12-13 NOTE — Progress Notes (Signed)
Pt has been confused tonight. Comments have been some sexually based inappropriate comments to male nursing staff. Attempted to reorient, but patient's comments did not change. Will continue to monitor.

## 2014-12-14 LAB — GLUCOSE, CAPILLARY: GLUCOSE-CAPILLARY: 96 mg/dL (ref 65–99)

## 2014-12-14 MED ORDER — POLYETHYLENE GLYCOL 3350 17 GM/SCOOP PO POWD
1.0000 | Freq: Once | ORAL | Status: AC
  Administered 2014-12-14: 255 g via ORAL
  Filled 2014-12-14 (×2): qty 255

## 2014-12-14 NOTE — Progress Notes (Signed)
PT Cancellation Note  Patient Details Name: Jesse Burton MRN: [id number] DOB: 07/04/1925   Cancelled Treatment:    Reason Eval/Treat Not Completed: Medical issues which prohibited therapy-pt undergoing bowel prep for surgery on 10/27. Will hold PT at this time and await post-op recommendations from surgeon. Thanks.    Weston Anna, MPT Pager: [phone]

## 2014-12-14 NOTE — Progress Notes (Signed)
[facility] PROGRESS NOTE  Jesse Burton BMW:[id number] DOB: [birth year] DOA: 12/07/2014 PCP: No primary care provider on file.  HPI/Brief narrative Jesse Burton is a 80 y.o. male with PMH of Dementia, hypertension, hyperlipidemia, hypothyroidism, arthritis, CAD/CABG, retinitis, BPH, atrial fibrillation (not on anticoagulation), who presented with low hemoglobin. He was brought in ED after he was found to have low Hgb of 4.5 in [facility] [facility]. Since admission, seen by GI, plavix was stopped and received 3 units PRBC since then his Hb has been stable in the 7.8 range, underwent Colonoscopy 10/24-transverse colon mass. On 10/25 [facility] was consulted.  Assessment/Plan: Iron deficiency anemia/Colon mass -held plavix, s/p 3 units PRBC on day 1, Plavix since held -GI following, underwent colonoscopy on 10/24 was remarkable for a transverse colon mass which is likely source of bleeding -[facility] was consulted now with recs for surgery, planned for 10/26 -Pathology pos for invasive adenocarcinoma  -Oncology consulted with recs for surgery with no role in systemic chemo or radiation therapy  Dementia with sundowning -avoid Benzos for delirium, Cont low dose Haldol PRN if needed, re-orientation techniques -Stable   Acute on chronic Diastolic CHF -improved with diuresis, ECHO with preserved EF -cont with PO lasix as tolerated  CAD:  -s/p of CABG in 1991 -stable, holding plavix   Arthritis: -Continued tylenol and tramadol  Retinitis: - Continued on cellcept - continue home eye drops   Atrial Fibrillation:  -CHA2DS2-VASc Score is 3, which indicates pt would normally benefit from anticoagulation, however, pt was not on anticoagulation at home, likely secondary to old age and high risk of fall.  -HR improved now, was uncontrolled 10/20 afternoon and started on Metoprolol, now stable  - continue digoxin, level is 0.5  HLD:  -Continue Lipitor  Hypothyroidism:  -Continue  Synthroid  BPH:  - stable - Continue Flomax  DVT ppx: SCDs  Code Status: Full Family Communication: Pt in room, family at bedside Disposition Plan: Pending   Consultants:  Cardiology  Oncology  General Surgery  GI  Procedures:  Colonoscopy 10/24  Antibiotics: Anti-infectives    None      HPI/Subjective: No complaints this AM, baseline confused  Objective: Filed Vitals:   12/13/14 1641 12/13/14 1805 12/13/14 2044 12/13/14 2234  BP: 114/63 106/52 115/54   Pulse: 68 63 68 54  Temp:   98 F (36.7 C)   TempSrc:   Oral   Resp:      Height:      Weight:      SpO2:   95%     Intake/Output Summary (Last 24 hours) at 12/14/14 1318 Last data filed at 12/14/14 0500  Gross per 24 hour  Intake 384.86 ml  Output   1500 ml  Net -1115.14 ml   Filed Weights   12/12/14 0510 12/12/14 1310 12/13/14 0559  Weight: 86.5 kg (190 lb 11.2 oz) 81.647 kg (180 lb) 84.278 kg (185 lb 12.8 oz)    Exam:   General:  Asleep, easily arousable  Cardiovascular: regular, s1, s2  Respiratory: normal resp effort, no wheezing  Abdomen: soft,nondistneded  Musculoskeletal: perfused, no clubbing   Data Reviewed: Basic Metabolic Panel:  Recent Labs Lab 12/09/14 0535 12/10/14 0555 12/11/14 0510 12/12/14 0533 12/13/14 0505  NA 139 140 137 140 139  K 3.4* 4.0 4.3 4.4 4.4  CL 105 107 107 107 105  CO2 25 28 25 26 27   GLUCOSE 94 103* 101* 94 97  BUN 18 25* 26* 21* 16  CREATININE 1.13 1.08 1.22 1.00 1.05  CALCIUM 8.5* 8.7* 8.8* 8.8* 8.9   Liver Function Tests: No results for input(s): AST, ALT, ALKPHOS, BILITOT, PROT, ALBUMIN in the last 168 hours. No results for input(s): LIPASE, AMYLASE in the last 168 hours. No results for input(s): AMMONIA in the last 168 hours. CBC:  Recent Labs Lab 12/09/14 0535 12/10/14 0555 12/11/14 0510 12/12/14 0533 12/13/14 0505  WBC 5.8 6.6 6.0 4.8 5.8  HGB 7.6* 7.8* 7.8* 7.7* 8.1*  HCT 24.2* 25.5* 25.5* 25.6* 26.4*  MCV 78.3 78.5  79.4 80.5 79.8  PLT 256 275 276 253 277   Cardiac Enzymes: No results for input(s): CKTOTAL, CKMB, CKMBINDEX, TROPONINI in the last 168 hours. BNP (last 3 results)  Recent Labs  12/07/14 0423  BNP 630.8*    ProBNP (last 3 results) No results for input(s): PROBNP in the last 8760 hours.  CBG:  Recent Labs Lab 12/10/14 0741 12/11/14 0805 12/12/14 0743 12/13/14 0714 12/14/14 0735  GLUCAP 94 106* 102* 103* 96    Recent Results (from the past 240 hour(s))  MRSA PCR Screening     Status: None   Collection Time: 12/07/14  4:34 AM  Result Value Ref Range Status   MRSA by PCR NEGATIVE NEGATIVE Final    Comment:        The GeneXpert MRSA Assay (FDA approved for NASAL specimens only), is one component of a comprehensive MRSA colonization surveillance program. It is not intended to diagnose MRSA infection nor to guide or monitor treatment for MRSA infections.      Studies: No results found.  Scheduled Meds: . atorvastatin  40 mg Oral q1800  . erythromycin  1 application Right Eye QHS  . furosemide  40 mg Oral Daily  . l-methylfolate-B6-B12  1 tablet Oral Daily  . levothyroxine  88 mcg Oral QAC breakfast  . losartan  50 mg Oral Daily  . metoprolol tartrate  25 mg Oral BID  . multivitamin with minerals  1 tablet Oral Daily  . mycophenolate  500 mg Oral Daily  . polyethylene glycol powder  1 Container Oral Once  . potassium chloride  40 mEq Oral Daily  . sodium chloride  3 mL Intravenous Q12H  . tamsulosin  0.8 mg Oral Daily  . traMADol  50 mg Oral BID   Continuous Infusions: . amiodarone 30 mg/hr (12/14/14 0235)    Principal Problem:   Colonic mass Active Problems:   Guaiac positive stools   Microcytic anemia   Arthritis   Hypertension   Hypothyroidism   Retinitis   GIB (gastrointestinal bleeding)   Abdominal distention   Essential hypertension   A-fib (HCC)   Iron deficiency anemia due to chronic blood loss   Melena   Anemia, iron deficiency    Heme positive stool   Encounter for monitoring antiplatelet therapy   Colon cancer ([place]) - suspect transverse colon   Hx of CABG   Pre-operative cardiovascular examination   Jesse Burton, [facility] Pager [phone]. If 7PM-7AM, please contact night-coverage at [email or web address], password [facility] 12/14/2014, 1:18 PM  LOS: 7 days

## 2014-12-14 NOTE — Progress Notes (Signed)
Patient Name: Jesse Burton Date of Encounter: 12/14/2014  Principal Problem:   Colonic mass Active Problems:   Guaiac positive stools   Microcytic anemia   Arthritis   Hypertension   Hypothyroidism   Retinitis   GIB (gastrointestinal bleeding)   Abdominal distention   Essential hypertension   A-fib (HCC)   Iron deficiency anemia due to chronic blood loss   Melena   Anemia, iron deficiency   Heme positive stool   Encounter for monitoring antiplatelet therapy   Colon cancer ([place]) - suspect transverse colon   Hx of CABG   Pre-operative cardiovascular examination     Primary Cardiologist: Dr. Mare Ferrari - 10+ years ago; Now Dr. Johnsie Cancel Patient Profile: 81 y.o. male with past medical history of HTN, HLD, Hypothyroidism, CAD (s/p CABG 1991), Paroxysmal Atrial Fibrillation (not on anticoagulation), and dementia (with sundowning) who presented to [name] on 12/07/2014 with a hemoglobin of 4.5 found to have a non-obstructing mass in the transverse colon. Initially consulted for preoperative clearance.  SUBJECTIVE: Resting comfortably in bed. Reports "feeling sleepy". Denies any chest pain or shortness of breath.  OBJECTIVE Filed Vitals:   12/13/14 1641 12/13/14 1805 12/13/14 2044 12/13/14 2234  BP: 114/63 106/52 115/54   Pulse: 68 63 68 54  Temp:   98 F (36.7 C)   TempSrc:   Oral   Resp:      Height:      Weight:      SpO2:   95%     Intake/Output Summary (Last 24 hours) at 12/14/14 0813 Last data filed at 12/14/14 0500  Gross per 24 hour  Intake 1224.86 ml  Output   2000 ml  Net -775.14 ml   Filed Weights   12/12/14 0510 12/12/14 1310 12/13/14 0559  Weight: 190 lb 11.2 oz (86.5 kg) 180 lb (81.647 kg) 185 lb 12.8 oz (84.278 kg)    PHYSICAL EXAM General: Well developed, well nourished, male in no acute distress. Head: Normocephalic, atraumatic.  Neck: Supple without bruits, JVD not elevated. Lungs:  Resp regular and unlabored, CTA without  wheezing or rales. Heart: Irregularly irregular, S1, S2, no S3, S4, or murmur; no rub. Abdomen: Soft, non-tender, non-distended with normoactive bowel sounds. No hepatomegaly. No rebound/guarding. No obvious abdominal masses. Extremities: No clubbing, cyanosis, or edema. Distal pedal pulses are 2+ bilaterally. Neuro: Alert and oriented X 3. Moves all extremities spontaneously. Psych: Normal affect.   LABS: CBC: Recent Labs  12/12/14 0533 12/13/14 0505  WBC 4.8 5.8  HGB 7.7* 8.1*  HCT 25.6* 26.4*  MCV 80.5 79.8  PLT 253 277   INR:No results for input(s): INR in the last 72 hours. Basic Metabolic Panel: Recent Labs  12/12/14 0533 12/13/14 0505  NA 140 139  K 4.4 4.4  CL 107 105  CO2 26 27  GLUCOSE 94 97  BUN 21* 16  CREATININE 1.00 1.05  CALCIUM 8.8* 8.9   BNP:  B NATRIURETIC PEPTIDE  Date/Time Value Ref Range Status  12/07/2014 04:23 AM 630.8* 0.0 - 100.0 pg/mL Final   TELE: Atrial fibrillation with rate in 50's - 80's.       ECHO: 12/09/2014 Study Conclusions - Left ventricle: The cavity size was normal. There was moderate concentric hypertrophy. Systolic function was normal. The estimated ejection fraction was in the range of 60% to 65%. Wall motion was normal; there were no regional wall motion abnormalities. - Mitral valve: There was mild regurgitation. - Left atrium: The atrium was  moderately to severely dilated. Anterior-posterior dimension: 49 mm.   Current Medications:  . atorvastatin  40 mg Oral q1800  . erythromycin  1 application Right Eye QHS  . furosemide  40 mg Oral Daily  . l-methylfolate-B6-B12  1 tablet Oral Daily  . levothyroxine  88 mcg Oral QAC breakfast  . losartan  50 mg Oral Daily  . metoprolol tartrate  25 mg Oral BID  . multivitamin with minerals  1 tablet Oral Daily  . mycophenolate  500 mg Oral Daily  . potassium chloride  40 mEq Oral Daily  . sodium chloride  3 mL Intravenous Q12H  . tamsulosin  0.8 mg Oral Daily    . traMADol  50 mg Oral BID   . amiodarone 30 mg/hr (12/14/14 0235)    ASSESSMENT AND PLAN: 1. Preoperative Risk Assessment for Partial Colectomy - presented with hemoglobin of 4.5 and history of dark tarry stools. Colonoscopy performed on 12/12/2014 showed non-obstructing mass in the transverse colon with further pathology pending, likely to represent colon cancer.  - patient is of moderate risk from a cardiac perspective for the procedure mentioned above.   2. Chronic Atrial Fibrillation -This patients CHA2DS2-VASc Score and unadjusted Ischemic Stroke Rate (% per year) is equal to 4.8 % stroke rate/year from a score of 4 (HTN, MI, Age > 75x2). Was not on anticoagulation prior to admission. Would not be candidate currently due to GI bleed. - patient has been noted to have NSR at times and atrial fibrillation with rates in 130's at time. Started on IV Amiodarone on 12/13/2014 for better control of HR and rhythm prior to procedure. Can be switched to PO after the procedure and once tolerating PO medications. Digoxin discontinued.  - continue Metoprolol for rate control as HR will allow.  3. History of CAD - s/p CABG 1991, with no subsequent interventions since. - Plavix has been held in setting of GI Bleed.  Signed, Erma Heritage , PA-C 8:13 AM 12/14/2014 Pager: [phone]  No cardiac complaints  Plan for bowel prep today and surgery laprascopically in am with Dr Marlou Starks Moderate cardiac risk but no other options given severe anemia and obstructive nature of likely Colon cancer.  Continue beta blocker and amiodarone    Jenkins Rouge

## 2014-12-14 NOTE — Progress Notes (Signed)
2 Days Post-Op  Subjective: No complaints  Objective: Vital signs in last 24 hours: Temp:  [98 F (36.7 C)-98.3 F (36.8 C)] 98 F (36.7 C) (10/25 2044) Pulse Rate:  [54-79] 54 (10/25 2234) Resp:  [16] 16 (10/25 1317) BP: (99-118)/(48-63) 115/54 mmHg (10/25 2044) SpO2:  [95 %-99 %] 95 % (10/25 2044) Last BM Date: 12/12/14  Intake/Output from previous day: 10/25 0701 - 10/26 0700 In: 1224.9 [P.O.:1080; I.V.:144.9] Out: 2000 [Urine:2000] Intake/Output this shift:    Resp: clear to auscultation bilaterally Cardio: regular rate and rhythm GI: soft, non-tender; bowel sounds normal; no masses,  no organomegaly  Lab Results:   Recent Labs  12/12/14 0533 12/13/14 0505  WBC 4.8 5.8  HGB 7.7* 8.1*  HCT 25.6* 26.4*  PLT 253 277   BMET  Recent Labs  12/12/14 0533 12/13/14 0505  NA 140 139  K 4.4 4.4  CL 107 105  CO2 26 27  GLUCOSE 94 97  BUN 21* 16  CREATININE 1.00 1.05  CALCIUM 8.8* 8.9   PT/INR No results for input(s): LABPROT, INR in the last 72 hours. ABG No results for input(s): PHART, HCO3 in the last 72 hours.  Invalid input(s): PCO2, PO2  Studies/Results: No results found.  Anti-infectives: Anti-infectives    None      Assessment/Plan: s/p Procedure(s): COLONOSCOPY WITH PROPOFOL (N/A) Oncology says no other medical option. Cards says he is at moderate risk. I have discussed all this with the family and they agree that surgery seems like the best option. I will plan for a laparoscopic assisted partial colectomy. Risks and benefits of surgery as well as some of the technical aspects including the risk of leak were discussed with the patient and family and they understand and wish to proceed. Will bowel prep today and plan for surgery tomorrow  LOS: 7 days    TOTH III,Tysheena Ginzburg S 12/14/2014

## 2014-12-14 NOTE — Progress Notes (Signed)
Quick Note:  Informed at hospital Does not need recall given age To have surgery at hospital ______

## 2014-12-15 ENCOUNTER — Inpatient Hospital Stay (HOSPITAL_COMMUNITY): Payer: Medicare Other | Admitting: Anesthesiology

## 2014-12-15 ENCOUNTER — Encounter (HOSPITAL_COMMUNITY)
Admission: EM | Disposition: A | Discharge status: Skilled Nursing Facility | Payer: Self-pay | Source: Home / Self Care | Attending: Internal Medicine

## 2014-12-15 ENCOUNTER — Encounter (HOSPITAL_COMMUNITY): Payer: Self-pay | Admitting: General Surgery

## 2014-12-15 HISTORY — PX: COLON RESECTION: SHX5231

## 2014-12-15 LAB — CBC
HEMATOCRIT: 26 % — AB (ref 39.0–52.0)
Hemoglobin: 7.9 g/dL — ABNORMAL LOW (ref 13.0–17.0)
MCH: 24.4 pg — ABNORMAL LOW (ref 26.0–34.0)
MCHC: 30.4 g/dL (ref 30.0–36.0)
MCV: 80.2 fL (ref 78.0–100.0)
PLATELETS: 270 10*3/uL (ref 150–400)
RBC: 3.24 MIL/uL — AB (ref 4.22–5.81)
RDW: 18.5 % — AB (ref 11.5–15.5)
WBC: 4.5 10*3/uL (ref 4.0–10.5)

## 2014-12-15 LAB — GLUCOSE, CAPILLARY
GLUCOSE-CAPILLARY: 199 mg/dL — AB (ref 65–99)
GLUCOSE-CAPILLARY: 215 mg/dL — AB (ref 65–99)
Glucose-Capillary: 95 mg/dL (ref 65–99)

## 2014-12-15 LAB — TYPE AND SCREEN
ABO/RH(D): A POS
Antibody Screen: NEGATIVE

## 2014-12-15 SURGERY — COLON RESECTION LAPAROSCOPIC
Anesthesia: General | Site: Abdomen

## 2014-12-15 MED ORDER — BUPIVACAINE-EPINEPHRINE 0.25% -1:200000 IJ SOLN
INTRAMUSCULAR | Status: DC | PRN
  Administered 2014-12-15: 18 mL

## 2014-12-15 MED ORDER — SUCCINYLCHOLINE CHLORIDE 20 MG/ML IJ SOLN
INTRAMUSCULAR | Status: DC | PRN
  Administered 2014-12-15: 100 mg via INTRAVENOUS

## 2014-12-15 MED ORDER — MORPHINE SULFATE (PF) 2 MG/ML IV SOLN
1.0000 mg | INTRAVENOUS | Status: DC | PRN
  Administered 2014-12-15: 2 mg via INTRAVENOUS

## 2014-12-15 MED ORDER — PROPOFOL 10 MG/ML IV BOLUS
INTRAVENOUS | Status: DC | PRN
  Administered 2014-12-15: 100 mg via INTRAVENOUS

## 2014-12-15 MED ORDER — FENTANYL CITRATE (PF) 100 MCG/2ML IJ SOLN
INTRAMUSCULAR | Status: AC
  Filled 2014-12-15: qty 2

## 2014-12-15 MED ORDER — CHLORHEXIDINE GLUCONATE CLOTH 2 % EX PADS: 6.0000 | MEDICATED_PAD | Freq: Once | CUTANEOUS | Status: DC

## 2014-12-15 MED ORDER — ONDANSETRON HCL 4 MG/2ML IJ SOLN
INTRAMUSCULAR | Status: DC | PRN
  Administered 2014-12-15: 4 mg via INTRAVENOUS

## 2014-12-15 MED ORDER — LACTATED RINGERS IR SOLN
Status: DC | PRN
  Administered 2014-12-15: 1

## 2014-12-15 MED ORDER — DEXTROSE 5 % IV SOLN
INTRAVENOUS | Status: AC
  Filled 2014-12-15: qty 2

## 2014-12-15 MED ORDER — DEXTROSE 5 % IV SOLN
2.0000 g | Freq: Once | INTRAVENOUS | Status: AC
  Administered 2014-12-15: 2 g via INTRAVENOUS
  Filled 2014-12-15: qty 2

## 2014-12-15 MED ORDER — KCL IN DEXTROSE-NACL 20-5-0.9 MEQ/L-%-% IV SOLN
INTRAVENOUS | Status: DC
  Administered 2014-12-15 – 2014-12-16 (×3): via INTRAVENOUS
  Filled 2014-12-15 (×5): qty 1000

## 2014-12-15 MED ORDER — LACTATED RINGERS IV SOLN
INTRAVENOUS | Status: DC | PRN
  Administered 2014-12-15: 10:00:00 via INTRAVENOUS

## 2014-12-15 MED ORDER — DEXAMETHASONE SODIUM PHOSPHATE 10 MG/ML IJ SOLN
INTRAMUSCULAR | Status: DC | PRN
  Administered 2014-12-15: 10 mg via INTRAVENOUS

## 2014-12-15 MED ORDER — SUGAMMADEX SODIUM 200 MG/2ML IV SOLN
INTRAVENOUS | Status: DC | PRN
  Administered 2014-12-15: 200 mg via INTRAVENOUS

## 2014-12-15 MED ORDER — FENTANYL CITRATE (PF) 100 MCG/2ML IJ SOLN
INTRAMUSCULAR | Status: AC
  Filled 2014-12-15: qty 4

## 2014-12-15 MED ORDER — FENTANYL CITRATE (PF) 100 MCG/2ML IJ SOLN
INTRAMUSCULAR | Status: DC | PRN
Start: 2014-12-15 — End: 2014-12-15
  Administered 2014-12-15: 50 ug via INTRAVENOUS

## 2014-12-15 MED ORDER — ROCURONIUM BROMIDE 100 MG/10ML IV SOLN
INTRAVENOUS | Status: AC
  Filled 2014-12-15: qty 1

## 2014-12-15 MED ORDER — FENTANYL CITRATE (PF) 100 MCG/2ML IJ SOLN
25.0000 ug | INTRAMUSCULAR | Status: DC | PRN
  Administered 2014-12-15 (×2): 50 ug via INTRAVENOUS

## 2014-12-15 MED ORDER — ALVIMOPAN 12 MG PO CAPS: 12.0000 mg | ORAL_CAPSULE | Freq: Once | ORAL | Status: DC

## 2014-12-15 MED ORDER — VITAMINS A & D EX OINT
TOPICAL_OINTMENT | CUTANEOUS | Status: AC
  Administered 2014-12-15: 5
  Filled 2014-12-15: qty 5

## 2014-12-15 MED ORDER — PHENYLEPHRINE 40 MCG/ML (10ML) SYRINGE FOR IV PUSH (FOR BLOOD PRESSURE SUPPORT)
PREFILLED_SYRINGE | INTRAVENOUS | Status: AC
  Filled 2014-12-15: qty 10

## 2014-12-15 MED ORDER — PEG 3350-KCL-NA BICARB-NACL 420 G PO SOLR: 4000.0000 mL | Freq: Once | ORAL | Status: DC

## 2014-12-15 MED ORDER — LACTATED RINGERS IV SOLN
INTRAVENOUS | Status: DC
  Administered 2014-12-15: 1000 mL via INTRAVENOUS

## 2014-12-15 MED ORDER — PHENYLEPHRINE HCL 10 MG/ML IJ SOLN
INTRAMUSCULAR | Status: DC | PRN
  Administered 2014-12-15: 80 ug via INTRAVENOUS

## 2014-12-15 MED ORDER — LIDOCAINE HCL (CARDIAC) 20 MG/ML IV SOLN
INTRAVENOUS | Status: AC
  Filled 2014-12-15: qty 5

## 2014-12-15 MED ORDER — SODIUM CHLORIDE 0.9 % IV SOLN
10.0000 mg | INTRAVENOUS | Status: DC | PRN
  Administered 2014-12-15: 50 ug/min via INTRAVENOUS

## 2014-12-15 MED ORDER — EPHEDRINE SULFATE 50 MG/ML IJ SOLN
INTRAMUSCULAR | Status: DC | PRN
  Administered 2014-12-15: 10 mg via INTRAVENOUS
  Administered 2014-12-15: 15 mg via INTRAVENOUS

## 2014-12-15 MED ORDER — BUPIVACAINE-EPINEPHRINE 0.25% -1:200000 IJ SOLN
INTRAMUSCULAR | Status: AC
  Filled 2014-12-15: qty 1

## 2014-12-15 MED ORDER — LIDOCAINE HCL (CARDIAC) 20 MG/ML IV SOLN
INTRAVENOUS | Status: DC | PRN
  Administered 2014-12-15: 75 mg via INTRAVENOUS

## 2014-12-15 MED ORDER — ROCURONIUM BROMIDE 100 MG/10ML IV SOLN
INTRAVENOUS | Status: DC | PRN
  Administered 2014-12-15: 10 mg via INTRAVENOUS
  Administered 2014-12-15: 40 mg via INTRAVENOUS

## 2014-12-15 MED ORDER — HYDROMORPHONE HCL 1 MG/ML IJ SOLN
INTRAMUSCULAR | Status: DC
Start: 2014-12-15 — End: 2014-12-15
  Filled 2014-12-15: qty 1

## 2014-12-15 MED ORDER — DEXAMETHASONE SODIUM PHOSPHATE 10 MG/ML IJ SOLN
INTRAMUSCULAR | Status: AC
  Filled 2014-12-15: qty 1

## 2014-12-15 MED ORDER — PROPOFOL 10 MG/ML IV BOLUS
INTRAVENOUS | Status: AC
  Filled 2014-12-15: qty 20

## 2014-12-15 MED ORDER — MORPHINE SULFATE (PF) 2 MG/ML IV SOLN
1.0000 mg | INTRAVENOUS | Status: DC | PRN
  Filled 2014-12-15: qty 1

## 2014-12-15 MED ORDER — 0.9 % SODIUM CHLORIDE (POUR BTL) OPTIME
TOPICAL | Status: DC | PRN
  Administered 2014-12-15: 1000 mL

## 2014-12-15 MED ORDER — MORPHINE SULFATE (PF) 2 MG/ML IV SOLN: 1.0000 mg | INTRAVENOUS | Status: DC | PRN

## 2014-12-15 MED ORDER — MORPHINE SULFATE (PF) 2 MG/ML IV SOLN
1.0000 mg | INTRAVENOUS | Status: DC | PRN
  Administered 2014-12-15 – 2014-12-18 (×10): 2 mg via INTRAVENOUS
  Filled 2014-12-15 (×10): qty 1

## 2014-12-15 MED ORDER — ONDANSETRON HCL 4 MG/2ML IJ SOLN
INTRAMUSCULAR | Status: AC
  Filled 2014-12-15: qty 2

## 2014-12-15 SURGICAL SUPPLY — 57 items
APPLIER CLIP 5 13 M/L LIGAMAX5 (MISCELLANEOUS)
APPLIER CLIP ROT 10 11.4 M/L (STAPLE) ×2
APR CLP MED LRG 11.4X10 (STAPLE) ×1
APR CLP MED LRG 5 ANG JAW (MISCELLANEOUS)
BLADE EXTENDED COATED 6.5IN (ELECTRODE) ×2 IMPLANT
BLADE HEX COATED 2.75 (ELECTRODE) ×2 IMPLANT
CABLE HIGH FREQUENCY MONO STRZ (ELECTRODE) ×1 IMPLANT
CELLS DAT CNTRL 66122 CELL SVR (MISCELLANEOUS) IMPLANT
CLIP APPLIE 5 13 M/L LIGAMAX5 (MISCELLANEOUS) IMPLANT
CLIP APPLIE ROT 10 11.4 M/L (STAPLE) ×1 IMPLANT
COVER SURGICAL LIGHT HANDLE (MISCELLANEOUS) ×2 IMPLANT
DECANTER SPIKE VIAL GLASS SM (MISCELLANEOUS) ×2 IMPLANT
DRAPE LAPAROSCOPIC ABDOMINAL (DRAPES) ×2 IMPLANT
DRSG OPSITE POSTOP 4X6 (GAUZE/BANDAGES/DRESSINGS) ×1 IMPLANT
DRSG TEGADERM 2-3/8X2-3/4 SM (GAUZE/BANDAGES/DRESSINGS) ×2 IMPLANT
ELECT REM PT RETURN 9FT ADLT (ELECTROSURGICAL) ×2
ELECTRODE REM PT RTRN 9FT ADLT (ELECTROSURGICAL) ×1 IMPLANT
ENSEAL DEVICE STD TIP 35CM (ENDOMECHANICALS) IMPLANT
GAUZE SPONGE 4X4 12PLY STRL (GAUZE/BANDAGES/DRESSINGS) ×2 IMPLANT
GLOVE BIO SURGEON STRL SZ7.5 (GLOVE) ×4 IMPLANT
GLOVE BIOGEL PI IND STRL 7.0 (GLOVE) ×1 IMPLANT
GLOVE BIOGEL PI INDICATOR 7.0 (GLOVE) ×1
GOWN STRL REUS W/ TWL XL LVL3 (GOWN DISPOSABLE) ×1 IMPLANT
GOWN STRL REUS W/TWL LRG LVL3 (GOWN DISPOSABLE) ×2 IMPLANT
GOWN STRL REUS W/TWL XL LVL3 (GOWN DISPOSABLE) ×4 IMPLANT
LEGGING LITHOTOMY PAIR STRL (DRAPES) ×2 IMPLANT
LIGASURE IMPACT 36 18CM CVD LR (INSTRUMENTS) ×2 IMPLANT
LIQUID BAND (GAUZE/BANDAGES/DRESSINGS) IMPLANT
NS IRRIG 1000ML POUR BTL (IV SOLUTION) ×2 IMPLANT
PACK COLON (CUSTOM PROCEDURE TRAY) ×2 IMPLANT
RELOAD PROXIMATE 75MM BLUE (ENDOMECHANICALS) ×4 IMPLANT
RELOAD STAPLE 75 3.8 BLU REG (ENDOMECHANICALS) IMPLANT
RETRACTOR WND ALEXIS 18 MED (MISCELLANEOUS) IMPLANT
RTRCTR WOUND ALEXIS 18CM MED (MISCELLANEOUS)
SCISSORS LAP 5X35 DISP (ENDOMECHANICALS) ×2 IMPLANT
SET IRRIG TUBING LAPAROSCOPIC (IRRIGATION / IRRIGATOR) ×2 IMPLANT
SHEARS HARMONIC ACE PLUS 36CM (ENDOMECHANICALS) ×2 IMPLANT
SOLUTION ANTI FOG 6CC (MISCELLANEOUS) ×2 IMPLANT
STAPLER PROXIMATE 75MM BLUE (STAPLE) ×1 IMPLANT
STAPLER VISISTAT 35W (STAPLE) ×2 IMPLANT
SUT PDS AB 1 CTX 36 (SUTURE) ×4 IMPLANT
SUT PROLENE 2 0 SH DA (SUTURE) ×2 IMPLANT
SUT SILK 2 0 (SUTURE) ×2
SUT SILK 2 0 SH CR/8 (SUTURE) ×2 IMPLANT
SUT SILK 2-0 18XBRD TIE 12 (SUTURE) ×1 IMPLANT
SUT SILK 3 0 (SUTURE) ×2
SUT SILK 3 0 SH CR/8 (SUTURE) ×2 IMPLANT
SUT SILK 3-0 18XBRD TIE 12 (SUTURE) ×1 IMPLANT
TRAY FOLEY W/METER SILVER 14FR (SET/KITS/TRAYS/PACK) ×4 IMPLANT
TRAY FOLEY W/METER SILVER 16FR (SET/KITS/TRAYS/PACK) ×4 IMPLANT
TROCAR BLADELESS OPT 5 75 (ENDOMECHANICALS) ×2 IMPLANT
TROCAR XCEL BLUNT TIP 100MML (ENDOMECHANICALS) ×2 IMPLANT
TROCAR XCEL NON-BLD 11X100MML (ENDOMECHANICALS) ×2 IMPLANT
TROCAR XCEL UNIV SLVE 11M 100M (ENDOMECHANICALS) ×2 IMPLANT
TUBING FILTER THERMOFLATOR (ELECTROSURGICAL) ×2 IMPLANT
TUBING INSUFFLATION 10FT LAP (TUBING) ×2 IMPLANT
YANKAUER SUCT BULB TIP NO VENT (SUCTIONS) ×2 IMPLANT

## 2014-12-15 NOTE — Care Management Note (Signed)
Case Management Note  Patient Details  Name: Jesse Burton MRN: [id number] Date of Birth: 1925/12/24  Subjective/Objective:  S/p R lap colectomy today.                  Action/Plan:d/c plan SNF   Expected Discharge Date:                  Expected Discharge Plan:  Skilled Nursing Facility  In-House Referral:  Clinical Social Work  Discharge planning Services  CM Consult  Post Acute Care Choice:    Choice offered to:     DME Arranged:    DME Agency:     HH Arranged:    [place] Agency:     Status of Service:  In process, will continue to follow  Medicare Important Message Given:  Yes-third notification given Date Medicare IM Given:    Medicare IM give by:    Date Additional Medicare IM Given:    Additional Medicare Important Message give by:     If discussed at [place] of Stay Meetings, dates discussed:    Additional Comments:  Dessa Phi, RN 12/15/2014, 3:47 PM

## 2014-12-15 NOTE — Progress Notes (Signed)
Patient Name: Jesse Burton Date of Encounter: 12/15/2014  Principal Problem:   Colonic mass Active Problems:   Guaiac positive stools   Microcytic anemia   Arthritis   Hypertension   Hypothyroidism   Retinitis   GIB (gastrointestinal bleeding)   Abdominal distention   Essential hypertension   A-fib (HCC)   Iron deficiency anemia due to chronic blood loss   Melena   Anemia, iron deficiency   Heme positive stool   Encounter for monitoring antiplatelet therapy   Colon cancer ([place]) - suspect transverse colon   Hx of CABG   Pre-operative cardiovascular examination    Primary Cardiologist: Dr. Mare Ferrari - 10+ years ago; Now Dr. Johnsie Cancel Patient Profile: 81 y.o. male with past medical history of HTN, HLD, Hypothyroidism, CAD (s/p CABG 1991), Paroxysmal Atrial Fibrillation (not on anticoagulation), and dementia (with sundowning) who presented to [place] ED on 12/07/2014 with a hemoglobin of 4.5 found to have a non-obstructing mass in the transverse colon. Initially consulted for preoperative clearance.  SUBJECTIVE: Reports feeling "exceptionally well" this AM after having a bowel movement. Denies any chest pain, palpitations, or shortness of breath. Scheduled for partial colectomy this AM.  OBJECTIVE Filed Vitals:   12/13/14 2234 12/14/14 1320 12/14/14 2113 12/15/14 0353  BP:  104/67 118/52 103/50  Pulse: 54 57 72 60  Temp:  97.8 F (36.6 C) 98.1 F (36.7 C) 97.7 F (36.5 C)  TempSrc:  Oral Oral Oral  Resp:  16 18 16   Height:      Weight:    190 lb 14.7 oz (86.6 kg)  SpO2:  96% 96% 96%    Intake/Output Summary (Last 24 hours) at 12/15/14 0842 Last data filed at 12/14/14 2232  Gross per 24 hour  Intake   1392 ml  Output   2050 ml  Net   -658 ml   Filed Weights   12/12/14 1310 12/13/14 0559 12/15/14 0353  Weight: 180 lb (81.647 kg) 185 lb 12.8 oz (84.278 kg) 190 lb 14.7 oz (86.6 kg)    PHYSICAL EXAM General: Well developed, well nourished, male in no  acute distress. Head: Normocephalic, atraumatic.  Neck: Supple without bruits, JVD not elevated. Lungs:  Resp regular and unlabored, CTA without wheezing or rales. Heart: Irregularly irregular, S1, S2, no S3, S4, or murmur; no rub. Abdomen: Soft, non-tender, non-distended with normoactive bowel sounds. No hepatomegaly. No rebound/guarding. No obvious abdominal masses. Extremities: No clubbing, cyanosis, or edema. Distal pedal pulses are 2+ bilaterally. Neuro: Alert and oriented X 3. Moves all extremities spontaneously. Psych: Normal affect.  LABS: CBC: Recent Labs  12/13/14 0505 12/15/14 0450  WBC 5.8 4.5  HGB 8.1* 7.9*  HCT 26.4* 26.0*  MCV 79.8 80.2  PLT 277 370   Basic Metabolic Panel: Recent Labs  12/13/14 0505  NA 139  K 4.4  CL 105  CO2 27  GLUCOSE 97  BUN 16  CREATININE 1.05  CALCIUM 8.9   BNP:  B NATRIURETIC PEPTIDE  Date/Time Value Ref Range Status  12/07/2014 04:23 AM 630.8* 0.0 - 100.0 pg/mL Final    TELE:  Atrial fibrillation with rate in 50's - 70's. Questionable P-waves at times?       Current Medications:  . atorvastatin  40 mg Oral q1800  . erythromycin  1 application Right Eye QHS  . furosemide  40 mg Oral Daily  . l-methylfolate-B6-B12  1 tablet Oral Daily  . levothyroxine  88 mcg Oral QAC breakfast  . losartan  50 mg Oral Daily  . metoprolol tartrate  25 mg Oral BID  . multivitamin with minerals  1 tablet Oral Daily  . mycophenolate  500 mg Oral Daily  . potassium chloride  40 mEq Oral Daily  . sodium chloride  3 mL Intravenous Q12H  . tamsulosin  0.8 mg Oral Daily  . traMADol  50 mg Oral BID   . amiodarone 30 mg/hr (12/15/14 0118)    ASSESSMENT AND PLAN:  1. Preoperative Risk Assessment for Partial Colectomy - presented with hemoglobin of 4.5 and history of dark tarry stools. Colonoscopy performed on 12/12/2014 showed non-obstructing mass in the transverse colon with further pathology pending, likely to represent colon cancer.  -  patient is of moderate risk from a cardiac perspective for the procedure mentioned above.  - Planning to undergo laparoscopic partial colectomy with Dr. Marlou Starks today at 1045.  2. Chronic Atrial Fibrillation -This patients CHA2DS2-VASc Score and unadjusted Ischemic Stroke Rate (% per year) is equal to 4.8 % stroke rate/year from a score of 4 (HTN, MI, Age > 75x2). Was not on anticoagulation prior to admission. Would not be candidate currently due to GI bleed. - patient has been noted to have NSR at times and atrial fibrillation with rates in 130's at time. Started on IV Amiodarone on 12/13/2014 for better control of HR and rhythm prior to procedure. Can be switched to PO after the procedure and once tolerating PO medications. Digoxin discontinued.  - continue Metoprolol for rate control as HR will allow.  3. History of CAD - s/p CABG 1991, with no subsequent interventions since. - Plavix has been held in setting of GI Bleed.  Arna Medici , PA-C 8:42 AM 12/15/2014 Pager: [phone]  For colectomy today.  Continue iv amiodarone itraop.  Surgery needed to prevent further anemi/gi bleed and prevent Obstruction.  Distant CAD that has not been active and normal EF  Plavix has been held Will follow post op  Jenkins Rouge

## 2014-12-15 NOTE — Interval H&P Note (Signed)
History and Physical Interval Note:  12/15/2014 9:39 AM  Jesse Burton  has presented today for surgery, with the diagnosis of COLON CANCER  The various methods of treatment have been discussed with the patient and family. After consideration of risks, benefits and other options for treatment, the patient has consented to  Procedure(s): Laparoscopic partial colectomy (N/A) as a surgical intervention .  The patient's history has been reviewed, patient examined, no change in status, stable for surgery.  I have reviewed the patient's chart and labs.  Questions were answered to the patient's satisfaction.     TOTH III,Master Touchet S

## 2014-12-15 NOTE — Progress Notes (Signed)
3 Days Post-Op  Subjective: Very happy, but confused.    Objective: Vital signs in last 24 hours: Temp:  [97.7 F (36.5 C)-98.2 F (36.8 C)] 98.2 F (36.8 C) (10/27 0859) Pulse Rate:  [57-72] 63 (10/27 0859) Resp:  [16-18] 16 (10/27 0859) BP: (103-118)/(50-67) 109/59 mmHg (10/27 0859) SpO2:  [94 %-96 %] 94 % (10/27 0859) Weight:  [86.6 kg (190 lb 14.7 oz)] 86.6 kg (190 lb 14.7 oz) (10/27 0353) Last BM Date: 12/15/14 960 PO yesterday BM x 5 H/H is stable Intake/Output from previous day: 10/26 0701 - 10/27 0700 In: 1392 [P.O.:960; I.V.:432] Out: 2050 [Urine:2050] Intake/Output this shift:    General appearance: alert, cooperative and no distress Resp: clear to auscultation bilaterally GI: soft, non-tender; bowel sounds normal; no masses,  no organomegaly  Lab Results:   Recent Labs  12/13/14 0505 12/15/14 0450  WBC 5.8 4.5  HGB 8.1* 7.9*  HCT 26.4* 26.0*  PLT 277 270    BMET  Recent Labs  12/13/14 0505  NA 139  K 4.4  CL 105  CO2 27  GLUCOSE 97  BUN 16  CREATININE 1.05  CALCIUM 8.9   PT/INR No results for input(s): LABPROT, INR in the last 72 hours.  No results for input(s): AST, ALT, ALKPHOS, BILITOT, PROT, ALBUMIN in the last 168 hours.   Lipase  No results found for: LIPASE   Studies/Results: No results found.  Medications: . atorvastatin  40 mg Oral q1800  . erythromycin  1 application Right Eye QHS  . furosemide  40 mg Oral Daily  . l-methylfolate-B6-B12  1 tablet Oral Daily  . levothyroxine  88 mcg Oral QAC breakfast  . losartan  50 mg Oral Daily  . metoprolol tartrate  25 mg Oral BID  . multivitamin with minerals  1 tablet Oral Daily  . mycophenolate  500 mg Oral Daily  . potassium chloride  40 mEq Oral Daily  . sodium chloride  3 mL Intravenous Q12H  . tamsulosin  0.8 mg Oral Daily  . traMADol  50 mg Oral BID   . amiodarone 30 mg/hr (12/15/14 0118)   Prior to Admission medications   Medication Sig Start Date End Date Taking?  Authorizing Provider  acetaminophen (TYLENOL) 325 MG tablet Take 650 mg by mouth daily.   Yes Historical Provider, MD  acetaminophen (TYLENOL) 325 MG tablet Take 650 mg by mouth every 6 (six) hours as needed (for mild pain.).   Yes Historical Provider, MD  atorvastatin (LIPITOR) 40 MG tablet Take 40 mg by mouth daily.   Yes Historical Provider, MD  clopidogrel (PLAVIX) 75 MG tablet Take 75 mg by mouth daily.   Yes Historical Provider, MD  digoxin (LANOXIN) 0.125 MG tablet Take 0.125 mg by mouth daily.   Yes Historical Provider, MD  erythromycin ophthalmic ointment Place 1 application into the right eye at bedtime.   Yes Historical Provider, MD  furosemide (LASIX) 20 MG tablet Take 20 mg by mouth every other day.   Yes Historical Provider, MD  furosemide (LASIX) 40 MG tablet Take 40 mg by mouth every other day.   Yes Historical Provider, MD  Glucosamine-Chondroitin 750-600 MG TABS Take 2 tablets by mouth daily.   Yes Historical Provider, MD  L-Methylfolate-B12-B6-B2 (CEREFOLIN) 07-19-48-5 MG TABS Take 1 tablet by mouth daily.   Yes Historical Provider, MD  l-methylfolate-B6-B12 (METANX) 3-35-2 MG TABS tablet Take 1 tablet by mouth daily.   Yes Historical Provider, MD  levothyroxine (SYNTHROID, LEVOTHROID) 88 MCG tablet Take  88 mcg by mouth daily before breakfast.   Yes Historical Provider, MD  losartan (COZAAR) 50 MG tablet Take 50 mg by mouth daily.   Yes Historical Provider, MD  Multiple Vitamin (MULTIVITAMIN WITH MINERALS) TABS tablet Take 1 tablet by mouth daily. [place]   Yes Historical Provider, MD  mycophenolate (CELLCEPT) 500 MG tablet Take 500 mg by mouth daily.   Yes Historical Provider, MD  polyethylene glycol (MIRALAX / GLYCOLAX) packet Take 17 g by mouth daily.   Yes Historical Provider, MD  promethazine (PHENERGAN) 25 MG tablet Take 25 mg by mouth every 4 (four) hours as needed for nausea or vomiting.   Yes Historical Provider, MD  tamsulosin (FLOMAX) 0.4 MG CAPS capsule Take  0.8 mg by mouth daily.   Yes Historical Provider, MD  traMADol (ULTRAM) 50 MG tablet Take 50 mg by mouth 2 (two) times daily.   Yes Historical Provider, MD    Assessment/Plan Transverse colon mass/pathology:  Colon, biopsy, distal transverse INVASIVE ADENOCARCINOMA Anemia H/H= 4.9/17.1 Chronic atrial fibrillation - no anticoagulation pre admit CAD on Plavix/S/p CABG 1991 Dementia Hypertension Hypthyroid BPH Hyperlipidemia  Plan:  For partial colectomy today.     LOS: 8 days    Jesse Burton 12/15/2014

## 2014-12-15 NOTE — Progress Notes (Signed)
Patient ID: Jesse Burton, male   DOB: 1925/07/03, 81 y.o.   MRN: [id number]  [facility] PROGRESS NOTE  Elisha Cooksey TML:[id number] DOB: [birth year] DOA: 12/07/2014 PCP: No primary care provider on file.   Brief narrative:     81 y.o. male with PMH of Dementia, hypertension, hyperlipidemia, hypothyroidism, arthritis, CAD/CABG, retinitis, BPH, atrial fibrillation (not on anticoagulation), who presented with low hemoglobin. He was brought in ED after he was found to have low Hgb of 4.5 in [facility]. Since admission, seen by GI, plavix was stopped and received 3 units PRBC since then his Hb has been stable in the 7.8 range, underwent Colonoscopy 10/24-transverse colon mass. On 10/25 [facility] was consulted.  Assessment/Plan:    Iron deficiency anemia/Colon mass - held plavix, s/p 3 units PRBC on day 1, Plavix since held - GI following, underwent colonoscopy on 10/24 was remarkable for a transverse colon mass which is likely source of bleeding - [facility] was consulted, underwent laparoscopic right colectomy today 10/27 - Pathology pos for invasive adenocarcinoma  - Oncology consulted with recs for surgery with no role in systemic chemo or radiation therapy  Dementia with sundowning - avoid Benzos for delirium, Cont low dose Haldol PRN if needed, re-orientation techniques - Stable this AM  Acute on chronic Diastolic CHF - improved with diuresis, ECHO with preserved EF - cont with PO lasix as tolerated  CAD:  - s/p of CABG in 1991 - stable, holding plavix   Arthritis: - Continued tylenol and tramadol if needed   Retinitis: - Continued on cellcept - continue home eye drops   Atrial Fibrillation:  - CHA2DS2-VASc Score is 3, which indicates pt would normally benefit from anticoagulation, however, pt was not on anticoagulation at home, likely secondary to old age and high risk of fall.  - HR improved now, was uncontrolled 10/20 afternoon and started on Metoprolol,  now stable  - continue amiodarone   HLD:  -Continue Lipitor  Hypothyroidism:  -Continue Synthroid  BPH:  - stable - Continue Flomax  DVT prophylaxis - SCD's  Code Status: Full.  Family Communication:  plan of care discussed with the patient and daughter at bedside  Disposition Plan: Home when stable.   IV access:  Peripheral IV  Procedures and diagnostic studies:    Ct Abdomen Pelvis W Contrast  12/07/2014  CLINICAL DATA:  Dark stools and abdominal distention. EXAM: CT ABDOMEN AND PELVIS WITH CONTRAST TECHNIQUE: Multidetector CT imaging of the abdomen and pelvis was performed using the standard protocol following bolus administration of intravenous contrast. CONTRAST:  141mL OMNIPAQUE IOHEXOL 300 MG/ML  SOLN COMPARISON:  None. FINDINGS: Normal hepatic contour. The examination is minimally degraded secondary exclusion of the dome of the diaphragm. No discrete hepatic lesions. There are multiple layering laminated gallstones seen throughout the gallbladder. This finding is associated with a potential very minimal amount of gallbladder wall thickening and pericholecystic fluid though this may be accentuated due to a minimal amount of third spacing within the abdomen. No definite intra or extrahepatic bili duct dilatation. No ascites. There is symmetric enhancement and excretion of the bilateral kidneys. No definite renal stones in this postcontrast examination. No discrete renal lesions. There is a minimal amount of bilateral perinephric stranding, likely age and body habitus related. No urinary obstruction. Normal appearance of the bilateral adrenal glands, pancreas and spleen. Ingested enteric contrast extends to the level of the distal transverse colon. Moderate colonic stool burden without evidence of enteric obstruction. The  bowel is otherwise normal in course and caliber without wall thickening. Normal appearance of the terminal ileum. There is is a minimal amount of stranding  throughout the root of the abdominal mesenteric including the right lower abdominal quadrant surrounding the appendix without focality and favored to be secondary to third spacing/volume overload. Otherwise, normal appearance of the appendix. Moderate amount of mixed calcified and noncalcified atherosclerotic plaque within a normal caliber abdominal aorta. The major branch vessels of the abdominal aorta appear patent on this non CTA examination. 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 No bulky retroperitoneal, mesenteric, pelvic or inguinal lymphadenopathy. The prostate is borderline enlarged with mass effect on the undersurface urinary bladder. Otherwise normal appearance of the urinary bladder given degree distention. There is a small amount of fluid seen within the pelvic cul-de-sac. Limited visualization of the lower thorax is degraded secondary to patient respiratory artifact. Trace bilateral pleural effusions, right greater than left, with associated dependent subpleural opacities, likely atelectasis. Cardiomegaly. Coronary artery calcifications. No pericardial effusion. No acute or aggressive osseous abnormalities. Mild-to-moderate multilevel lumbar spine DDD, worse at L2-L3 with disc space height loss, endplate irregularity and sclerosis. Mild diffuse body wall anasarca. Regional soft tissues appear otherwise normal. IMPRESSION: 1. Cardiomegaly with findings worrisome for pulmonary edema including trace bilateral effusions, right greater than left, mild diffuse body wall anasarca and minimal amount of mesenteric stranding / third-spacing throughout the peritoneum. Clinical correlation is advised. 2. Cholelithiasis with potential minimal amount of gallbladder wall thickening and pericholecystic fluid, again, potentially attributable to suspected third-spacing within the peritoneal cavity - if clinical concern persists for acute cholecystitis,  further evaluation with gallbladder ultrasound and/or nuclear medicine HIDA scan could be performed as clinically indicated. 3. Coronary artery calcifications. 4. Prostatomegaly. Electronically Signed   By: Sandi Mariscal M.D.   On: 12/07/2014 11:43    Medical Consultants:   Cardiology  Oncology  General Surgery  GI  Other Consultants:   None  IAnti-Infectives:    None  Faye Ramsay, MD  [facility] Pager [phone]  If 7PM-7AM, please contact night-coverage [email or web address] Password [email or web address] 12/15/2014, 2:51 PM   LOS: 8 days   HPI/Subjective: No events overnight.   Objective: Filed Vitals:   12/15/14 1300 12/15/14 1315 12/15/14 1330 12/15/14 1345  BP: 141/66 150/68 144/74 149/74  Pulse: 58 54 69 70  Temp:   97.4 F (36.3 C) 97.3 F (36.3 C)  TempSrc:      Resp: 16 19 17 16   Height:      Weight:      SpO2: 96% 89% 96% 95%    Intake/Output Summary (Last 24 hours) at 12/15/14 1451 Last data filed at 12/15/14 1330  Gross per 24 hour  Intake   1892 ml  Output   1105 ml  Net    787 ml    Exam:   General:  Pt is alert, follows commands appropriately, not in acute distress  Cardiovascular: Irregular rate and rhythm, no rubs, no gallops  Respiratory: Clear to auscultation bilaterally, no wheezing, no crackles, no rhonchi  Abdomen: Soft, non tender, non distended, bowel sounds present, no guarding  Data Reviewed: Basic Metabolic Panel:  Recent Labs Lab 12/09/14 0535 12/10/14 0555 12/11/14 0510 12/12/14 0533 12/13/14 0505  NA 139 140 137 140 139  K 3.4* 4.0 4.3 4.4 4.4  CL 105 107 107 107 105  CO2 25 28 25 26 27   GLUCOSE 94 103* 101* 94 97  BUN 18 25* 26* 21* 16  CREATININE 1.13 1.08 1.22 1.00 1.05  CALCIUM 8.5* 8.7* 8.8* 8.8* 8.9   CBC:  Recent Labs Lab 12/10/14 0555 12/11/14 0510 12/12/14 0533 12/13/14 0505 12/15/14 0450  WBC 6.6 6.0 4.8 5.8 4.5  HGB 7.8* 7.8* 7.7* 8.1* 7.9*  HCT 25.5* 25.5* 25.6* 26.4* 26.0*  MCV 78.5 79.4 80.5 79.8 80.2   PLT 275 276 253 277 270   CBG:  Recent Labs Lab 12/11/14 0805 12/12/14 0743 12/13/14 0714 12/14/14 0735 12/15/14 0738  GLUCAP 106* 102* 103* 96 95    Recent Results (from the past 240 hour(s))  MRSA PCR Screening     Status: None   Collection Time: 12/07/14  4:34 AM  Result Value Ref Range Status   MRSA by PCR NEGATIVE NEGATIVE Final    Comment:        The GeneXpert MRSA Assay (FDA approved for NASAL specimens only), is one component of a comprehensive MRSA colonization surveillance program. It is not intended to diagnose MRSA infection nor to guide or monitor treatment for MRSA infections.      Scheduled Meds: . erythromycin  1 application Right Eye QHS  . fentaNYL      . multivitamin with minerals  1 tablet Oral Daily  . mycophenolate  500 mg Oral Daily  . sodium chloride  3 mL Intravenous Q12H   Continuous Infusions: . amiodarone 30 mg/hr (12/15/14 1025)  . dextrose 5 % and 0.9 % NaCl with KCl 20 mEq/L 100 mL/hr at 12/15/14 1427  . lactated ringers 1,000 mL (12/15/14 1258)

## 2014-12-15 NOTE — Anesthesia Procedure Notes (Signed)
Procedure Name: Intubation Date/Time: 12/15/2014 10:44 AM Performed by: Lollie Sails Pre-anesthesia Checklist: Patient identified, Emergency Drugs available, Suction available, Patient being monitored and Timeout performed Patient Re-evaluated:Patient Re-evaluated prior to inductionOxygen Delivery Method: Circle system utilized Preoxygenation: Pre-oxygenation with 100% oxygen Intubation Type: IV induction Ventilation: Mask ventilation without difficulty Laryngoscope Size: Miller and 3 Grade View: Grade I Tube type: Oral Number of attempts: 1 Airway Equipment and Method: Stylet Placement Confirmation: ETT inserted through vocal cords under direct vision,  positive ETCO2 and breath sounds checked- equal and bilateral Secured at: 22 cm Tube secured with: Tape Dental Injury: Teeth and Oropharynx as per pre-operative assessment

## 2014-12-15 NOTE — H&P (View-Only) (Signed)
2 Days Post-Op  Subjective: No complaints  Objective: Vital signs in last 24 hours: Temp:  [98 F (36.7 C)-98.3 F (36.8 C)] 98 F (36.7 C) (10/25 2044) Pulse Rate:  [54-79] 54 (10/25 2234) Resp:  [16] 16 (10/25 1317) BP: (99-118)/(48-63) 115/54 mmHg (10/25 2044) SpO2:  [95 %-99 %] 95 % (10/25 2044) Last BM Date: 12/12/14  Intake/Output from previous day: 10/25 0701 - 10/26 0700 In: 1224.9 [P.O.:1080; I.V.:144.9] Out: 2000 [Urine:2000] Intake/Output this shift:    Resp: clear to auscultation bilaterally Cardio: regular rate and rhythm GI: soft, non-tender; bowel sounds normal; no masses,  no organomegaly  Lab Results:   Recent Labs  12/12/14 0533 12/13/14 0505  WBC 4.8 5.8  HGB 7.7* 8.1*  HCT 25.6* 26.4*  PLT 253 277   BMET  Recent Labs  12/12/14 0533 12/13/14 0505  NA 140 139  K 4.4 4.4  CL 107 105  CO2 26 27  GLUCOSE 94 97  BUN 21* 16  CREATININE 1.00 1.05  CALCIUM 8.8* 8.9   PT/INR No results for input(s): LABPROT, INR in the last 72 hours. ABG No results for input(s): PHART, HCO3 in the last 72 hours.  Invalid input(s): PCO2, PO2  Studies/Results: No results found.  Anti-infectives: Anti-infectives    None      Assessment/Plan: s/p Procedure(s): COLONOSCOPY WITH PROPOFOL (N/A) Oncology says no other medical option. Cards says he is at moderate risk. I have discussed all this with the family and they agree that surgery seems like the best option. I will plan for a laparoscopic assisted partial colectomy. Risks and benefits of surgery as well as some of the technical aspects including the risk of leak were discussed with the patient and family and they understand and wish to proceed. Will bowel prep today and plan for surgery tomorrow  LOS: 7 days    TOTH III,Nevelyn Mellott S 12/14/2014

## 2014-12-15 NOTE — Progress Notes (Signed)
Updated SNF of surgery today- will await MD for a tentative dc date but plan is for dc to [facility] at dc.   Eduard Clos, MSW, [place]

## 2014-12-15 NOTE — Anesthesia Postprocedure Evaluation (Signed)
  Anesthesia Post-op Note  Patient: Jesse Burton  Procedure(s) Performed: Procedure(s) (LRB): Laparoscopic partial colectomy (N/A)  Patient Location: PACU  Anesthesia Type: General  Level of Consciousness: awake and alert   Airway and Oxygen Therapy: Patient Spontanous Breathing  Post-op Pain: mild  Post-op Assessment: Post-op Vital signs reviewed, Patient's Cardiovascular Status Stable, Respiratory Function Stable, Patent Airway and No signs of Nausea or vomiting  Last Vitals:  Filed Vitals:   12/15/14 1345  BP: 149/74  Pulse: 70  Temp: 36.3 C  Resp: 16    Post-op Vital Signs: stable   Complications: No apparent anesthesia complications

## 2014-12-15 NOTE — Anesthesia Preprocedure Evaluation (Addendum)
Anesthesia Evaluation  Patient identified by MRN, date of birth, ID band Patient awake    Reviewed: Allergy & Precautions, NPO status , Patient's Chart, lab work & pertinent test results  Airway Mallampati: II  TM Distance: >3 FB Neck ROM: Full    Dental no notable dental hx. (+) Dental Advisory Given, Teeth Intact   Pulmonary neg pulmonary ROS, former smoker,    Pulmonary exam normal breath sounds clear to auscultation       Cardiovascular hypertension, Pt. on medications + CAD  Normal cardiovascular exam+ dysrhythmias Atrial Fibrillation  Rhythm:Regular Rate:Normal     Neuro/Psych  Neuromuscular disease negative psych ROS   GI/Hepatic negative GI ROS, Neg liver ROS,   Endo/Other  Hypothyroidism   Renal/GU negative Renal ROS  negative genitourinary   Musculoskeletal  (+) Arthritis ,   Abdominal   Peds negative pediatric ROS (+)  Hematology  (+) anemia , hgb 7.9   Anesthesia Other Findings   Reproductive/Obstetrics negative OB ROS                            Anesthesia Physical Anesthesia Plan  ASA: III  Anesthesia Plan: General   Post-op Pain Management:    Induction: Intravenous  Airway Management Planned: Oral ETT  Additional Equipment:   Intra-op Plan:   Post-operative Plan: Extubation in OR  Informed Consent:   Plan Discussed with: Surgeon  Anesthesia Plan Comments:         Anesthesia Quick Evaluation

## 2014-12-15 NOTE — Op Note (Signed)
12/07/2014 - 12/15/2014  12:31 PM  PATIENT:  Jesse Burton  81 y.o. male  PRE-OPERATIVE DIAGNOSIS:  COLON CANCER  POST-OPERATIVE DIAGNOSIS:  Right COLON CANCER  PROCEDURE:  Procedure(s): Laparoscopic right colectomy (N/A)  SURGEON:  Surgeon(s) and Role:    * Jovita Kussmaul, MD - Primary  PHYSICIAN ASSISTANT:   ASSISTANTS: Dr. Zella Richer   ANESTHESIA:   general  EBL:  Total I/O In: 0  Out: 200 [Urine:200]  BLOOD ADMINISTERED:none  DRAINS: none   LOCAL MEDICATIONS USED:  MARCAINE     SPECIMEN:  Source of Specimen:  right colon and terminal ileum  DISPOSITION OF SPECIMEN:  PATHOLOGY  COUNTS:  YES  TOURNIQUET:  * No tourniquets in log *  DICTATION: .Dragon Dictation   After informed consent was obtained the patient was brought to the operating room and placed in the supine position on the operating room table. After adequate induction of general anesthesia the patient was moved in the lithotomy position and all pressure points were padded. The abdomen was then prepped with ChloraPrep, allowed to dry, and draped in usual sterile manner and the perineum was prepped with Betadine. The area above the umbilicus was infiltrated with quarter percent Marcaine. A small incision was made with a 15 blade knife. The incision was carried through the subcutaneous tissue bluntly with a hemostat and [employer] retractors. The linea alba was identified and incised with the 15 blade knife. Each side was grasped with Coker clamps and elevated anteriorly. The preperitoneal space was probed bluntly with a hemostat until the peritoneum was opened and access was gained to the abdominal cavity. A 0 Vicryl pursestring stitch was placed in the fascia surrounding the opening. The abdomen was then insufflated with carbon dioxide without difficulty. The abdomen was inspected and the tattooing was identified in the right colon. Next a suprapubic 5 mm port was placed under direct vision and a another 5 mm  port was placed in the left upper quadrant also under direct vision. The right colon was then mobilized by incising its retroperitoneal attachments along the white line of Toldt with the Harmonic scalpel. The gastrocolic ligament was also taken down sharply with the Harmonic scalpel which allowed mobilization of the transverse colon. Next the incision above the umbilicus was extended superiorly a short distance. A Kary Kos was used to grab the right colon and the terminal ileum, right colon, and transverse colon were eviscerated through the incision. The terminal ileum was then divided with a single firing of the GIA-75 stapler. The transverse colon distal to the cancer was also divided with a single firing of the GIA-75 stapler. There was good blood supply to the remaining ends of small bowel and colon. The mesentery to the right colon was taken down sharply with the Harmonic scalpel. The major vessels were then clamped with Kelly clamps, divided, and ligated with 2-0 silk ties. The terminal ileum, right colon, and proximal transverse colon were then removed from the patient and sent pathology for further evaluation. The terminal ileum and transverse colon easily approximated each other. A small opening was made as expected on the antimesenteric border of each limb of bowel. A side of a GIA-75 stapler was then placed down the appropriate limb of small bowel or colon, clamped, and fired thereby creating a nice widely patent enteroenterostomy. The common opening was then closed as expected with a firing of the TA 60 stapler. A 2-0 silk crotch stitch was placed and the staple line was imbricated with  interrupted 2-0 silk Lembert stitches. The mesenteric defect was then closed with interrupted 2-0 silk stitches. The anastomosis was then placed back in the abdominal cavity. The anastomosis was healthy and patent with good blood supply and was not under any tension. The abdomen was then irrigated with copious amounts of  saline. All gowns and gloves were changed and new drapes were applied. The fascial defect was then closed with 2 #1 double-stranded loop PDS sutures. The subcutaneous tissue was irrigated with saline. The skin was closed with staples. The patient tolerated the procedure well. At the end of the case all needle sponge and instrument counts were correct. The patient was then awakened and taken to recovery in stable condition.  PLAN OF CARE: Admit to inpatient   PATIENT DISPOSITION:  PACU - hemodynamically stable.   Delay start of Pharmacological VTE agent (>24hrs) due to surgical blood loss or risk of bleeding: no

## 2014-12-15 NOTE — Transfer of Care (Signed)
Immediate Anesthesia Transfer of Care Note  Patient: Jesse Burton  Procedure(s) Performed: Procedure(s): Laparoscopic partial colectomy (N/A)  Patient Location: PACU  Anesthesia Type:General  Level of Consciousness: sedated  Airway & Oxygen Therapy: Patient Spontanous Breathing and Patient connected to face mask oxygen  Post-op Assessment: Report given to RN and Post -op Vital signs reviewed and stable  Post vital signs: Reviewed and stable  Last Vitals:  Filed Vitals:   12/15/14 0859  BP: 109/59  Pulse: 63  Temp: 36.8 C  Resp: 16    Complications: No apparent anesthesia complications

## 2014-12-15 NOTE — Progress Notes (Signed)
PT Cancellation Note  Patient Details Name: Jesse Burton MRN: [id number] DOB: 1925/04/08   Cancelled Treatment:    Reason Eval/Treat Not Completed: Other (comment) (pt sched for surgery this am)   [facility] 12/15/2014, 9:33 AM

## 2014-12-15 NOTE — Care Management Important Message (Signed)
Important Message  Patient Details  Name: Jesse Burton MRN: [id number] Date of Birth: 1925/09/20   Medicare Important Message Given:  Yes-third notification given    Shelda Altes 12/15/2014, 2:37 PM

## 2014-12-16 DIAGNOSIS — I482 Chronic atrial fibrillation: Secondary | ICD-10-CM

## 2014-12-16 LAB — GLUCOSE, CAPILLARY
GLUCOSE-CAPILLARY: 149 mg/dL — AB (ref 65–99)
GLUCOSE-CAPILLARY: 109 mg/dL — AB (ref 65–99)
GLUCOSE-CAPILLARY: 129 mg/dL — AB (ref 65–99)
Glucose-Capillary: 146 mg/dL — ABNORMAL HIGH (ref 65–99)

## 2014-12-16 LAB — BASIC METABOLIC PANEL
ANION GAP: 6 (ref 5–15)
BUN: 11 mg/dL (ref 6–20)
CHLORIDE: 111 mmol/L (ref 101–111)
CO2: 23 mmol/L (ref 22–32)
CREATININE: 1.06 mg/dL (ref 0.61–1.24)
Calcium: 9.1 mg/dL (ref 8.9–10.3)
GFR calc non Af Amer: >60 mL/min (ref >60)
Glucose, Bld: 152 mg/dL — ABNORMAL HIGH (ref 65–99)
POTASSIUM: 4.3 mmol/L (ref 3.5–5.1)
SODIUM: 140 mmol/L (ref 135–145)

## 2014-12-16 LAB — CBC
HEMATOCRIT: 27.6 % — AB (ref 39.0–52.0)
HEMOGLOBIN: 8.4 g/dL — AB (ref 13.0–17.0)
MCH: 24.3 pg — ABNORMAL LOW (ref 26.0–34.0)
MCHC: 30.4 g/dL (ref 30.0–36.0)
MCV: 79.8 fL (ref 78.0–100.0)
Platelets: 336 10*3/uL (ref 150–400)
RBC: 3.46 MIL/uL — AB (ref 4.22–5.81)
RDW: 18.7 % — ABNORMAL HIGH (ref 11.5–15.5)
WBC: 15.3 10*3/uL — ABNORMAL HIGH (ref 4.0–10.5)

## 2014-12-16 LAB — HEMOGLOBIN A1C
Hgb A1c MFr Bld: 5.9 % — ABNORMAL HIGH (ref 4.8–5.6)
MEAN PLASMA GLUCOSE: 123 mg/dL

## 2014-12-16 MED ORDER — CARVEDILOL 3.125 MG PO TABS
3.1250 mg | ORAL_TABLET | Freq: Two times a day (BID) | ORAL | Status: DC
  Administered 2014-12-18 – 2014-12-21 (×7): 3.125 mg via ORAL
  Filled 2014-12-16 (×7): qty 1

## 2014-12-16 MED ORDER — LEVALBUTEROL HCL 0.63 MG/3ML IN NEBU: 0.6300 mg | INHALATION_SOLUTION | Freq: Four times a day (QID) | RESPIRATORY_TRACT | Status: DC | PRN

## 2014-12-16 MED ORDER — METOPROLOL TARTRATE 1 MG/ML IV SOLN
5.0000 mg | Freq: Four times a day (QID) | INTRAVENOUS | Status: DC | PRN
  Administered 2014-12-16: 5 mg via INTRAVENOUS
  Filled 2014-12-16: qty 5

## 2014-12-16 MED ORDER — FUROSEMIDE 10 MG/ML IJ SOLN
20.0000 mg | Freq: Once | INTRAMUSCULAR | Status: AC
  Administered 2014-12-16: 20 mg via INTRAVENOUS
  Filled 2014-12-16: qty 2

## 2014-12-16 NOTE — Progress Notes (Signed)
1 Day Post-Op  Subjective: Happily confused. No complaints  Objective: Vital signs in last 24 hours: Temp:  [97.3 F (36.3 C)-98.8 F (37.1 C)] 98.1 F (36.7 C) (10/28 0400) Pulse Rate:  [54-113] 86 (10/28 0400) Resp:  [16-23] 20 (10/28 0400) BP: (138-163)/(66-91) 149/91 mmHg (10/28 0400) SpO2:  [89 %-97 %] 97 % (10/28 0400) Weight:  [85.866 kg (189 lb 4.8 oz)] 85.866 kg (189 lb 4.8 oz) (10/28 0400) Last BM Date: 12/15/14  Intake/Output from previous day: 10/27 0701 - 10/28 0700 In: 3734.7 [I.V.:3734.7] Out: 830 [Urine:830] Intake/Output this shift:    Resp: clear to auscultation bilaterally Cardio: regular rate and rhythm GI: soft, nontender. few bs. incision ok  Lab Results:   Recent Labs  12/15/14 0450 12/16/14 0551  WBC 4.5 15.3*  HGB 7.9* 8.4*  HCT 26.0* 27.6*  PLT 270 336   BMET  Recent Labs  12/16/14 0551  NA 140  K 4.3  CL 111  CO2 23  GLUCOSE 152*  BUN 11  CREATININE 1.06  CALCIUM 9.1   PT/INR No results for input(s): LABPROT, INR in the last 72 hours. ABG No results for input(s): PHART, HCO3 in the last 72 hours.  Invalid input(s): PCO2, PO2  Studies/Results: No results found.  Anti-infectives: Anti-infectives    Start     Dose/Rate Route Frequency Ordered Stop   12/15/14 1045  cefOXitin (MEFOXIN) 2 g in dextrose 5 % 50 mL IVPB     2 g 100 mL/hr over 30 Minutes Intravenous  Once 12/15/14 1014 12/15/14 1045      Assessment/Plan: s/p Procedure(s): Laparoscopic partial colectomy (N/A) continue ng and bowel rest  OOB  D/c foley tomorrow  LOS: 9 days    TOTH III,Cannon Quinton S 12/16/2014

## 2014-12-16 NOTE — Progress Notes (Signed)
Patient ID: Handy Mcloud, male   DOB: [birth year], 81 y.o.   MRN: [id number]     Patient Name: Jesse Burton Date of Encounter: 12/16/2014  Principal Problem:   Colonic mass Active Problems:   Guaiac positive stools   Microcytic anemia   Arthritis   Hypertension   Hypothyroidism   Retinitis   GIB (gastrointestinal bleeding)   Abdominal distention   Essential hypertension   A-fib (HCC)   Iron deficiency anemia due to chronic blood loss   Melena   Anemia, iron deficiency   Heme positive stool   Encounter for monitoring antiplatelet therapy   Colon cancer ([place]) - suspect transverse colon   Hx of CABG   Pre-operative cardiovascular examination    Primary Cardiologist: Dr. Mare Ferrari - 10+ years ago; Now Dr. Johnsie Cancel Patient Profile: 81 y.o. male with past medical history of HTN, HLD, Hypothyroidism, CAD (s/p CABG 1991), Paroxysmal Atrial Fibrillation (not on anticoagulation), and dementia (with sundowning) who presented to [place] ED on 12/07/2014 with a hemoglobin of 4.5 found to have a non-obstructing mass in the transverse colon. Initially consulted for preoperative clearance.  SUBJECTIVE: no complaints post op day one   OBJECTIVE Filed Vitals:   12/15/14 1345 12/15/14 1851 12/15/14 2109 12/16/14 0400  BP: 149/74 138/81 163/78 149/91  Pulse: 70 91 86 86  Temp: 97.3 F (36.3 C) 98.3 F (36.8 C) 98.8 F (37.1 C) 98.1 F (36.7 C)  TempSrc:  Oral Oral Oral  Resp: 16 18 20 20   Height:      Weight:    85.866 kg (189 lb 4.8 oz)  SpO2: 95% 97% 97% 97%    Intake/Output Summary (Last 24 hours) at 12/16/14 0842 Last data filed at 12/16/14 0700  Gross per 24 hour  Intake 3734.69 ml  Output    830 ml  Net 2904.69 ml   Filed Weights   12/13/14 0559 12/15/14 0353 12/16/14 0400  Weight: 84.278 kg (185 lb 12.8 oz) 86.6 kg (190 lb 14.7 oz) 85.866 kg (189 lb 4.8 oz)    PHYSICAL EXAM General: Well developed, well nourished, male in no acute distress. Head:  Normocephalic, atraumatic.  Neck: Supple without bruits, JVD not elevated. Lungs:  Resp regular and unlabored, CTA without wheezing or rales. Heart: Irregularly irregular, S1, S2, no S3, S4, or murmur; no rub. Abdomen: S/P lap choly with NG tube in place  Extremities: No clubbing, cyanosis, or edema. Distal pedal pulses are 2+ bilaterally. Neuro: Alert and oriented X 3. Moves all extremities spontaneously. Psych: Normal affect. Dementia   LABS: CBC:  Recent Labs  12/15/14 0450 12/16/14 0551  WBC 4.5 15.3*  HGB 7.9* 8.4*  HCT 26.0* 27.6*  MCV 80.2 79.8  PLT 270 073   Basic Metabolic Panel:  Recent Labs  12/16/14 0551  NA 140  K 4.3  CL 111  CO2 23  GLUCOSE 152*  BUN 11  CREATININE 1.06  CALCIUM 9.1   BNP:  B NATRIURETIC PEPTIDE  Date/Time Value Ref Range Status  12/07/2014 04:23 AM 630.8* 0.0 - 100.0 pg/mL Final    TELE:  Atrial fibrillation with rate in 50's - 70's. Questionable P-waves at times?       Current Medications:  . erythromycin  1 application Right Eye QHS  . multivitamin with minerals  1 tablet Oral Daily  . mycophenolate  500 mg Oral Daily  . sodium chloride  3 mL Intravenous Q12H   . amiodarone 30 mg/hr (12/15/14 2227)  .  dextrose 5 % and 0.9 % NaCl with KCl 20 mEq/L 100 mL/hr at 12/16/14 0008  . lactated ringers Stopped (12/15/14 1700)    ASSESSMENT AND PLAN:  1. Preoperative Risk Assessment for Partial Colectomy - Distant CAD CABG 91 no angina preop doing well  With no signs of ischemia   2. Chronic Atrial Fibrillation -This patients CHA2DS2-VASc Score and unadjusted Ischemic Stroke Rate (% per year) is equal to 4.8 % stroke rate/year from a score of 4 (HTN, MI, Age > 75x2). Was not on anticoagulation prior to admission. Would not be candidate currently due to GI bleed. - continue beta blocker for rate contorl   3. History of CAD - s/p CABG 1991, with no subsequent interventions since. - Plavix has been held in setting of GI  Bleed.  Jenkins Rouge

## 2014-12-16 NOTE — Progress Notes (Signed)
Fredirick Maudlin, NP on call notified of pt's increased work of breathing and O2 sat 83% on 4L. RN also informed NP that pt's lungs sounded wet and crackly. On call NP ordered 20 mg of Lasix. Will carry out plan of care and continue to monitor pt closely. Carnella Guadalajara I

## 2014-12-16 NOTE — Progress Notes (Signed)
Physical Therapy Treatment Patient Details Name: Jesse Burton MRN: [id number] DOB: [birth year] Today's Date: 12/16/2014    History of Present Illness 81 y.o. male admitted with GI bleed, anemia. Hx of dementia, Afib, HTN. Pt is from [facility]-long term care.    PT Comments    Was able to get patient to EOB from supine via log roll; had to attempt log roll twice as first time patient started to flex trunk to sit up causing pain/yelling; second long roll with assist and still grimacing/yelling in pain; sat patient up on EVA and walked down the hall with 2 assist min-mod as patient has some stability once standing; ambulated 160 ft, brought back to [facility] - no BM - ended treatment with pt. In recliner.   Follow Up Recommendations  SNF     Equipment Recommendations  None recommended by PT    Recommendations for Other Services       Precautions / Restrictions Precautions Precautions: Fall Restrictions Weight Bearing Restrictions: No    Mobility  Bed Mobility Overal bed mobility: Needs Assistance;+2 for physical assistance Bed Mobility: Rolling;Sidelying to Sit Rolling: Mod assist;Max assist Sidelying to sit: Mod assist;Max assist       General bed mobility comments: mod to max assist with log roll and sidelying to sit for placement of extremities and physical assistance of movement   Transfers Overall transfer level: Needs assistance Equipment used: Bilateral platform walker (EVA walker ) Transfers: Sit to/from Stand Sit to Stand: Mod assist;Max assist;+2 physical assistance Stand pivot transfers: Mod assist;Max assist       General transfer comment: assist to rise and stabilize/handplacement on seated surface   Ambulation/Gait Ambulation/Gait assistance: Mod assist;Min assist Ambulation Distance (Feet): 160 Feet (80 feet x 2 one sitting rest break) Assistive device: Bilateral platform walker Gait Pattern/deviations: Trunk flexed;Narrow base of support;Decreased  stride length;Decreased step length - right;Decreased step length - left;Step-to pattern     General Gait Details: pt. bears weight through UE on EVA, taking small and slow steps with flexed posture   Stairs            Wheelchair Mobility    Modified Rankin (Stroke Patients Only)       Balance                                    Cognition Arousal/Alertness: Awake/alert;Suspect due to medications Behavior During Therapy: [facility] for tasks assessed/performed Overall Cognitive Status: History of cognitive impairments - at baseline (needs processing time for commands )                      Exercises      General Comments        Pertinent Vitals/Pain Pain Assessment: Faces Faces Pain Scale: Hurts little more (4 for walking, 6 or 8 for bed mob and STS ) Pain Location: abdomen  Pain Descriptors / Indicators:  (yelling ) Pain Intervention(s): Limited activity within patient's tolerance;Monitored during session;Premedicated before session    Home Living                      Prior Function            PT Goals (current goals can now be found in the care plan section) Acute Rehab PT Goals Patient Stated Goal: none stated Time For Goal Achievement: 12/22/14 Potential to Achieve Goals: Fair Progress towards  PT goals: Progressing toward goals    Frequency  Min 3X/week    PT Plan      Co-evaluation             End of Session Equipment Utilized During Treatment: Gait belt Activity Tolerance: Patient tolerated treatment well;Patient limited by pain Patient left: in chair;with chair alarm set;with call bell/phone within reach;with family/visitor present;with nursing/sitter in room     Time:  - 11:05 - 11:45    Charges:    2 gt    1 ta                   G CodesDenna Haggard 12/16/2014, 1:18 PM   reviewed and agree with above Rica Koyanagi  PTA [facility]  Acute  Rehab Pager      [phone]

## 2014-12-16 NOTE — Progress Notes (Signed)
Patient ID: Jesse Burton, male   DOB: 1925/11/19, 81 y.o.   MRN: [id number]  [facility] PROGRESS NOTE  Mohit Zirbes BSJ:[id number] DOB: 03-31-1925 DOA: 12/07/2014 PCP: No primary care provider on file.   Brief narrative:     81 y.o. male with PMH of Dementia, hypertension, hyperlipidemia, hypothyroidism, arthritis, CAD/CABG, retinitis, BPH, atrial fibrillation (not on anticoagulation), who presented with low hemoglobin. He was brought in ED after he was found to have low Hgb of 4.5 in [facility]. Since admission, seen by GI, plavix was stopped and received 3 units PRBC since then his Hb has been stable in the 7.8 range, underwent Colonoscopy 10/24-transverse colon mass. On 10/25 [facility] was consulted.  Assessment/Plan:    Iron deficiency anemia/Colon mass - held plavix, s/p 3 units PRBC on day 1, Plavix since held - GI following, underwent colonoscopy on 10/24 was remarkable for a transverse colon mass which is likely source of bleeding - [facility] was consulted, underwent laparoscopic right colectomy 10/27 - Pathology pos for invasive adenocarcinoma  - Oncology consulted with recs for surgery with no role in systemic chemo or radiation therapy  Dementia with sundowning - avoid Benzos for delirium, Cont low dose Haldol PRN if needed, re-orientation techniques  Acute on chronic Diastolic CHF - improved with diuresis, ECHO with preserved EF - cont with PO lasix as tolerated  CAD:  - s/p of CABG in 1991 - stable, holding plavix   Arthritis: - Continued tylenol and tramadol if needed   Retinitis: - Continued on cellcept - continue home eye drops   Atrial Fibrillation:  - CHA2DS2-VASc Score is 3, which indicates pt would normally benefit from anticoagulation, however, pt was not on anticoagulation at home, likely secondary to old age and high risk of fall.  - HR improved now, was uncontrolled 10/20 afternoon and started on Metoprolol - continue amiodarone    HLD:  -Continue Lipitor  Hypothyroidism:  -Continue Synthroid  BPH:  - stable - Continue Flomax  DVT prophylaxis - SCD's  Code Status: Full.  Family Communication:  plan of care discussed with the patient and daughter at bedside  Disposition Plan: Home when stable.   IV access:  Peripheral IV  Procedures and diagnostic studies:    Ct Abdomen Pelvis W Contrast  12/07/2014  CLINICAL DATA:  Dark stools and abdominal distention. EXAM: CT ABDOMEN AND PELVIS WITH CONTRAST TECHNIQUE: Multidetector CT imaging of the abdomen and pelvis was performed using the standard protocol following bolus administration of intravenous contrast. CONTRAST:  165mL OMNIPAQUE IOHEXOL 300 MG/ML  SOLN COMPARISON:  None. FINDINGS: Normal hepatic contour. The examination is minimally degraded secondary exclusion of the dome of the diaphragm. No discrete hepatic lesions. There are multiple layering laminated gallstones seen throughout the gallbladder. This finding is associated with a potential very minimal amount of gallbladder wall thickening and pericholecystic fluid though this may be accentuated due to a minimal amount of third spacing within the abdomen. No definite intra or extrahepatic bili duct dilatation. No ascites. There is symmetric enhancement and excretion of the bilateral kidneys. No definite renal stones in this postcontrast examination. No discrete renal lesions. There is a minimal amount of bilateral perinephric stranding, likely age and body habitus related. No urinary obstruction. Normal appearance of the bilateral adrenal glands, pancreas and spleen. Ingested enteric contrast extends to the level of the distal transverse colon. Moderate colonic stool burden without evidence of enteric obstruction. The bowel is otherwise normal in course and caliber  without wall thickening. Normal appearance of the terminal ileum. There is is a minimal amount of stranding throughout the root of the abdominal  mesenteric including the right lower abdominal quadrant surrounding the appendix without focality and favored to be secondary to third spacing/volume overload. Otherwise, normal appearance of the appendix. Moderate amount of mixed calcified and noncalcified atherosclerotic plaque within a normal caliber abdominal aorta. The major branch vessels of the abdominal aorta appear patent on this non CTA examination. 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 No bulky retroperitoneal, mesenteric, pelvic or inguinal lymphadenopathy. The prostate is borderline enlarged with mass effect on the undersurface urinary bladder. Otherwise normal appearance of the urinary bladder given degree distention. There is a small amount of fluid seen within the pelvic cul-de-sac. Limited visualization of the lower thorax is degraded secondary to patient respiratory artifact. Trace bilateral pleural effusions, right greater than left, with associated dependent subpleural opacities, likely atelectasis. Cardiomegaly. Coronary artery calcifications. No pericardial effusion. No acute or aggressive osseous abnormalities. Mild-to-moderate multilevel lumbar spine DDD, worse at L2-L3 with disc space height loss, endplate irregularity and sclerosis. Mild diffuse body wall anasarca. Regional soft tissues appear otherwise normal. IMPRESSION: 1. Cardiomegaly with findings worrisome for pulmonary edema including trace bilateral effusions, right greater than left, mild diffuse body wall anasarca and minimal amount of mesenteric stranding / third-spacing throughout the peritoneum. Clinical correlation is advised. 2. Cholelithiasis with potential minimal amount of gallbladder wall thickening and pericholecystic fluid, again, potentially attributable to suspected third-spacing within the peritoneal cavity - if clinical concern persists for acute cholecystitis, further evaluation with gallbladder  ultrasound and/or nuclear medicine HIDA scan could be performed as clinically indicated. 3. Coronary artery calcifications. 4. Prostatomegaly. Electronically Signed   By: Sandi Mariscal M.D.   On: 12/07/2014 11:43    Medical Consultants:   Cardiology  Oncology  General Surgery  GI  Other Consultants:   None  IAnti-Infectives:    None  Faye Ramsay, MD  [facility] Pager [phone]  If 7PM-7AM, please contact night-coverage [email or web address] Password [email or web address] 12/16/2014, 2:31 PM   LOS: 9 days   HPI/Subjective: No events overnight.   Objective: Filed Vitals:   12/15/14 1851 12/15/14 2109 12/16/14 0400 12/16/14 1233  BP: 138/81 163/78 149/91 154/91  Pulse: 91 86 86 94  Temp: 98.3 F (36.8 C) 98.8 F (37.1 C) 98.1 F (36.7 C) 98.6 F (37 C)  TempSrc: Oral Oral Oral Oral  Resp: 18 20 20 20   Height:      Weight:   85.866 kg (189 lb 4.8 oz)   SpO2: 97% 97% 97% 95%    Intake/Output Summary (Last 24 hours) at 12/16/14 1431 Last data filed at 12/16/14 1236  Gross per 24 hour  Intake 2634.69 ml  Output   1000 ml  Net 1634.69 ml    Exam:   General:  Pt is alert, confused, follows commands appropriately, not in acute distress  Cardiovascular: Irregular rate and rhythm, no rubs, no gallops  Respiratory: Clear to auscultation bilaterally, no wheezing, no crackles, no rhonchi  Abdomen: Soft, non tender, non distended, bowel sounds present, no guarding  Data Reviewed: Basic Metabolic Panel:  Recent Labs Lab 12/10/14 0555 12/11/14 0510 12/12/14 0533 12/13/14 0505 12/16/14 0551  NA 140 137 140 139 140  K 4.0 4.3 4.4 4.4 4.3  CL 107 107 107 105 111  CO2 28 25 26 27 23   GLUCOSE 103* 101* 94 97 152*  BUN  25* 26* 21* 16 11  CREATININE 1.08 1.22 1.00 1.05 1.06  CALCIUM 8.7* 8.8* 8.8* 8.9 9.1   CBC:  Recent Labs Lab 12/11/14 0510 12/12/14 0533 12/13/14 0505 12/15/14 0450 12/16/14 0551  WBC 6.0 4.8 5.8 4.5 15.3*  HGB 7.8* 7.7* 8.1* 7.9* 8.4*  HCT 25.5*  25.6* 26.4* 26.0* 27.6*  MCV 79.4 80.5 79.8 80.2 79.8  PLT 276 253 277 270 336   CBG:  Recent Labs Lab 12/15/14 0738 12/15/14 1649 12/15/14 2005 12/16/14 0727 12/16/14 1147  GLUCAP 95 199* 215* 149* 109*    Recent Results (from the past 240 hour(s))  MRSA PCR Screening     Status: None   Collection Time: 12/07/14  4:34 AM  Result Value Ref Range Status   MRSA by PCR NEGATIVE NEGATIVE Final    Comment:        The GeneXpert MRSA Assay (FDA approved for NASAL specimens only), is one component of a comprehensive MRSA colonization surveillance program. It is not intended to diagnose MRSA infection nor to guide or monitor treatment for MRSA infections.      Scheduled Meds: . carvedilol  3.125 mg Oral BID WC  . erythromycin  1 application Right Eye QHS  . multivitamin with minerals  1 tablet Oral Daily  . mycophenolate  500 mg Oral Daily  . sodium chloride  3 mL Intravenous Q12H   Continuous Infusions: . amiodarone 30 mg/hr (12/16/14 1119)  . dextrose 5 % and 0.9 % NaCl with KCl 20 mEq/L 100 mL/hr at 12/16/14 1046  . lactated ringers Stopped (12/15/14 1700)

## 2014-12-17 ENCOUNTER — Inpatient Hospital Stay (HOSPITAL_COMMUNITY): Payer: Medicare Other

## 2014-12-17 DIAGNOSIS — I5033 Acute on chronic diastolic (congestive) heart failure: Secondary | ICD-10-CM

## 2014-12-17 DIAGNOSIS — I10 Essential (primary) hypertension: Secondary | ICD-10-CM

## 2014-12-17 LAB — URINALYSIS, ROUTINE W REFLEX MICROSCOPIC
Bilirubin Urine: NEGATIVE
GLUCOSE, UA: NEGATIVE mg/dL
Ketones, ur: NEGATIVE mg/dL
Nitrite: NEGATIVE
Protein, ur: NEGATIVE mg/dL
SPECIFIC GRAVITY, URINE: 1.005 (ref 1.005–1.030)
Urobilinogen, UA: 0.2 mg/dL (ref 0.0–1.0)
pH: 6.5 (ref 5.0–8.0)

## 2014-12-17 LAB — BASIC METABOLIC PANEL
Anion gap: 12 (ref 5–15)
BUN: 11 mg/dL (ref 6–20)
CALCIUM: 9.3 mg/dL (ref 8.9–10.3)
CHLORIDE: 107 mmol/L (ref 101–111)
CO2: 25 mmol/L (ref 22–32)
CREATININE: 1.07 mg/dL (ref 0.61–1.24)
GFR calc non Af Amer: >60 mL/min (ref >60)
Glucose, Bld: 123 mg/dL — ABNORMAL HIGH (ref 65–99)
Potassium: 4 mmol/L (ref 3.5–5.1)
SODIUM: 144 mmol/L (ref 135–145)

## 2014-12-17 LAB — GLUCOSE, CAPILLARY: GLUCOSE-CAPILLARY: 120 mg/dL — AB (ref 65–99)

## 2014-12-17 LAB — CBC
HCT: 29.7 % — ABNORMAL LOW (ref 39.0–52.0)
HEMOGLOBIN: 8.9 g/dL — AB (ref 13.0–17.0)
MCH: 23.9 pg — AB (ref 26.0–34.0)
MCHC: 30 g/dL (ref 30.0–36.0)
MCV: 79.6 fL (ref 78.0–100.0)
PLATELETS: 352 10*3/uL (ref 150–400)
RBC: 3.73 MIL/uL — AB (ref 4.22–5.81)
RDW: 19 % — ABNORMAL HIGH (ref 11.5–15.5)
WBC: 14.9 10*3/uL — AB (ref 4.0–10.5)

## 2014-12-17 LAB — BRAIN NATRIURETIC PEPTIDE: B Natriuretic Peptide: 877.1 pg/mL — ABNORMAL HIGH (ref 0.0–100.0)

## 2014-12-17 LAB — URINE MICROSCOPIC-ADD ON

## 2014-12-17 MED ORDER — FUROSEMIDE 10 MG/ML IJ SOLN
40.0000 mg | Freq: Once | INTRAMUSCULAR | Status: AC
  Administered 2014-12-17: 40 mg via INTRAVENOUS
  Filled 2014-12-17: qty 4

## 2014-12-17 NOTE — Progress Notes (Addendum)
2 Days Post-Op  Subjective: Feels like he wants to have bm but not yet, otherwise confused, pleasant  Objective: Vital signs in last 24 hours: Temp:  [97.6 F (36.4 C)-98.6 F (37 C)] 97.6 F (36.4 C) (10/29 0521) Pulse Rate:  [94-116] 95 (10/29 0521) Resp:  [20] 20 (10/29 0521) BP: (153-172)/(76-111) 153/80 mmHg (10/29 0521) SpO2:  [92 %-98 %] 96 % (10/29 0521) Weight:  [86.5 kg (190 lb 11.2 oz)] 86.5 kg (190 lb 11.2 oz) (10/29 0521) Last BM Date: 12/15/14  Intake/Output from previous day: 10/28 0701 - 10/29 0700 In: 334 [I.V.:334] Out: 2400 [Urine:2400] Intake/Output this shift: Total I/O In: -  Out: 1000 [Urine:1000]  GI: soft some bs wounds clean  Lab Results:   Recent Labs  12/16/14 0551 12/17/14 0508  WBC 15.3* 14.9*  HGB 8.4* 8.9*  HCT 27.6* 29.7*  PLT 336 352   BMET  Recent Labs  12/16/14 0551 12/17/14 0508  NA 140 144  K 4.3 4.0  CL 111 107  CO2 23 25  GLUCOSE 152* 123*  BUN 11 11  CREATININE 1.06 1.07  CALCIUM 9.1 9.3   PT/INR No results for input(s): LABPROT, INR in the last 72 hours. ABG No results for input(s): PHART, HCO3 in the last 72 hours.  Invalid input(s): PCO2, PO2  Studies/Results: Dg Chest 2 View  12/17/2014  CLINICAL DATA:  Mid chest pain for 1 day EXAM: CHEST  2 VIEW COMPARISON:  Radiograph 04/17/2012 FINDINGS: NG tube with tip at the gastroesophageal junction. Recommend advancement. Cardiac silhouette is mildly enlarged compared to prior. There is new perihilar airspace disease bilateral with moderate effusions. There is a RIGHT suprahilar opacity which likely represents airspace disease. IMPRESSION: 1. Cardiomegaly with bilateral central airspace disease and moderate effusions suggest congestive heart failure. Cannot exclude multifocal pneumonia. 2. RIGHT suprahilar density likely represents airspace disease. Recommend follow-up radiographs to ensure resolution. 3. NG tube terminates at the GE junction.  Recommend advancement.  Electronically Signed   By: Suzy Bouchard M.D.   On: 12/17/2014 09:24    Anti-infectives: Anti-infectives    Start     Dose/Rate Route Frequency Ordered Stop   12/15/14 1045  cefOXitin (MEFOXIN) 2 g in dextrose 5 % 50 mL IVPB     2 g 100 mL/hr over 30 Minutes Intravenous  Once 12/15/14 1014 12/15/14 1045      Assessment/Plan: POD 2 lap colectomy  Will dc ng today, no output, not being measured at all either has some bs Advance diet when bowel function returns Pharm dvt proph is fine from surgical standpoint if primary team wants to start that  [facility] 12/17/2014

## 2014-12-17 NOTE — Progress Notes (Addendum)
Patient ID: [facility], male   DOB: 15-Jun-1925, 81 y.o.   MRN: [id number]  [facility] PROGRESS NOTE  [facility] NKN:[id number] DOB: [birth year] DOA: 12/07/2014 PCP: No primary care provider on file.   Brief narrative:     81 y.o. male with PMH of Dementia, hypertension, hyperlipidemia, hypothyroidism, arthritis, CAD/CABG, retinitis, BPH, atrial fibrillation (not on anticoagulation), who presented with low hemoglobin. He was brought in ED after he was found to have low Hgb of 4.5 in [facility]. Since admission, seen by GI, plavix was stopped and received 3 units PRBC since then his Hb has been stable in the 7.8 range, underwent Colonoscopy 10/24-transverse colon mass. On 10/25 [facility] was consulted.  Assessment/Plan:    Iron deficiency anemia/Colon mass - held plavix, s/p 3 units PRBC on day 1, Plavix since held - GI following, underwent colonoscopy on 10/24, noted transverse colon mass likely source of bleeding - underwent laparoscopic right colectomy 10/27, confused post op but clinically stable  - Pathology pos for invasive adenocarcinoma  - Oncology consulted with recs for surgery with no role in systemic chemo or radiation therapy  Leukocytosis - post op related, no signs of an infectious etiology at this time - pt is however not reliable historian so will go ahead and get UA and CXR to rule out PNA or UTI - CBC in AM - hold of ABX for now   Acute Dementia with sundowning - Cont low dose Haldol PRN if needed, re-orientation techniques  Acute on chronic Diastolic CHF with hypoxia - more cackles on exam this AM with hypoxia overnight - ECHO with preserved EF - per cardio, lasix IV and monitor clinical response  - IVF stopped 10/28  CAD:  - s/p of CABG in 1991 - stable, holding plavix   Arthritis: - Continued tylenol and tramadol if needed   Retinitis: - Continued on cellcept - continue home eye drops   Atrial Fibrillation:  -  CHA2DS2-VASc Score is 3, which indicates pt would normally benefit from anticoagulation, however, pt was not on anticoagulation at home, likely secondary to old age and high risk of fall.  - HR improved now, was uncontrolled 10/20 afternoon and started on Metoprolol, will add Metoprolol as needed  - continue amiodarone   HLD:  -Continue Lipitor  Hypothyroidism:  -Continue Synthroid  BPH:  - stable - Continue Flomax  DVT prophylaxis - SCD's  Code Status: Full.  Family Communication:  plan of care discussed with the patient and daughter at bedside  Disposition Plan: Home when stable.   IV access:  Peripheral IV  Procedures and diagnostic studies:    Ct Abdomen Pelvis W Contrast 12/07/2014  Cardiomegaly with findings worrisome for pulmonary edema including trace bilateral effusions, right greater than left, mild diffuse body wall anasarca and minimal amount of mesenteric stranding / third-spacing throughout the peritoneum. Clinical correlation is advised. 2. Cholelithiasis with potential minimal amount of gallbladder wall thickening and pericholecystic fluid, again, potentially attributable to suspected third-spacing within the peritoneal cavity - if clinical concern persists for acute cholecystitis, further evaluation with gallbladder ultrasound and/or nuclear medicine HIDA scan could be performed as clinically indicated. 3. Coronary artery calcifications. 4. Prostatomegaly.   Medical Consultants:   Cardiology  Oncology  General Surgery  GI  Other Consultants:   None  IAnti-Infectives:    None  Faye Ramsay, MD  [facility] Pager [phone]  If 7PM-7AM, please contact night-coverage [email or web address] Password [email or web address] 12/17/2014, 6:16 AM   LOS:  10 days   HPI/Subjective: No events overnight.   Objective: Filed Vitals:   12/16/14 2029 12/16/14 2100 12/16/14 2149 12/17/14 0521  BP: 159/111 172/76  153/80  Pulse: 116 104  95  Temp: 98.4 F (36.9 C)   97.6 F (36.4  C)  TempSrc: Oral   Oral  Resp: 20   20  Height:      Weight:      SpO2: 98%  92% 96%    Intake/Output Summary (Last 24 hours) at 12/17/14 0616 Last data filed at 12/17/14 0503  Gross per 24 hour  Intake  800.8 ml  Output   2400 ml  Net -1599.2 ml    Exam:   General:  Pt is alert, confused, NAD  Cardiovascular: Irregular rate and rhythm, tachycardic, no rubs, no gallops  Respiratory: Clear to auscultation bilaterally, crackles at bases   Abdomen: Soft, non tender, non distended, bowel sounds present, no guarding  Data Reviewed: Basic Metabolic Panel:  Recent Labs Lab 12/11/14 0510 12/12/14 0533 12/13/14 0505 12/16/14 0551  NA 137 140 139 140  K 4.3 4.4 4.4 4.3  CL 107 107 105 111  CO2 25 26 27 23   GLUCOSE 101* 94 97 152*  BUN 26* 21* 16 11  CREATININE 1.22 1.00 1.05 1.06  CALCIUM 8.8* 8.8* 8.9 9.1   CBC:  Recent Labs Lab 12/11/14 0510 12/12/14 0533 12/13/14 0505 12/15/14 0450 12/16/14 0551  WBC 6.0 4.8 5.8 4.5 15.3*  HGB 7.8* 7.7* 8.1* 7.9* 8.4*  HCT 25.5* 25.6* 26.4* 26.0* 27.6*  MCV 79.4 80.5 79.8 80.2 79.8  PLT 276 253 277 270 336   CBG:  Recent Labs Lab 12/15/14 2005 12/16/14 0727 12/16/14 1147 12/16/14 1721 12/16/14 2029  GLUCAP 215* 149* 109* 129* 146*   Scheduled Meds: . carvedilol  3.125 mg Oral BID WC  . erythromycin  1 application Right Eye QHS  . multivitamin with minerals  1 tablet Oral Daily  . mycophenolate  500 mg Oral Daily  . sodium chloride  3 mL Intravenous Q12H   Continuous Infusions: . amiodarone 30 mg/hr (12/16/14 2347)

## 2014-12-17 NOTE — Progress Notes (Signed)
SUBJECTIVE:  Increased work of breathing overnight with rales - given IV lasix  OBJECTIVE:   Vitals:   Filed Vitals:   12/16/14 2029 12/16/14 2100 12/16/14 2149 12/17/14 0521  BP: 159/111 172/76  153/80  Pulse: 116 104  95  Temp: 98.4 F (36.9 C)   97.6 F (36.4 C)  TempSrc: Oral   Oral  Resp: 20   20  Height:      Weight:    190 lb 11.2 oz (86.5 kg)  SpO2: 98%  92% 96%   I&O's:   Intake/Output Summary (Last 24 hours) at 12/17/14 0804 Last data filed at 12/17/14 0503  Gross per 24 hour  Intake    334 ml  Output   2400 ml  Net  -2066 ml   TELEMETRY: Reviewed telemetry pt in atrial fibrillation with RVR at 110 bpm     PHYSICAL EXAM General: Well developed, well nourished, in no acute distress Head: Eyes PERRLA, No xanthomas.   Normal cephalic and atramatic  Lungs: Crackles at bases and rhonchi anteriorly Heart:   Irregularly irregular and tachy S1 S2 Pulses are 2+ & equal. Abdomen: Bowel sounds are positive, abdomen soft and non-tender without masses  Extremities:   No clubbing, cyanosis or edema.  DP +1 Neuro: Alert and oriented X 3. Psych:  Good affect, responds appropriately   LABS: Basic Metabolic Panel:  Recent Labs  12/16/14 0551  NA 140  K 4.3  CL 111  CO2 23  GLUCOSE 152*  BUN 11  CREATININE 1.06  CALCIUM 9.1   Liver Function Tests: No results for input(s): AST, ALT, ALKPHOS, BILITOT, PROT, ALBUMIN in the last 72 hours. No results for input(s): LIPASE, AMYLASE in the last 72 hours. CBC:  Recent Labs  12/16/14 0551 12/17/14 0508  WBC 15.3* 14.9*  HGB 8.4* 8.9*  HCT 27.6* 29.7*  MCV 79.8 79.6  PLT 336 352   Cardiac Enzymes: No results for input(s): CKTOTAL, CKMB, CKMBINDEX, TROPONINI in the last 72 hours. BNP: Invalid input(s): POCBNP D-Dimer: No results for input(s): DDIMER in the last 72 hours. Hemoglobin A1C:  Recent Labs  12/15/14 0450  HGBA1C 5.9*   Fasting Lipid Panel: No results for input(s): CHOL, HDL, LDLCALC, TRIG,  CHOLHDL, LDLDIRECT in the last 72 hours. Thyroid Function Tests: No results for input(s): TSH, T4TOTAL, T3FREE, THYROIDAB in the last 72 hours.  Invalid input(s): FREET3 Anemia Panel: No results for input(s): VITAMINB12, FOLATE, FERRITIN, TIBC, IRON, RETICCTPCT in the last 72 hours. Coag Panel:   Lab Results  Component Value Date   INR 1.26 12/07/2014    RADIOLOGY: Ct Abdomen Pelvis W Contrast  12/07/2014  CLINICAL DATA:  Dark stools and abdominal distention. EXAM: CT ABDOMEN AND PELVIS WITH CONTRAST TECHNIQUE: Multidetector CT imaging of the abdomen and pelvis was performed using the standard protocol following bolus administration of intravenous contrast. CONTRAST:  141mL OMNIPAQUE IOHEXOL 300 MG/ML  SOLN COMPARISON:  None. FINDINGS: Normal hepatic contour. The examination is minimally degraded secondary exclusion of the dome of the diaphragm. No discrete hepatic lesions. There are multiple layering laminated gallstones seen throughout the gallbladder. This finding is associated with a potential very minimal amount of gallbladder wall thickening and pericholecystic fluid though this may be accentuated due to a minimal amount of third spacing within the abdomen. No definite intra or extrahepatic bili duct dilatation. No ascites. There is symmetric enhancement and excretion of the bilateral kidneys. No definite renal stones in this postcontrast examination. No discrete renal lesions. There  is a minimal amount of bilateral perinephric stranding, likely age and body habitus related. No urinary obstruction. Normal appearance of the bilateral adrenal glands, pancreas and spleen. Ingested enteric contrast extends to the level of the distal transverse colon. Moderate colonic stool burden without evidence of enteric obstruction. The bowel is otherwise normal in course and caliber without wall thickening. Normal appearance of the terminal ileum. There is is a minimal amount of stranding throughout the root of  the abdominal mesenteric including the right lower abdominal quadrant surrounding the appendix without focality and favored to be secondary to third spacing/volume overload. Otherwise, normal appearance of the appendix. Moderate amount of mixed calcified and noncalcified atherosclerotic plaque within a normal caliber abdominal aorta. The major branch vessels of the abdominal aorta appear patent on this non CTA examination. 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 No bulky retroperitoneal, mesenteric, pelvic or inguinal lymphadenopathy. The prostate is borderline enlarged with mass effect on the undersurface urinary bladder. Otherwise normal appearance of the urinary bladder given degree distention. There is a small amount of fluid seen within the pelvic cul-de-sac. Limited visualization of the lower thorax is degraded secondary to patient respiratory artifact. Trace bilateral pleural effusions, right greater than left, with associated dependent subpleural opacities, likely atelectasis. Cardiomegaly. Coronary artery calcifications. No pericardial effusion. No acute or aggressive osseous abnormalities. Mild-to-moderate multilevel lumbar spine DDD, worse at L2-L3 with disc space height loss, endplate irregularity and sclerosis. Mild diffuse body wall anasarca. Regional soft tissues appear otherwise normal. IMPRESSION: 1. Cardiomegaly with findings worrisome for pulmonary edema including trace bilateral effusions, right greater than left, mild diffuse body wall anasarca and minimal amount of mesenteric stranding / third-spacing throughout the peritoneum. Clinical correlation is advised. 2. Cholelithiasis with potential minimal amount of gallbladder wall thickening and pericholecystic fluid, again, potentially attributable to suspected third-spacing within the peritoneal cavity - if clinical concern persists for acute cholecystitis, further evaluation with  gallbladder ultrasound and/or nuclear medicine HIDA scan could be performed as clinically indicated. 3. Coronary artery calcifications. 4. Prostatomegaly. Electronically Signed   By: Sandi Mariscal M.D.   On: 12/07/2014 11:43    ASSESSMENT AND PLAN:  1. Preoperative Risk Assessment for Partial Colectomy - Distant CAD CABG 91 no angina preop doing well With no signs of ischemia   2. Chronic Atrial Fibrillation -This patients CHA2DS2-VASc Score and unadjusted Ischemic Stroke Rate (% per year) is equal to 4.8 % stroke rate/year from a score of 4 (HTN, MI, Age > 75x2). Was not on anticoagulation prior to admission. Would not be candidate currently due to GI bleed.  HR currently elevated at rest at 110 bpm.   - continue Amio for rate control.  Will increase Coreg to 6.25mg  BID for better rate control.    3. History of CAD - s/p CABG 1991, with no subsequent interventions since. - Plavix has been held in setting of GI Bleed.  4.  Iron deficiency anemia/Colon mass - per surgery and IM  5.  Acute on chronic diastolic CHF - had increased work up breathing last night with O2 sats 83%.   Given IV lasix.  He put out a net of 2L yesterday.  Lungs sound wet this am.  Will check CXRAY and BNP.  Give lasix 40mg  IV now.    Sueanne Margarita, MD  12/17/2014  8:04 AM

## 2014-12-18 DIAGNOSIS — C189 Malignant neoplasm of colon, unspecified: Secondary | ICD-10-CM

## 2014-12-18 DIAGNOSIS — D72829 Elevated white blood cell count, unspecified: Secondary | ICD-10-CM

## 2014-12-18 LAB — CBC
HEMATOCRIT: 30.1 % — AB (ref 39.0–52.0)
Hemoglobin: 9.2 g/dL — ABNORMAL LOW (ref 13.0–17.0)
MCH: 24.7 pg — ABNORMAL LOW (ref 26.0–34.0)
MCHC: 30.6 g/dL (ref 30.0–36.0)
MCV: 80.7 fL (ref 78.0–100.0)
PLATELETS: 332 10*3/uL (ref 150–400)
RBC: 3.73 MIL/uL — ABNORMAL LOW (ref 4.22–5.81)
RDW: 19.6 % — AB (ref 11.5–15.5)
WBC: 8.5 10*3/uL (ref 4.0–10.5)

## 2014-12-18 LAB — GLUCOSE, CAPILLARY: Glucose-Capillary: 121 mg/dL — ABNORMAL HIGH (ref 65–99)

## 2014-12-18 LAB — STREP PNEUMONIAE URINARY ANTIGEN: Strep Pneumo Urinary Antigen: NEGATIVE

## 2014-12-18 LAB — BASIC METABOLIC PANEL
ANION GAP: 5 (ref 5–15)
BUN: 17 mg/dL (ref 6–20)
CALCIUM: 8.9 mg/dL (ref 8.9–10.3)
CO2: 28 mmol/L (ref 22–32)
CREATININE: 0.96 mg/dL (ref 0.61–1.24)
Chloride: 108 mmol/L (ref 101–111)
GLUCOSE: 114 mg/dL — AB (ref 65–99)
Potassium: 3.5 mmol/L (ref 3.5–5.1)
Sodium: 141 mmol/L (ref 135–145)

## 2014-12-18 MED ORDER — LEVOTHYROXINE SODIUM 88 MCG PO TABS
88.0000 ug | ORAL_TABLET | Freq: Every day | ORAL | Status: DC
  Administered 2014-12-18 – 2014-12-21 (×4): 88 ug via ORAL
  Filled 2014-12-18 (×4): qty 1

## 2014-12-18 MED ORDER — FUROSEMIDE 10 MG/ML IJ SOLN
40.0000 mg | Freq: Every day | INTRAMUSCULAR | Status: DC
  Administered 2014-12-18: 40 mg via INTRAVENOUS
  Filled 2014-12-18: qty 4

## 2014-12-18 MED ORDER — ACETAMINOPHEN 325 MG PO TABS: 325.0000 mg | ORAL_TABLET | Freq: Four times a day (QID) | ORAL | Status: DC | PRN

## 2014-12-18 MED ORDER — LEVOFLOXACIN IN D5W 750 MG/150ML IV SOLN
750.0000 mg | INTRAVENOUS | Status: DC
  Administered 2014-12-18 – 2014-12-20 (×3): 750 mg via INTRAVENOUS
  Filled 2014-12-18 (×3): qty 150

## 2014-12-18 MED ORDER — MORPHINE SULFATE (PF) 2 MG/ML IV SOLN
0.5000 mg | Freq: Once | INTRAVENOUS | Status: AC
  Administered 2014-12-18: 0.5 mg via INTRAVENOUS
  Filled 2014-12-18: qty 1

## 2014-12-18 MED ORDER — MORPHINE SULFATE (PF) 2 MG/ML IV SOLN
0.5000 mg | INTRAVENOUS | Status: DC | PRN
  Administered 2014-12-18 – 2014-12-19 (×5): 1 mg via INTRAVENOUS
  Filled 2014-12-18 (×6): qty 1

## 2014-12-18 NOTE — Progress Notes (Signed)
SUBJECTIVE:  No complaints  OBJECTIVE:   Vitals:   Filed Vitals:   12/17/14 1422 12/17/14 2228 12/18/14 0341 12/18/14 0614  BP: 158/89 139/82  143/92  Pulse: 90 90  77  Temp: 98.5 F (36.9 C) 98.2 F (36.8 C)  98.2 F (36.8 C)  TempSrc: Oral Oral  Oral  Resp: 20 18  18   Height:      Weight:   184 lb 9.6 oz (83.734 kg)   SpO2: 96% 100%  94%   I&O's:   Intake/Output Summary (Last 24 hours) at 12/18/14 0803 Last data filed at 12/18/14 0700  Gross per 24 hour  Intake  617.6 ml  Output   3220 ml  Net -2602.4 ml   TELEMETRY: Reviewed telemetry pt in Atrial fibrillation with episodes of HR in the 130's:     PHYSICAL EXAM General: Well developed, well nourished, in no acute distress Head: Eyes PERRLA, No xanthomas.   Normal cephalic and atramatic  Lungs:   Clear bilaterally to auscultation anteriorly Heart:   Irregularly irregular S1 S2 Pulses are 2+ & equal. Abdomen: Bowel sounds are positive, abdomen soft and non-tender without masses  Extremities:   No clubbing, cyanosis or edema.  DP +1 Neuro: Alert and oriented X 3. Psych:  Good affect, responds appropriately   LABS: Basic Metabolic Panel:  Recent Labs  12/17/14 0508 12/18/14 0507  NA 144 141  K 4.0 3.5  CL 107 108  CO2 25 28  GLUCOSE 123* 114*  BUN 11 17  CREATININE 1.07 0.96  CALCIUM 9.3 8.9   Liver Function Tests: No results for input(s): AST, ALT, ALKPHOS, BILITOT, PROT, ALBUMIN in the last 72 hours. No results for input(s): LIPASE, AMYLASE in the last 72 hours. CBC:  Recent Labs  12/17/14 0508 12/18/14 0507  WBC 14.9* 8.5  HGB 8.9* 9.2*  HCT 29.7* 30.1*  MCV 79.6 80.7  PLT 352 332   Cardiac Enzymes: No results for input(s): CKTOTAL, CKMB, CKMBINDEX, TROPONINI in the last 72 hours. BNP: Invalid input(s): POCBNP D-Dimer: No results for input(s): DDIMER in the last 72 hours. Hemoglobin A1C: No results for input(s): HGBA1C in the last 72 hours. Fasting Lipid Panel: No results for  input(s): CHOL, HDL, LDLCALC, TRIG, CHOLHDL, LDLDIRECT in the last 72 hours. Thyroid Function Tests: No results for input(s): TSH, T4TOTAL, T3FREE, THYROIDAB in the last 72 hours.  Invalid input(s): FREET3 Anemia Panel: No results for input(s): VITAMINB12, FOLATE, FERRITIN, TIBC, IRON, RETICCTPCT in the last 72 hours. Coag Panel:   Lab Results  Component Value Date   INR 1.26 12/07/2014    RADIOLOGY: Dg Chest 2 View  12/17/2014  CLINICAL DATA:  Mid chest pain for 1 day EXAM: CHEST  2 VIEW COMPARISON:  Radiograph 04/17/2012 FINDINGS: NG tube with tip at the gastroesophageal junction. Recommend advancement. Cardiac silhouette is mildly enlarged compared to prior. There is new perihilar airspace disease bilateral with moderate effusions. There is a RIGHT suprahilar opacity which likely represents airspace disease. IMPRESSION: 1. Cardiomegaly with bilateral central airspace disease and moderate effusions suggest congestive heart failure. Cannot exclude multifocal pneumonia. 2. RIGHT suprahilar density likely represents airspace disease. Recommend follow-up radiographs to ensure resolution. 3. NG tube terminates at the GE junction.  Recommend advancement. Electronically Signed   By: Suzy Bouchard M.D.   On: 12/17/2014 09:24   Ct Abdomen Pelvis W Contrast  12/07/2014  CLINICAL DATA:  Dark stools and abdominal distention. EXAM: CT ABDOMEN AND PELVIS WITH CONTRAST TECHNIQUE: Multidetector CT imaging  of the abdomen and pelvis was performed using the standard protocol following bolus administration of intravenous contrast. CONTRAST:  162mL OMNIPAQUE IOHEXOL 300 MG/ML  SOLN COMPARISON:  None. FINDINGS: Normal hepatic contour. The examination is minimally degraded secondary exclusion of the dome of the diaphragm. No discrete hepatic lesions. There are multiple layering laminated gallstones seen throughout the gallbladder. This finding is associated with a potential very minimal amount of gallbladder wall  thickening and pericholecystic fluid though this may be accentuated due to a minimal amount of third spacing within the abdomen. No definite intra or extrahepatic bili duct dilatation. No ascites. There is symmetric enhancement and excretion of the bilateral kidneys. No definite renal stones in this postcontrast examination. No discrete renal lesions. There is a minimal amount of bilateral perinephric stranding, likely age and body habitus related. No urinary obstruction. Normal appearance of the bilateral adrenal glands, pancreas and spleen. Ingested enteric contrast extends to the level of the distal transverse colon. Moderate colonic stool burden without evidence of enteric obstruction. The bowel is otherwise normal in course and caliber without wall thickening. Normal appearance of the terminal ileum. There is is a minimal amount of stranding throughout the root of the abdominal mesenteric including the right lower abdominal quadrant surrounding the appendix without focality and favored to be secondary to third spacing/volume overload. Otherwise, normal appearance of the appendix. Moderate amount of mixed calcified and noncalcified atherosclerotic plaque within a normal caliber abdominal aorta. The major branch vessels of the abdominal aorta appear patent on this non CTA examination. 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 No bulky retroperitoneal, mesenteric, pelvic or inguinal lymphadenopathy. The prostate is borderline enlarged with mass effect on the undersurface urinary bladder. Otherwise normal appearance of the urinary bladder given degree distention. There is a small amount of fluid seen within the pelvic cul-de-sac. Limited visualization of the lower thorax is degraded secondary to patient respiratory artifact. Trace bilateral pleural effusions, right greater than left, with associated dependent subpleural opacities, likely atelectasis.  Cardiomegaly. Coronary artery calcifications. No pericardial effusion. No acute or aggressive osseous abnormalities. Mild-to-moderate multilevel lumbar spine DDD, worse at L2-L3 with disc space height loss, endplate irregularity and sclerosis. Mild diffuse body wall anasarca. Regional soft tissues appear otherwise normal. IMPRESSION: 1. Cardiomegaly with findings worrisome for pulmonary edema including trace bilateral effusions, right greater than left, mild diffuse body wall anasarca and minimal amount of mesenteric stranding / third-spacing throughout the peritoneum. Clinical correlation is advised. 2. Cholelithiasis with potential minimal amount of gallbladder wall thickening and pericholecystic fluid, again, potentially attributable to suspected third-spacing within the peritoneal cavity - if clinical concern persists for acute cholecystitis, further evaluation with gallbladder ultrasound and/or nuclear medicine HIDA scan could be performed as clinically indicated. 3. Coronary artery calcifications. 4. Prostatomegaly. Electronically Signed   By: Sandi Mariscal M.D.   On: 12/07/2014 11:43    ASSESSMENT AND PLAN:  1. Preoperative Risk Assessment for Partial Colectomy - Distant CAD CABG 91 no angina preop doing well With no signs of ischemia   2. Chronic Atrial Fibrillation -This patients CHA2DS2-VASc Score and unadjusted Ischemic Stroke Rate (% per year) is equal to 4.8 % stroke rate/year from a score of 4 (HTN, MI, Age > 75x2). Was not on anticoagulation prior to admission most likely due to advanced age and fall risk. Would not be candidate currently due to GI bleed. HR currently elevated at rest at 110 bpm.  - continue Amio  for rate control. Coreg had been on hold due to NPO but given this am.  Will see how HR does and titrate as needed for better BP control.  3. History of CAD - s/p CABG 1991, with no subsequent interventions since. - Plavix has been held in setting of GI Bleed.  4. Iron  deficiency anemia/Colon mass - per surgery and IM  5. Acute on chronic diastolic CHF - breathing significantly improved after IV lasix. He put out a net of 2L yesterday and is almost 5L net neg. BNP yesterday 877. CXRAY yesterday with CHF.  Continue Lasix 40mg  IV daily for now.   Renal function stable.    Sueanne Margarita, MD  12/18/2014  8:03 AM

## 2014-12-18 NOTE — Progress Notes (Signed)
Patient ID: Jesse Burton, male   DOB: 16-Sep-1925, 81 y.o.   MRN: [id number]  [facility] PROGRESS NOTE  Abdulhadi Stopa UMP:[id number] DOB: 1926-02-18 DOA: 12/07/2014 PCP: No primary care provider on file.   Brief narrative:     81 y.o. male with PMH of Dementia, hypertension, hyperlipidemia, hypothyroidism, arthritis, CAD/CABG, retinitis, BPH, atrial fibrillation (not on anticoagulation), who presented with low hemoglobin. He was brought in ED after he was found to have low Hgb of 4.5 in [facility]. Since admission, seen by GI, plavix was stopped and received 3 units PRBC since then his Hb has been stable in the 7.8 range, underwent Colonoscopy 10/24-transverse colon mass. On 10/25 [facility]CS was consulted.  Assessment/Plan:    Iron deficiency anemia/Colon mass - held plavix, s/p 3 units PRBC on day 1, Plavix since held - GI following, underwent colonoscopy on 10/24, noted transverse colon mass likely source of bleeding - underwent laparoscopic right colectomy 10/27, confused post op but remains clinically stable  - Pathology pos for invasive adenocarcinoma  - Oncology consulted with recs for surgery with no role in systemic chemo or radiation therapy - diet per surgery team  Leukocytosis - post op related - CXR with concern for PNA - will place on empiric Levaquin for now, pneumonia order set in place  - CBC pending this AM  Acute Dementia with sundowning - Cont low dose Haldol PRN if needed, re-orientation techniques  Acute on chronic Diastolic CHF with hypoxia - still with cackles on exam this AM  - ECHO with preserved EF - per cardio, lasix IV and monitor clinical response  - IVF stopped 10/28  CAD:  - s/p of CABG in 1991 - stable, holding plavix   Arthritis: - Continued tylenol and tramadol if needed   Retinitis: - Continued on cellcept - continue home eye drops   Atrial Fibrillation:  - CHA2DS2-VASc Score is 3, which indicates pt would  normally benefit from anticoagulation, however, pt was not on anticoagulation at home, likely secondary to old age and high risk of fall.  - HR improved now, was uncontrolled 10/20 afternoon and started on Metoprolol, will add Metoprolol as needed  - continue amiodarone   HLD:  -Continue Lipitor  Hypothyroidism:  -Continue Synthroid  BPH:  - stable - Continue Flomax  DVT prophylaxis - SCD's  Code Status: Full.  Family Communication:  plan of care discussed with the patient and daughter at bedside  Disposition Plan: Home when stable.   IV access:  Peripheral IV  Procedures and diagnostic studies:    Ct Abdomen Pelvis W Contrast 12/07/2014  Cardiomegaly with findings worrisome for pulmonary edema including trace bilateral effusions, right greater than left, mild diffuse body wall anasarca and minimal amount of mesenteric stranding / third-spacing throughout the peritoneum. Clinical correlation is advised. 2. Cholelithiasis with potential minimal amount of gallbladder wall thickening and pericholecystic fluid, again, potentially attributable to suspected third-spacing within the peritoneal cavity - if clinical concern persists for acute cholecystitis, further evaluation with gallbladder ultrasound and/or nuclear medicine HIDA scan could be performed as clinically indicated. 3. Coronary artery calcifications. 4. Prostatomegaly.   Dg Chest 2 View Cardiomegaly with bilateral central airspace disease and moderate effusions suggest congestive heart failure. Cannot exclude multifocal pneumonia. 2. RIGHT suprahilar density likely represents airspace disease.  Medical Consultants:   Cardiology  Oncology  General Surgery  GI  Other Consultants:   None  IAnti-Infectives:    Levaquin 10/30 -->  MAGICK-Peter Daquila,  MD  [facility] Pager [phone]  If 7PM-7AM, please contact night-coverage [email or web address] Password [email or web address] 12/18/2014, 5:31 AM   LOS: 11 days   HPI/Subjective: No  events overnight.   Objective: Filed Vitals:   12/17/14 0521 12/17/14 1422 12/17/14 2228 12/18/14 0341  BP: 153/80 158/89 139/82   Pulse: 95 90 90   Temp: 97.6 F (36.4 C) 98.5 F (36.9 C) 98.2 F (36.8 C)   TempSrc: Oral Oral Oral   Resp: 20 20 18    Height:      Weight: 86.5 kg (190 lb 11.2 oz)   83.734 kg (184 lb 9.6 oz)  SpO2: 96% 96% 100%     Intake/Output Summary (Last 24 hours) at 12/18/14 0531 Last data filed at 12/18/14 0330  Gross per 24 hour  Intake  400.8 ml  Output   3220 ml  Net -2819.2 ml    Exam:   General:  Pt is alert, confused, NAD  Cardiovascular: Irregular rate and rhythm, tachycardic, no rubs, no gallops  Respiratory: crackles at bases   Abdomen: Soft, non tender, non distended, bowel sounds present, no guarding  Data Reviewed: Basic Metabolic Panel:  Recent Labs Lab 12/12/14 0533 12/13/14 0505 12/16/14 0551 12/17/14 0508  NA 140 139 140 144  K 4.4 4.4 4.3 4.0  CL 107 105 111 107  CO2 26 27 23 25   GLUCOSE 94 97 152* 123*  BUN 21* 16 11 11   CREATININE 1.00 1.05 1.06 1.07  CALCIUM 8.8* 8.9 9.1 9.3   CBC:  Recent Labs Lab 12/12/14 0533 12/13/14 0505 12/15/14 0450 12/16/14 0551 12/17/14 0508  WBC 4.8 5.8 4.5 15.3* 14.9*  HGB 7.7* 8.1* 7.9* 8.4* 8.9*  HCT 25.6* 26.4* 26.0* 27.6* 29.7*  MCV 80.5 79.8 80.2 79.8 79.6  PLT 253 277 270 336 352   CBG:  Recent Labs Lab 12/16/14 0727 12/16/14 1147 12/16/14 1721 12/16/14 2029 12/17/14 0734  GLUCAP 149* 109* 129* 146* 120*   Scheduled Meds: . carvedilol  3.125 mg Oral BID WC  . erythromycin  1 application Right Eye QHS  . multivitamin with minerals  1 tablet Oral Daily  . mycophenolate  500 mg Oral Daily  . sodium chloride  3 mL Intravenous Q12H   Continuous Infusions: . amiodarone 30 mg/hr (12/18/14 0019)

## 2014-12-18 NOTE — Progress Notes (Signed)
Patient ID: Jesse Burton, male   DOB: 01-Dec-1925, 81 y.o.   MRN: [id number] 3 Days Post-Op  Subjective: Still a little confused.  Belching decreased.  Pt hungry.    Objective: Vital signs in last 24 hours: Temp:  [98.2 F (36.8 C)-98.5 F (36.9 C)] 98.2 F (36.8 C) (10/30 7939) Pulse Rate:  [77-90] 77 (10/30 0614) Resp:  [18-20] 18 (10/30 0614) BP: (139-158)/(82-92) 143/92 mmHg (10/30 0614) SpO2:  [94 %-100 %] 94 % (10/30 0614) Weight:  [83.734 kg (184 lb 9.6 oz)] 83.734 kg (184 lb 9.6 oz) (10/30 0341) Last BM Date: 12/15/14  Intake/Output from previous day: 10/29 0701 - 10/30 0700 In: 617.6 [I.V.:467.6; IV Piggyback:150] Out: 3220 [Urine:3220] Intake/Output this shift:   Gen- alert and oriented Abd - soft, approp tender.  Minimally distended.    Lab Results:   Recent Labs  12/17/14 0508 12/18/14 0507  WBC 14.9* 8.5  HGB 8.9* 9.2*  HCT 29.7* 30.1*  PLT 352 332   BMET  Recent Labs  12/17/14 0508 12/18/14 0507  NA 144 141  K 4.0 3.5  CL 107 108  CO2 25 28  GLUCOSE 123* 114*  BUN 11 17  CREATININE 1.07 0.96  CALCIUM 9.3 8.9   PT/INR No results for input(s): LABPROT, INR in the last 72 hours. ABG No results for input(s): PHART, HCO3 in the last 72 hours.  Invalid input(s): PCO2, PO2  Studies/Results: Dg Chest 2 View  12/17/2014  CLINICAL DATA:  Mid chest pain for 1 day EXAM: CHEST  2 VIEW COMPARISON:  Radiograph 04/17/2012 FINDINGS: NG tube with tip at the gastroesophageal junction. Recommend advancement. Cardiac silhouette is mildly enlarged compared to prior. There is new perihilar airspace disease bilateral with moderate effusions. There is a RIGHT suprahilar opacity which likely represents airspace disease. IMPRESSION: 1. Cardiomegaly with bilateral central airspace disease and moderate effusions suggest congestive heart failure. Cannot exclude multifocal pneumonia. 2. RIGHT suprahilar density likely represents airspace disease. Recommend follow-up  radiographs to ensure resolution. 3. NG tube terminates at the GE junction.  Recommend advancement. Electronically Signed   By: Suzy Bouchard M.D.   On: 12/17/2014 09:24    Anti-infectives: Anti-infectives    Start     Dose/Rate Route Frequency Ordered Stop   12/18/14 0600  levofloxacin (LEVAQUIN) IVPB 750 mg     750 mg 100 mL/hr over 90 Minutes Intravenous Every 24 hours 12/18/14 0545 12/23/14 0559   12/15/14 1045  cefOXitin (MEFOXIN) 2 g in dextrose 5 % 50 mL IVPB     2 g 100 mL/hr over 30 Minutes Intravenous  Once 12/15/14 1014 12/15/14 1045      Assessment/Plan: POD 3 lap colectomy  Advance to clear liquids.   Pt unable to clearly tell us if having flatus, but given minimal distention, no n/v, no need for antiemetics, will try clears.    Ailey Wessling 12/18/2014

## 2014-12-19 DIAGNOSIS — E039 Hypothyroidism, unspecified: Secondary | ICD-10-CM | POA: Insufficient documentation

## 2014-12-19 DIAGNOSIS — K922 Gastrointestinal hemorrhage, unspecified: Secondary | ICD-10-CM

## 2014-12-19 LAB — CBC
HEMATOCRIT: 33 % — AB (ref 39.0–52.0)
HEMOGLOBIN: 10 g/dL — AB (ref 13.0–17.0)
MCH: 24.3 pg — AB (ref 26.0–34.0)
MCHC: 30.3 g/dL (ref 30.0–36.0)
MCV: 80.3 fL (ref 78.0–100.0)
Platelets: 342 10*3/uL (ref 150–400)
RBC: 4.11 MIL/uL — AB (ref 4.22–5.81)
RDW: 19.8 % — ABNORMAL HIGH (ref 11.5–15.5)
WBC: 7.5 10*3/uL (ref 4.0–10.5)

## 2014-12-19 LAB — LEGIONELLA PNEUMOPHILA SEROGP 1 UR AG: L. pneumophila Serogp 1 Ur Ag: NEGATIVE

## 2014-12-19 LAB — GLUCOSE, CAPILLARY: GLUCOSE-CAPILLARY: 144 mg/dL — AB (ref 65–99)

## 2014-12-19 LAB — BASIC METABOLIC PANEL
Anion gap: 8 (ref 5–15)
BUN: 21 mg/dL — ABNORMAL HIGH (ref 6–20)
CHLORIDE: 104 mmol/L (ref 101–111)
CO2: 27 mmol/L (ref 22–32)
Calcium: 8.9 mg/dL (ref 8.9–10.3)
Creatinine, Ser: 1.04 mg/dL (ref 0.61–1.24)
GFR calc non Af Amer: >60 mL/min (ref >60)
Glucose, Bld: 109 mg/dL — ABNORMAL HIGH (ref 65–99)
POTASSIUM: 3.8 mmol/L (ref 3.5–5.1)
SODIUM: 139 mmol/L (ref 135–145)

## 2014-12-19 MED ORDER — ENOXAPARIN SODIUM 40 MG/0.4ML ~~LOC~~ SOLN
40.0000 mg | SUBCUTANEOUS | Status: DC
  Administered 2014-12-19 – 2014-12-21 (×3): 40 mg via SUBCUTANEOUS
  Filled 2014-12-19 (×3): qty 0.4

## 2014-12-19 MED ORDER — BISACODYL 10 MG RE SUPP
10.0000 mg | Freq: Two times a day (BID) | RECTAL | Status: AC
Start: 2014-12-19 — End: 2014-12-19
  Administered 2014-12-19 (×2): 10 mg via RECTAL
  Filled 2014-12-19 (×2): qty 1

## 2014-12-19 MED ORDER — AMIODARONE HCL 200 MG PO TABS
200.0000 mg | ORAL_TABLET | Freq: Every day | ORAL | Status: DC
  Administered 2014-12-19 – 2014-12-21 (×3): 200 mg via ORAL
  Filled 2014-12-19 (×3): qty 1

## 2014-12-19 MED ORDER — FUROSEMIDE 40 MG PO TABS
40.0000 mg | ORAL_TABLET | Freq: Every day | ORAL | Status: DC
  Administered 2014-12-19 – 2014-12-21 (×3): 40 mg via ORAL
  Filled 2014-12-19 (×3): qty 1

## 2014-12-19 NOTE — Progress Notes (Addendum)
Patient ID: Jesse Burton, male   DOB: 1925/07/25, 81 y.o.   MRN: [id number]  [facility] PROGRESS NOTE  Jesse Burton HKV:[id number] DOB: [birth year] DOA: 12/07/2014 PCP: No primary care provider on file.   Brief narrative:     81 y.o. male with PMH of Dementia, hypertension, hyperlipidemia, hypothyroidism, arthritis, CAD/CABG, retinitis, BPH, atrial fibrillation (not on anticoagulation), who presented with low hemoglobin. He was brought in ED after he was found to have low Hgb of 4.5 in [place] [facility]. Since admission, seen by GI, plavix was stopped and received 3 units PRBC since then his Hb has been stable in the 7.8 range, underwent Colonoscopy 10/24-transverse colon mass. On 10/25 [facility] was consulted.  Assessment/Plan:    Iron deficiency anemia/Colon mass - held plavix, s/p 3 units PRBC on day 1, Plavix since held - GI following, underwent colonoscopy on 10/24, noted transverse colon mass likely source of bleeding - underwent laparoscopic right colectomy 10/27, confused post op but remains clinically stable  - Pathology pos for invasive adenocarcinoma  - Oncology consulted with recs of no role in systemic chemo or radiation therapy - diet per surgery team, plan to advance to clears   Leukocytosis - post op related - CXR with concern for PNA, multifocal  - today is Levaquin day #2, WBC is now WNL and pt is afebrile   Acute Dementia with sundowning - Cont low dose Haldol PRN if needed, re-orientation techniques - pt is NAD this AM, sitting in chair   Acute on chronic Diastolic CHF with hypoxia - ECHO with preserved EF - per cardio, continue with lasix  - IVF stopped 10/28  CAD:  - s/p of CABG in 1991 - stable, holding plavix   Arthritis: - Continued tylenol and tramadol if needed   Retinitis: - Continued on cellcept - continue home eye drops   Atrial Fibrillation:  - CHA2DS2-VASc Score is 3, which indicates pt would normally benefit from  anticoagulation, however, pt was not on anticoagulation at home, likely secondary to old age and high risk of fall.  - HR controlled on Coreg and amiodarone   HLD:  -Continue Lipitor  Hypothyroidism:  -Continue Synthroid  BPH:  - stable - Continue Flomax  DVT prophylaxis - Lovenox SQ  Code Status: Full.  Family Communication:  plan of care discussed with daughter at bedside 10/30, no family at bedside this AM Disposition Plan: to be determined and when surgery team clears for d/c  IV access:  Peripheral IV  Procedures and diagnostic studies:    Ct Abdomen Pelvis W Contrast 12/07/2014  Cardiomegaly with findings worrisome for pulmonary edema including trace bilateral effusions, right greater than left, mild diffuse body wall anasarca and minimal amount of mesenteric stranding / third-spacing throughout the peritoneum. Clinical correlation is advised. 2. Cholelithiasis with potential minimal amount of gallbladder wall thickening and pericholecystic fluid, again, potentially attributable to suspected third-spacing within the peritoneal cavity - if clinical concern persists for acute cholecystitis, further evaluation with gallbladder ultrasound and/or nuclear medicine HIDA scan could be performed as clinically indicated. 3. Coronary artery calcifications. 4. Prostatomegaly.   Dg Chest 2 View Cardiomegaly with bilateral central airspace disease and moderate effusions suggest congestive heart failure. Cannot exclude multifocal pneumonia. 2. RIGHT suprahilar density likely represents airspace disease.  Medical Consultants:   Cardiology  Oncology  General Surgery  GI  Other Consultants:   None  IAnti-Infectives:    Levaquin 10/30 -->  Faye Ramsay, MD  [facility] Pager  [phone]  If 7PM-7AM, please contact night-coverage [email or web address] Password [email or web address] 12/19/2014, 8:23 AM   LOS: 12 days   HPI/Subjective: No events overnight.   Objective: Filed Vitals:   12/18/14  0614 12/18/14 1330 12/18/14 2100 12/19/14 0546  BP: 143/92 123/80 126/70 145/58  Pulse: 77 97 96 86  Temp: 98.2 F (36.8 C) 98.2 F (36.8 C) 98.1 F (36.7 C) 98.1 F (36.7 C)  TempSrc: Oral Oral Oral Oral  Resp: 18 18 16 18   Height:      Weight:    82.8 kg (182 lb 8.7 oz)  SpO2: 94% 97% 98% 97%    Intake/Output Summary (Last 24 hours) at 12/19/14 0823 Last data filed at 12/19/14 0258  Gross per 24 hour  Intake  990.4 ml  Output   1675 ml  Net -684.6 ml    Exam:   General:  Pt is alert, confused, NAD  Cardiovascular: Irregular rate and rhythm, no rubs, no gallops  Respiratory: crackles at bases   Abdomen: Soft, non tender, non distended, bowel sounds present, no guarding  Data Reviewed: Basic Metabolic Panel:  Recent Labs Lab 12/13/14 0505 12/16/14 0551 12/17/14 0508 12/18/14 0507 12/19/14 0539  NA 139 140 144 141 139  K 4.4 4.3 4.0 3.5 3.8  CL 105 111 107 108 104  CO2 27 23 25 28 27   GLUCOSE 97 152* 123* 114* 109*  BUN 16 11 11 17  21*  CREATININE 1.05 1.06 1.07 0.96 1.04  CALCIUM 8.9 9.1 9.3 8.9 8.9   CBC:  Recent Labs Lab 12/15/14 0450 12/16/14 0551 12/17/14 0508 12/18/14 0507 12/19/14 0539  WBC 4.5 15.3* 14.9* 8.5 7.5  HGB 7.9* 8.4* 8.9* 9.2* 10.0*  HCT 26.0* 27.6* 29.7* 30.1* 33.0*  MCV 80.2 79.8 79.6 80.7 80.3  PLT 270 336 352 332 342   CBG:  Recent Labs Lab 12/16/14 1147 12/16/14 1721 12/16/14 2029 12/17/14 0734 12/18/14 0730  GLUCAP 109* 129* 146* 120* 121*   . amiodarone  200 mg Oral Daily  . bisacodyl  10 mg Rectal BID  . carvedilol  3.125 mg Oral BID WC  . enoxaparin (LOVENOX) injection  40 mg Subcutaneous Q24H  . erythromycin  1 application Right Eye QHS  . furosemide  40 mg Oral Daily  . levofloxacin (LEVAQUIN) IV  750 mg Intravenous Q24H  . levothyroxine  88 mcg Oral QAC breakfast  . multivitamin with minerals  1 tablet Oral Daily  . mycophenolate  500 mg Oral Daily  . sodium chloride  3 mL Intravenous Q12H    Continuous Infusions: . amiodarone 30 mg/hr (12/18/14 2124)

## 2014-12-19 NOTE — Progress Notes (Signed)
4 Days Post-Op  Subjective: Remains pleasantly demented.  No agitation.  Does not appear to be in any distress. No vomiting.  No stools.  Seems to be tolerating clear liquids.  Hemoglobin 10.0.  WBC 7500.  Potassium 3.8.  Creatinine 1.04.  Glucose 109.    Objective: Vital signs in last 24 hours: Temp:  [98.1 F (36.7 C)-98.2 F (36.8 C)] 98.1 F (36.7 C) (10/31 0546) Pulse Rate:  [86-97] 86 (10/31 0546) Resp:  [16-18] 18 (10/31 0546) BP: (123-145)/(58-80) 145/58 mmHg (10/31 0546) SpO2:  [97 %-98 %] 97 % (10/31 0546) Weight:  [82.8 kg (182 lb 8.7 oz)] 82.8 kg (182 lb 8.7 oz) (10/31 0546) Last BM Date: 12/15/14  Intake/Output from previous day: 10/30 0701 - 10/31 0700 In: 990.4 [P.O.:640; I.V.:200.4; IV Piggyback:150] Out: 9563 [Urine:1675] Intake/Output this shift:    General appearance: Pleasant.  Disoriented to date place and situation.  In no distress.  Not agitated.  Skin warm and dry GI: But soft and nontender.  Active bowel sounds.  Wound clean.  Lab Results:  Results for orders placed or performed during the hospital encounter of 12/07/14 (from the past 24 hour(s))  Strep pneumoniae urinary antigen     Status: None   Collection Time: 12/18/14 11:00 AM  Result Value Ref Range   Strep Pneumo Urinary Antigen NEGATIVE NEGATIVE  CBC     Status: Abnormal   Collection Time: 12/19/14  5:39 AM  Result Value Ref Range   WBC 7.5 4.0 - 10.5 K/uL   RBC 4.11 (L) 4.22 - 5.81 MIL/uL   Hemoglobin 10.0 (L) 13.0 - 17.0 g/dL   HCT 33.0 (L) 39.0 - 52.0 %   MCV 80.3 78.0 - 100.0 fL   MCH 24.3 (L) 26.0 - 34.0 pg   MCHC 30.3 30.0 - 36.0 g/dL   RDW 19.8 (H) 11.5 - 15.5 %   Platelets 342 150 - 400 K/uL  Basic metabolic panel     Status: Abnormal   Collection Time: 12/19/14  5:39 AM  Result Value Ref Range   Sodium 139 135 - 145 mmol/L   Potassium 3.8 3.5 - 5.1 mmol/L   Chloride 104 101 - 111 mmol/L   CO2 27 22 - 32 mmol/L   Glucose, Bld 109 (H) 65 - 99 mg/dL   BUN 21 (H) 6 - 20  mg/dL   Creatinine, Ser 1.04 0.61 - 1.24 mg/dL   Calcium 8.9 8.9 - 10.3 mg/dL   GFR calc non Af Amer >60 >60 mL/min   GFR calc Af Amer >60 >60 mL/min   Anion gap 8 5 - 15     Studies/Results: No results found.  . carvedilol  3.125 mg Oral BID WC  . erythromycin  1 application Right Eye QHS  . furosemide  40 mg Oral Daily  . levofloxacin (LEVAQUIN) IV  750 mg Intravenous Q24H  . levothyroxine  88 mcg Oral QAC breakfast  . multivitamin with minerals  1 tablet Oral Daily  . mycophenolate  500 mg Oral Daily  . sodium chloride  3 mL Intravenous Q12H     Assessment/Plan:  POD #3.  Laparoscopic right colectomy Stable Advance to full liquids Dulcolax suppository Check pathology  DVT prophylaxis.  SCDs.  Initiate Lovenox since hemoglobin stable.  Iron deficiency anemia secondary to diagnosed colon cancer.  Hemoglobin stable at 10.0. Acute on chronic diastolic congestive heart failure.  Oral Lasix started yesterday by cardiology Paroxysmal atrial fibrillation.  Not a candidate for anticoagulation due to GI  bleed.  Possibly could be a candidate in the future since colon cancer resected, however high risk due to dementia and fall.  Probably not a good idea. CAD status post CABG 1991.  No angina. BPH.  On Flomax Hypothyroidism.  On Synthroid   @PROBHOSP @  LOS: 12 days    Jalesia Loudenslager M 12/19/2014  . .prob

## 2014-12-19 NOTE — Progress Notes (Signed)
Physical Therapy Treatment Patient Details Name: Jesse Burton MRN: [id number] DOB: 02/20/1925 Today's Date: 12/19/2014    History of Present Illness      PT Comments    Pt.'s cognitive level and endurance limited his mobility today; requiring max assist x2 for STS and EVA +2 assist for ambulation; only ambulated 3 feet today  Follow Up Recommendations  SNF     Equipment Recommendations  None recommended by PT    Recommendations for Other Services       Precautions / Restrictions Precautions Precautions: Fall Restrictions Weight Bearing Restrictions: No    Mobility  Bed Mobility Overal bed mobility: +2 for physical assistance;Needs Assistance Bed Mobility: Sit to Supine       Sit to supine: Max assist;+2 for physical assistance;Mod assist   General bed mobility comments: mod to max assist for STS with HHA on each side  Transfers Overall transfer level: Needs assistance Equipment used: Bilateral platform walker Transfers: Sit to/from Stand Sit to Stand: Mod assist;Max assist;+2 physical assistance         General transfer comment: assist to rise and stabilize/handplacement   Ambulation/Gait Ambulation/Gait assistance: Mod assist;+2 physical assistance;Max assist Ambulation Distance (Feet): 3 Feet Assistive device: Bilateral platform walker Gait Pattern/deviations: Trunk flexed;Decreased step length - left;Decreased step length - right;Step-to pattern;Decreased weight shift to right;Decreased weight shift to left     General Gait Details: pt. only able to ambulate 3 feet today with mod-max assist due to inability to stabilize posture in upright position and cognitive level    Stairs            Wheelchair Mobility    Modified Rankin (Stroke Patients Only)       Balance                                    Cognition Arousal/Alertness: Awake/alert;Suspect due to medications Behavior During Therapy: [facility] for tasks  assessed/performed Overall Cognitive Status: History of cognitive impairments - at baseline                      Exercises      General Comments        Pertinent Vitals/Pain Pain Assessment: Faces Faces Pain Scale: Hurts even more Pain Location: abdomen  Pain Intervention(s): Premedicated before session;Monitored during session;Repositioned    Home Living                      Prior Function            PT Goals (current goals can now be found in the care plan section) Acute Rehab PT Goals Time For Goal Achievement: 12/22/14 Potential to Achieve Goals: Fair Progress towards PT goals: Progressing toward goals    Frequency  Min 3X/week    PT Plan Current plan remains appropriate    Co-evaluation             End of Session Equipment Utilized During Treatment: Gait belt;Oxygen Activity Tolerance: Other (comment) (pt. limited due to cognivite abilities and decreased endurance for stabilizing in upright posture today ) Patient left: in bed;with call bell/phone within reach;with bed alarm set     Time: 1335-1400 PT Time Calculation (min) (ACUTE ONLY): 25 min  Charges:  $Gait Training: 8-22 mins $Therapeutic Activity: 8-22 mins  G CodesDenna Haggard, SPTA   12/19/2014 3:30 PM   Pager: [phone]     reviewed and agree with above Rica Koyanagi  PTA [facility]  Acute  Rehab Pager      [phone]

## 2014-12-19 NOTE — Progress Notes (Signed)
ANTICOAGULATION CONSULT NOTE - Initial Consult  Pharmacy Consult for enoxaparin Indication: VTE prophylaxis  Allergies  Allergen Reactions  . Sulfa Antibiotics Other (See Comments)    Patient states he does not recall an allergy to sulfa  . Ativan [Lorazepam] Anxiety    Patient Measurements: Height: 6' (182.9 cm) Weight: 182 lb 8.7 oz (82.8 kg) IBW/kg (Calculated) : 77.6 Heparin Dosing Weight:   Vital Signs: Temp: 98.1 F (36.7 C) (10/31 0546) Temp Source: Oral (10/31 0546) BP: 145/58 mmHg (10/31 0546) Pulse Rate: 86 (10/31 0546)  Labs:  Recent Labs  12/17/14 0508 12/18/14 0507 12/19/14 0539  HGB 8.9* 9.2* 10.0*  HCT 29.7* 30.1* 33.0*  PLT 352 332 342  CREATININE 1.07 0.96 1.04    Estimated Creatinine Clearance: 54.9 mL/min (by C-G formula based on Cr of 1.04).   Medical History: Past Medical History  Diagnosis Date  . Arthritis   . Thyroid disease   . Neuromuscular disorder ([place])   . Hypertension   . Hypothyroidism   . BPH (benign prostatic hyperplasia)   . Retinitis   . HLD (hyperlipidemia)   . CAD (coronary artery disease) of bypass graft     Assessment: 89 YOM presents with iron deficiency anemia.  s/p lap colectomy 10/27 for colonic mass found to be invasive adenocarcinoma.  Pharmacy asked to dose enoxaparin for VTE prophylaxis.    Today, 12/19/2014  CBC: Hgb improving, pltc WNL  Renal: SCr WNL, CrCl > 2ml/min  Goal of Therapy:  Anti-Xa level 0.3-0.6 units/ml 4hrs after LMWH dose given  Plan:   Enoxaparin 40mg  SQ q24h based on wt adn CrCl  No further dosing adjustments anticipated, will sign-off  Doreene Eland, PharmD, BCPS.   Pager: [phone] 12/19/2014 9:19 AM

## 2014-12-19 NOTE — Progress Notes (Signed)
Patient: Jesse Burton / Admit Date: 12/07/2014 / Date of Encounter: 12/19/2014, 8:17 AM   Subjective: Thinks he's in a restaurant - happily demented. No pain or SOB. Laying essentially flat in bed.   Objective: Telemetry: atrial fib, rates ~80s Physical Exam: Blood pressure 145/58, pulse 86, temperature 98.1 F (36.7 C), temperature source Oral, resp. rate 18, height 6' (1.829 m), weight 182 lb 8.7 oz (82.8 kg), SpO2 97 %. General: Well developed elderly WM in no acute distress. Head: Normocephalic, atraumatic, sclera non-icteric, no xanthomas, nares are without discharge. Neck: JVP not elevated. Lungs: Clear bilaterally to auscultation without wheezes, rales, or rhonchi. Breathing is unlabored. Heart: Irregularly irregular, rate controlled, S1 S2 without murmurs, rubs, or gallops.  Abdomen: Soft, non-tender, non-distended with normoactive bowel sounds. No rebound/guarding. Extremities: No clubbing or cyanosis. No edema. Distal pedal pulses are 2+ and equal bilaterally. Neuro: Alert and oriented to self only. Happy affect but not A+O to time or place.   Intake/Output Summary (Last 24 hours) at 12/19/14 0817 Last data filed at 12/19/14 6734  Gross per 24 hour  Intake  990.4 ml  Output   1675 ml  Net -684.6 ml    Inpatient Medications:  . carvedilol  3.125 mg Oral BID WC  . erythromycin  1 application Right Eye QHS  . furosemide  40 mg Oral Daily  . levofloxacin (LEVAQUIN) IV  750 mg Intravenous Q24H  . levothyroxine  88 mcg Oral QAC breakfast  . multivitamin with minerals  1 tablet Oral Daily  . mycophenolate  500 mg Oral Daily  . sodium chloride  3 mL Intravenous Q12H   Infusions:  . amiodarone 30 mg/hr (12/18/14 2124)    Labs:  Recent Labs  12/18/14 0507 12/19/14 0539  NA 141 139  K 3.5 3.8  CL 108 104  CO2 28 27  GLUCOSE 114* 109*  BUN 17 21*  CREATININE 0.96 1.04  CALCIUM 8.9 8.9   No results for input(s): AST, ALT, ALKPHOS, BILITOT, PROT, ALBUMIN  in the last 72 hours.  Recent Labs  12/18/14 0507 12/19/14 0539  WBC 8.5 7.5  HGB 9.2* 10.0*  HCT 30.1* 33.0*  MCV 80.7 80.3  PLT 332 342   No results for input(s): CKTOTAL, CKMB, TROPONINI in the last 72 hours. Invalid input(s): POCBNP No results for input(s): HGBA1C in the last 72 hours.   Radiology/Studies:  Dg Chest 2 View  12/17/2014  CLINICAL DATA:  Mid chest pain for 1 day EXAM: CHEST  2 VIEW COMPARISON:  Radiograph 04/17/2012 FINDINGS: NG tube with tip at the gastroesophageal junction. Recommend advancement. Cardiac silhouette is mildly enlarged compared to prior. There is new perihilar airspace disease bilateral with moderate effusions. There is a RIGHT suprahilar opacity which likely represents airspace disease. IMPRESSION: 1. Cardiomegaly with bilateral central airspace disease and moderate effusions suggest congestive heart failure. Cannot exclude multifocal pneumonia. 2. RIGHT suprahilar density likely represents airspace disease. Recommend follow-up radiographs to ensure resolution. 3. NG tube terminates at the GE junction.  Recommend advancement. Electronically Signed   By: Suzy Bouchard M.D.   On: 12/17/2014 09:24   Ct Abdomen Pelvis W Contrast  12/07/2014  CLINICAL DATA:  Dark stools and abdominal distention. EXAM: CT ABDOMEN AND PELVIS WITH CONTRAST TECHNIQUE: Multidetector CT imaging of the abdomen and pelvis was performed using the standard protocol following bolus administration of intravenous contrast. CONTRAST:  134mL OMNIPAQUE IOHEXOL 300 MG/ML  SOLN COMPARISON:  None. FINDINGS: Normal hepatic contour. The examination is minimally  degraded secondary exclusion of the dome of the diaphragm. No discrete hepatic lesions. There are multiple layering laminated gallstones seen throughout the gallbladder. This finding is associated with a potential very minimal amount of gallbladder wall thickening and pericholecystic fluid though this may be accentuated due to a minimal  amount of third spacing within the abdomen. No definite intra or extrahepatic bili duct dilatation. No ascites. There is symmetric enhancement and excretion of the bilateral kidneys. No definite renal stones in this postcontrast examination. No discrete renal lesions. There is a minimal amount of bilateral perinephric stranding, likely age and body habitus related. No urinary obstruction. Normal appearance of the bilateral adrenal glands, pancreas and spleen. Ingested enteric contrast extends to the level of the distal transverse colon. Moderate colonic stool burden without evidence of enteric obstruction. The bowel is otherwise normal in course and caliber without wall thickening. Normal appearance of the terminal ileum. There is is a minimal amount of stranding throughout the root of the abdominal mesenteric including the right lower abdominal quadrant surrounding the appendix without focality and favored to be secondary to third spacing/volume overload. Otherwise, normal appearance of the appendix. Moderate amount of mixed calcified and noncalcified atherosclerotic plaque within a normal caliber abdominal aorta. The major branch vessels of the abdominal aorta appear patent on this non CTA examination. 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 No bulky retroperitoneal, mesenteric, pelvic or inguinal lymphadenopathy. The prostate is borderline enlarged with mass effect on the undersurface urinary bladder. Otherwise normal appearance of the urinary bladder given degree distention. There is a small amount of fluid seen within the pelvic cul-de-sac. Limited visualization of the lower thorax is degraded secondary to patient respiratory artifact. Trace bilateral pleural effusions, right greater than left, with associated dependent subpleural opacities, likely atelectasis. Cardiomegaly. Coronary artery calcifications. No pericardial effusion. No acute or  aggressive osseous abnormalities. Mild-to-moderate multilevel lumbar spine DDD, worse at L2-L3 with disc space height loss, endplate irregularity and sclerosis. Mild diffuse body wall anasarca. Regional soft tissues appear otherwise normal. IMPRESSION: 1. Cardiomegaly with findings worrisome for pulmonary edema including trace bilateral effusions, right greater than left, mild diffuse body wall anasarca and minimal amount of mesenteric stranding / third-spacing throughout the peritoneum. Clinical correlation is advised. 2. Cholelithiasis with potential minimal amount of gallbladder wall thickening and pericholecystic fluid, again, potentially attributable to suspected third-spacing within the peritoneal cavity - if clinical concern persists for acute cholecystitis, further evaluation with gallbladder ultrasound and/or nuclear medicine HIDA scan could be performed as clinically indicated. 3. Coronary artery calcifications. 4. Prostatomegaly. Electronically Signed   By: Sandi Mariscal M.D.   On: 12/07/2014 11:43     Assessment and Plan  35M with HTN, HLD, hypothyroidism, CAD s/p CABG 1991, PAF, dementia/sundowning admitted 12/07/14 with profound iron deficiency anemia Hgb 4.5, melena, with new diagnosis of invasive adenocarcinoma of colon s/p lap R colectomy on 12/15/14. Cardiology following for PAF, acute on chronic diastolic CHF, and CAD. 2D Echo 12/09/14: EF 60-65%, no RWMA, mild MR, LA mod-severely dilated.  1. Profound iron deficiency anemia with newly diagnosed colon CA - per IM.  2. Acute on chronic diastolic CHF - weights inconsistent but overall downtrend noted. -5.5L and laying flat in bed without rales or edema. Will change to oral Lasix today.  3. Paroxysmal atrial fibrillation - not a candidate for anticoag due to GI bleed - also not on anticoag prior to admission due to fall risk. Initial  cardiology notes indicate he was in bouts of NSR closer to time of admission, so this is PAF, not chronic AF.  He had issues with bradycardia earlier this admission (HR 30s-40s) which has resolved. He just started getting Coreg yesterday due to being NPO previously. Will discuss plans for IV amiodarone with MD. He remains in AF but rate controlled - HR 80s on Coreg/Amio.  4. CAD s/p CABG 1991 - no angina. Antiplatelets on hold due to #1.    Signed, Melina Copa PA-C Pager: [phone]

## 2014-12-19 NOTE — Care Management Important Message (Signed)
Important Message  Patient Details  Name: Jereld Presti MRN: [id number] Date of Birth: 1925-12-26   Medicare Important Message Given:  Yes-third notification given    Camillo Flaming 12/19/2014, 2:36 Short Message  Patient Details  Name: Montez Cuda MRN: [id number] Date of Birth: Nov 23, 1925   Medicare Important Message Given:  Yes-third notification given    Camillo Flaming 12/19/2014, 2:36 PM

## 2014-12-20 DIAGNOSIS — M1990 Unspecified osteoarthritis, unspecified site: Secondary | ICD-10-CM

## 2014-12-20 LAB — CBC
HEMATOCRIT: 30.1 % — AB (ref 39.0–52.0)
Hemoglobin: 9.3 g/dL — ABNORMAL LOW (ref 13.0–17.0)
MCH: 24.5 pg — ABNORMAL LOW (ref 26.0–34.0)
MCHC: 30.9 g/dL (ref 30.0–36.0)
MCV: 79.4 fL (ref 78.0–100.0)
PLATELETS: 369 10*3/uL (ref 150–400)
RBC: 3.79 MIL/uL — AB (ref 4.22–5.81)
RDW: 20.2 % — AB (ref 11.5–15.5)
WBC: 6.2 10*3/uL (ref 4.0–10.5)

## 2014-12-20 LAB — BASIC METABOLIC PANEL
Anion gap: 7 (ref 5–15)
BUN: 27 mg/dL — AB (ref 6–20)
CHLORIDE: 102 mmol/L (ref 101–111)
CO2: 28 mmol/L (ref 22–32)
Calcium: 8.4 mg/dL — ABNORMAL LOW (ref 8.9–10.3)
Creatinine, Ser: 1.16 mg/dL (ref 0.61–1.24)
GFR, EST NON AFRICAN AMERICAN: 55 mL/min — AB (ref >60)
Glucose, Bld: 97 mg/dL (ref 65–99)
POTASSIUM: 3.1 mmol/L — AB (ref 3.5–5.1)
SODIUM: 137 mmol/L (ref 135–145)

## 2014-12-20 LAB — MAGNESIUM: MAGNESIUM: 1.9 mg/dL (ref 1.7–2.4)

## 2014-12-20 LAB — GLUCOSE, CAPILLARY: Glucose-Capillary: 95 mg/dL (ref 65–99)

## 2014-12-20 MED ORDER — POTASSIUM CHLORIDE CRYS ER 20 MEQ PO TBCR
40.0000 meq | EXTENDED_RELEASE_TABLET | Freq: Two times a day (BID) | ORAL | Status: AC
  Administered 2014-12-20 (×2): 40 meq via ORAL
  Filled 2014-12-20: qty 2

## 2014-12-20 MED ORDER — LEVOFLOXACIN 750 MG PO TABS
750.0000 mg | ORAL_TABLET | Freq: Every day | ORAL | Status: DC
  Filled 2014-12-20: qty 1

## 2014-12-20 MED ORDER — ENSURE ENLIVE PO LIQD
237.0000 mL | Freq: Two times a day (BID) | ORAL | Status: DC
  Administered 2014-12-20 – 2014-12-21 (×2): 237 mL via ORAL

## 2014-12-20 NOTE — Progress Notes (Signed)
Occupational Therapy Treatment Patient Details Name: Jesse Burton MRN: [id number] DOB: Aug 08, 1925 Today's Date: 12/20/2014    History of present illness 81 y.o. male admitted with GI bleed, anemia. Hx of dementia, Afib, HTN. Pt is from [facility]-long term care.   OT comments  RN aware of redness noted on pts LUE forearm area.  Follow Up Recommendations  SNF    Equipment Recommendations  None recommended by OT    Recommendations for Other Services      Precautions / Restrictions Precautions Precautions: Fall Restrictions Weight Bearing Restrictions: No       Mobility Bed Mobility Overal bed mobility: Needs Assistance             General bed mobility comments: NT  Transfers                 General transfer comment: NT        ADL   Eating/Feeding: Minimal assistance;Bed level Eating/Feeding Details (indicate cue type and reason): HOB raised Grooming: Wash/dry face;Oral care;Sitting;Minimal assistance;Cueing for safety;Cueing for sequencing                                 General ADL Comments: Pt not willing to sit EOB at this time but did agree to grooming and BUE AROM. OT did note that pt had reddened area that was warm on LUE lower arm area. RN present and aware. Redness noted when pt performing AROM BUE shoulder flexion, elbow flexion as well as wrist and hand ROM       Cognition   Behavior During Therapy: WFL for tasks assessed/performed Overall Cognitive Status: History of cognitive impairments - at baseline                                 Plan Discharge plan remains appropriate       End of Session     Activity Tolerance Patient tolerated treatment well   Patient Left in bed;with call bell/phone within reach;with bed alarm set   Nurse Communication           Payton Mccallum D 12/20/2014, 11:07 AM

## 2014-12-20 NOTE — Progress Notes (Signed)
PHARMACIST - PHYSICIAN COMMUNICATION DR:   [facility] CONCERNING: Antibiotic IV to Oral Route Change Policy  RECOMMENDATION: This patient is receiving levofloxacin by the intravenous route.  Based on criteria approved by the Pharmacy and Therapeutics Committee, the antibiotic(s) is/are being converted to the equivalent oral dose form(s).   DESCRIPTION: These criteria include:  Patient being treated for a respiratory tract infection, urinary tract infection, cellulitis or clostridium difficile associated diarrhea if on metronidazole  The patient is not neutropenic and does not exhibit a GI malabsorption state  The patient is eating (either orally or via tube) and/or has been taking other orally administered medications for a least 24 hours. Diet advanced to soft 12/20/2014  The patient is improving clinically and has a Tmax < 100.5  Continue with original stop date of 11/3  If you have questions about this conversion, please contact the Pharmacy Department  []   [phone] )  [facility] []   [phone] )  [facility] []   [phone] )  [facility] []   [phone] )  [facility] [x]   [phone] )  [place], PharmD, BCPS.   Pager: [phone] 12/20/2014 10:43 AM

## 2014-12-20 NOTE — Progress Notes (Signed)
Patient Profile: 55M with HTN, HLD, hypothyroidism, CAD s/p CABG 1991, PAF, dementia/sundowning admitted 12/07/14 with profound iron deficiency anemia Hgb 4.5, melena, with new diagnosis of invasive adenocarcinoma of colon s/p lap R colectomy on 12/15/14. Cardiology following for atrial fibrillation (now persistent), acute on chronic diastolic CHF, and CAD. 2D Echo 12/09/14: EF 60-65%, no RWMA, mild MR, LA mod-severely dilated.  Subjective: Demented but appears to be resting comfortably, in no distress.   Objective: Vital signs in last 24 hours: Temp:  [98.1 F (36.7 C)-98.3 F (36.8 C)] 98.1 F (36.7 C) (11/01 0436) Pulse Rate:  [64-90] 79 (11/01 0436) Resp:  [18-19] 18 (11/01 0436) BP: (103-126)/(59-71) 113/71 mmHg (11/01 0436) SpO2:  [94 %-100 %] 95 % (11/01 0436) Last BM Date: 12/15/14  Intake/Output from previous day: 10/31 0701 - 11/01 0700 In: 1504.2 [P.O.:1080; I.V.:274.2; IV Piggyback:150] Out: 750 [Urine:750] Intake/Output this shift:    Medications Current Facility-Administered Medications  Medication Dose Route Frequency Provider Last Rate Last Dose  . acetaminophen (TYLENOL) tablet 325-650 mg  325-650 mg Oral Q6H PRN Stark Klein, MD      . amiodarone (PACERONE) tablet 200 mg  200 mg Oral Daily Sueanne Margarita, MD   200 mg at 12/19/14 1126  . carvedilol (COREG) tablet 3.125 mg  3.125 mg Oral BID WC Josue Hector, MD   3.125 mg at 12/19/14 1708  . enoxaparin (LOVENOX) injection 40 mg  40 mg Subcutaneous Q24H Berton Mount, RPH   40 mg at 12/19/14 1026  . erythromycin ophthalmic ointment 1 application  1 application Right Eye QHS Ivor Costa, MD   1 application at 09/40/76 2212  . furosemide (LASIX) tablet 40 mg  40 mg Oral Daily Dayna N Dunn, PA-C   40 mg at 12/19/14 0902  . levalbuterol (XOPENEX) nebulizer solution 0.63 mg  0.63 mg Nebulization Q6H PRN Dianne Dun, NP      . levofloxacin (LEVAQUIN) IVPB 750 mg  750 mg Intravenous Q24H Theodis Blaze,  MD   750 mg at 12/20/14 0507  . levothyroxine (SYNTHROID, LEVOTHROID) tablet 88 mcg  88 mcg Oral QAC breakfast Theodis Blaze, MD   88 mcg at 12/19/14 0902  . metoprolol (LOPRESSOR) injection 5 mg  5 mg Intravenous Q6H PRN Theodis Blaze, MD   5 mg at 12/16/14 1619  . morphine 2 MG/ML injection 0.5-1 mg  0.5-1 mg Intravenous Q4H PRN Stark Klein, MD   1 mg at 12/19/14 1336  . multivitamin with minerals tablet 1 tablet  1 tablet Oral Daily Ivor Costa, MD   1 tablet at 12/19/14 0902  . mycophenolate (CELLCEPT) capsule 500 mg  500 mg Oral Daily Ivor Costa, MD   500 mg at 12/19/14 0902  . ondansetron (ZOFRAN) tablet 4 mg  4 mg Oral Q6H PRN Ivor Costa, MD       Or  . ondansetron [facility]) injection 4 mg  4 mg Intravenous Q6H PRN Ivor Costa, MD      . sodium chloride 0.9 % injection 3 mL  3 mL Intravenous Q12H Ivor Costa, MD   3 mL at 12/18/14 2200    PE: General appearance: alert, cooperative and no distress Neck: no carotid bruit and no JVD Lungs: clear to auscultation bilaterally Heart: irregularly irregular rhythm and regular rate Extremities: no LEE Pulses: 2+ and symmetric Skin: warm and dry Neurologic: dementia  Lab Results:   Recent Labs  12/18/14 0507 12/19/14 0539 12/20/14 0510  WBC 8.5 7.5  6.2  HGB 9.2* 10.0* 9.3*  HCT 30.1* 33.0* 30.1*  PLT 332 342 369   BMET  Recent Labs  12/18/14 0507 12/19/14 0539 12/20/14 0510  NA 141 139 137  K 3.5 3.8 3.1*  CL 108 104 102  CO2 28 27 28   GLUCOSE 114* 109* 97  BUN 17 21* 27*  CREATININE 0.96 1.04 1.16  CALCIUM 8.9 8.9 8.4*     Assessment/Plan  Principal Problem:   Colonic mass Active Problems:   Guaiac positive stools   Microcytic anemia   Arthritis   Hypertension   Hypothyroidism   Retinitis   GIB (gastrointestinal bleeding)   Abdominal distention   Essential hypertension   PAF (paroxysmal atrial fibrillation) (HCC)   Iron deficiency anemia due to chronic blood loss   Melena   Anemia, iron deficiency   Heme  positive stool   Encounter for monitoring antiplatelet therapy   Colon cancer ([place]) - suspect transverse colon   Hx of CABG   Pre-operative cardiovascular examination   Acute on chronic diastolic (congestive) heart failure (HCC)   Leukocytosis   Thyroid activity decreased   1. Persistent Atrial Fibrillation:  He remains in Afib but with a CVR. Continue 200 mg of amiodarone daily and 3.125 mg of Coreg BID. Not a candidate for long term anticoagulation due to frequent falls and colon CA. Continue to treat his anemia, which could also exacerbate his arrhthymia.   2. Acute on Chronic Diastolic CHF: normal EF on recent echo. Euvolomeic on exam. Resting in the supine position w/o dyspnea. Continue PO lasix. Correct hypokalemia.   3. Profound Iron Deficiency Anemia: in the setting of newly diagnosed colon CA. Management per IM.  4. Colon Cancer: s/p laparoscopic partial colectomy 12/15/14.   5. CAD: s/p CABG 1991 - no angina. Antiplatelets on hold due to #3.   6. Hypokalemia: K is 3.1. Replete with supplemental K.      LOS: 13 days    Brittainy M. Rosita Fire, PA-C 12/20/2014 7:41 AM

## 2014-12-20 NOTE — Progress Notes (Signed)
Patient ID: Jesse Burton, male   DOB: Oct 10, 1925, 81 y.o.   MRN: [id number]  [facility] PROGRESS NOTE  Infant Zink EGB:[id number] DOB: April 08, 1925 DOA: 12/07/2014 PCP: No primary care provider on file.   Brief narrative:     81 y.o. male with PMH of Dementia, hypertension, hyperlipidemia, hypothyroidism, arthritis, CAD/CABG, retinitis, BPH, atrial fibrillation (not on anticoagulation), who presented with low hemoglobin. He was brought in ED after he was found to have low Hgb of 4.5 in [facility]. Since admission, seen by GI, plavix was stopped and received 3 units PRBC since then his Hb has been stable in the 7.8 range, underwent Colonoscopy 10/24-transverse colon mass. On 10/25 [facility] was consulted.  Assessment/Plan:    Iron deficiency anemia/Colon mass - held plavix, s/p 3 units PRBC on day 1, Plavix since held - pt underwent colonoscopy on 10/24, noted transverse colon mass likely source of bleeding - underwent laparoscopic right colectomy 10/27, confused post op but remains clinically stable  - Pathology pos for invasive adenocarcinoma  - Oncology consulted with recs of no role in systemic chemo or radiation therapy - diet per surgery team, possible d/c in next 24 - 48 hours   Leukocytosis - post op related - CXR with concern for PNA, multifocal  - today is Levaquin day #3/7, WBC is now WNL and pt remains afebrile   Hypokalemia - supplement and repeat BMP in AM  Acute Dementia with sundowning - Cont low dose Haldol PRN if needed, re-orientation techniques - pt is NAD this AM, sitting in chair   Acute on chronic Diastolic CHF with hypoxia - ECHO with preserved EF - per cardio, continue with lasix  - CHF appears to be resolved   CAD:  - s/p of CABG in 1991 - stable, holding plavix   Arthritis: - Continued tylenol and tramadol if needed   Retinitis: - Continued on cellcept - continue home eye drops   Atrial Fibrillation:  - CHA2DS2-VASc  Score is 3, which indicates pt would normally benefit from anticoagulation, however, pt was not on anticoagulation at home, likely secondary to old age and high risk of fall.  - HR controlled on Coreg and amiodarone  - pt not candidate for [facility]  HLD:  -Continue Lipitor  Hypothyroidism -Continue Synthroid  BPH - stable - Continue Flomax  DVT prophylaxis - Lovenox SQ  Code Status: Full.  Family Communication:  plan of care discussed with daughter at bedside 10/30, no family at bedside this AM Disposition Plan: SNF in 24 - 45 hours   IV access:  Peripheral IV  Procedures and diagnostic studies:    Ct Abdomen Pelvis W Contrast 12/07/2014  Cardiomegaly with findings worrisome for pulmonary edema including trace bilateral effusions, right greater than left, mild diffuse body wall anasarca and minimal amount of mesenteric stranding / third-spacing throughout the peritoneum. Clinical correlation is advised. 2. Cholelithiasis with potential minimal amount of gallbladder wall thickening and pericholecystic fluid, again, potentially attributable to suspected third-spacing within the peritoneal cavity - if clinical concern persists for acute cholecystitis, further evaluation with gallbladder ultrasound and/or nuclear medicine HIDA scan could be performed as clinically indicated. 3. Coronary artery calcifications. 4. Prostatomegaly.   Dg Chest 2 View Cardiomegaly with bilateral central airspace disease and moderate effusions suggest congestive heart failure. Cannot exclude multifocal pneumonia. 2. RIGHT suprahilar density likely represents airspace disease.  Medical Consultants:   Cardiology  Oncology  General Surgery  GI  Other Consultants:   None  IAnti-Infectives:    Levaquin 10/30 --> 11/05  Faye Ramsay, MD  [facility] Pager [phone]  If 7PM-7AM, please contact night-coverage [email or web address] Password [email or web address] 12/20/2014, 5:56 PM   LOS: 13 days   HPI/Subjective: No events  overnight. Intermittently confused overnight   Objective: Filed Vitals:   12/20/14 0436 12/20/14 0816 12/20/14 0833 12/20/14 1345  BP: 113/71 112/96  118/78  Pulse: 79 74  77  Temp: 98.1 F (36.7 C)   98.4 F (36.9 C)  TempSrc: Axillary   Oral  Resp: 18   18  Height:      Weight:   86.4 kg (190 lb 7.6 oz)   SpO2: 95%   97%    Intake/Output Summary (Last 24 hours) at 12/20/14 1756 Last data filed at 12/20/14 1748  Gross per 24 hour  Intake   1110 ml  Output    650 ml  Net    460 ml    Exam:   General:  Pt is alert, confused, NAD  Cardiovascular: Irregular rate and rhythm, no rubs, no gallops  Respiratory: no crackles, diminished breath sounds at bases   Abdomen: Soft, non tender, non distended, bowel sounds present, no guarding  Data Reviewed: Basic Metabolic Panel:  Recent Labs Lab 12/16/14 0551 12/17/14 0508 12/18/14 0507 12/19/14 0539 12/20/14 0510  NA 140 144 141 139 137  K 4.3 4.0 3.5 3.8 3.1*  CL 111 107 108 104 102  CO2 23 25 28 27 28   GLUCOSE 152* 123* 114* 109* 97  BUN 11 11 17  21* 27*  CREATININE 1.06 1.07 0.96 1.04 1.16  CALCIUM 9.1 9.3 8.9 8.9 8.4*  MG  --   --   --   --  1.9   CBC:  Recent Labs Lab 12/16/14 0551 12/17/14 0508 12/18/14 0507 12/19/14 0539 12/20/14 0510  WBC 15.3* 14.9* 8.5 7.5 6.2  HGB 8.4* 8.9* 9.2* 10.0* 9.3*  HCT 27.6* 29.7* 30.1* 33.0* 30.1*  MCV 79.8 79.6 80.7 80.3 79.4  PLT 336 352 332 342 369   CBG:  Recent Labs Lab 12/16/14 2029 12/17/14 0734 12/18/14 0730 12/19/14 0909 12/20/14 0730  GLUCAP 146* 120* 121* 144* 95   . amiodarone  200 mg Oral Daily  . carvedilol  3.125 mg Oral BID WC  . enoxaparin (LOVENOX) injection  40 mg Subcutaneous Q24H  . erythromycin  1 application Right Eye QHS  . feeding supplement (ENSURE ENLIVE)  237 mL Oral BID BM  . furosemide  40 mg Oral Daily  . [START ON 12/21/2014] levofloxacin  750 mg Oral Daily  . levothyroxine  88 mcg Oral QAC breakfast  . multivitamin with  minerals  1 tablet Oral Daily  . mycophenolate  500 mg Oral Daily  . potassium chloride  40 mEq Oral BID  . sodium chloride  3 mL Intravenous Q12H   Continuous Infusions:

## 2014-12-20 NOTE — Plan of Care (Signed)
Problem: Consults Goal: Nutrition Consult-if indicated Outcome: Completed/Met Date Met:  12/20/14 Dietician f/u with pt 11/1

## 2014-12-20 NOTE — Progress Notes (Signed)
Patient ID: Jesse Burton, male   DOB: 1926/01/26, 81 y.o.   MRN: [id number]     [facility]      [address] [place], [place] [address]    Phone: [phone] FAX: [phone]     Subjective: Pleasantly confused.  Had a BM.  No n/v.  No pain.  Ate fulls and tolerated well.    Objective:  Vital signs:  Filed Vitals:   12/19/14 2147 12/20/14 0436 12/20/14 0816 12/20/14 0833  BP: 126/71 113/71 112/96   Pulse: 90 79 74   Temp: 98.3 F (36.8 C) 98.1 F (36.7 C)    TempSrc: Oral Axillary    Resp: 19 18    Height:      Weight:    86.4 kg (190 lb 7.6 oz)  SpO2: 94% 95%      Last BM Date: 12/15/14  Intake/Output   Yesterday:  10/31 0701 - 11/01 0700 In: 1504.2 [P.O.:1080; I.V.:274.2; IV Piggyback:150] Out: 750 [Urine:750] This shift:  Total I/O In: 240 [P.O.:240] Out: -    Physical Exam: General: Pt awake/alert/oriented x2  in no acute distress  Abdomen: Soft.  Nondistended.  Non tender.  Incisions are c/d/i, dressings removed.  No evidence of peritonitis.  No incarcerated hernias.    Problem List:   Principal Problem:   Colonic mass Active Problems:   Guaiac positive stools   Microcytic anemia   Arthritis   Hypertension   Hypothyroidism   Retinitis   GIB (gastrointestinal bleeding)   Abdominal distention   Essential hypertension   PAF (paroxysmal atrial fibrillation) (HCC)   Iron deficiency anemia due to chronic blood loss   Melena   Anemia, iron deficiency   Heme positive stool   Encounter for monitoring antiplatelet therapy   Colon cancer ([place]) - suspect transverse colon   Hx of CABG   Pre-operative cardiovascular examination   Acute on chronic diastolic (congestive) heart failure (HCC)   Leukocytosis   Thyroid activity decreased    Results:   Labs: Results for orders placed or performed during the hospital encounter of 12/07/14 (from the past 48 hour(s))  Legionella Pneumophila Serogp 1  Ur Ag     Status: None   Collection Time: 12/18/14 11:00 AM  Result Value Ref Range   L. pneumophila Serogp 1 Ur Ag Negative Negative    Comment: (NOTE) Performed At: [facility] [address] [place], [place] [id number] Lindon Romp MD [id number]    Source of Sample URINE, CLEAN CATCH   Strep pneumoniae urinary antigen     Status: None   Collection Time: 12/18/14 11:00 AM  Result Value Ref Range   Strep Pneumo Urinary Antigen NEGATIVE NEGATIVE    Comment:        Infection due to S. pneumoniae cannot be absolutely ruled out since the antigen present may be below the detection limit of the test. Performed at [facility]   CBC     Status: Abnormal   Collection Time: 12/19/14  5:39 AM  Result Value Ref Range   WBC 7.5 4.0 - 10.5 K/uL   RBC 4.11 (L) 4.22 - 5.81 MIL/uL   Hemoglobin 10.0 (L) 13.0 - 17.0 g/dL   HCT 33.0 (L) 39.0 - 52.0 %   MCV 80.3 78.0 - 100.0 fL   MCH 24.3 (L) 26.0 - 34.0 pg   MCHC 30.3 30.0 - 36.0 g/dL   RDW 19.8 (H) 11.5 - 15.5 %  Platelets 342 150 - 400 K/uL  Basic metabolic panel     Status: Abnormal   Collection Time: 12/19/14  5:39 AM  Result Value Ref Range   Sodium 139 135 - 145 mmol/L   Potassium 3.8 3.5 - 5.1 mmol/L   Chloride 104 101 - 111 mmol/L   CO2 27 22 - 32 mmol/L   Glucose, Bld 109 (H) 65 - 99 mg/dL   BUN 21 (H) 6 - 20 mg/dL   Creatinine, Ser 1.04 0.61 - 1.24 mg/dL   Calcium 8.9 8.9 - 10.3 mg/dL   GFR calc non Af Amer >60 >60 mL/min   GFR calc Af Amer >60 >60 mL/min    Comment: (NOTE) The eGFR has been calculated using the CKD EPI equation. This calculation has not been validated in all clinical situations. eGFR's persistently <60 mL/min signify possible Chronic Kidney Disease.    Anion gap 8 5 - 15  Glucose, capillary     Status: Abnormal   Collection Time: 12/19/14  9:09 AM  Result Value Ref Range   Glucose-Capillary 144 (H) 65 - 99 mg/dL  Basic metabolic panel     Status: Abnormal   Collection Time:  12/20/14  5:10 AM  Result Value Ref Range   Sodium 137 135 - 145 mmol/L   Potassium 3.1 (L) 3.5 - 5.1 mmol/L    Comment: DELTA CHECK NOTED REPEATED TO VERIFY    Chloride 102 101 - 111 mmol/L   CO2 28 22 - 32 mmol/L   Glucose, Bld 97 65 - 99 mg/dL   BUN 27 (H) 6 - 20 mg/dL   Creatinine, Ser 1.16 0.61 - 1.24 mg/dL   Calcium 8.4 (L) 8.9 - 10.3 mg/dL   GFR calc non Af Amer 55 (L) >60 mL/min   GFR calc Af Amer >60 >60 mL/min    Comment: (NOTE) The eGFR has been calculated using the CKD EPI equation. This calculation has not been validated in all clinical situations. eGFR's persistently <60 mL/min signify possible Chronic Kidney Disease.    Anion gap 7 5 - 15  CBC     Status: Abnormal   Collection Time: 12/20/14  5:10 AM  Result Value Ref Range   WBC 6.2 4.0 - 10.5 K/uL   RBC 3.79 (L) 4.22 - 5.81 MIL/uL   Hemoglobin 9.3 (L) 13.0 - 17.0 g/dL   HCT 30.1 (L) 39.0 - 52.0 %   MCV 79.4 78.0 - 100.0 fL   MCH 24.5 (L) 26.0 - 34.0 pg   MCHC 30.9 30.0 - 36.0 g/dL   RDW 20.2 (H) 11.5 - 15.5 %   Platelets 369 150 - 400 K/uL  Glucose, capillary     Status: None   Collection Time: 12/20/14  7:30 AM  Result Value Ref Range   Glucose-Capillary 95 65 - 99 mg/dL    Imaging / Studies: No results found.  Medications / Allergies:  Scheduled Meds: . amiodarone  200 mg Oral Daily  . carvedilol  3.125 mg Oral BID WC  . enoxaparin (LOVENOX) injection  40 mg Subcutaneous Q24H  . erythromycin  1 application Right Eye QHS  . furosemide  40 mg Oral Daily  . levofloxacin (LEVAQUIN) IV  750 mg Intravenous Q24H  . levothyroxine  88 mcg Oral QAC breakfast  . multivitamin with minerals  1 tablet Oral Daily  . mycophenolate  500 mg Oral Daily  . sodium chloride  3 mL Intravenous Q12H   Continuous Infusions:  PRN Meds:.acetaminophen, levalbuterol, metoprolol, morphine  injection, ondansetron **OR** ondansetron (ZOFRAN) IV  Antibiotics: Anti-infectives    Start     Dose/Rate Route Frequency Ordered  Stop   12/18/14 0600  levofloxacin (LEVAQUIN) IVPB 750 mg     750 mg 100 mL/hr over 90 Minutes Intravenous Every 24 hours 12/18/14 0545 12/23/14 0559   12/15/14 1045  cefOXitin (MEFOXIN) 2 g in dextrose 5 % 50 mL IVPB     2 g 100 mL/hr over 30 Minutes Intravenous  Once 12/15/14 1014 12/15/14 1045        Assessment/Plan Adenocarcinoma of colon  POD#5 laparoscopic right colectomy--Dr. Marlou Starks  -stable, will advance his diet -leave staples in for additional  5-7 days -pathology-adenocarcinoma 2/40 positive lymph nodes.  Seen by Dr. Alen Blew this admission.  IDA-h&h stable CV-acute on chronic CHF, Afib, CAD s/p CABG.  Cards following.  Not a candidate for oral anticoagulants due to dementia/falls.   VTE prophylaxis-SCD/lovenox  Hypothyroid BPH Dementia  FEN-advance to a soft diet.  Hypokalemia-give KCL 70mq x2 doses. Add Mg.  AM BMP.  Dispo-anticipate he will be surgically stable for DC next 24 hours. Plan is SNF    [facility]rby Pian [facility] Pager [phone]([id number]-4:30P)   12/20/2014 9:28 AM

## 2014-12-20 NOTE — Progress Notes (Signed)
Initial Nutrition Assessment  DOCUMENTATION CODES:   Severe malnutrition in context of acute illness/injury  INTERVENTION:  Ensure Enlive po BID, each supplement provides 350 kcal and 20 grams of protein  NUTRITION DIAGNOSIS:   Malnutrition related to altered GI function, cancer and cancer related treatments as evidenced by percent weight loss, moderate depletion of body fat, moderate depletions of muscle mass.   GOAL:   Patient will meet greater than or equal to 90% of their needs  MONITOR:   PO intake, Supplement acceptance, Labs, I & O's, Skin  REASON FOR ASSESSMENT:   LOS    ASSESSMENT:   Jesse Burton is a 81 y.o. male with PMH of hypertension, hyperlipidemia, hypothyroidism, arthritis, CAD (as A/P CABG), retinitis, BPH, atrial fibrillation (not on anticoagulant), who presents with low hemoglobin. Reported dark stools by daughter but no hematuria. In ED, patient was found to have expanded abdomen, positive FOBT, hgb 4.9, INR 1.26, PTT 30, creatinine 1.12, BUN 32, no tachycardia, electrolytes okay. Patient is admitted to inpatient for further evaluation and treatment.  Pt was somewhat confused, but able to answer some questions. Reports a good appetite, still eating regularly at [place] [facility].  Exhibits moderate depletion of body fat at oribtals and  Severe depletion of muscle mass through legs, scapular region, clavicles, and at temples. Mild depletion of muscle mass at hand.  In combination with 9#/4.5% wt loss in 2 weeks, pt exhibits Severe Malnutrition in Context of Acute Illness related to GI bleed, s/p laparoscopic right colectomy.  Will provide ensure enlive to promote healing/prevent weight loss.  K 3.1, BUN 27, Ca 8.4, HGB 9.3, HCT 30.1  Medications reviewed  Diet Order:  Diet full liquid Room service appropriate?: Yes; Fluid consistency:: Thin  Skin:  Wound (see comment) (Surigcal incision to abdomen.)  Last BM:  12/15/2014  Height:   Ht  Readings from Last 1 Encounters:  12/12/14 6' (1.829 m)    Weight:   Wt Readings from Last 1 Encounters:  12/20/14 190 lb 7.6 oz (86.4 kg)    Ideal Body Weight:     BMI:  Body mass index is 25.83 kg/(m^2).  Estimated Nutritional Needs:   Kcal:  2200 - 2400 calories  Protein:  105 - 125 grams  Fluid:  >/= 2.2L  EDUCATION NEEDS:   No education needs identified at this time  Satira Anis. Abdulhamid Olgin, MS, RD LDN After Hours/Weekend Pager [phone]

## 2014-12-20 NOTE — Progress Notes (Signed)
Updated [facility] rep of possible SNF dc tomorrow- CSW will follow up tomorrow to further assist with [place], MSW, [place]

## 2014-12-21 DIAGNOSIS — D509 Iron deficiency anemia, unspecified: Secondary | ICD-10-CM | POA: Diagnosis not present

## 2014-12-21 DIAGNOSIS — I25700 Atherosclerosis of coronary artery bypass graft(s), unspecified, with unstable angina pectoris: Secondary | ICD-10-CM | POA: Diagnosis not present

## 2014-12-21 DIAGNOSIS — R1312 Dysphagia, oropharyngeal phase: Secondary | ICD-10-CM | POA: Diagnosis not present

## 2014-12-21 DIAGNOSIS — K598 Other specified functional intestinal disorders: Secondary | ICD-10-CM | POA: Diagnosis not present

## 2014-12-21 DIAGNOSIS — I2510 Atherosclerotic heart disease of native coronary artery without angina pectoris: Secondary | ICD-10-CM | POA: Diagnosis not present

## 2014-12-21 DIAGNOSIS — R2681 Unsteadiness on feet: Secondary | ICD-10-CM | POA: Diagnosis not present

## 2014-12-21 DIAGNOSIS — R29898 Other symptoms and signs involving the musculoskeletal system: Secondary | ICD-10-CM | POA: Diagnosis not present

## 2014-12-21 DIAGNOSIS — E876 Hypokalemia: Secondary | ICD-10-CM | POA: Diagnosis not present

## 2014-12-21 DIAGNOSIS — I10 Essential (primary) hypertension: Secondary | ICD-10-CM | POA: Diagnosis not present

## 2014-12-21 DIAGNOSIS — K922 Gastrointestinal hemorrhage, unspecified: Secondary | ICD-10-CM | POA: Diagnosis not present

## 2014-12-21 DIAGNOSIS — M6281 Muscle weakness (generalized): Secondary | ICD-10-CM | POA: Diagnosis not present

## 2014-12-21 DIAGNOSIS — Z9049 Acquired absence of other specified parts of digestive tract: Secondary | ICD-10-CM | POA: Diagnosis not present

## 2014-12-21 DIAGNOSIS — R140 Abdominal distension (gaseous): Secondary | ICD-10-CM

## 2014-12-21 DIAGNOSIS — N182 Chronic kidney disease, stage 2 (mild): Secondary | ICD-10-CM | POA: Diagnosis not present

## 2014-12-21 DIAGNOSIS — I480 Paroxysmal atrial fibrillation: Secondary | ICD-10-CM | POA: Diagnosis not present

## 2014-12-21 DIAGNOSIS — R413 Other amnesia: Secondary | ICD-10-CM | POA: Diagnosis not present

## 2014-12-21 DIAGNOSIS — D010 Carcinoma in situ of colon: Secondary | ICD-10-CM | POA: Diagnosis not present

## 2014-12-21 DIAGNOSIS — I5033 Acute on chronic diastolic (congestive) heart failure: Secondary | ICD-10-CM | POA: Diagnosis not present

## 2014-12-21 DIAGNOSIS — I5032 Chronic diastolic (congestive) heart failure: Secondary | ICD-10-CM | POA: Diagnosis not present

## 2014-12-21 DIAGNOSIS — K6389 Other specified diseases of intestine: Secondary | ICD-10-CM | POA: Diagnosis not present

## 2014-12-21 LAB — CBC
HCT: 28.1 % — ABNORMAL LOW (ref 39.0–52.0)
Hemoglobin: 8.8 g/dL — ABNORMAL LOW (ref 13.0–17.0)
MCH: 24.4 pg — AB (ref 26.0–34.0)
MCHC: 31.3 g/dL (ref 30.0–36.0)
MCV: 77.8 fL — ABNORMAL LOW (ref 78.0–100.0)
Platelets: 336 10*3/uL (ref 150–400)
RBC: 3.61 MIL/uL — ABNORMAL LOW (ref 4.22–5.81)
RDW: 20 % — ABNORMAL HIGH (ref 11.5–15.5)
WBC: 8.2 10*3/uL (ref 4.0–10.5)

## 2014-12-21 LAB — BASIC METABOLIC PANEL
Anion gap: 6 (ref 5–15)
BUN: 22 mg/dL — AB (ref 6–20)
CALCIUM: 8.4 mg/dL — AB (ref 8.9–10.3)
CO2: 26 mmol/L (ref 22–32)
CREATININE: 1.11 mg/dL (ref 0.61–1.24)
Chloride: 104 mmol/L (ref 101–111)
GFR calc Af Amer: >60 mL/min (ref >60)
GFR, EST NON AFRICAN AMERICAN: 58 mL/min — AB (ref >60)
GLUCOSE: 98 mg/dL (ref 65–99)
Potassium: 3.2 mmol/L — ABNORMAL LOW (ref 3.5–5.1)
Sodium: 136 mmol/L (ref 135–145)

## 2014-12-21 LAB — C DIFFICILE QUICK SCREEN W PCR REFLEX
C DIFFICLE (CDIFF) ANTIGEN: NEGATIVE
C Diff interpretation: NEGATIVE
C Diff toxin: NEGATIVE

## 2014-12-21 MED ORDER — AMIODARONE HCL 200 MG PO TABS: 200.0000 mg | ORAL_TABLET | Freq: Every day | ORAL | Status: DC

## 2014-12-21 MED ORDER — ENSURE ENLIVE PO LIQD: 237.0000 mL | Freq: Two times a day (BID) | ORAL | Status: DC

## 2014-12-21 MED ORDER — LEVALBUTEROL HCL 0.63 MG/3ML IN NEBU: 0.6300 mg | INHALATION_SOLUTION | Freq: Four times a day (QID) | RESPIRATORY_TRACT | Status: DC | PRN

## 2014-12-21 MED ORDER — CARVEDILOL 3.125 MG PO TABS: 3.1250 mg | ORAL_TABLET | Freq: Two times a day (BID) | ORAL | Status: AC

## 2014-12-21 MED ORDER — POTASSIUM CHLORIDE CRYS ER 20 MEQ PO TBCR
20.0000 meq | EXTENDED_RELEASE_TABLET | Freq: Once | ORAL | Status: AC
  Administered 2014-12-21: 20 meq via ORAL
  Filled 2014-12-21: qty 1

## 2014-12-21 MED ORDER — FUROSEMIDE 40 MG PO TABS: 40.0000 mg | ORAL_TABLET | Freq: Every day | ORAL | Status: DC

## 2014-12-21 MED ORDER — POTASSIUM CHLORIDE CRYS ER 20 MEQ PO TBCR
40.0000 meq | EXTENDED_RELEASE_TABLET | Freq: Once | ORAL | Status: AC
  Administered 2014-12-21: 40 meq via ORAL
  Filled 2014-12-21: qty 2

## 2014-12-21 MED ORDER — POTASSIUM CHLORIDE CRYS ER 20 MEQ PO TBCR: 20.0000 meq | EXTENDED_RELEASE_TABLET | Freq: Every day | ORAL | Status: DC

## 2014-12-21 NOTE — Discharge Instructions (Signed)

## 2014-12-21 NOTE — Progress Notes (Signed)
CSW updated SNF that patient is now R/O cdiff- anticipate hold on dc until cultures rec'd and cleared.  Eduard Clos, MSW, [place]

## 2014-12-21 NOTE — Progress Notes (Signed)
Pt medically stable for d/c. Pt / daughter are in agreement with d/c back to [facility]. [facility] transport required. Pt / daughter are aware that out of pocket costs may be associated with [facility] transport. D/C summary sent to SNF for review prior to d/c. Scripts included in d/c packet.  Werner Lean LCSW [phone]

## 2014-12-21 NOTE — Discharge Summary (Signed)
Physician Discharge Summary  Jesse Burton ELF:[id number] DOB: 1925-05-08 DOA: 12/07/2014  PCP: No primary care provider on file.  Admit date: 12/07/2014 Discharge date: 12/21/2014  Recommendations for Outpatient Follow-up:  1. Pt will need to follow up with PCP in 2-3 weeks post discharge 2. Please obtain BMP to evaluate electrolytes and kidney function 3. Please also check CBC to evaluate Hg and Hct levels 4. Please note that plavix was stopped due to risk of bleeding 5. Pt was started on Amiodarone and Coreg for rate control  6. Please note that per cardiology team, digoxin was also stopped as pt now on Amiodarone  7. Per surgery, leave staples in for another week. 8. Outpatient follow-up with Dr. Autumn Messing (surgery) and Dr. Alen Blew (oncology)  9. Also needs follow up with Dr. Johnsie Cancel in 2 weeks, cardiology    Discharge Diagnoses:  Principal Problem:   Colonic mass Active Problems:   Guaiac positive stools   Microcytic anemia   Arthritis   Hypertension   Hypothyroidism   Retinitis   GIB (gastrointestinal bleeding)   Abdominal distention   Essential hypertension   PAF (paroxysmal atrial fibrillation) (HCC)   Iron deficiency anemia due to chronic blood loss   Melena   Anemia, iron deficiency   Heme positive stool   Encounter for monitoring antiplatelet therapy   Colon cancer ([place]) - suspect transverse colon   Hx of CABG   Pre-operative cardiovascular examination   Acute on chronic diastolic (congestive) heart failure (HCC)   Leukocytosis   Thyroid activity decreased  Discharge Condition: Stable  Diet recommendation: Soft dys III diet     Brief narrative:    81 y.o. male with PMH of Dementia, hypertension, hyperlipidemia, hypothyroidism, arthritis, CAD/CABG, retinitis, BPH, atrial fibrillation (not on anticoagulation), who presented with low hemoglobin. He was brought in ED after he was found to have low Hgb of 4.5 in [facility]. Since admission,  seen by GI, plavix was stopped and received 3 units PRBC since then his Hb has been stable in the 7.8 range, underwent Colonoscopy 10/24-transverse colon mass. On 10/25 CCS was consulted.  Assessment/Plan:    Iron deficiency anemia/Colon mass - held plavix, s/p 3 units PRBC on day 1, Plavix since held - pt underwent colonoscopy on 10/24, noted transverse colon mass likely source of bleeding - underwent laparoscopic right colectomy 10/27, confused post op but remains clinically stable  - Pathology pos for invasive adenocarcinoma  - Oncology consulted with recs of no role in systemic chemo or radiation therapy - diet soft, tolerated well   Leukocytosis - post op related - CXR with concern for PNA, multifocal  - today is Levaquin day #4, pt with diarrhea, will stop Levaquin today  - pt asymptomatic, no need for continuation of ABX  Hypokalemia - continue to supplement   Acute Dementia with sundowning - pt is NAD this AM, sitting in chair   Acute on chronic Diastolic CHF with hypoxia - ECHO with preserved EF - per cardio, continue with lasix  - CHF appears to be resolved   CAD:  - s/p of CABG in 1991 - stable, holding plavix   Arthritis: - Continued tylenol and tramadol if needed   Retinitis: - Continued on cellcept - continue home eye drops   Atrial Fibrillation:  - CHA2DS2-VASc Score is 3, which indicates pt would normally benefit from anticoagulation, however, pt was not on anticoagulation at home, likely secondary to old age and high risk of fall.  -  HR controlled on Coreg and amiodarone  - pt not candidate for AC  HLD:  -Continue Lipitor  Hypothyroidism -Continue Synthroid  BPH - stable - Continue Flomax  Code Status: Full.  Family Communication: plan of care discussed with daughter at bedside 10/30, no family at bedside this AM Disposition Plan: SNF   IV access:  Peripheral IV  Procedures and diagnostic studies:   Ct Abdomen  Pelvis W Contrast 12/07/2014 Cardiomegaly with findings worrisome for pulmonary edema including trace bilateral effusions, right greater than left, mild diffuse body wall anasarca and minimal amount of mesenteric stranding / third-spacing throughout the peritoneum. Clinical correlation is advised. 2. Cholelithiasis with potential minimal amount of gallbladder wall thickening and pericholecystic fluid, again, potentially attributable to suspected third-spacing within the peritoneal cavity - if clinical concern persists for acute cholecystitis, further evaluation with gallbladder ultrasound and/or nuclear medicine HIDA scan could be performed as clinically indicated. 3. Coronary artery calcifications. 4. Prostatomegaly.   Dg Chest 2 View Cardiomegaly with bilateral central airspace disease and moderate effusions suggest congestive heart failure. Cannot exclude multifocal pneumonia. 2. RIGHT suprahilar density likely represents airspace disease.  Medical Consultants:   Cardiology  Oncology  General Surgery  GI  Other Consultants:   None  IAnti-Infectives:    Levaquin 10/30 --> 11/02       Discharge Exam: Filed Vitals:   12/21/14 0500  BP: 115/66  Pulse: 93  Temp: 97.6 F (36.4 C)  Resp: 20   Filed Vitals:   12/20/14 1800 12/20/14 2158 12/21/14 0457 12/21/14 0500  BP: 117/62 102/56  115/66  Pulse: 58 80  93  Temp:  97.7 F (36.5 C)  97.6 F (36.4 C)  TempSrc:  Oral  Oral  Resp:  18  20  Height:      Weight:   85.6 kg (188 lb 11.4 oz)   SpO2:  93%  98%    General: Pt is alert, not in acute distress Cardiovascular: Regular rate and rhythm, no rubs, no gallops Respiratory: Clear to auscultation bilaterally, no wheezing, no crackles, no rhonchi Abdominal: Soft, non tender, non distended, bowel sounds +, no guarding  Discharge Instructions  Discharge Instructions    Diet - low sodium heart healthy    Complete by:  As directed      Increase activity slowly     Complete by:  As directed             Medication List    STOP taking these medications        clopidogrel 75 MG tablet  Commonly known as:  PLAVIX     digoxin 0.125 MG tablet  Commonly known as:  LANOXIN     losartan 50 MG tablet  Commonly known as:  COZAAR     promethazine 25 MG tablet  Commonly known as:  PHENERGAN     traMADol 50 MG tablet  Commonly known as:  ULTRAM      TAKE these medications        acetaminophen 325 MG tablet  Commonly known as:  TYLENOL  Take 650 mg by mouth daily.     acetaminophen 325 MG tablet  Commonly known as:  TYLENOL  Take 650 mg by mouth every 6 (six) hours as needed (for mild pain.).     amiodarone 200 MG tablet  Commonly known as:  PACERONE  Take 1 tablet (200 mg total) by mouth daily.     atorvastatin 40 MG tablet  Commonly known as:  LIPITOR  Take 40 mg by mouth daily.     carvedilol 3.125 MG tablet  Commonly known as:  COREG  Take 1 tablet (3.125 mg total) by mouth 2 (two) times daily with a meal.     CEREFOLIN 07-19-48-5 MG Tabs  Take 1 tablet by mouth daily.     erythromycin ophthalmic ointment  Place 1 application into the right eye at bedtime.     feeding supplement (ENSURE ENLIVE) Liqd  Take 237 mLs by mouth 2 (two) times daily between meals.     furosemide 40 MG tablet  Commonly known as:  LASIX  Take 1 tablet (40 mg total) by mouth daily.     Glucosamine-Chondroitin 750-600 MG Tabs  Take 2 tablets by mouth daily.     l-methylfolate-B6-B12 3-35-2 MG Tabs tablet  Commonly known as:  METANX  Take 1 tablet by mouth daily.     levalbuterol 0.63 MG/3ML nebulizer solution  Commonly known as:  XOPENEX  Take 3 mLs (0.63 mg total) by nebulization every 6 (six) hours as needed for wheezing or shortness of breath.     levothyroxine 88 MCG tablet  Commonly known as:  SYNTHROID, LEVOTHROID  Take 88 mcg by mouth daily before breakfast.     multivitamin with minerals Tabs tablet  Take 1 tablet by mouth daily.  CERTA-VITE SENIOR     mycophenolate 500 MG tablet  Commonly known as:  CELLCEPT  Take 500 mg by mouth daily.     polyethylene glycol packet  Commonly known as:  MIRALAX / GLYCOLAX  Take 17 g by mouth daily.     potassium chloride SA 20 MEQ tablet  Commonly known as:  K-DUR,KLOR-CON  Take 1 tablet (20 mEq total) by mouth daily.     tamsulosin 0.4 MG Caps capsule  Commonly known as:  FLOMAX  Take 0.8 mg by mouth daily.            Follow-up Information    Follow up with Melina Copa, PA-C On 01/03/2015.   Specialties:  Cardiology, Radiology   Why:  1:30 pm (cardiology follow-up)   Contact information:   [address] [place] [address] [phone]        The results of significant diagnostics from this hospitalization (including imaging, microbiology, ancillary and laboratory) are listed below for reference.     Microbiology: Recent Results (from the past 240 hour(s))  C difficile quick scan w PCR reflex     Status: None   Collection Time: 12/21/14  9:53 AM  Result Value Ref Range Status   C Diff antigen NEGATIVE NEGATIVE Final   C Diff toxin NEGATIVE NEGATIVE Final   C Diff interpretation Negative for toxigenic C. difficile  Final     Labs: Basic Metabolic Panel:  Recent Labs Lab 12/17/14 0508 12/18/14 0507 12/19/14 0539 12/20/14 0510 12/21/14 0500  NA 144 141 139 137 136  K 4.0 3.5 3.8 3.1* 3.2*  CL 107 108 104 102 104  CO2 25 28 27 28 26   GLUCOSE 123* 114* 109* 97 98  BUN 11 17 21* 27* 22*  CREATININE 1.07 0.96 1.04 1.16 1.11  CALCIUM 9.3 8.9 8.9 8.4* 8.4*  MG  --   --   --  1.9  --    Liver Function Tests: No results for input(s): AST, ALT, ALKPHOS, BILITOT, PROT, ALBUMIN in the last 168 hours. No results for input(s): LIPASE, AMYLASE in the last 168 hours. No results for input(s): AMMONIA in the last  168 hours. CBC:  Recent Labs Lab 12/17/14 0508 12/18/14 0507 12/19/14 0539 12/20/14 0510 12/21/14 0500  WBC  14.9* 8.5 7.5 6.2 8.2  HGB 8.9* 9.2* 10.0* 9.3* 8.8*  HCT 29.7* 30.1* 33.0* 30.1* 28.1*  MCV 79.6 80.7 80.3 79.4 77.8*  PLT 352 332 342 369 336   Cardiac Enzymes: No results for input(s): CKTOTAL, CKMB, CKMBINDEX, TROPONINI in the last 168 hours. BNP: BNP (last 3 results)  Recent Labs  12/07/14 0423 12/17/14 0508  BNP 630.8* 877.1*    ProBNP (last 3 results) No results for input(s): PROBNP in the last 8760 hours.  CBG:  Recent Labs Lab 12/16/14 2029 12/17/14 0734 12/18/14 0730 12/19/14 0909 12/20/14 0730  GLUCAP 146* 120* 121* 144* 95     SIGNED: Time coordinating discharge: 30 minutes  MAGICK-Ronelle Smallman, MD  [facility] 12/21/2014, 11:45 AM Pager [phone]  If 7PM-7AM, please contact night-coverage [email or web address] Password [email or web address]

## 2014-12-21 NOTE — Progress Notes (Signed)
Patient ID: Jesse Burton, male   DOB: 1925-05-18, 81 y.o.   MRN: [id number]     [facility]      [address] [place], [place] [address]    Phone: [phone] FAX: [phone]     Subjective: Pleasantly confused.  Having diarrhea.  Although afebrile and no leukocytosis.  Appetite is fair. Thinks its his birthday.  Objective:  Vital signs:  Filed Vitals:   12/20/14 1800 12/20/14 2158 12/21/14 0457 12/21/14 0500  BP: 117/62 102/56  115/66  Pulse: 58 80  93  Temp:  97.7 F (36.5 C)  97.6 F (36.4 C)  TempSrc:  Oral  Oral  Resp:  18  20  Height:      Weight:   85.6 kg (188 lb 11.4 oz)   SpO2:  93%  98%    Last BM Date: 12/21/14  Intake/Output   Yesterday:  11/01 0701 - 11/02 0700 In: 720 [P.O.:720] Out: 725 [Urine:725] This shift:  Total I/O In: -  Out: 450 [Urine:450]  Physical Exam:  General: Pt awake/alert/oriented x2 in no acute distress  Abdomen: Soft. Nondistended. Non tender. Incisions are c/d/i, dressings removed. No evidence of peritonitis. No incarcerated hernias.      Problem List:   Principal Problem:   Colonic mass Active Problems:   Guaiac positive stools   Microcytic anemia   Arthritis   Hypertension   Hypothyroidism   Retinitis   GIB (gastrointestinal bleeding)   Abdominal distention   Essential hypertension   PAF (paroxysmal atrial fibrillation) (HCC)   Iron deficiency anemia due to chronic blood loss   Melena   Anemia, iron deficiency   Heme positive stool   Encounter for monitoring antiplatelet therapy   Colon cancer ([place]) - suspect transverse colon   Hx of CABG   Pre-operative cardiovascular examination   Acute on chronic diastolic (congestive) heart failure (HCC)   Leukocytosis   Thyroid activity decreased    Results:   Labs: Results for orders placed or performed during the hospital encounter of 12/07/14 (from the past 48 hour(s))  Basic metabolic panel     Status:  Abnormal   Collection Time: 12/20/14  5:10 AM  Result Value Ref Range   Sodium 137 135 - 145 mmol/L   Potassium 3.1 (L) 3.5 - 5.1 mmol/L    Comment: DELTA CHECK NOTED REPEATED TO VERIFY    Chloride 102 101 - 111 mmol/L   CO2 28 22 - 32 mmol/L   Glucose, Bld 97 65 - 99 mg/dL   BUN 27 (H) 6 - 20 mg/dL   Creatinine, Ser 1.16 0.61 - 1.24 mg/dL   Calcium 8.4 (L) 8.9 - 10.3 mg/dL   GFR calc non Af Amer 55 (L) >60 mL/min   GFR calc Af Amer >60 >60 mL/min    Comment: (NOTE) The eGFR has been calculated using the CKD EPI equation. This calculation has not been validated in all clinical situations. eGFR's persistently <60 mL/min signify possible Chronic Kidney Disease.    Anion gap 7 5 - 15  CBC     Status: Abnormal   Collection Time: 12/20/14  5:10 AM  Result Value Ref Range   WBC 6.2 4.0 - 10.5 K/uL   RBC 3.79 (L) 4.22 - 5.81 MIL/uL   Hemoglobin 9.3 (L) 13.0 - 17.0 g/dL   HCT 30.1 (L) 39.0 - 52.0 %   MCV 79.4 78.0 - 100.0 fL   MCH 24.5 (L) 26.0 -  34.0 pg   MCHC 30.9 30.0 - 36.0 g/dL   RDW 20.2 (H) 11.5 - 15.5 %   Platelets 369 150 - 400 K/uL  Magnesium     Status: None   Collection Time: 12/20/14  5:10 AM  Result Value Ref Range   Magnesium 1.9 1.7 - 2.4 mg/dL  Glucose, capillary     Status: None   Collection Time: 12/20/14  7:30 AM  Result Value Ref Range   Glucose-Capillary 95 65 - 99 mg/dL  Basic metabolic panel     Status: Abnormal   Collection Time: 12/21/14  5:00 AM  Result Value Ref Range   Sodium 136 135 - 145 mmol/L   Potassium 3.2 (L) 3.5 - 5.1 mmol/L   Chloride 104 101 - 111 mmol/L   CO2 26 22 - 32 mmol/L   Glucose, Bld 98 65 - 99 mg/dL   BUN 22 (H) 6 - 20 mg/dL   Creatinine, Ser 1.11 0.61 - 1.24 mg/dL   Calcium 8.4 (L) 8.9 - 10.3 mg/dL   GFR calc non Af Amer 58 (L) >60 mL/min   GFR calc Af Amer >60 >60 mL/min    Comment: (NOTE) The eGFR has been calculated using the CKD EPI equation. This calculation has not been validated in all clinical  situations. eGFR's persistently <60 mL/min signify possible Chronic Kidney Disease.    Anion gap 6 5 - 15  CBC     Status: Abnormal   Collection Time: 12/21/14  5:00 AM  Result Value Ref Range   WBC 8.2 4.0 - 10.5 K/uL   RBC 3.61 (L) 4.22 - 5.81 MIL/uL   Hemoglobin 8.8 (L) 13.0 - 17.0 g/dL   HCT 28.1 (L) 39.0 - 52.0 %   MCV 77.8 (L) 78.0 - 100.0 fL   MCH 24.4 (L) 26.0 - 34.0 pg   MCHC 31.3 30.0 - 36.0 g/dL   RDW 20.0 (H) 11.5 - 15.5 %   Platelets 336 150 - 400 K/uL    Imaging / Studies: No results found.  Medications / Allergies:  Scheduled Meds: . amiodarone  200 mg Oral Daily  . carvedilol  3.125 mg Oral BID WC  . enoxaparin (LOVENOX) injection  40 mg Subcutaneous Q24H  . erythromycin  1 application Right Eye QHS  . feeding supplement (ENSURE ENLIVE)  237 mL Oral BID BM  . furosemide  40 mg Oral Daily  . levothyroxine  88 mcg Oral QAC breakfast  . multivitamin with minerals  1 tablet Oral Daily  . mycophenolate  500 mg Oral Daily  . sodium chloride  3 mL Intravenous Q12H   Continuous Infusions:  PRN Meds:.acetaminophen, levalbuterol, metoprolol, morphine injection, ondansetron **OR** ondansetron (ZOFRAN) IV  Antibiotics: Anti-infectives    Start     Dose/Rate Route Frequency Ordered Stop   12/21/14 1000  levofloxacin (LEVAQUIN) tablet 750 mg  Status:  Discontinued     750 mg Oral Daily 12/20/14 1110 12/21/14 0917   12/18/14 0600  levofloxacin (LEVAQUIN) IVPB 750 mg  Status:  Discontinued     750 mg 100 mL/hr over 90 Minutes Intravenous Every 24 hours 12/18/14 0545 12/20/14 1110   12/15/14 1045  cefOXitin (MEFOXIN) 2 g in dextrose 5 % 50 mL IVPB     2 g 100 mL/hr over 30 Minutes Intravenous  Once 12/15/14 1014 12/15/14 1045       Assessment/Plan Adenocarcinoma of colon  POD#6 laparoscopic right colectomy--Dr. Marlou Starks  -stable, will advance his diet -leave staples in for additional  4-6 days -pathology-adenocarcinoma 2/40 positive lymph nodes. Seen by Dr.  Alen Blew this admission.  IDA-h&h stable CV-acute on chronic CHF, Afib, CAD s/p CABG. Cards following. Not a candidate for oral anticoagulants due to dementia/falls.  VTE prophylaxis-SCD/lovenox  GI-having diarrhea, c diff pending Hypothyroid BPH Dementia  FEN-soft diet. Hypokalemia-given 69mq, give additional 464m.  May continue to low due to GI losses. Dispo-surgically stable for SNF  [facility] Pager [phone](7A-4:30P)   12/21/2014 10:02 AM

## 2014-12-21 NOTE — Care Management Note (Signed)
Case Management Note  Patient Details  Name: Jesse Burton MRN: [id number] Date of Birth: Nov 03, 1925  Subjective/Objective:                    Action/Plan:d/c SNF.   Expected Discharge Date:                  Expected Discharge Plan:  Skilled Nursing Facility  In-House Referral:  Clinical Social Work  Discharge planning Services  CM Consult  Post Acute Care Choice:    Choice offered to:     DME Arranged:    DME Agency:     HH Arranged:    National Agency:     Status of Service:  Completed, signed off  Medicare Important Message Given:  Yes-third notification given Date Medicare IM Given:    Medicare IM give by:    Date Additional Medicare IM Given:    Additional Medicare Important Message give by:     If discussed at [place] of Stay Meetings, dates discussed:    Additional Comments:  Dessa Phi, RN 12/21/2014, 11:40 AM

## 2014-12-21 NOTE — Progress Notes (Signed)
Physical Therapy Treatment Patient Details Name: Jesse Burton MRN: [id number] DOB: [birth year] Today's Date: 12/21/2014    History of Present Illness      PT Comments    Pt. Presented in bed with improved cognition from Monday; transferred from bed to [facility] and then ambulated 100' x2 in hallway with RW and min/mod assist with VC and TC for upright posture and staying inside the RW/not pushing it away in front of him; ambulated with 1 posterior LOB upon standing, flexed trunk, head down and increased distance from walker and required one seated rest break at 100'; requires chair follow   Follow Up Recommendations  SNF     Equipment Recommendations  None recommended by PT    Recommendations for Other Services       Precautions / Restrictions Precautions Precautions: Fall Restrictions Weight Bearing Restrictions: No    Mobility  Bed Mobility Overal bed mobility: Needs Assistance Bed Mobility: Sidelying to Sit;Rolling Rolling: Min assist (hand placement to bed rails ) Sidelying to sit: Mod assist;Min assist          Transfers Overall transfer level: Needs assistance Equipment used: Rolling walker (2 wheeled) Transfers: Sit to/from Stand Sit to Stand: Min assist;Mod assist         General transfer comment: requires HHA and min/mod assist to initiate standing  Ambulation/Gait Ambulation/Gait assistance: Min assist;Mod assist Ambulation Distance (Feet): 200 Feet Assistive device: Rolling walker (2 wheeled) Gait Pattern/deviations: Step-to pattern;Decreased step length - right;Decreased step length - left;Decreased stride length;Trunk flexed     General Gait Details: pt. ambulated 200 ft in hallway with RW and min/mod assist with VC and TC for upright posture, inside RW, and to stop pushing RW away from him; needs follow with chair for safety    Stairs            Wheelchair Mobility    Modified Rankin (Stroke Patients Only)       Balance                                     Cognition Arousal/Alertness: Awake/alert Behavior During Therapy: WFL for tasks assessed/performed Overall Cognitive Status: History of cognitive impairments - at baseline                      Exercises      General Comments        Pertinent Vitals/Pain Pain Assessment: No/denies pain    Home Living                      Prior Function            PT Goals (current goals can now be found in the care plan section) Acute Rehab PT Goals Time For Goal Achievement: 12/22/14 Potential to Achieve Goals: Fair Progress towards PT goals: Progressing toward goals    Frequency  Min 3X/week    PT Plan Current plan remains appropriate    Co-evaluation             End of Session Equipment Utilized During Treatment: Gait belt;Oxygen Activity Tolerance: Patient tolerated treatment well Patient left: in chair;with call bell/phone within reach;with chair alarm set;with family/visitor present;with nursing/sitter in room     Time: 1035-1102 PT Time Calculation (min) (ACUTE ONLY): 27 min  Charges:  $Gait Training: 8-22 mins $Therapeutic Activity: 8-22 mins  G CodesDenna Haggard, SPTA   12/21/2014 12:51 PM   Pager: [phone]    Reviewed and agree with above Rica Koyanagi  PTA [facility]  Acute  Rehab Pager      [phone]

## 2014-12-27 LAB — BASIC METABOLIC PANEL
BUN: 15 mg/dL (ref 4–21)
CREATININE: 1.2 mg/dL (ref 0.6–1.3)
GLUCOSE: 93 mg/dL
POTASSIUM: 4.3 mmol/L (ref 3.4–5.3)
Sodium: 138 mmol/L (ref 137–147)

## 2014-12-27 LAB — CBC AND DIFFERENTIAL
HCT: 27 % — AB (ref 41–53)
Hemoglobin: 8.3 g/dL — AB (ref 13.5–17.5)
Platelets: 321 10*3/uL (ref 150–399)
WBC: 4.2 10*3/mL

## 2014-12-28 ENCOUNTER — Encounter: Payer: Self-pay | Admitting: Nurse Practitioner

## 2014-12-28 ENCOUNTER — Non-Acute Institutional Stay (SKILLED_NURSING_FACILITY): Payer: Medicare Other | Admitting: Nurse Practitioner

## 2014-12-28 DIAGNOSIS — N182 Chronic kidney disease, stage 2 (mild): Secondary | ICD-10-CM | POA: Diagnosis not present

## 2014-12-28 DIAGNOSIS — R413 Other amnesia: Secondary | ICD-10-CM | POA: Diagnosis not present

## 2014-12-28 DIAGNOSIS — D509 Iron deficiency anemia, unspecified: Secondary | ICD-10-CM | POA: Diagnosis not present

## 2014-12-28 DIAGNOSIS — E039 Hypothyroidism, unspecified: Secondary | ICD-10-CM | POA: Diagnosis not present

## 2014-12-28 DIAGNOSIS — I25700 Atherosclerosis of coronary artery bypass graft(s), unspecified, with unstable angina pectoris: Secondary | ICD-10-CM

## 2014-12-28 DIAGNOSIS — Z9049 Acquired absence of other specified parts of digestive tract: Secondary | ICD-10-CM | POA: Diagnosis not present

## 2014-12-28 DIAGNOSIS — I10 Essential (primary) hypertension: Secondary | ICD-10-CM | POA: Diagnosis not present

## 2014-12-28 DIAGNOSIS — Z8679 Personal history of other diseases of the circulatory system: Secondary | ICD-10-CM

## 2014-12-28 DIAGNOSIS — N183 Chronic kidney disease, stage 3 unspecified: Secondary | ICD-10-CM | POA: Insufficient documentation

## 2014-12-28 NOTE — Assessment & Plan Note (Signed)
Continue Levothyroxine 90mcg daily. Last TSH wnl in April 2016

## 2014-12-28 NOTE — Assessment & Plan Note (Signed)
12/06/14 Hgb 4.5, MCV76.8, MCH 22.2, RDW 16.2 12/27/14 Hgb 8.3, s/p transfusion, s/p colectomy for colon sarcoma.  12/28/14 added Iron 325mg  daily, CBC/BMP 01/03/15

## 2014-12-28 NOTE — Progress Notes (Signed)
Patient ID: Jesse Burton, male   DOB: 03-Mar-1925, 81 y.o.   MRN: [id number]  Location:  SNF [facility] Provider:  Marlana Latus NP  Code Status:  DNR Goals of care: Advanced Directive information    Chief Complaint  Patient presents with  . Medical Management of Chronic Issues  . Acute Visit    anemia, s/p partial colectomy     HPI: Patient is a 81 y.o. male seen in the SNF at [facility] today for evaluation of anemia, s/p partial colectomy.  CAD has been stable, no angina since last visit, hypothyroidism has been supplemented well with last TSH wnl 05/2014, HTN is controlled with Sbp in 150-160s, his memory has been gradual decline but functioning well in SNF, off Namenda, peripheral neuropathy has been stable, he walks with walker for short distance and w/c to go further.    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 The patient presented to ED with Hgb 4.5, had PRBC transfusion, a large transverse colon sarcoma identified. He is healing nicely, abd surgical wound staple removed 12/27/14. He is self propelling in w/c on unit for mobility.     Review of Systems  Constitutional: Negative for fever, chills and diaphoresis.  HENT: Positive for hearing loss. Negative for congestion and ear pain.   Eyes: Negative for pain, discharge and redness.  Respiratory: Negative for cough and shortness of breath.   Cardiovascular: Negative for chest pain, palpitations and leg swelling.  Gastrointestinal: Positive for constipation. Negative for nausea, vomiting, abdominal pain and diarrhea.  Genitourinary: Positive for frequency. Negative for dysuria and urgency.  Musculoskeletal: Positive for back pain. Negative for myalgias and neck pain.       W/c for mobility.   Skin: Negative for rash.       abd laparoscopic surgical scars.   Neurological: Negative for dizziness, tremors, seizures, weakness and headaches.       Peripheral neuropathy  Psychiatric/Behavioral: Positive  for memory loss. Negative for suicidal ideas and hallucinations. The patient is not nervous/anxious.        Mood has been stabilized.     Past Medical History  Diagnosis Date  . History of atrial fibrillation   . Thyroid disease   . Memory loss   . Stroke ([place])   . 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 Transient cerebral ischemia 07/04/2012    a posterior circulation TIA in October 2009.    Marland Kitchen HTN (hypertension) 12/01/2013  . CAD (coronary artery disease) of artery bypass graft 12/02/2013  . Hypothyroidism 12/01/2013  . Peripheral autonomic neuropathy in disorders classified elsewhere(337.1) 07/04/2012  . 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 Hereditary and idiopathic peripheral neuropathy 07/04/2012    a history of chronic sensory polyneuropathy of undetermined origin with abnormality of gait. He is walking with a rolling walker. Last MRA of the head January 2013 shows no significant stenosis, MRA of the neck shows mild stenosis of the proximal left internal carotid artery.    . Abnormality of gait 07/04/2012  . Dizziness and giddiness 07/04/2012  . Benign prostatic hyperplasia   . Edema   . Chorioretinitis   . Panuveitis   . Internal carotid artery stenosis     left  . Mild left ventricular hypertrophy   . Actinic keratoses   . Diverticulitis   . Prostatitis, acute     1991    Patient Active Problem List   Diagnosis Date Noted  . S/P partial colectomy 12/28/2014  . Chronic  kidney disease 12/28/2014  . Microcytic hypochromic anemia 12/07/2014  . Skin cancer 11/11/2014  . Constipation 10/10/2014  . Blepharitis of eyelid of right eye 02/24/2014  . Allergy 01/24/2014  . CAD (coronary artery disease) of artery bypass graft 12/02/2013  . Benign prostatic hyperplasia   . Edema   . Hypothyroidism 12/01/2013  . HTN (hypertension) 12/01/2013  . Memory loss 07/04/2012  . Peripheral autonomic neuropathy in disorders classified elsewhere 07/04/2012  .  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 Hereditary and idiopathic peripheral neuropathy 07/04/2012  . Abnormality of gait 07/04/2012  . Dizziness and giddiness 07/04/2012  . Transient cerebral ischemia 07/04/2012  . History of atrial fibrillation     Allergies  Allergen Reactions  . Sulfa Antibiotics     Medications: Patient's Medications  New Prescriptions   No medications on file  Previous Medications   ACETAMINOPHEN (TYLENOL) 325 MG TABLET    Take 650 mg by mouth every 6 (six) hours as needed. Take 2 tablets every day   CLOPIDOGREL (PLAVIX) 75 MG TABLET    Take 75 mg by mouth daily.   DIGOXIN (LANOXIN) 0.125 MG TABLET    Take 0.125 mg by mouth daily.   ERYTHROMYCIN OPHTHALMIC OINTMENT    Place 1 application into the right eye at bedtime.   FUROSEMIDE (LASIX) 20 MG TABLET    Take 20 mg by mouth daily. Taking 1 tablet every other day, and 2 pills = 40 mg on the other days   GLUCOSAMINE PO    Take by mouth. Taking 250 daily    L-METHYLFOLATE-B12-B6-B2 (CEREFOLIN PO)    Take by mouth daily.     LEVOTHYROXINE (SYNTHROID, LEVOTHROID) 88 MCG TABLET    Take 88 mcg by mouth daily before breakfast.   LOSARTAN (COZAAR) 50 MG TABLET    50 mg. Take one tablet daily   MEMANTINE (NAMENDA XR) 28 MG CP24 24 HR CAPSULE    Take 28 mg by mouth. Take one tablet daily for memory   MULTIPLE VITAMIN (MULTIVITAMIN) TABLET    Take 1 tablet by mouth daily.     MULTIVITAMIN (METANX) 3-35-2 MG TABS TABLET    Take 1 tablet by mouth daily.   POLYETHYLENE GLYCOL (MIRALAX / GLYCOLAX) PACKET    Take 17 g by mouth daily.   TRAMADOL (ULTRAM) 50 MG TABLET    Take 50 mg by mouth every 6 (six) hours as needed.  Modified Medications   No medications on file  Discontinued Medications   No medications on file    Physical Exam: Filed Vitals:   12/28/14 1146  BP: 118/78  Pulse: 72  Temp: 97.7 F (36.5 C)  TempSrc: Tympanic  Resp: 18   There is no  weight on file to calculate BMI.  Physical Exam  Constitutional: He appears well-developed and well-nourished. No distress.  HENT:  Head: Normocephalic and atraumatic.  Right Ear: External ear normal.  Left Ear: External ear normal.  Nose: Nose normal.  Mouth/Throat: Oropharynx is clear and moist. No oropharyngeal exudate.  Eyes: Conjunctivae and EOM are normal. Pupils are equal, round, and reactive to light. Right eye exhibits no discharge. Left eye exhibits no discharge. No scleral icterus.  Mild. Crusty matter right eyelashes.    Neck: Normal range of motion. Neck supple. No JVD present. No tracheal deviation present. No thyromegaly present.  Cardiovascular: Normal rate and regular rhythm.  Exam reveals no gallop and no friction rub.   Murmur heard. Systolic  murmur 2-3/6 right sternal border   Pulmonary/Chest: Effort normal and breath sounds normal. No stridor. No respiratory distress. He has no wheezes. He has no rales. He exhibits no tenderness.  Abdominal: Soft. Bowel sounds are normal. He exhibits no distension and no mass. There is no tenderness.  Musculoskeletal: Normal range of motion. He exhibits no edema.  Neurological: He is alert. He has normal reflexes. No cranial nerve deficit. He exhibits normal muscle tone. Coordination normal.  Skin: Skin is warm and dry. No rash noted. He is not diaphoretic. No erythema. No pallor.  Psychiatric: He has a normal mood and affect. Thought content normal. Cognition and memory are impaired. He exhibits abnormal recent memory. He exhibits normal remote memory.  Memory deficits.     Labs reviewed: Basic Metabolic Panel:  Recent Labs  05/27/14 12/06/14 12/27/14  NA 137 142 138  K 3.9 4.4 4.3  BUN 19 35* 15  CREATININE 0.9 1.1 1.2    Liver Function Tests:  Recent Labs  03/01/14 05/27/14 12/06/14  AST 42* 18 16  ALT 60* 15 14  ALKPHOS 70 60 66    CBC:  Recent Labs  05/27/14 12/06/14 12/27/14  WBC 4.9 5.1 4.2  HGB 10.7*  4.5* 8.3*  HCT 32* 16* 27*  PLT 240 241 321    Lab Results  Component Value Date   TSH 2.47 05/27/2014   No results found for: HGBA1C No results found for: CHOL, HDL, LDLCALC, LDLDIRECT, TRIG, CHOLHDL  Significant Diagnostic Results since last visit: none  Patient Care Team: Estill Dooms, MD as PCP - General (Internal Medicine) Zebedee Iba, MD as Referring Physician (Ophthalmology) Darlin Coco, MD as Consulting Physician (Cardiology) Garvin Fila, MD as Consulting Physician (Neurology) Festus Aloe, MD as Consulting Physician (Urology) Marilynne Halsted, MD as Referring Physician (Ophthalmology) Lavonna Monarch, MD as Consulting Physician (Dermatology) Gerarda Fraction, MD as Referring Physician (Ophthalmology)  Assessment/Plan Problem List Items Addressed This Visit    Memory loss (Chronic)    Off Namenda, SNF for care needs, baseline confusion.       Hypothyroidism (Chronic)    Continue Levothyroxine 90mcg daily. Last TSH wnl in April 2016      HTN (hypertension) (Chronic)    Continue Losartan 50mg  and Furosemide 40mg . Allow permissive blood pressure control.       CAD (coronary artery disease) of artery bypass graft (Chronic)    Off Plavix, risk for bleeding.       History of atrial fibrillation    Rate controlled, continue Amiodarone, off Digoxin.       Microcytic hypochromic anemia - Primary    12/06/14 Hgb 4.5, MCV76.8, MCH 22.2, RDW 16.2 12/27/14 Hgb 8.3, s/p transfusion, s/p colectomy for colon sarcoma.  12/28/14 added Iron 325mg  daily, CBC/BMP 01/03/15      S/P partial colectomy    12/27/14 s/p partial colectomy 12/15/14 by Dr. Marlou Starks, staple removed.        Chronic kidney disease    12/27/14 Bun 15, creat 1.21, continue Furosemide 40mg  daily, update BMP          Family/ staff Communication: SNF for care, fall risk  Labs/tests ordered:  CBC  BMP 01/03/15  [facility] Mast NP [facility] [place]  [facility] [address], [place] [address] On Call:  [phone] & follow prompts after 5pm & weekends Office Phone:  [phone] Office Fax:  [phone]

## 2014-12-28 NOTE — Assessment & Plan Note (Addendum)
12/27/14 Bun 15, creat 1.21, continue Furosemide 40mg  daily, update BMP

## 2014-12-28 NOTE — Assessment & Plan Note (Signed)
Off Namenda, SNF for care needs, baseline confusion.

## 2014-12-28 NOTE — Progress Notes (Signed)
This encounter was created in error - please disregard.

## 2014-12-28 NOTE — Assessment & Plan Note (Signed)
Continue Losartan 50mg  and Furosemide 40mg . Allow permissive blood pressure control.

## 2014-12-28 NOTE — Assessment & Plan Note (Signed)
Off Plavix, risk for bleeding.

## 2014-12-28 NOTE — Assessment & Plan Note (Signed)
12/27/14 s/p partial colectomy 12/15/14 by Dr. Marlou Starks, staple removed.

## 2014-12-28 NOTE — Assessment & Plan Note (Signed)
Rate controlled, continue Amiodarone, off Digoxin.

## 2014-12-29 ENCOUNTER — Telehealth: Payer: Self-pay | Admitting: Oncology

## 2014-12-29 NOTE — Telephone Encounter (Signed)
Hospital f/u appt date and time left mess

## 2014-12-30 ENCOUNTER — Telehealth: Payer: Self-pay | Admitting: Oncology

## 2014-12-30 NOTE — Telephone Encounter (Signed)
Lt msess regarding Hospital f/u date and time

## 2015-01-03 ENCOUNTER — Encounter: Payer: Self-pay | Admitting: Physician Assistant

## 2015-01-03 ENCOUNTER — Ambulatory Visit (INDEPENDENT_AMBULATORY_CARE_PROVIDER_SITE_OTHER): Payer: Medicare Other | Admitting: Physician Assistant

## 2015-01-03 VITALS — BP 100/50 | HR 61 | Ht 72.0 in | Wt 181.0 lb

## 2015-01-03 DIAGNOSIS — I10 Essential (primary) hypertension: Secondary | ICD-10-CM | POA: Diagnosis not present

## 2015-01-03 DIAGNOSIS — I5033 Acute on chronic diastolic (congestive) heart failure: Secondary | ICD-10-CM

## 2015-01-03 DIAGNOSIS — I480 Paroxysmal atrial fibrillation: Secondary | ICD-10-CM

## 2015-01-03 DIAGNOSIS — I5032 Chronic diastolic (congestive) heart failure: Secondary | ICD-10-CM | POA: Diagnosis not present

## 2015-01-03 DIAGNOSIS — D509 Iron deficiency anemia, unspecified: Secondary | ICD-10-CM

## 2015-01-03 DIAGNOSIS — E876 Hypokalemia: Secondary | ICD-10-CM

## 2015-01-03 DIAGNOSIS — I2510 Atherosclerotic heart disease of native coronary artery without angina pectoris: Secondary | ICD-10-CM | POA: Diagnosis not present

## 2015-01-03 LAB — BASIC METABOLIC PANEL
BUN: 14 mg/dL (ref 4–21)
CREATININE: 1 mg/dL (ref 0.6–1.3)
Glucose: 90 mg/dL
Potassium: 4 mmol/L (ref 3.4–5.3)
SODIUM: 136 mmol/L — AB (ref 137–147)

## 2015-01-03 LAB — CBC AND DIFFERENTIAL
HCT: 26 % — AB (ref 41–53)
Hemoglobin: 7.8 g/dL — AB (ref 13.5–17.5)
PLATELETS: 314 10*3/uL (ref 150–399)
WBC: 4.2 10^3/mL

## 2015-01-03 NOTE — Progress Notes (Signed)
Cardiology Office Note Date:  01/03/2015  Patient ID:  Jesse Burton, DOB 03/31/1925, MRN [id number] PCP:  No primary care provider on file.  Cardiologist:  Dr. Johnsie Cancel  Chief Complaint: f/u hospitalization for AF, CHF  History of Present Illness: Jesse Burton is a 81 y.o. male with history of CAD (s/p CABG 1991 without subsequent interventions), chronic diastolic CHF, arthritis, retinitis, HTN, HLD, hypothyroidism, BPH, dementia, recently diagnosed colon CA with iron deficiency anemia requiring PRBCs who presents for follow-up. He was admitted to [facility] 12/07/14 with profound iron deficiency anemia Hgb 4.5, melena, with new diagnosis of invasive adenocarcinoma of colon s/p lap R colectomy on 12/15/14. Per notes, oncology was consulted with recs of no role in systemic chemo or radiation therapy. During his admission he initially had NSR/PAF per notes but towards the end of his stay, he had persistent AF. Rates were controlled with carvedilol and amiodarone. Plavix was discontinued (on this since CABG) and he was not felt to be a candidate for anticoagulation for AF given anemia and fall risk.   He comes in today for follow-up accompanied by his daughter (whom he initially introduced as his wife) and a staff member from the nursing home [facility]. Per all their reports, he is doing much better. Stamina is increasing. He jokes that they are running him through more rigorous PT than he is used to - does this 5 days a week and has been enjoying it. No CP, palpitations, edema, weight gain. Appetite good. Has f/u coming up with surgery and oncology. He has since followed up with PCP at [facility] and Hgb was 8.3, K 4.3, Cr 1.2 (Cr similar to recent values in hospital).    Past Medical History  Diagnosis Date  . Arthritis   . Neuromuscular disorder ([place])   . Essential hypertension   . Hypothyroidism   . BPH (benign prostatic hyperplasia)   . Retinitis   . HLD (hyperlipidemia)   .  CAD (coronary artery disease) of bypass graft     a. s/p CABG 1991 without subsequent interventions.  . Chronic diastolic CHF (congestive heart failure) ([place])   . Hyperlipidemia   . Dementia   . Colon cancer ([place])     a. admitted to [facility] 12/07/14 with profound iron deficiency anemia Hgb 4.5, melena, with new diagnosis of invasive adenocarcinoma of colon s/p lap R colectomy on 12/15/14.  . Iron deficiency anemia     a. 11/2014 - Hgb 4.5 in setting of newly dx colon CA.   Marland Kitchen PAF (paroxysmal atrial fibrillation) ([place])     a. Not on anticoag due to bleeding and fall risk.    Past Surgical History  Procedure Laterality Date  . Coronary artery bypass graft  1991  . Colonoscopy with propofol N/A 12/12/2014    Procedure: COLONOSCOPY WITH PROPOFOL;  Surgeon: Gatha Mayer, MD;  Location: [facility] ENDOSCOPY;  Service: Endoscopy;  Laterality: N/A;  . Colon resection N/A 12/15/2014    Procedure: Laparoscopic partial colectomy;  Surgeon: Autumn Messing III, MD;  Location: [facility] ORS;  Service: General;  Laterality: N/A;    Current Outpatient Prescriptions  Medication Sig Dispense Refill  . acetaminophen (TYLENOL) 325 MG tablet Take 650 mg by mouth daily.    Marland Kitchen acetaminophen (TYLENOL) 325 MG tablet Take 650 mg by mouth every 6 (six) hours as needed (for mild pain.).    Marland Kitchen amiodarone (PACERONE) 200 MG tablet Take 1 tablet (200 mg total) by mouth daily. [place]  tablet 0  . atorvastatin (LIPITOR) 40 MG tablet Take 40 mg by mouth daily.    . carvedilol (COREG) 3.125 MG tablet Take 1 tablet (3.125 mg total) by mouth 2 (two) times daily with a meal. 60 tablet 0  . erythromycin ophthalmic ointment Place 1 application into the right eye at bedtime.    . feeding supplement, ENSURE ENLIVE, (ENSURE ENLIVE) LIQD Take 237 mLs by mouth 2 (two) times daily between meals. 237 mL 12  . ferrous sulfate 325 (65 FE) MG tablet Take 325 mg by mouth daily with breakfast.    . furosemide (LASIX) 40 MG tablet Take 1 tablet (40 mg total) by mouth  daily. 30 tablet 0  . Glucosamine-Chondroitin 750-600 MG TABS Take 2 tablets by mouth daily.    Marland Kitchen L-Methylfolate-B12-B6-B2 (CEREFOLIN) 07-19-48-5 MG TABS Take 1 tablet by mouth daily.    Marland Kitchen l-methylfolate-B6-B12 (METANX) 3-35-2 MG TABS tablet Take 1 tablet by mouth daily.    Marland Kitchen levalbuterol (XOPENEX) 0.63 MG/3ML nebulizer solution Take 3 mLs (0.63 mg total) by nebulization every 6 (six) hours as needed for wheezing or shortness of breath. 250 mL 0  . levothyroxine (SYNTHROID, LEVOTHROID) 88 MCG tablet Take 88 mcg by mouth daily before breakfast.    . Multiple Vitamin (MULTIVITAMIN WITH MINERALS) TABS tablet Take 1 tablet by mouth daily. CERTA-VITE SENIOR    . mycophenolate (CELLCEPT) 500 MG tablet Take 500 mg by mouth daily.    . polyethylene glycol (MIRALAX / GLYCOLAX) packet Take 17 g by mouth daily.    . potassium chloride SA (K-DUR,KLOR-CON) 20 MEQ tablet Take 1 tablet (20 mEq total) by mouth daily. 20 tablet 0  . tamsulosin (FLOMAX) 0.4 MG CAPS capsule Take 0.8 mg by mouth daily.     No current facility-administered medications for this visit.    Allergies:   Sulfa antibiotics and Ativan   Social History:  The patient  reports that he has quit smoking. He does not have any smokeless tobacco history on file. He reports that he does not drink alcohol.   Family History:  The patient's family history includes Stroke in his father. There is no history of Heart attack or Hypertension.  ROS:  Please see the history of present illness.  All other systems are reviewed and otherwise negative.   PHYSICAL EXAM:  VS:  BP 100/50 mmHg  Pulse 61  Ht 6' (1.829 m)  Wt 181 lb (82.101 kg)  BMI 24.54 kg/m2 BMI: Body mass index is 24.54 kg/(m^2). Well developed elderly WM in no acute distress HEENT: normocephalic, atraumatic Neck: no JVD, carotid bruits or masses Cardiac:  normal S1, S2; irregularly irregular; no murmurs, rubs, or gallops Lungs:  clear to auscultation bilaterally, no wheezing, rhonchi  or rales Abd: soft, nontender, no hepatomegaly, + BS MS: no deformity or atrophy Ext: no edema Skin: warm and dry, no rash Neuro:  moves all extremities spontaneously, follows commands, mild confusion noted but able to be reoriented - later remembered that the woman with him was his daughter, not his wife Psych: euthymic mood, jovial affect  EKG:  Done today shows atrial fib, 61bpm, nonspeciifc TWI III  Recent Labs: 12/07/2014: ALT 20; TSH 1.519 12/17/2014: B Natriuretic Peptide 877.1* 12/20/2014: Magnesium 1.9 12/21/2014: BUN 22*; Creatinine, Ser 1.11; Hemoglobin 8.8*; Platelets 336; Potassium 3.2*; Sodium 136  12/07/2014: Cholesterol 93; HDL 36*; LDL Cholesterol 47; Total CHOL/HDL Ratio 2.6; Triglycerides 51; VLDL 10   Estimated Creatinine Clearance: 51.5 mL/min (by C-G formula based on Cr of  1.11).   Wt Readings from Last 3 Encounters:  01/03/15 181 lb (82.101 kg)  12/21/14 188 lb 11.4 oz (85.6 kg)     Other studies reviewed: Additional studies/records reviewed today include: summarized above  ASSESSMENT AND PLAN:  1. CAD s/p CABG as above - no recent angina reported. Off antiplatelets due to recent severe anemia and bleeding. Continue beta blocker and statin therapy. 2. PAF, now persistent - continue rate control with amiodarone and carvedilol. He is asymptomatic. Not a candidate for anticoagulation due to recent anemia/bleeding issues. At this point we are using amiodarone more for rate control than rhythm control, given his tendency towards softer BP. Further monitoring of organ systems while on amiodarone will be at discretion of primary cardiologist. 3. Chronic diastolic CHF - appears euvolemic. The nursing facility should follow daily weights and call for weight gain of 3 lbs overnight or 5lbs over the course of several days. He should follow low sodium diet 2000mg  and fluid restrict to <2L per day total.  4. Hypokalemia - improved by recent check by PCP (4.3). 5. Iron  deficiency anemia in setting of recently diagnosed colon CA - followed by PCP, surgery, and oncology.  Disposition: F/u with Dr. Johnsie Cancel in 4 months. The nursing home [facility] has Tylenol listed twice - would advise they amend this with the correct revision. This office note was printed and given to the staff member to take back to the facility.  Current medicines are reviewed at length with the patient today.  The patient did not have any concerns regarding medicines.  Signed, Melina Copa PA-C 01/03/2015 1:55 PM     [address] [place] [address] [phone] (office)  [phone] (fax)

## 2015-01-03 NOTE — Patient Instructions (Signed)
Medication Instructions:  Your physician recommends that you continue on your current medications as directed. Please refer to the Current Medication list given to you today.   Labwork: None ordered  Testing/Procedures: None ordered  Follow-Up: Your physician wants you to follow-up in: [place]. Johnsie Cancel.    Any Other Special Instructions Will Be Listed Below (If Applicable).   If you need a refill on your cardiac medications before your next appointment, please call your pharmacy.

## 2015-01-04 ENCOUNTER — Other Ambulatory Visit: Payer: Self-pay | Admitting: Nurse Practitioner

## 2015-01-04 DIAGNOSIS — D509 Iron deficiency anemia, unspecified: Secondary | ICD-10-CM

## 2015-01-04 DIAGNOSIS — D500 Iron deficiency anemia secondary to blood loss (chronic): Secondary | ICD-10-CM

## 2015-01-04 DIAGNOSIS — E039 Hypothyroidism, unspecified: Secondary | ICD-10-CM

## 2015-01-04 DIAGNOSIS — I5033 Acute on chronic diastolic (congestive) heart failure: Secondary | ICD-10-CM

## 2015-01-04 DIAGNOSIS — D72829 Elevated white blood cell count, unspecified: Secondary | ICD-10-CM

## 2015-01-09 ENCOUNTER — Encounter: Payer: Self-pay | Admitting: Nurse Practitioner

## 2015-01-30 ENCOUNTER — Non-Acute Institutional Stay (SKILLED_NURSING_FACILITY): Payer: Medicare Other | Admitting: Nurse Practitioner

## 2015-01-30 ENCOUNTER — Encounter: Payer: Self-pay | Admitting: Nurse Practitioner

## 2015-01-30 DIAGNOSIS — E039 Hypothyroidism, unspecified: Secondary | ICD-10-CM | POA: Diagnosis not present

## 2015-01-30 DIAGNOSIS — N400 Enlarged prostate without lower urinary tract symptoms: Secondary | ICD-10-CM | POA: Diagnosis not present

## 2015-01-30 DIAGNOSIS — I25700 Atherosclerosis of coronary artery bypass graft(s), unspecified, with unstable angina pectoris: Secondary | ICD-10-CM

## 2015-01-30 DIAGNOSIS — G909 Disorder of the autonomic nervous system, unspecified: Secondary | ICD-10-CM | POA: Diagnosis not present

## 2015-01-30 DIAGNOSIS — D509 Iron deficiency anemia, unspecified: Secondary | ICD-10-CM

## 2015-01-30 DIAGNOSIS — R413 Other amnesia: Secondary | ICD-10-CM | POA: Diagnosis not present

## 2015-01-30 DIAGNOSIS — Z8679 Personal history of other diseases of the circulatory system: Secondary | ICD-10-CM | POA: Diagnosis not present

## 2015-01-30 DIAGNOSIS — I10 Essential (primary) hypertension: Secondary | ICD-10-CM

## 2015-01-30 DIAGNOSIS — G990 Autonomic neuropathy in diseases classified elsewhere: Secondary | ICD-10-CM

## 2015-01-30 DIAGNOSIS — N182 Chronic kidney disease, stage 2 (mild): Secondary | ICD-10-CM | POA: Diagnosis not present

## 2015-01-30 NOTE — Assessment & Plan Note (Signed)
Continue Losartan 50mg and Furosemide 40mg. Allow permissive blood pressure control.  

## 2015-01-30 NOTE — Assessment & Plan Note (Signed)
Not apparent, continue Furosemide 40mg  and 20mg  alternating dose, update BMP

## 2015-01-30 NOTE — Assessment & Plan Note (Signed)
Off Namenda, SNF for care needs, baseline confusion.  

## 2015-01-30 NOTE — Assessment & Plan Note (Signed)
12/06/14 Hgb 4.5, MCV76.8, MCH 22.2, RDW 16.2 12/27/14 Hgb 8.3, s/p transfusion, s/p colectomy for colon sarcoma.  12/28/14 added Iron 325mg  daily, CBC/BMP 01/03/15 01/30/15 update CBC

## 2015-01-30 NOTE — Assessment & Plan Note (Signed)
11/15/14 decrease Cellcept to 500mg daily, then dc in 3 months-toxicity.  

## 2015-01-30 NOTE — Progress Notes (Signed)
Patient ID: Jesse Burton, male   DOB: [birth year], 81 y.o.   MRN: [id number]  Location:  SNF [facility] Provider:  Marlana Latus NP  Code Status:  DNR Goals of care: Advanced Directive information    Chief Complaint  Patient presents with  . Medical Management of Chronic Issues     HPI: Patient is a 81 y.o. male seen in the SNF at [facility] today for evaluation of anemia, s/p partial colectomy.  CAD has been stable, no angina since last visit, hypothyroidism has been supplemented well with last TSH wnl 05/2014, HTN is controlled with Sbp in 150-160s, his memory has been gradual decline but functioning well in SNF, off Namenda, peripheral neuropathy has been stable, he walks with walker for short distance and w/c to go further.    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 The patient presented to ED with Hgb 4.5, had PRBC transfusion, a large transverse colon sarcoma identified. He is healing nicely, abd surgical wound staple removed 12/27/14. He is self propelling in w/c on unit for mobility.     Review of Systems  Constitutional: Negative for fever, chills and diaphoresis.  HENT: Positive for hearing loss. Negative for congestion and ear pain.   Eyes: Negative for pain, discharge and redness.  Respiratory: Negative for cough and shortness of breath.   Cardiovascular: Negative for chest pain, palpitations and leg swelling.  Gastrointestinal: Positive for constipation. Negative for nausea, vomiting, abdominal pain and diarrhea.  Genitourinary: Positive for frequency. Negative for dysuria and urgency.  Musculoskeletal: Positive for back pain. Negative for myalgias and neck pain.       W/c for mobility.   Skin: Negative for rash.       abd laparoscopic surgical scars.   Neurological: Negative for dizziness, tremors, seizures, weakness and headaches.       Peripheral neuropathy  Psychiatric/Behavioral: Positive for memory loss. Negative for suicidal ideas and  hallucinations. The patient is not nervous/anxious.        Mood has been stabilized.     Past Medical History  Diagnosis Date  . History of atrial fibrillation   . Thyroid disease   . Memory loss   . Stroke ([place])   . 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 Transient cerebral ischemia 07/04/2012    a posterior circulation TIA in October 2009.    Marland Kitchen HTN (hypertension) 12/01/2013  . CAD (coronary artery disease) of artery bypass graft 12/02/2013  . Hypothyroidism 12/01/2013  . Peripheral autonomic neuropathy in disorders classified elsewhere(337.1) 07/04/2012  . 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 Hereditary and idiopathic peripheral neuropathy 07/04/2012    a history of chronic sensory polyneuropathy of undetermined origin with abnormality of gait. He is walking with a rolling walker. Last MRA of the head January 2013 shows no significant stenosis, MRA of the neck shows mild stenosis of the proximal left internal carotid artery.    . Abnormality of gait 07/04/2012  . Dizziness and giddiness 07/04/2012  . Benign prostatic hyperplasia   . Edema   . Chorioretinitis   . Panuveitis   . Internal carotid artery stenosis     left  . Mild left ventricular hypertrophy   . Actinic keratoses   . Diverticulitis   . Prostatitis, acute     1991  . Arthritis   . Neuromuscular disorder ([place])   . Essential hypertension   . Hypothyroidism   . BPH (benign prostatic hyperplasia)   . Retinitis   .  HLD (hyperlipidemia)   . CAD (coronary artery disease) of bypass graft     a. s/p CABG 1991 without subsequent interventions.  . Chronic diastolic CHF (congestive heart failure) ([place])   . Hyperlipidemia   . Dementia   . Colon cancer ([place])     a. admitted to [facility] 12/07/14 with profound iron deficiency anemia Hgb 4.5, melena, with new diagnosis of invasive adenocarcinoma of colon s/p lap R colectomy on 12/15/14.  . Iron deficiency anemia     a. 11/2014 - Hgb 4.5 in setting of newly dx  colon CA.   Marland Kitchen PAF (paroxysmal atrial fibrillation) ([place])     a. Not on anticoag due to bleeding and fall risk.    Patient Active Problem List   Diagnosis Date Noted  . Chronic diastolic CHF (congestive heart failure) ([place])   . CAD (coronary artery disease) of bypass graft   . S/P partial colectomy 12/28/2014  . Chronic kidney disease 12/28/2014  . Thyroid activity decreased   . Leukocytosis   . Acute on chronic diastolic (congestive) heart failure ([place]) 12/17/2014  . Colonic mass 12/13/2014  . Hx of CABG 12/13/2014  . Pre-operative cardiovascular examination 12/13/2014  . Colon cancer ([place]) - suspect transverse colon 12/12/2014  . Anemia, iron deficiency   . Heme positive stool   . Encounter for monitoring antiplatelet therapy   . Microcytic hypochromic anemia 12/07/2014  . Guaiac positive stools 12/07/2014  . Microcytic anemia 12/07/2014  . GIB (gastrointestinal bleeding) 12/07/2014  . PAF (paroxysmal atrial fibrillation) ([place]) 12/07/2014  . Arthritis   . Hypertension   . Hypothyroidism   . Retinitis   . Abdominal distention   . Essential hypertension   . Iron deficiency anemia due to chronic blood loss   . Melena   . Skin cancer 11/11/2014  . Constipation 10/10/2014  . Blepharitis of eyelid of right eye 02/24/2014  . Allergy 01/24/2014  . CAD (coronary artery disease) of artery bypass graft 12/02/2013  . Benign prostatic hyperplasia   . Edema   . Hypothyroidism 12/01/2013  . HTN (hypertension) 12/01/2013  . Memory loss 07/04/2012  . Peripheral autonomic neuropathy in disorders classified elsewhere 07/04/2012  . 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 Hereditary and idiopathic peripheral neuropathy 07/04/2012  . Abnormality of gait 07/04/2012  . Dizziness and giddiness 07/04/2012  . Transient cerebral ischemia 07/04/2012  . History of atrial fibrillation     Allergies  Allergen Reactions  . Sulfa  Antibiotics   . Sulfa Antibiotics Other (See Comments)    Patient states he does not recall an allergy to sulfa  . Ativan [Lorazepam] Anxiety    Medications: Patient's Medications  New Prescriptions   No medications on file  Previous Medications   ACETAMINOPHEN (TYLENOL) 325 MG TABLET    Take 650 mg by mouth every 6 (six) hours as needed. Take 2 tablets every day   ACETAMINOPHEN (TYLENOL) 325 MG TABLET    Take 650 mg by mouth every 6 (six) hours as needed (for mild pain.).   AMIODARONE (PACERONE) 200 MG TABLET    Take 1 tablet (200 mg total) by mouth daily.   ATORVASTATIN (LIPITOR) 40 MG TABLET    Take 40 mg by mouth daily.   CARVEDILOL (COREG) 3.125 MG TABLET    Take 1 tablet (3.125 mg total) by mouth 2 (two) times daily with a meal.   CLOPIDOGREL (PLAVIX) 75 MG TABLET    Take 75 mg by mouth  daily.   DIGOXIN (LANOXIN) 0.125 MG TABLET    Take 0.125 mg by mouth daily.   ERYTHROMYCIN OPHTHALMIC OINTMENT    Place 1 application into the right eye at bedtime.   ERYTHROMYCIN OPHTHALMIC OINTMENT    Place 1 application into the right eye at bedtime.   FEEDING SUPPLEMENT, ENSURE ENLIVE, (ENSURE ENLIVE) LIQD    Take 237 mLs by mouth 2 (two) times daily between meals.   FERROUS SULFATE 325 (65 FE) MG TABLET    Take 325 mg by mouth daily with breakfast.   FUROSEMIDE (LASIX) 20 MG TABLET    Take 20 mg by mouth daily. Taking 1 tablet every other day, and 2 pills = 40 mg on the other days   FUROSEMIDE (LASIX) 40 MG TABLET    Take 1 tablet (40 mg total) by mouth daily.   GLUCOSAMINE PO    Take by mouth. Taking 250 daily    GLUCOSAMINE-CHONDROITIN 750-600 MG TABS    Take 2 tablets by mouth daily.   L-METHYLFOLATE-B12-B6-B2 (CEREFOLIN PO)    Take by mouth daily.     L-METHYLFOLATE-B12-B6-B2 (CEREFOLIN) 07-19-48-5 MG TABS    Take 1 tablet by mouth daily.   L-METHYLFOLATE-B6-B12 (METANX) 3-35-2 MG TABS TABLET    Take 1 tablet by mouth daily.   LEVALBUTEROL (XOPENEX) 0.63 MG/3ML NEBULIZER SOLUTION    Take 3  mLs (0.63 mg total) by nebulization every 6 (six) hours as needed for wheezing or shortness of breath.   LEVOTHYROXINE (SYNTHROID, LEVOTHROID) 88 MCG TABLET    Take 88 mcg by mouth daily before breakfast.   LEVOTHYROXINE (SYNTHROID, LEVOTHROID) 88 MCG TABLET    Take 88 mcg by mouth daily before breakfast.   LOSARTAN (COZAAR) 50 MG TABLET    50 mg. Take one tablet daily   MEMANTINE (NAMENDA XR) 28 MG CP24 24 HR CAPSULE    Take 28 mg by mouth. Take one tablet daily for memory   MULTIPLE VITAMIN (MULTIVITAMIN WITH MINERALS) TABS TABLET    Take 1 tablet by mouth daily. CERTA-VITE SENIOR   MULTIPLE VITAMIN (MULTIVITAMIN) TABLET    Take 1 tablet by mouth daily.     MULTIVITAMIN (METANX) 3-35-2 MG TABS TABLET    Take 1 tablet by mouth daily.   MYCOPHENOLATE (CELLCEPT) 500 MG TABLET    Take 500 mg by mouth daily.   POLYETHYLENE GLYCOL (MIRALAX / GLYCOLAX) PACKET    Take 17 g by mouth daily.   POLYETHYLENE GLYCOL (MIRALAX / GLYCOLAX) PACKET    Take 17 g by mouth daily.   POTASSIUM CHLORIDE SA (K-DUR,KLOR-CON) 20 MEQ TABLET    Take 1 tablet (20 mEq total) by mouth daily.   TAMSULOSIN (FLOMAX) 0.4 MG CAPS CAPSULE    Take 0.8 mg by mouth daily.   TRAMADOL (ULTRAM) 50 MG TABLET    Take 50 mg by mouth every 6 (six) hours as needed.  Modified Medications   No medications on file  Discontinued Medications   No medications on file    Physical Exam: Filed Vitals:   01/30/15 1142  BP: 127/76  Pulse: 83  Temp: 97.6 F (36.4 C)  TempSrc: Tympanic  Resp: 18   There is no weight on file to calculate BMI.  Physical Exam  Constitutional: He appears well-developed and well-nourished. No distress.  HENT:  Head: Normocephalic and atraumatic.  Right Ear: External ear normal.  Left Ear: External ear normal.  Nose: Nose normal.  Mouth/Throat: Oropharynx is clear and moist. No oropharyngeal exudate.  Eyes:  Conjunctivae and EOM are normal. Pupils are equal, round, and reactive to light. Right eye exhibits no  discharge. Left eye exhibits no discharge. No scleral icterus.  Mild. Crusty matter right eyelashes.    Neck: Normal range of motion. Neck supple. No JVD present. No tracheal deviation present. No thyromegaly present.  Cardiovascular: Normal rate and regular rhythm.  Exam reveals no gallop and no friction rub.   Murmur heard. Systolic murmur [id number] right sternal border   Pulmonary/Chest: Effort normal and breath sounds normal. No stridor. No respiratory distress. He has no wheezes. He has no rales. He exhibits no tenderness.  Abdominal: Soft. Bowel sounds are normal. He exhibits no distension and no mass. There is no tenderness.  Musculoskeletal: Normal range of motion. He exhibits no edema.  Neurological: He is alert. He has normal reflexes. No cranial nerve deficit. He exhibits normal muscle tone. Coordination normal.  Skin: Skin is warm and dry. No rash noted. He is not diaphoretic. No erythema. No pallor.  Psychiatric: He has a normal mood and affect. Thought content normal. Cognition and memory are impaired. He exhibits abnormal recent memory. He exhibits normal remote memory.  Memory deficits.     Labs reviewed: Basic Metabolic Panel:  Recent Labs  12/19/14 0539 12/20/14 0510 12/21/14 0500 12/27/14 01/03/15  NA 139 137 136 138 136*  K 3.8 3.1* 3.2* 4.3 4.0  CL 104 102 104  --   --   CO2 27 28 26   --   --   GLUCOSE 109* 97 98  --   --   BUN 21* 27* 22* 15 14  CREATININE 1.04 1.16 1.11 1.2 1.0  CALCIUM 8.9 8.4* 8.4*  --   --   MG  --  1.9  --   --   --     Liver Function Tests:  Recent Labs  05/27/14 12/06/14 12/07/14 0144  AST 18 16 21   ALT 15 14 20   ALKPHOS 60 66 79  BILITOT  --   --  0.9  PROT  --   --  5.8*  ALBUMIN  --   --  3.6    CBC:  Recent Labs  12/07/14 0144  12/19/14 0539 12/20/14 0510 12/21/14 0500 12/27/14 01/03/15  WBC 4.3  < > 7.5 6.2 8.2 4.2 4.2  NEUTROABS 2.7  --   --   --   --   --   --   HGB 4.9*  < > 10.0* 9.3* 8.8* 8.3* 7.8*  HCT  16.5*  < > 33.0* 30.1* 28.1* 27* 26*  MCV 77.5*  < > 80.3 79.4 77.8*  --   --   PLT 273  < > 342 369 336 321 314  < > = values in this interval not displayed.  Lab Results  Component Value Date   TSH 1.519 12/07/2014   Lab Results  Component Value Date   HGBA1C 5.9* 12/15/2014   Lab Results  Component Value Date   CHOL 93 12/07/2014   HDL 36* 12/07/2014   LDLCALC 47 12/07/2014   TRIG 51 12/07/2014   CHOLHDL 2.6 12/07/2014    Significant Diagnostic Results since last visit: none  Patient Care Team: Estill Dooms, MD as PCP - General (Internal Medicine) Zebedee Iba, MD as Referring Physician (Ophthalmology) Darlin Coco, MD as Consulting Physician (Cardiology) Garvin Fila, MD as Consulting Physician (Neurology) Festus Aloe, MD as Consulting Physician (Urology) Marilynne Halsted, MD as Referring Physician (Ophthalmology) Lavonna Monarch, MD as Consulting Physician (  Dermatology) Gerarda Fraction, MD as Referring Physician (Ophthalmology)  Assessment/Plan Problem List Items Addressed This Visit    Anemia, iron deficiency - Primary    12/06/14 Hgb 4.5, MCV76.8, MCH 22.2, RDW 16.2 12/27/14 Hgb 8.3, s/p transfusion, s/p colectomy for colon sarcoma.  12/28/14 added Iron 325mg  daily, CBC/BMP 01/03/15 01/30/15 update CBC      Benign prostatic hyperplasia    No urinary retention. cotninueTamsulosin 0.8 mg daily.        CAD (coronary artery disease) of artery bypass graft (Chronic)    Off Plavix, risk for bleeding.       Chronic kidney disease    12/27/14 Bun 15, creat 1.21, continue Furosemide 40mg  daily, update BMP and BNP      Essential hypertension    Continue Losartan 50mg  and Furosemide 40mg . Allow permissive blood pressure control.       History of atrial fibrillation    Heart rate is in control, continue Digoxin. Digoxin level 0.8 05/03/14      Hypothyroidism (Chronic)    Continue Levothyroxine 56mcg daily. Last TSH wnl in April 2016, update TSH       Memory loss (Chronic)    Off Namenda, SNF for care needs, baseline confusion.       Peripheral autonomic neuropathy in disorders classified elsewhere    11/15/14 decrease Cellcept to 500mg  daily, then dc in 3 months-toxicity.          Family/ staff Communication: SNF for care, fall risk  Labs/tests ordered: CBC, BMP, BNP, TSH  ManXie Elizjah Noblet NP [facility] [address], [place] [address] On Call:  [phone] & follow prompts after 5pm & weekends Office Phone:  [phone] Office Fax:  [phone]

## 2015-01-30 NOTE — Assessment & Plan Note (Signed)
No urinary retention. cotninueTamsulosin 0.8 mg daily.  

## 2015-01-30 NOTE — Assessment & Plan Note (Signed)
Heart rate is in control, continue Digoxin. Digoxin level 0.8 05/03/14 

## 2015-01-30 NOTE — Assessment & Plan Note (Signed)
12/27/14 Bun 15, creat 1.21, continue Furosemide 40mg  daily, update BMP and BNP

## 2015-01-30 NOTE — Assessment & Plan Note (Signed)
Off Plavix, risk for bleeding.  

## 2015-01-30 NOTE — Assessment & Plan Note (Signed)
Continue Levothyroxine 79mcg daily. Last TSH wnl in April 2016, update TSH

## 2015-01-31 DIAGNOSIS — I5021 Acute systolic (congestive) heart failure: Secondary | ICD-10-CM | POA: Diagnosis not present

## 2015-01-31 DIAGNOSIS — I10 Essential (primary) hypertension: Secondary | ICD-10-CM | POA: Diagnosis not present

## 2015-01-31 DIAGNOSIS — E039 Hypothyroidism, unspecified: Secondary | ICD-10-CM | POA: Diagnosis not present

## 2015-01-31 LAB — CBC AND DIFFERENTIAL
HEMATOCRIT: 33 % — AB (ref 41–53)
Hemoglobin: 10.2 g/dL — AB (ref 13.5–17.5)
PLATELETS: 257 10*3/uL (ref 150–399)
WBC: 4.4 10*3/mL

## 2015-01-31 LAB — BASIC METABOLIC PANEL
BUN: 20 mg/dL (ref 4–21)
CREATININE: 1.2 mg/dL (ref 0.6–1.3)
GLUCOSE: 104 mg/dL
POTASSIUM: 4.2 mmol/L (ref 3.4–5.3)
Sodium: 139 mmol/L (ref 137–147)

## 2015-01-31 LAB — TSH: TSH: 4.6 u[IU]/mL (ref 0.41–5.90)

## 2015-02-09 ENCOUNTER — Ambulatory Visit (HOSPITAL_BASED_OUTPATIENT_CLINIC_OR_DEPARTMENT_OTHER): Payer: Medicare Other | Admitting: Oncology

## 2015-02-09 ENCOUNTER — Ambulatory Visit: Payer: Medicare Other | Admitting: Oncology

## 2015-02-09 VITALS — BP 113/60 | HR 83 | Temp 97.7°F | Resp 18 | Ht 72.0 in | Wt 182.4 lb

## 2015-02-09 DIAGNOSIS — D509 Iron deficiency anemia, unspecified: Secondary | ICD-10-CM

## 2015-02-09 DIAGNOSIS — C184 Malignant neoplasm of transverse colon: Secondary | ICD-10-CM

## 2015-02-09 DIAGNOSIS — C772 Secondary and unspecified malignant neoplasm of intra-abdominal lymph nodes: Secondary | ICD-10-CM | POA: Diagnosis not present

## 2015-02-09 DIAGNOSIS — C189 Malignant neoplasm of colon, unspecified: Secondary | ICD-10-CM

## 2015-02-09 NOTE — Progress Notes (Signed)
Hematology and Oncology Follow Up Visit  Jesse Burton [id number] 06-Sep-1925 81 y.o. 02/09/2015 11:26 AM GREEN, Jesse Burton, MDNo ref. provider found   Principle Diagnosis: 81 year old gentleman diagnosed with colon cancer in October 2016. He presented with symptomatic anemia and a hemoglobin of 4.5. Colonoscopy revealed a colonic mass. The final pathology showed a T3 N1 disease.   Prior Therapy: He is status post laparoscopic right colectomy done on 12/15/2014. 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 The margins not involved with 2 out of 14 lymph nodes involved. The final pathological staging was T3 N1 disease.  Current therapy: Observation and surveillance.  Interim History: Jesse Burton presents today for a follow-up visit with his daughter. He is a gentleman I saw in consultation during his hospitalization in October 2016. He underwent surgical resection as mentioned and have recovered well. He did not have any postoperative complications. He was discharged to senior living facility where he currently resides. He is able to ambulate with the help of a walker without any falls or syncope. Has not had any abdominal pain, hematochezia or melena. His hemoglobin have recovered close to 11 at this time. He does have dementia and unable to provide a lot of details of his history.  He does not report any headaches, blurry vision, syncope or seizures. He does not report any fevers or chills or sweats. Does not report any cough, hemoptysis or wheezing. Does not report any nausea, vomiting or abdominal pain. Does not report any constipation or diarrhea. Does not report any frequency urgency or hesitancy. Remaining review of systems unremarkable.  Medications: I have reviewed the patient's current medications.  Current Outpatient Prescriptions  Medication Sig Dispense Refill  . acetaminophen (TYLENOL) 325 MG tablet Take 650 mg by mouth  every 6 (six) hours as needed. Take 2 tablets every day    . acetaminophen (TYLENOL) 325 MG tablet Take 650 mg by mouth every 6 (six) hours as needed (for mild pain.).    Marland Kitchen amiodarone (PACERONE) 200 MG tablet Take 1 tablet (200 mg total) by mouth daily. 30 tablet 0  . atorvastatin (LIPITOR) 40 MG tablet Take 40 mg by mouth daily.    . carvedilol (COREG) 3.125 MG tablet Take 1 tablet (3.125 mg total) by mouth 2 (two) times daily with a meal. 60 tablet 0  . clopidogrel (PLAVIX) 75 MG tablet Take 75 mg by mouth daily.    . digoxin (LANOXIN) 0.125 MG tablet Take 0.125 mg by mouth daily.    Marland Kitchen erythromycin ophthalmic ointment Place 1 application into the right eye at bedtime.    Marland Kitchen erythromycin ophthalmic ointment Place 1 application into the right eye at bedtime.    . feeding supplement, ENSURE ENLIVE, (ENSURE ENLIVE) LIQD Take 237 mLs by mouth 2 (two) times daily between meals. 237 mL 12  . ferrous sulfate 325 (65 FE) MG tablet Take 325 mg by mouth daily with breakfast.    . furosemide (LASIX) 20 MG tablet Take 20 mg by mouth daily. Taking 1 tablet every other day, and 2 pills = 40 mg on the other days    . furosemide (LASIX) 40 MG tablet Take 1 tablet (40 mg total) by mouth daily. 30 tablet 0  . GLUCOSAMINE PO Take by mouth. Taking 250 daily     . Glucosamine-Chondroitin 750-600 MG TABS Take 2 tablets by mouth daily.    Marland Kitchen L-Methylfolate-B12-B6-B2 (CEREFOLIN PO) Take by mouth daily.      Marland Kitchen  L-Methylfolate-B12-B6-B2 (CEREFOLIN) 07-19-48-5 MG TABS Take 1 tablet by mouth daily.    Marland Kitchen l-methylfolate-B6-B12 (METANX) 3-35-2 MG TABS tablet Take 1 tablet by mouth daily.    Marland Kitchen levalbuterol (XOPENEX) 0.63 MG/3ML nebulizer solution Take 3 mLs (0.63 mg total) by nebulization every 6 (six) hours as needed for wheezing or shortness of breath. 250 mL 0  . levothyroxine (SYNTHROID, LEVOTHROID) 88 MCG tablet Take 88 mcg by mouth daily before breakfast.    . levothyroxine (SYNTHROID, LEVOTHROID) 88 MCG tablet Take 88 mcg by  mouth daily before breakfast.    . losartan (COZAAR) 50 MG tablet 50 mg. Take one tablet daily    . memantine (NAMENDA XR) 28 MG CP24 24 hr capsule Take 28 mg by mouth. Take one tablet daily for memory    . Multiple Vitamin (MULTIVITAMIN WITH MINERALS) TABS tablet Take 1 tablet by mouth daily. CERTA-VITE SENIOR    . Multiple Vitamin (MULTIVITAMIN) tablet Take 1 tablet by mouth daily.      . multivitamin (METANX) 3-35-2 MG TABS tablet Take 1 tablet by mouth daily.    . mycophenolate (CELLCEPT) 500 MG tablet Take 500 mg by mouth daily.    . polyethylene glycol (MIRALAX / GLYCOLAX) packet Take 17 g by mouth daily.    . polyethylene glycol (MIRALAX / GLYCOLAX) packet Take 17 g by mouth daily.    . potassium chloride SA (K-DUR,KLOR-CON) 20 MEQ tablet Take 1 tablet (20 mEq total) by mouth daily. 20 tablet 0  . tamsulosin (FLOMAX) 0.4 MG CAPS capsule Take 0.8 mg by mouth daily.    . traMADol (ULTRAM) 50 MG tablet Take 50 mg by mouth every 6 (six) hours as needed.     No current facility-administered medications for this visit.     Allergies:  Allergies  Allergen Reactions  . Sulfa Antibiotics   . Sulfa Antibiotics Other (See Comments)    Patient states he does not recall an allergy to sulfa  . Ativan [Lorazepam] Anxiety    Past Medical History, Surgical history, Social history, and Family History were reviewed and updated.  Physical Exam: Blood pressure 113/60, pulse 83, temperature 97.7 F (36.5 C), temperature source Oral, resp. rate 18, height 6' (1.829 m), weight 182 lb 6.4 oz (82.736 kg), SpO2 99 %. ECOG: 3 General appearance: alert and cooperative Head: Normocephalic, without obvious abnormality Neck: no adenopathy Lymph nodes: Cervical, supraclavicular, and axillary nodes normal. Heart:regular rate and rhythm, S1, S2 normal, no murmur, click, rub or gallop Lung:chest clear, no wheezing, rales, normal symmetric air entry Abdomin: soft, non-tender, without masses or  organomegaly EXT:no erythema, induration, or nodules   Lab Results: Lab Results  Component Value Date   WBC 4.4 01/31/2015   HGB 10.2* 01/31/2015   HCT 33* 01/31/2015   MCV 77.8* 12/21/2014   PLT 257 01/31/2015     Chemistry      Component Value Date/Time   NA 139 01/31/2015   NA 136 12/21/2014 0500   K 4.2 01/31/2015   CL 104 12/21/2014 0500   CO2 26 12/21/2014 0500   BUN 20 01/31/2015   BUN 22* 12/21/2014 0500   CREATININE 1.2 01/31/2015   CREATININE 1.11 12/21/2014 0500   GLU 104 01/31/2015      Component Value Date/Time   CALCIUM 8.4* 12/21/2014 0500   ALKPHOS 79 12/07/2014 0144   AST 21 12/07/2014 0144   ALT 20 12/07/2014 0144   BILITOT 0.9 12/07/2014 0144       Impression and Plan:  81 year old gentleman with the following issues:  1. Colon cancer diagnosed in October 2016 presented with symptomatic anemia and a large nonobstructing mass at the transverse colon. He is status post laparoscopic right colectomy with pathology revealing T3 N1 disease with 2 out of 14 lymph nodes involved. If*the procedure well and have recovered fully at this time.  The natural course of this disease was discussed with the patient and his daughter. Although he has stage III disease with 2 lymph node involvements he is not a candidate for adjuvant therapy. Any therapy or intervention would be aimed to address any issues that affects his quality of life. Chemotherapy will hinder his quality of life and will have very little impact on cancer relapse at this time.  I have not recommended any imaging studies moving forward unless he develops any symptoms. He develops symptoms of fatigue, abdominal pain or weight loss then a repeat imaging studies are recommended. I feel that any surveillance imaging studies or laboratory testing will have very little value unless he is symptomatic.  2. Iron deficiency anemia: His hemoglobin appears to be improving dramatically his last hemoglobin is  10.2. I have recommended iron supplement to continue for the time being.   3. Follow up: I will be happy to see him in the future as needed.    [facility], MD 12/22/201611:26 AM

## 2015-02-09 NOTE — Addendum Note (Signed)
Addended by: Randolm Idol on: 02/09/2015 12:10 PM   Modules accepted: Orders, Medications

## 2015-02-10 ENCOUNTER — Non-Acute Institutional Stay (SKILLED_NURSING_FACILITY): Payer: Medicare Other | Admitting: Internal Medicine

## 2015-02-10 ENCOUNTER — Encounter: Payer: Self-pay | Admitting: Internal Medicine

## 2015-02-10 DIAGNOSIS — I10 Essential (primary) hypertension: Secondary | ICD-10-CM | POA: Diagnosis not present

## 2015-02-10 DIAGNOSIS — G301 Alzheimer's disease with late onset: Secondary | ICD-10-CM | POA: Diagnosis not present

## 2015-02-10 DIAGNOSIS — F0280 Dementia in other diseases classified elsewhere without behavioral disturbance: Secondary | ICD-10-CM

## 2015-02-10 DIAGNOSIS — E039 Hypothyroidism, unspecified: Secondary | ICD-10-CM

## 2015-02-10 DIAGNOSIS — D509 Iron deficiency anemia, unspecified: Secondary | ICD-10-CM

## 2015-02-10 DIAGNOSIS — C184 Malignant neoplasm of transverse colon: Secondary | ICD-10-CM

## 2015-02-10 DIAGNOSIS — N182 Chronic kidney disease, stage 2 (mild): Secondary | ICD-10-CM | POA: Diagnosis not present

## 2015-02-10 DIAGNOSIS — I5032 Chronic diastolic (congestive) heart failure: Secondary | ICD-10-CM | POA: Diagnosis not present

## 2015-02-10 NOTE — Progress Notes (Signed)
Patient ID: Jesse Burton, male   DOB: 08-10-1925, 81 y.o.   MRN: [id number]    HISTORY AND PHYSICAL  Location:  [facility] Room Number: 60 Place of Service: SNF (31)   Extended Emergency Contact Information Primary Emergency Contact: Farr,Laura Address: [address] [place] [address] [place] Phone: [phone] Mobile Phone: [phone] Relation: Daughter Secondary Emergency Contact: Genia Plants [place] Phone: [phone] Relation: Daughter  Advanced Directive information Does patient have an advance directive?: No, Would patient like information on creating an advanced directive?: No - patient declined information  Chief Complaint  Patient presents with  . Readmit To SNF    Following hospitalization    HPI:  Hospitalized 12/07/2014 through 12/21/14. Patient presented with severe anemia with hemoglobin 4.5.. Workup showed a: Mass of the transverse colon. He underwent laparoscopic right colectomy 12/15/2014. Final pathology showed a T3 N1 disease. 2 out of 14 lymph nodes were involved with cancer. He was transfused during the course of the hospitalization to stabilize his anemia.  Patient has seen Dr. Zola Button, oncologist, on 02/09/2015. He does not believe patient is a candidate for adjuvant therapy. Chemotherapy would hinder his quality of life. He did not recommend any imaging studies moving forward unless he develops symptoms.  Anemia remains stable to improving with the last hemoglobin 10.2 g percent. He is to remain on iron supplement for the time being.  While hospitalized, he showed dementia with sundowning.  While hospitalized, he showed acute on chronic diastolic congestive heart failure with hypoxia. He was diuresed for this.  Past Medical History  Diagnosis Date  . History of atrial fibrillation   . Thyroid disease   . Memory loss   . Stroke ([place])   . 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 Transient cerebral ischemia 07/04/2012    a posterior circulation TIA in October 2009.    Marland Kitchen HTN (hypertension) 12/01/2013  . CAD (coronary artery disease) of artery bypass graft 12/02/2013  . Hypothyroidism 12/01/2013  . Peripheral autonomic neuropathy in disorders classified elsewhere(337.1) 07/04/2012  . 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 Hereditary and idiopathic peripheral neuropathy 07/04/2012    a history of chronic sensory polyneuropathy of undetermined origin with abnormality of gait. He is walking with a rolling walker. Last MRA of the head January 2013 shows no significant stenosis, MRA of the neck shows mild stenosis of the proximal left internal carotid artery.    . Abnormality of gait 07/04/2012  . Dizziness and giddiness 07/04/2012  . Benign prostatic hyperplasia   . Edema   . Chorioretinitis   . Panuveitis   . Internal carotid artery stenosis     left  . Mild left ventricular hypertrophy   . Actinic keratoses   . Diverticulitis   . Prostatitis, acute     1991  . Arthritis   . Neuromuscular disorder ([place])   . Essential hypertension   . Hypothyroidism   . BPH (benign prostatic hyperplasia)   . Retinitis   . HLD (hyperlipidemia)   . CAD (coronary artery disease) of bypass graft     a. s/p CABG 1991 without subsequent interventions.  . Chronic diastolic CHF (congestive heart failure) ([place])   . Hyperlipidemia   . Dementia   . Colon cancer ([place])     a. admitted to [facility]/07/14 with profound iron deficiency anemia Hgb 4.5, melena, with new diagnosis  of invasive adenocarcinoma of colon s/p lap R colectomy on 12/15/14.  . Iron deficiency anemia     a. 11/2014 - Hgb 4.5 in setting of newly dx colon CA.   Marland Kitchen PAF (paroxysmal atrial fibrillation) ([place])     a. Not on anticoag due to bleeding and fall risk.    Past Surgical History  Procedure Laterality Date  . Cardiac catheterization    . Coronary artery bypass graft  1991     grafts to LAD and RCA  . Appendectomy    . Coronary artery bypass graft  1991  . Colonoscopy with propofol N/A 12/12/2014    Procedure: COLONOSCOPY WITH PROPOFOL;  Surgeon: Gatha Mayer, MD;  Location: [facility] ENDOSCOPY;  Service: Endoscopy;  Laterality: N/A;  . Colon resection N/A 12/15/2014    Procedure: Laparoscopic partial colectomy;  Surgeon: Autumn Messing III, MD;  Location: [facility] ORS;  Service: General;  Laterality: N/A;    Patient Care Team: Estill Dooms, MD as PCP - General (Internal Medicine) Zebedee Iba, MD as Referring Physician (Ophthalmology) Darlin Coco, MD as Consulting Physician (Cardiology) Garvin Fila, MD as Consulting Physician (Neurology) Festus Aloe, MD as Consulting Physician (Urology) Marilynne Halsted, MD as Referring Physician (Ophthalmology) Lavonna Monarch, MD as Consulting Physician (Dermatology) Gerarda Fraction, MD as Referring Physician (Ophthalmology)  Social History   Social History  . Marital Status: Widowed    Spouse Name: N/A  . Number of Children: 3  . Years of Education: Bachelor's   Occupational History  .     Social History Main Topics  . Smoking status: Former Smoker    Quit date: 12/03/1978  . Smokeless tobacco: Not on file  . Alcohol Use: No  . Drug Use: No  . Sexual Activity: Not on file   Other Topics Concern  . Not on file   Social History Narrative   ** Merged History Encounter **       Patient is retired from job of 38 years. Patient lives a lone. Pt is recently widowed after 54 years of marriage. Patient has 3 children. Patient drinks 2 cups caffeine a week, quit tobacco use in 1980 and has one drink per month.  Lives at [facility] since 11/26/2013 skill unit [employer] [place]    reports that he quit smoking about 36 years ago. He does not have any smokeless tobacco history on file. He reports that he does not drink alcohol or use illicit drugs.  Family History  Problem Relation Age of Onset  . Congestive  Heart Failure Father   . Hypertension Father   . Coronary artery disease Father   . Heart attack Father   . Diabetes Father   . Heart failure Father   . Pneumonia Mother   . Diabetes Mother   . Cancer Mother     colon  . Hypertension Mother   . Diabetes Sister   . Cancer Sister     breast  . Coronary artery disease Brother   . Heart disease Sister   . Diabetes Sister   . Kidney failure Sister   . Heart attack Sister   . Stroke Father   . Heart attack Neg Hx   . Hypertension Neg Hx    Family Status  Relation Status Death Age  . Daughter Alive   . Son Alive   . Daughter Alive   . Mother Deceased 75    alzheimer's  . Father Deceased 74    diabetes  Immunization History  Administered Date(s) Administered  . Influenza-Unspecified 12/22/2013, 11/09/2014  . Pneumococcal Polysaccharide-23 12/02/1993  . Tdap 01/08/2013  . Zoster 12/03/2007    Allergies  Allergen Reactions  . Sulfa Antibiotics   . Sulfa Antibiotics Other (See Comments)    Patient states he does not recall an allergy to sulfa  . Ativan [Lorazepam] Anxiety    Medications: Patient's Medications  New Prescriptions   No medications on file  Previous Medications   ACETAMINOPHEN (TYLENOL) 325 MG TABLET    Take 650 mg by mouth every 6 (six) hours as needed (for mild pain.).   AMIODARONE (PACERONE) 200 MG TABLET    Take 1 tablet (200 mg total) by mouth daily.   ATORVASTATIN (LIPITOR) 40 MG TABLET    Take 40 mg by mouth daily.   CARVEDILOL (COREG) 3.125 MG TABLET    Take 1 tablet (3.125 mg total) by mouth 2 (two) times daily with a meal.   ERYTHROMYCIN OPHTHALMIC OINTMENT    Place 1 application into the right eye at bedtime.   FEEDING SUPPLEMENT, ENSURE ENLIVE, (ENSURE ENLIVE) LIQD    Take 237 mLs by mouth 2 (two) times daily between meals.   FERROUS SULFATE 325 (65 FE) MG TABLET    Take 325 mg by mouth daily with breakfast.   FUROSEMIDE (LASIX) 20 MG TABLET    Take 20 mg by mouth daily. Taking 1 tablet  every other day, and 2 pills = 40 mg on the other days   GLUCOSAMINE-CHONDROITIN 750-600 MG TABS    Take 2 tablets by mouth daily.   L-METHYLFOLATE-B12-B6-B2 (CEREFOLIN PO)    Take by mouth daily.     L-METHYLFOLATE-B6-B12 (METANX) 3-35-2 MG TABS TABLET    Take 1 tablet by mouth daily.   LEVALBUTEROL (XOPENEX) 0.63 MG/3ML NEBULIZER SOLUTION    Take 3 mLs (0.63 mg total) by nebulization every 6 (six) hours as needed for wheezing or shortness of breath.   LEVOTHYROXINE (SYNTHROID, LEVOTHROID) 88 MCG TABLET    Take 88 mcg by mouth daily before breakfast.   MULTIPLE VITAMIN (MULTIVITAMIN WITH MINERALS) TABS TABLET    Take 1 tablet by mouth daily. CERTA-VITE SENIOR   MULTIVITAMIN (METANX) 3-35-2 MG TABS TABLET    Take 1 tablet by mouth daily.   MYCOPHENOLATE (CELLCEPT) 500 MG TABLET    Take 500 mg by mouth daily.   POLYETHYLENE GLYCOL (MIRALAX / GLYCOLAX) PACKET    Take 17 g by mouth daily.   POTASSIUM CHLORIDE SA (K-DUR,KLOR-CON) 20 MEQ TABLET    Take 1 tablet (20 mEq total) by mouth daily.   TAMSULOSIN (FLOMAX) 0.4 MG CAPS CAPSULE    Take 0.8 mg by mouth daily.  Modified Medications   No medications on file  Discontinued Medications   No medications on file    Review of Systems  Constitutional: Negative for fever, chills and diaphoresis.  HENT: Positive for hearing loss. Negative for congestion, ear discharge, ear pain, nosebleeds, sore throat and tinnitus.   Eyes: Negative for photophobia, pain, discharge and redness.  Respiratory: Negative for cough, shortness of breath, wheezing and stridor.   Cardiovascular: Positive for leg swelling. Negative for chest pain and palpitations.       Trace only.   Gastrointestinal: Negative for nausea, vomiting, abdominal pain, diarrhea, constipation and blood in stool.  Endocrine: Negative for polydipsia.  Genitourinary: Positive for frequency. Negative for dysuria, urgency, hematuria and flank pain.  Musculoskeletal: Positive for back pain. Negative for  myalgias and neck pain.  Ambulates with walker for short distance and w/c to go further. Complains of right leg discomfort.  Skin: Negative for rash.       Lesion of the right lower leg.has resolved  Allergic/Immunologic: Negative for environmental allergies.  Neurological: Negative for dizziness, tremors, seizures, weakness and headaches.       Dementia  Hematological: Negative.  Does not bruise/bleed easily.  Psychiatric/Behavioral: Positive for confusion. Negative for suicidal ideas and hallucinations. The patient is nervous/anxious.     Filed Vitals:   02/10/15 1551  BP: 120/70  Pulse: 74  Temp: 97.5 F (36.4 C)  Resp: 18  Height: 6' (1.829 m)  Weight: 181 lb 4.8 oz (82.237 kg)   Body mass index is 24.58 kg/(m^2).  Physical Exam  Constitutional: He appears well-developed and well-nourished. No distress.  HENT:  Head: Normocephalic and atraumatic.  Right Ear: External ear normal.  Left Ear: External ear normal.  Nose: Nose normal.  Mouth/Throat: Oropharynx is clear and moist. No oropharyngeal exudate.  Eyes: Conjunctivae and EOM are normal. Pupils are equal, round, and reactive to light. Right eye exhibits no discharge. Left eye exhibits no discharge. No scleral icterus.  Neck: Normal range of motion. Neck supple. No JVD present. No tracheal deviation present. No thyromegaly present.  Cardiovascular: Normal rate and regular rhythm.  Exam reveals no gallop and no friction rub.   Murmur heard. Systolic murmur 7-1/6 right sternal border   Pulmonary/Chest: Effort normal and breath sounds normal. No stridor. No respiratory distress. He has no wheezes. He has no rales. He exhibits no tenderness.  Abdominal: Soft. Bowel sounds are normal. He exhibits no distension and no mass. There is no tenderness. There is no rebound and no guarding.  Musculoskeletal: Normal range of motion. He exhibits no edema or tenderness.  Lymphadenopathy:    He has no cervical adenopathy.    Neurological: He is alert. He has normal reflexes. No cranial nerve deficit. He exhibits normal muscle tone. Coordination normal.  Skin: Skin is warm and dry. No rash noted. He is not diaphoretic. No erythema. No pallor.  Psychiatric: He has a normal mood and affect. His behavior is normal. Judgment and thought content normal. Cognition and memory are impaired. He exhibits abnormal recent memory. He exhibits normal remote memory.    Labs reviewed: Lab Summary Latest Ref Rng 01/31/2015 01/03/2015 12/27/2014 12/21/2014 12/20/2014 12/19/2014 12/18/2014  Hemoglobin 13.5 - 17.5 g/dL 10.2(A) 7.8(A) 8.3(A) 8.8(L) 9.3(L) 10.0(L) 9.2(L)  Hematocrit 41 - 53 % 33(A) 26(A) 27(A) 28.1(L) 30.1(L) 33.0(L) 30.1(L)  White count - 4.4 4.2 4.2 8.2 6.2 7.5 8.5  Platelet count 150 - 399 K/L 257 314 321 336 369 342 332  Sodium 137 - 147 mmol/L 139 136(A) 138 136 137 139 141  Potassium 3.4 - 5.3 mmol/L 4.2 4.0 4.3 3.2(L) 3.1(L) 3.8 3.5  Calcium 8.9 - 10.3 mg/dL (None) (None) (None) 8.4(L) 8.4(L) 8.9 8.9  Phosphorus - (None) (None) (None) (None) (None) (None) (None)  Creatinine 0.6 - 1.3 mg/dL 1.2 1.0 1.2 1.11 1.16 1.04 0.96  AST - (None) (None) (None) (None) (None) (None) (None)  Alk Phos - (None) (None) (None) (None) (None) (None) (None)  Bilirubin - (None) (None) (None) (None) (None) (None) (None)  Glucose - 104 90 93 98 97 109(H) 114(H)  Cholesterol - (None) (None) (None) (None) (None) (None) (None)  HDL cholesterol - (None) (None) (None) (None) (None) (None) (None)  Triglycerides - (None) (None) (None) (None) (None) (None) (None)  LDL Direct - (None) (None) (None) (None) (None) (  None) (None)  LDL Calc - (None) (None) (None) (None) (None) (None) (None)  Total protein - (None) (None) (None) (None) (None) (None) (None)  Albumin - (None) (None) (None) (None) (None) (None) (None)   Lab Results  Component Value Date   BUN 20 01/31/2015   Lab Results  Component Value Date   HGBA1C 5.9* 12/15/2014   Lab  Results  Component Value Date   TSH 4.60 01/31/2015    Ct Abdomen Pelvis W Contrast  12/07/2014  CLINICAL DATA:  Dark stools and abdominal distention. EXAM: CT ABDOMEN AND PELVIS WITH CONTRAST TECHNIQUE: Multidetector CT imaging of the abdomen and pelvis was performed using the standard protocol following bolus administration of intravenous contrast. CONTRAST:  11m OMNIPAQUE IOHEXOL 300 MG/ML  SOLN COMPARISON:  None. FINDINGS: Normal hepatic contour. The examination is minimally degraded secondary exclusion of the dome of the diaphragm. No discrete hepatic lesions. There are multiple layering laminated gallstones seen throughout the gallbladder. This finding is associated with a potential very minimal amount of gallbladder wall thickening and pericholecystic fluid though this may be accentuated due to a minimal amount of third spacing within the abdomen. No definite intra or extrahepatic bili duct dilatation. No ascites. There is symmetric enhancement and excretion of the bilateral kidneys. No definite renal stones in this postcontrast examination. No discrete renal lesions. There is a minimal amount of bilateral perinephric stranding, likely age and body habitus related. No urinary obstruction. Normal appearance of the bilateral adrenal glands, pancreas and spleen. Ingested enteric contrast extends to the level of the distal transverse colon. Moderate colonic stool burden without evidence of enteric obstruction. The bowel is otherwise normal in course and caliber without wall thickening. Normal appearance of the terminal ileum. There is is a minimal amount of stranding throughout the root of the abdominal mesenteric including the right lower abdominal quadrant surrounding the appendix without focality and favored to be secondary to third spacing/volume overload. Otherwise, normal appearance of the appendix. Moderate amount of mixed calcified and noncalcified atherosclerotic plaque within a normal caliber  abdominal aorta. The major branch vessels of the abdominal aorta appear patent on this non CTA examination. 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 No bulky retroperitoneal, mesenteric, pelvic or inguinal lymphadenopathy. The prostate is borderline enlarged with mass effect on the undersurface urinary bladder. Otherwise normal appearance of the urinary bladder given degree distention. There is a small amount of fluid seen within the pelvic cul-de-sac. Limited visualization of the lower thorax is degraded secondary to patient respiratory artifact. Trace bilateral pleural effusions, right greater than left, with associated dependent subpleural opacities, likely atelectasis. Cardiomegaly. Coronary artery calcifications. No pericardial effusion. No acute or aggressive osseous abnormalities. Mild-to-moderate multilevel lumbar spine DDD, worse at L2-L3 with disc space height loss, endplate irregularity and sclerosis. Mild diffuse body wall anasarca. Regional soft tissues appear otherwise normal. IMPRESSION: 1. Cardiomegaly with findings worrisome for pulmonary edema including trace bilateral effusions, right greater than left, mild diffuse body wall anasarca and minimal amount of mesenteric stranding / third-spacing throughout the peritoneum. Clinical correlation is advised. 2. Cholelithiasis with potential minimal amount of gallbladder wall thickening and pericholecystic fluid, again, potentially attributable to suspected third-spacing within the peritoneal cavity - if clinical concern persists for acute cholecystitis, further evaluation with gallbladder ultrasound and/or nuclear medicine HIDA scan could be performed as clinically indicated. 3. Coronary artery calcifications. 4. Prostatomegaly. Electronically Signed   By: JSandi MariscalM.D.   On:  12/07/2014 11:43    Assessment/Plan  1. Malignant neoplasm of transverse colon [facility]) Patient is an observe  only status present time. No further active treatment at this point.  2. Anemia, iron deficiency Remains on iron supplement.  3. Chronic diastolic CHF (congestive heart failure) ([place]) Compensated  4. Chronic kidney disease, stage 2 (mild) Follow-up lab routinely  5. Essential hypertension Controlled  6. Hypothyroidism, unspecified type Follow-up TSH routinely Remain on current dose of levothyroxine  7. Late onset Alzheimer's disease without behavioral disturbance Continue monitor

## 2015-02-14 DIAGNOSIS — Z961 Presence of intraocular lens: Secondary | ICD-10-CM | POA: Diagnosis not present

## 2015-02-14 DIAGNOSIS — H2512 Age-related nuclear cataract, left eye: Secondary | ICD-10-CM | POA: Diagnosis not present

## 2015-02-14 DIAGNOSIS — H30103 Unspecified disseminated chorioretinal inflammation, bilateral: Secondary | ICD-10-CM | POA: Diagnosis not present

## 2015-02-15 DIAGNOSIS — E039 Hypothyroidism, unspecified: Secondary | ICD-10-CM | POA: Diagnosis not present

## 2015-02-15 DIAGNOSIS — I10 Essential (primary) hypertension: Secondary | ICD-10-CM | POA: Diagnosis not present

## 2015-02-15 DIAGNOSIS — G459 Transient cerebral ischemic attack, unspecified: Secondary | ICD-10-CM | POA: Diagnosis not present

## 2015-02-15 DIAGNOSIS — I4891 Unspecified atrial fibrillation: Secondary | ICD-10-CM | POA: Diagnosis not present

## 2015-02-15 DIAGNOSIS — H3093 Unspecified chorioretinal inflammation, bilateral: Secondary | ICD-10-CM | POA: Diagnosis not present

## 2015-02-15 DIAGNOSIS — R609 Edema, unspecified: Secondary | ICD-10-CM | POA: Diagnosis not present

## 2015-02-15 DIAGNOSIS — R4182 Altered mental status, unspecified: Secondary | ICD-10-CM | POA: Diagnosis not present

## 2015-02-15 DIAGNOSIS — R2681 Unsteadiness on feet: Secondary | ICD-10-CM | POA: Diagnosis not present

## 2015-02-15 DIAGNOSIS — L570 Actinic keratosis: Secondary | ICD-10-CM | POA: Diagnosis not present

## 2015-02-15 DIAGNOSIS — M25569 Pain in unspecified knee: Secondary | ICD-10-CM | POA: Diagnosis not present

## 2015-02-15 DIAGNOSIS — G9009 Other idiopathic peripheral autonomic neuropathy: Secondary | ICD-10-CM | POA: Diagnosis not present

## 2015-02-15 DIAGNOSIS — D649 Anemia, unspecified: Secondary | ICD-10-CM | POA: Diagnosis not present

## 2015-03-01 ENCOUNTER — Encounter: Payer: Self-pay | Admitting: Nurse Practitioner

## 2015-03-01 ENCOUNTER — Non-Acute Institutional Stay (SKILLED_NURSING_FACILITY): Payer: Medicare Other | Admitting: Nurse Practitioner

## 2015-03-01 DIAGNOSIS — F0280 Dementia in other diseases classified elsewhere without behavioral disturbance: Secondary | ICD-10-CM

## 2015-03-01 DIAGNOSIS — G909 Disorder of the autonomic nervous system, unspecified: Secondary | ICD-10-CM

## 2015-03-01 DIAGNOSIS — N400 Enlarged prostate without lower urinary tract symptoms: Secondary | ICD-10-CM | POA: Diagnosis not present

## 2015-03-01 DIAGNOSIS — E039 Hypothyroidism, unspecified: Secondary | ICD-10-CM

## 2015-03-01 DIAGNOSIS — G990 Autonomic neuropathy in diseases classified elsewhere: Secondary | ICD-10-CM

## 2015-03-01 DIAGNOSIS — Z8679 Personal history of other diseases of the circulatory system: Secondary | ICD-10-CM | POA: Diagnosis not present

## 2015-03-01 DIAGNOSIS — M1990 Unspecified osteoarthritis, unspecified site: Secondary | ICD-10-CM | POA: Diagnosis not present

## 2015-03-01 DIAGNOSIS — I10 Essential (primary) hypertension: Secondary | ICD-10-CM | POA: Diagnosis not present

## 2015-03-01 DIAGNOSIS — K5900 Constipation, unspecified: Secondary | ICD-10-CM | POA: Diagnosis not present

## 2015-03-01 DIAGNOSIS — I5032 Chronic diastolic (congestive) heart failure: Secondary | ICD-10-CM

## 2015-03-01 DIAGNOSIS — G308 Other Alzheimer's disease: Secondary | ICD-10-CM

## 2015-03-01 DIAGNOSIS — D509 Iron deficiency anemia, unspecified: Secondary | ICD-10-CM | POA: Diagnosis not present

## 2015-03-01 NOTE — Assessment & Plan Note (Signed)
Multiple sites, continue Tylenol 1000mg  daily.

## 2015-03-01 NOTE — Assessment & Plan Note (Signed)
No urinary retention. cotninueTamsulosin 0.8 mg daily.  

## 2015-03-01 NOTE — Assessment & Plan Note (Signed)
Off Namenda, SNF for care needs, baseline confusion.  

## 2015-03-01 NOTE — Assessment & Plan Note (Signed)
Clinically compensated, continue Furosemide 40mg daily 

## 2015-03-01 NOTE — Assessment & Plan Note (Signed)
12/06/14 Hgb 4.5, MCV76.8, MCH 22.2, RDW 16.2 12/27/14 Hgb 8.3, s/p transfusion, s/p colectomy for colon sarcoma.  12/28/14 added Iron 325mg  daily, CBC/BMP 01/03/15 01/31/15 Hgb 10.2, continue Fe 325mg  daily.

## 2015-03-01 NOTE — Progress Notes (Signed)
Patient ID: Jesse Burton, male   DOB: 04/12/1925, 82 y.o.   MRN: [id number]  Location:  SNF [facility] Provider:  Marlana Latus NP  Code Status:  DNR Goals of care: Advanced Directive information    Chief Complaint  Patient presents with  . Medical Management of Chronic Issues  . Acute Visit    fell 02/28/15     HPI: Patient is a 82 y.o. male seen in the SNF at [facility] today for evaluation of fall 02/28/15, found on the floor, the patient stated he was looking under the bed for his glasses, small skin tear the right hand noted. Hx of anemia, s/p partial colectomy.  CAD has been stable, no angina since last visit, hypothyroidism has been supplemented well with last TSH wnl 05/2014, HTN is controlled, his memory has been gradual decline but functioning well in SNF, off [facility], peripheral neuropathy has been stable, he walks with walker for short distance and w/c to go further.    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 The patient presented to ED with Hgb 4.5, had PRBC transfusion, a large transverse colon sarcoma identified. He is healing nicely, abd surgical wound staple removed 12/27/14. He is self propelling in w/c on unit for mobility.     Review of Systems  Constitutional: Negative for fever, chills and diaphoresis.  HENT: Positive for hearing loss. Negative for congestion and ear pain.   Eyes: Negative for pain, discharge and redness.  Respiratory: Negative for cough and shortness of breath.   Cardiovascular: Negative for chest pain, palpitations and leg swelling.  Gastrointestinal: Positive for constipation. Negative for nausea, vomiting, abdominal pain and diarrhea.  Genitourinary: Positive for frequency. Negative for dysuria and urgency.  Musculoskeletal: Positive for back pain. Negative for myalgias and neck pain.       W/c for mobility.   Skin: Negative for rash.       abd laparoscopic surgical scars. R hand skin tear  Neurological: Negative for  dizziness, tremors, seizures, weakness and headaches.       Peripheral neuropathy  Psychiatric/Behavioral: Positive for memory loss. Negative for suicidal ideas and hallucinations. The patient is not nervous/anxious.        Mood has been stabilized.     Past Medical History  Diagnosis Date  . History of atrial fibrillation   . Thyroid disease   . Memory loss   . Stroke ([place])   . 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 Transient cerebral ischemia 07/04/2012    a posterior circulation TIA in October 2009.    Marland Kitchen HTN (hypertension) 12/01/2013  . CAD (coronary artery disease) of artery bypass graft 12/02/2013  . Hypothyroidism 12/01/2013  . Peripheral autonomic neuropathy in disorders classified elsewhere(337.1) 07/04/2012  . 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 Hereditary and idiopathic peripheral neuropathy 07/04/2012    a history of chronic sensory polyneuropathy of undetermined origin with abnormality of gait. He is walking with a rolling walker. Last MRA of the head January 2013 shows no significant stenosis, MRA of the neck shows mild stenosis of the proximal left internal carotid artery.    . Abnormality of gait 07/04/2012  . Dizziness and giddiness 07/04/2012  . Benign prostatic hyperplasia   . Edema   . Chorioretinitis   . Panuveitis   . Internal carotid artery stenosis     left  . Mild left ventricular hypertrophy   . Actinic keratoses   . Diverticulitis   . Prostatitis, acute  1991  . Arthritis   . Neuromuscular disorder ([place])   . Essential hypertension   . Hypothyroidism   . BPH (benign prostatic hyperplasia)   . Retinitis   . HLD (hyperlipidemia)   . CAD (coronary artery disease) of bypass graft     a. s/p CABG 1991 without subsequent interventions.  . Chronic diastolic CHF (congestive heart failure) ([place])   . Hyperlipidemia   . Dementia   . Colon cancer ([place])     a. admitted to [facility] 12/07/14 with profound iron deficiency anemia Hgb 4.5,  melena, with new diagnosis of invasive adenocarcinoma of colon s/p lap R colectomy on 12/15/14.  . Iron deficiency anemia     a. 11/2014 - Hgb 4.5 in setting of newly dx colon CA.   Marland Kitchen PAF (paroxysmal atrial fibrillation) ([place])     a. Not on anticoag due to bleeding and fall risk.    Patient Active Problem List   Diagnosis Date Noted  . Chronic diastolic CHF (congestive heart failure) ([place])   . Chronic kidney disease 12/28/2014  . Leukocytosis   . Colon cancer ([place]) of the transverse colon 12/12/2014  . Anemia, iron deficiency   . Encounter for monitoring antiplatelet therapy   . PAF (paroxysmal atrial fibrillation) ([place]) 12/07/2014  . Arthritis   . Hypothyroidism   . Essential hypertension   . Skin cancer 11/11/2014  . Constipation 10/10/2014  . Blepharitis of eyelid of right eye 02/24/2014  . Allergy 01/24/2014  . CAD (coronary artery disease) of artery bypass graft 12/02/2013  . Benign prostatic hyperplasia   . Edema   . Hypothyroidism 12/01/2013  . Alzheimer's disease 07/04/2012  . Peripheral autonomic neuropathy in disorders classified elsewhere 07/04/2012  . 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 Hereditary and idiopathic peripheral neuropathy 07/04/2012  . Abnormality of gait 07/04/2012  . Transient cerebral ischemia 07/04/2012  . 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 History of atrial fibrillation   . 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 Sulfa Antibiotics   . Sulfa Antibiotics Other (See Comments)    Patient states he does not recall an allergy to sulfa  . Ativan [Lorazepam] Anxiety    Medications: Patient's Medications  New Prescriptions   No medications on file  Previous Medications   ACETAMINOPHEN (TYLENOL) 325 MG TABLET    Take 650 mg by mouth every 6 (six) hours as needed (for mild pain.).   AMIODARONE (PACERONE) 200 MG  TABLET    Take 1 tablet (200 mg total) by mouth daily.   ATORVASTATIN (LIPITOR) 40 MG TABLET    Take 40 mg by mouth daily.   CARVEDILOL (COREG) 3.125 MG TABLET    Take 1 tablet (3.125 mg total) by mouth 2 (two) times daily with a meal.   ERYTHROMYCIN OPHTHALMIC OINTMENT    Place 1 application into the right eye at bedtime.   FEEDING SUPPLEMENT, ENSURE ENLIVE, (ENSURE ENLIVE) LIQD    Take 237 mLs by mouth 2 (two) times daily between meals.   FERROUS SULFATE 325 (65 FE) MG TABLET    Take 325 mg by mouth daily with breakfast.   FUROSEMIDE (LASIX) 20 MG TABLET    Take 20 mg by mouth daily. Taking 1 tablet every other day, and 2 pills = 40 mg on the other days   GLUCOSAMINE-CHONDROITIN 750-600 MG TABS    Take 2 tablets by mouth daily.   L-METHYLFOLATE-B12-B6-B2 (CEREFOLIN  PO)    Take by mouth daily.     L-METHYLFOLATE-B6-B12 (METANX) 3-35-2 MG TABS TABLET    Take 1 tablet by mouth daily.   LEVALBUTEROL (XOPENEX) 0.63 MG/3ML NEBULIZER SOLUTION    Take 3 mLs (0.63 mg total) by nebulization every 6 (six) hours as needed for wheezing or shortness of breath.   LEVOTHYROXINE (SYNTHROID, LEVOTHROID) 88 MCG TABLET    Take 88 mcg by mouth daily before breakfast.   MULTIPLE VITAMIN (MULTIVITAMIN WITH MINERALS) TABS TABLET    Take 1 tablet by mouth daily. CERTA-VITE SENIOR   MULTIVITAMIN (METANX) 3-35-2 MG TABS TABLET    Take 1 tablet by mouth daily.   MYCOPHENOLATE (CELLCEPT) 500 MG TABLET    Take 500 mg by mouth daily.   POLYETHYLENE GLYCOL (MIRALAX / GLYCOLAX) PACKET    Take 17 g by mouth daily.   POTASSIUM CHLORIDE SA (K-DUR,KLOR-CON) 20 MEQ TABLET    Take 1 tablet (20 mEq total) by mouth daily.   TAMSULOSIN (FLOMAX) 0.4 MG CAPS CAPSULE    Take 0.8 mg by mouth daily.  Modified Medications   No medications on file  Discontinued Medications   No medications on file    Physical Exam: Filed Vitals:   03/01/15 1255  BP: 118/60  Pulse: 62  Temp: 97.2 F (36.2 C)  TempSrc: Tympanic  Resp: 18   There  is no weight on file to calculate BMI.  Physical Exam  Constitutional: He appears well-developed and well-nourished. No distress.  HENT:  Head: Normocephalic and atraumatic.  Right Ear: External ear normal.  Left Ear: External ear normal.  Nose: Nose normal.  Mouth/Throat: Oropharynx is clear and moist. No oropharyngeal exudate.  Eyes: Conjunctivae and EOM are normal. Pupils are equal, round, and reactive to light. Right eye exhibits no discharge. Left eye exhibits no discharge. No scleral icterus.  Mild. Crusty matter right eyelashes.    Neck: Normal range of motion. Neck supple. No JVD present. No tracheal deviation present. No thyromegaly present.  Cardiovascular: Normal rate and regular rhythm.  Exam reveals no gallop and no friction rub.   Murmur heard. Systolic murmur [id number] right sternal border   Pulmonary/Chest: Effort normal and breath sounds normal. No stridor. No respiratory distress. He has no wheezes. He has no rales. He exhibits no tenderness.  Abdominal: Soft. Bowel sounds are normal. He exhibits no distension and no mass. There is no tenderness.  Musculoskeletal: Normal range of motion. He exhibits no edema.  Neurological: He is alert. He has normal reflexes. No cranial nerve deficit. He exhibits normal muscle tone. Coordination normal.  Skin: Skin is warm and dry. No rash noted. He is not diaphoretic. No erythema. No pallor.  Small skin tear to the right hand.   Psychiatric: He has a normal mood and affect. Thought content normal. Cognition and memory are impaired. He exhibits abnormal recent memory. He exhibits normal remote memory.  Memory deficits.     Labs reviewed: Basic Metabolic Panel:  Recent Labs  12/19/14 0539 12/20/14 0510 12/21/14 0500 12/27/14 01/03/15 01/31/15  NA 139 137 136 138 136* 139  K 3.8 3.1* 3.2* 4.3 4.0 4.2  CL 104 102 104  --   --   --   CO2 27 28 26   --   --   --   GLUCOSE 109* 97 98  --   --   --   BUN 21* 27* 22* 15 14 20     CREATININE 1.04 1.16 1.11 1.2 1.0 1.2  CALCIUM  8.9 8.4* 8.4*  --   --   --   MG  --  1.9  --   --   --   --     Liver Function Tests:  Recent Labs  05/27/14 12/06/14 12/07/14 0144  AST 18 16 21   ALT 15 14 20   ALKPHOS 60 66 79  BILITOT  --   --  0.9  PROT  --   --  5.8*  ALBUMIN  --   --  3.6    CBC:  Recent Labs  12/07/14 0144  12/19/14 0539 12/20/14 0510 12/21/14 0500 12/27/14 01/03/15 01/31/15  WBC 4.3  < > 7.5 6.2 8.2 4.2 4.2 4.4  NEUTROABS 2.7  --   --   --   --   --   --   --   HGB 4.9*  < > 10.0* 9.3* 8.8* 8.3* 7.8* 10.2*  HCT 16.5*  < > 33.0* 30.1* 28.1* 27* 26* 33*  MCV 77.5*  < > 80.3 79.4 77.8*  --   --   --   PLT 273  < > 342 369 336 321 314 257  < > = values in this interval not displayed.  Lab Results  Component Value Date   TSH 4.60 01/31/2015   Lab Results  Component Value Date   HGBA1C 5.9* 12/15/2014   Lab Results  Component Value Date   CHOL 93 12/07/2014   HDL 36* 12/07/2014   LDLCALC 47 12/07/2014   TRIG 51 12/07/2014   CHOLHDL 2.6 12/07/2014    Significant Diagnostic Results since last visit: none  Patient Care Team: Estill Dooms, MD as PCP - General (Internal Medicine) Zebedee Iba, MD as Referring Physician (Ophthalmology) Darlin Coco, MD as Consulting Physician (Cardiology) Garvin Fila, MD as Consulting Physician (Neurology) Festus Aloe, MD as Consulting Physician (Urology) Marilynne Halsted, MD as Referring Physician (Ophthalmology) Lavonna Monarch, MD as Consulting Physician (Dermatology) Gerarda Fraction, MD as Referring Physician (Ophthalmology)  Assessment/Plan Problem List Items Addressed This Visit    Peripheral autonomic neuropathy in disorders classified elsewhere    11/15/14 decrease Cellcept to 500mg  daily, then dc in 3 months-toxicity. 03/01/15 continue Cellcept 500mg  daily, f/u Neurology      Hypothyroidism - Primary (Chronic)    Continue Levothyroxine 55mcg daily, 01/31/15 TSH 4.6      History  of atrial fibrillation (Chronic)    Heart rate is in control, continue Amiodarone 200mg  daily.       Essential hypertension (Chronic)    Controlled, continue Coreg 3.125mg  bid, Atorvastatin 40mg  daily for risk reduction.       Constipation    Stable, continue MiraLax daily.       Chronic diastolic CHF (congestive heart failure) ([facility]) (Chronic)    Clinically compensated, continue Furosemide 40mg  daily.       Benign prostatic hyperplasia    No urinary retention. cotninueTamsulosin 0.8 mg daily.      Arthritis    Multiple sites, continue Tylenol 1000mg  daily.      Anemia, iron deficiency (Chronic)    12/06/14 Hgb 4.5, MCV76.8, MCH 22.2, RDW 16.2 12/27/14 Hgb 8.3, s/p transfusion, s/p colectomy for colon sarcoma.  12/28/14 added Iron 325mg  daily, CBC/BMP 01/03/15 01/31/15 Hgb 10.2, continue Fe 325mg  daily.       Alzheimer's disease (Chronic)    Off Namenda, SNF for care needs, baseline confusion.           Family/ staff Communication: SNF for care, fall risk  Labs/tests ordered:  none  ManXie Jasir Rother NP [facility] [phone], [place] [address] On Call:  [phone] & follow prompts after 5pm & weekends Office Phone:  [phone] Office Fax:  [phone]

## 2015-03-01 NOTE — Assessment & Plan Note (Signed)
11/15/14 decrease Cellcept to 500mg  daily, then dc in 3 months-toxicity. 03/01/15 continue Cellcept 500mg  daily, f/u Neurology

## 2015-03-01 NOTE — Assessment & Plan Note (Signed)
Heart rate is in control, continue Amiodarone 200mg daily.  

## 2015-03-01 NOTE — Assessment & Plan Note (Signed)
Continue Levothyroxine 75mcg daily, 01/31/15 TSH 4.6

## 2015-03-01 NOTE — Assessment & Plan Note (Signed)
Stable, continue MiraLax daily.  

## 2015-03-01 NOTE — Assessment & Plan Note (Signed)
Controlled, continue Coreg 3.125mg bid, Atorvastatin 40mg daily for risk reduction.    

## 2015-03-27 ENCOUNTER — Non-Acute Institutional Stay (SKILLED_NURSING_FACILITY): Payer: Medicare Other | Admitting: Nurse Practitioner

## 2015-03-27 ENCOUNTER — Encounter: Payer: Self-pay | Admitting: Nurse Practitioner

## 2015-03-27 DIAGNOSIS — Z8679 Personal history of other diseases of the circulatory system: Secondary | ICD-10-CM

## 2015-03-27 DIAGNOSIS — N400 Enlarged prostate without lower urinary tract symptoms: Secondary | ICD-10-CM | POA: Diagnosis not present

## 2015-03-27 DIAGNOSIS — G909 Disorder of the autonomic nervous system, unspecified: Secondary | ICD-10-CM | POA: Diagnosis not present

## 2015-03-27 DIAGNOSIS — I10 Essential (primary) hypertension: Secondary | ICD-10-CM | POA: Diagnosis not present

## 2015-03-27 DIAGNOSIS — C184 Malignant neoplasm of transverse colon: Secondary | ICD-10-CM

## 2015-03-27 DIAGNOSIS — G308 Other Alzheimer's disease: Secondary | ICD-10-CM | POA: Diagnosis not present

## 2015-03-27 DIAGNOSIS — G990 Autonomic neuropathy in diseases classified elsewhere: Secondary | ICD-10-CM

## 2015-03-27 DIAGNOSIS — E039 Hypothyroidism, unspecified: Secondary | ICD-10-CM | POA: Diagnosis not present

## 2015-03-27 DIAGNOSIS — K5900 Constipation, unspecified: Secondary | ICD-10-CM | POA: Diagnosis not present

## 2015-03-27 DIAGNOSIS — M1990 Unspecified osteoarthritis, unspecified site: Secondary | ICD-10-CM

## 2015-03-27 DIAGNOSIS — F0280 Dementia in other diseases classified elsewhere without behavioral disturbance: Secondary | ICD-10-CM

## 2015-03-27 DIAGNOSIS — D509 Iron deficiency anemia, unspecified: Secondary | ICD-10-CM

## 2015-03-27 DIAGNOSIS — I5032 Chronic diastolic (congestive) heart failure: Secondary | ICD-10-CM | POA: Diagnosis not present

## 2015-03-27 NOTE — Progress Notes (Signed)
Patient ID: Jesse Burton, male   DOB: 01-Apr-1925, 82 y.o.   MRN: [id number]

## 2015-03-27 NOTE — Assessment & Plan Note (Signed)
Off Namenda, SNF for care needs, baseline confusion.  

## 2015-03-27 NOTE — Assessment & Plan Note (Signed)
12/27/14 s/p partial colectomy 12/15/14 by Dr. Toth, staple removed.   

## 2015-03-27 NOTE — Assessment & Plan Note (Signed)
11/15/14 decrease Cellcept to 500mg  daily, then dc in 3 months-toxicity. 03/01/15 continue Cellcept 500mg  daily, f/u Neurology

## 2015-03-27 NOTE — Assessment & Plan Note (Signed)
Controlled, continue Coreg 3.125mg bid, Atorvastatin 40mg daily for risk reduction.    

## 2015-03-27 NOTE — Assessment & Plan Note (Signed)
Clinically compensated, continue Furosemide 40mg daily 

## 2015-03-27 NOTE — Assessment & Plan Note (Signed)
Multiple sites, continue Tylenol 1000mg  daily.

## 2015-03-27 NOTE — Assessment & Plan Note (Signed)
Continue Levothyroxine 65mcg daily, 01/31/15 TSH 4.6

## 2015-03-27 NOTE — Assessment & Plan Note (Signed)
Stable, continue MiraLax daily.  

## 2015-03-27 NOTE — Assessment & Plan Note (Signed)
Heart rate is in control, continue Amiodarone 200mg daily.  

## 2015-03-27 NOTE — Assessment & Plan Note (Signed)
12/06/14 Hgb 4.5, MCV76.8, MCH 22.2, RDW 16.2 12/27/14 Hgb 8.3, s/p transfusion, s/p colectomy for colon sarcoma.  12/28/14 added Iron 325mg  daily, CBC/BMP 01/03/15 01/31/15 Hgb 10.2, continue Fe 325mg  daily.

## 2015-03-27 NOTE — Progress Notes (Signed)
Patient ID: Jesse Burton, male   DOB: [birth year], 82 y.o.   MRN: [id number]  Location:  SNF [facility] Provider:  Marlana Latus NP  Code Status:  DNR Goals of care: Advanced Directive infomation Does patient have an advance directive?: No  Chief Complaint  Patient presents with  . Medical Management of Chronic Issues    Routine Visit     HPI: Patient is a 82 y.o. male seen in the SNF at [facility] today for evaluation of Hx of anemia, s/p partial colectomy.  CAD has been stable, no angina since last visit, hypothyroidism has been supplemented well with last TSH wnl 05/2014, HTN is controlled, his memory has been gradual decline but functioning well in SNF, off [facility], peripheral neuropathy has been stable, he walks with walker for short distance and w/c to go further.    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 The patient presented to ED with Hgb 4.5, had PRBC transfusion, a large transverse colon sarcoma identified. He is healing nicely, abd surgical wound staple removed 12/27/14. He is self propelling in w/c on unit for mobility.     Review of Systems  Constitutional: Negative for fever, chills and diaphoresis.  HENT: Positive for hearing loss. Negative for congestion and ear pain.   Eyes: Negative for pain, discharge and redness.  Respiratory: Negative for cough and shortness of breath.   Cardiovascular: Negative for chest pain, palpitations and leg swelling.  Gastrointestinal: Positive for constipation. Negative for nausea, vomiting, abdominal pain and diarrhea.  Genitourinary: Positive for frequency. Negative for dysuria and urgency.  Musculoskeletal: Positive for back pain. Negative for myalgias and neck pain.       W/c for mobility.   Skin: Negative for rash.       abd laparoscopic surgical scars. R hand skin tear  Neurological: Negative for dizziness, tremors, seizures, weakness and headaches.       Peripheral neuropathy  Psychiatric/Behavioral:  Positive for memory loss. Negative for suicidal ideas and hallucinations. The patient is not nervous/anxious.        Mood has been stabilized.     Past Medical History  Diagnosis Date  . History of atrial fibrillation   . Thyroid disease   . Memory loss   . Stroke ([place])   . 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 Transient cerebral ischemia 07/04/2012    a posterior circulation TIA in October 2009.    Marland Kitchen HTN (hypertension) 12/01/2013  . CAD (coronary artery disease) of artery bypass graft 12/02/2013  . Hypothyroidism 12/01/2013  . Peripheral autonomic neuropathy in disorders classified elsewhere(337.1) 07/04/2012  . 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 Hereditary and idiopathic peripheral neuropathy 07/04/2012    a history of chronic sensory polyneuropathy of undetermined origin with abnormality of gait. He is walking with a rolling walker. Last MRA of the head January 2013 shows no significant stenosis, MRA of the neck shows mild stenosis of the proximal left internal carotid artery.    . Abnormality of gait 07/04/2012  . Dizziness and giddiness 07/04/2012  . Benign prostatic hyperplasia   . Edema   . Chorioretinitis   . Panuveitis   . Internal carotid artery stenosis     left  . Mild left ventricular hypertrophy   . Actinic keratoses   . Diverticulitis   . Prostatitis, acute     1991  . Arthritis   . Neuromuscular disorder ([place])   . Essential hypertension   . Hypothyroidism   .  BPH (benign prostatic hyperplasia)   . Retinitis   . HLD (hyperlipidemia)   . CAD (coronary artery disease) of bypass graft     a. s/p CABG 1991 without subsequent interventions.  . Chronic diastolic CHF (congestive heart failure) ([place])   . Hyperlipidemia   . Dementia   . Colon cancer ([place])     a. admitted to [facility] 12/07/14 with profound iron deficiency anemia Hgb 4.5, melena, with new diagnosis of invasive adenocarcinoma of colon s/p lap R colectomy on 12/15/14.  . Iron deficiency  anemia     a. 11/2014 - Hgb 4.5 in setting of newly dx colon CA.   Marland Kitchen PAF (paroxysmal atrial fibrillation) ([place])     a. Not on anticoag due to bleeding and fall risk.    Patient Active Problem List   Diagnosis Date Noted  . Chronic diastolic CHF (congestive heart failure) ([place])   . Chronic kidney disease 12/28/2014  . Leukocytosis   . Colon cancer ([place]) of the transverse colon 12/12/2014  . Anemia, iron deficiency   . Encounter for monitoring antiplatelet therapy   . PAF (paroxysmal atrial fibrillation) ([place]) 12/07/2014  . Arthritis   . Hypothyroidism   . Essential hypertension   . Skin cancer 11/11/2014  . Constipation 10/10/2014  . Blepharitis of eyelid of right eye 02/24/2014  . Allergy 01/24/2014  . CAD (coronary artery disease) of artery bypass graft 12/02/2013  . Benign prostatic hyperplasia   . Edema   . Hypothyroidism 12/01/2013  . Alzheimer's disease 07/04/2012  . Peripheral autonomic neuropathy in disorders classified elsewhere 07/04/2012  . 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 Hereditary and idiopathic peripheral neuropathy 07/04/2012  . Abnormality of gait 07/04/2012  . Transient cerebral ischemia 07/04/2012  . 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 History of atrial fibrillation   . 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 Sulfa Antibiotics   . Sulfa Antibiotics Other (See Comments)    Patient states he does not recall an allergy to sulfa  . Ativan [Lorazepam] Anxiety    Medications: Patient's Medications  New Prescriptions   No medications on file  Previous Medications   ACETAMINOPHEN (TYLENOL) 325 MG TABLET    Take 650 mg by mouth every 6 (six) hours as needed (for mild pain.).   ALBUTEROL (PROVENTIL) (2.5 MG/3ML) 0.083% NEBULIZER SOLUTION    Take 2.5 mg by nebulization every 6 (six) hours as needed for wheezing or shortness  of breath.   AMIODARONE (PACERONE) 200 MG TABLET    Take 1 tablet (200 mg total) by mouth daily.   ATORVASTATIN (LIPITOR) 40 MG TABLET    Take 40 mg by mouth daily.   CARVEDILOL (COREG) 3.125 MG TABLET    Take 1 tablet (3.125 mg total) by mouth 2 (two) times daily with a meal.   ERYTHROMYCIN OPHTHALMIC OINTMENT    Place 1 application into the right eye at bedtime.   FEEDING SUPPLEMENT, ENSURE ENLIVE, (ENSURE ENLIVE) LIQD    Take 237 mLs by mouth 2 (two) times daily between meals.   FERROUS SULFATE 325 (65 FE) MG TABLET    Take 325 mg by mouth daily with breakfast.   FUROSEMIDE (LASIX) 20 MG TABLET    Take 40 mg by mouth daily.    GLUCOSAMINE-CHONDROITIN 750-600 MG TABS    Take 2 tablets by mouth daily.   L-METHYLFOLATE-B12-B6-B2 (CEREFOLIN PO)    Take by mouth daily.  L-METHYLFOLATE-B6-B12 (METANX) 3-35-2 MG TABS TABLET    Take 1 tablet by mouth daily.   LEVOTHYROXINE (SYNTHROID, LEVOTHROID) 88 MCG TABLET    Take 88 mcg by mouth daily before breakfast.   MULTIPLE VITAMIN (MULTIVITAMIN WITH MINERALS) TABS TABLET    Take 1 tablet by mouth daily. CERTA-VITE SENIOR   MYCOPHENOLATE (CELLCEPT) 500 MG TABLET    Take 500 mg by mouth daily.   POLYETHYLENE GLYCOL (MIRALAX / GLYCOLAX) PACKET    Take 17 g by mouth daily.   POTASSIUM CHLORIDE SA (K-DUR,KLOR-CON) 20 MEQ TABLET    Take 1 tablet (20 mEq total) by mouth daily.   TAMSULOSIN (FLOMAX) 0.4 MG CAPS CAPSULE    Take 0.8 mg by mouth daily.  Modified Medications   No medications on file  Discontinued Medications   LEVALBUTEROL (XOPENEX) 0.63 MG/3ML NEBULIZER SOLUTION    Take 3 mLs (0.63 mg total) by nebulization every 6 (six) hours as needed for wheezing or shortness of breath.   MULTIVITAMIN (METANX) 3-35-2 MG TABS TABLET    Take 1 tablet by mouth daily.    Physical Exam: Filed Vitals:   03/27/15 0918  BP: 120/70  Pulse: 59  Temp: 97.7 F (36.5 C)  TempSrc: Oral  Resp: 19  Height: 6' (1.829 m)  Weight: 184 lb 3.2 oz (83.553 kg)   Body  mass index is 24.98 kg/(m^2).  Physical Exam  Constitutional: He appears well-developed and well-nourished. No distress.  HENT:  Head: Normocephalic and atraumatic.  Right Ear: External ear normal.  Left Ear: External ear normal.  Nose: Nose normal.  Mouth/Throat: Oropharynx is clear and moist. No oropharyngeal exudate.  Eyes: Conjunctivae and EOM are normal. Pupils are equal, round, and reactive to light. Right eye exhibits no discharge. Left eye exhibits no discharge. No scleral icterus.     Neck: Normal range of motion. Neck supple. No JVD present. No tracheal deviation present. No thyromegaly present.  Cardiovascular: Normal rate and regular rhythm.  Exam reveals no gallop and no friction rub.   Murmur heard. Systolic murmur [id number] right sternal border   Pulmonary/Chest: Effort normal and breath sounds normal. No stridor. No respiratory distress. He has no wheezes. He has no rales. He exhibits no tenderness.  Abdominal: Soft. Bowel sounds are normal. He exhibits no distension and no mass. There is no tenderness.  Musculoskeletal: Normal range of motion. He exhibits no edema.  Neurological: He is alert. He has normal reflexes. No cranial nerve deficit. He exhibits normal muscle tone. Coordination normal.  Skin: Skin is warm and dry. No rash noted. He is not diaphoretic. No erythema. No pallor.  Small skin tear to the right hand.   Psychiatric: He has a normal mood and affect. Thought content normal. Cognition and memory are impaired. He exhibits abnormal recent memory. He exhibits normal remote memory.  Memory deficits.     Labs reviewed: Basic Metabolic Panel:  Recent Labs  12/19/14 0539 12/20/14 0510 12/21/14 0500 12/27/14 01/03/15 01/31/15  NA 139 137 136 138 136* 139  K 3.8 3.1* 3.2* 4.3 4.0 4.2  CL 104 102 104  --   --   --   CO2 27 28 26   --   --   --   GLUCOSE 109* 97 98  --   --   --   BUN 21* 27* 22* 15 14 20   CREATININE 1.04 1.16 1.11 1.2 1.0 1.2  CALCIUM 8.9  8.4* 8.4*  --   --   --   MG  --  1.9  --   --   --   --     Liver Function Tests:  Recent Labs  05/27/14 12/06/14 12/07/14 0144  AST 18 16 21   ALT 15 14 20   ALKPHOS 60 66 79  BILITOT  --   --  0.9  PROT  --   --  5.8*  ALBUMIN  --   --  3.6    CBC:  Recent Labs  12/07/14 0144  12/19/14 0539 12/20/14 0510 12/21/14 0500 12/27/14 01/03/15 01/31/15  WBC 4.3  < > 7.5 6.2 8.2 4.2 4.2 4.4  NEUTROABS 2.7  --   --   --   --   --   --   --   HGB 4.9*  < > 10.0* 9.3* 8.8* 8.3* 7.8* 10.2*  HCT 16.5*  < > 33.0* 30.1* 28.1* 27* 26* 33*  MCV 77.5*  < > 80.3 79.4 77.8*  --   --   --   PLT 273  < > 342 369 336 321 314 257  < > = values in this interval not displayed.  Lab Results  Component Value Date   TSH 4.60 01/31/2015   Lab Results  Component Value Date   HGBA1C 5.9* 12/15/2014   Lab Results  Component Value Date   CHOL 93 12/07/2014   HDL 36* 12/07/2014   LDLCALC 47 12/07/2014   TRIG 51 12/07/2014   CHOLHDL 2.6 12/07/2014    Significant Diagnostic Results since last visit: none  Patient Care Team: Estill Dooms, MD as PCP - General (Internal Medicine) Zebedee Iba, MD as Referring Physician (Ophthalmology) Darlin Coco, MD as Consulting Physician (Cardiology) Garvin Fila, MD as Consulting Physician (Neurology) Festus Aloe, MD as Consulting Physician (Urology) Marilynne Halsted, MD as Referring Physician (Ophthalmology) Lavonna Monarch, MD as Consulting Physician (Dermatology) Gerarda Fraction, MD as Referring Physician (Ophthalmology)  Assessment/Plan Problem List Items Addressed This Visit    History of atrial fibrillation - Primary (Chronic)    Heart rate is in control, continue Amiodarone 200mg  daily.       Alzheimer's disease (Chronic)    Off [facility], SNF for care needs, baseline confusion.      Hypothyroidism (Chronic)    Continue Levothyroxine 75mcg daily, 01/31/15 TSH 4.6      Essential hypertension (Chronic)    Controlled, continue  Coreg 3.125mg  bid, Atorvastatin 40mg  daily for risk reduction.       Anemia, iron deficiency (Chronic)    12/06/14 Hgb 4.5, MCV76.8, MCH 22.2, RDW 16.2 12/27/14 Hgb 8.3, s/p transfusion, s/p colectomy for colon sarcoma.  12/28/14 added Iron 325mg  daily, CBC/BMP 01/03/15 01/31/15 Hgb 10.2, continue Fe 325mg  daily.       Colon cancer [facility]) of the transverse colon (Chronic)    12/27/14 s/p partial colectomy 12/15/14 by Dr. Marlou Starks, staple removed.       Chronic diastolic CHF (congestive heart failure) (H[facility]) (Chronic)    Clinically compensated, continue Furosemide 40mg  daily.       Peripheral autonomic neuropathy in disorders classified elsewhere    11/15/14 decrease Cellcept to 500mg  daily, then dc in 3 months-toxicity. 03/01/15 continue Cellcept 500mg  daily, f/u Neurology      Benign prostatic hyperplasia    No urinary retention. cotninueTamsulosin 0.8 mg daily.      Constipation    Stable, continue MiraLax daily.      Arthritis    Multiple sites, continue Tylenol 1000mg  daily.          Family/ staff Communication:  SNF for care, fall risk  Labs/tests ordered: none  [facility] Brandan Robicheaux NP [facility] [address], [place] [address] On Call:  [phone] & follow prompts after 5pm & weekends Office Phone:  [phone] Office Fax:  [phone]

## 2015-03-27 NOTE — Assessment & Plan Note (Signed)
No urinary retention. cotninueTamsulosin 0.8 mg daily.  

## 2015-04-05 DIAGNOSIS — N390 Urinary tract infection, site not specified: Secondary | ICD-10-CM | POA: Diagnosis not present

## 2015-04-24 ENCOUNTER — Non-Acute Institutional Stay (SKILLED_NURSING_FACILITY): Payer: Medicare Other | Admitting: Nurse Practitioner

## 2015-04-24 ENCOUNTER — Encounter: Payer: Self-pay | Admitting: Nurse Practitioner

## 2015-04-24 DIAGNOSIS — C182 Malignant neoplasm of ascending colon: Secondary | ICD-10-CM | POA: Diagnosis not present

## 2015-04-24 DIAGNOSIS — N400 Enlarged prostate without lower urinary tract symptoms: Secondary | ICD-10-CM

## 2015-04-24 DIAGNOSIS — D509 Iron deficiency anemia, unspecified: Secondary | ICD-10-CM

## 2015-04-24 DIAGNOSIS — K5900 Constipation, unspecified: Secondary | ICD-10-CM | POA: Diagnosis not present

## 2015-04-24 DIAGNOSIS — G308 Other Alzheimer's disease: Secondary | ICD-10-CM

## 2015-04-24 DIAGNOSIS — E039 Hypothyroidism, unspecified: Secondary | ICD-10-CM | POA: Diagnosis not present

## 2015-04-24 DIAGNOSIS — F0280 Dementia in other diseases classified elsewhere without behavioral disturbance: Secondary | ICD-10-CM

## 2015-04-24 DIAGNOSIS — I5032 Chronic diastolic (congestive) heart failure: Secondary | ICD-10-CM

## 2015-04-24 DIAGNOSIS — G990 Autonomic neuropathy in diseases classified elsewhere: Secondary | ICD-10-CM

## 2015-04-24 DIAGNOSIS — Z8679 Personal history of other diseases of the circulatory system: Secondary | ICD-10-CM

## 2015-04-24 DIAGNOSIS — G909 Disorder of the autonomic nervous system, unspecified: Secondary | ICD-10-CM

## 2015-04-24 DIAGNOSIS — I10 Essential (primary) hypertension: Secondary | ICD-10-CM

## 2015-04-24 NOTE — Progress Notes (Signed)
Patient ID: Jesse Burton, male   DOB: [birth year], 82 y.o.   MRN: [id number]  Location:  [facility] Room Number: 23 Place of Service:  SNF (31) Provider: Lennie Odor Kendalyn Cranfield NP  GREEN, [name], MD  Patient Care Team: Estill Dooms, MD as PCP - General (Internal Medicine) Zebedee Iba, MD as Referring Physician (Ophthalmology) Darlin Coco, MD as Consulting Physician (Cardiology) Garvin Fila, MD as Consulting Physician (Neurology) Festus Aloe, MD as Consulting Physician (Urology) Marilynne Halsted, MD as Referring Physician (Ophthalmology) Lavonna Monarch, MD as Consulting Physician (Dermatology) Gerarda Fraction, MD as Referring Physician (Ophthalmology)  Extended Emergency Contact Information Primary Emergency Contact: Farr,Laura Address: [address] [place] [address] Johnnette Litter of [place] Phone: [phone] Mobile Phone: [phone] Relation: Daughter Secondary Emergency Contact: Genia Plants [place] of [place] Phone: [phone] Relation: Daughter  Code Status:  DNR Goals of care: Advanced Directive information Advanced Directives 04/24/2015  Does patient have an advance directive? No  Would patient like information on creating an advanced directive? -     Chief Complaint  Patient presents with  . Medical Management of Chronic Issues    Routine Visit    HPI:  Pt is a 82 y.o. male seen today for medical management of chronic diseases.  Hx of anemia, s/p partial colectomy.  CAD has been stable, no angina since last visit, hypothyroidism has been supplemented well with last TSH 4.6 01/31/15, HTN is controlled, his memory has been gradual decline but functioning well in SNF, off [facility], peripheral neuropathy has been stable, he walks with walker for short distance and w/c to go further.     Past Medical History  Diagnosis Date  . History of atrial fibrillation   . Thyroid disease   . Memory loss   .  Stroke ([place])   . 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 Transient cerebral ischemia 07/04/2012    a posterior circulation TIA in October 2009.    Marland Kitchen HTN (hypertension) 12/01/2013  . CAD (coronary artery disease) of artery bypass graft 12/02/2013  . Hypothyroidism 12/01/2013  . Peripheral autonomic neuropathy in disorders classified elsewhere(337.1) 07/04/2012  . 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 Hereditary and idiopathic peripheral neuropathy 07/04/2012    a history of chronic sensory polyneuropathy of undetermined origin with abnormality of gait. He is walking with a rolling walker. Last MRA of the head January 2013 shows no significant stenosis, MRA of the neck shows mild stenosis of the proximal left internal carotid artery.    . Abnormality of gait 07/04/2012  . Dizziness and giddiness 07/04/2012  . Benign prostatic hyperplasia   . Edema   . Chorioretinitis   . Panuveitis   . Internal carotid artery stenosis     left  . Mild left ventricular hypertrophy   . Actinic keratoses   . Diverticulitis   . Prostatitis, acute     1991  . Arthritis   . Neuromuscular disorder ([place])   . Essential hypertension   . Hypothyroidism   . BPH (benign prostatic hyperplasia)   . Retinitis   . HLD (hyperlipidemia)   . CAD (coronary artery disease) of bypass graft     a. s/p CABG 1991 without subsequent interventions.  . Chronic diastolic CHF (congestive heart failure) ([place])   . Hyperlipidemia   . Dementia   . Colon cancer [facility])     a. [facility]14 with profound  iron deficiency anemia Hgb 4.5, melena, with new diagnosis of invasive adenocarcinoma of colon s/p lap R colectomy on 12/15/14.  . Iron deficiency anemia     a. 11/2014 - Hgb 4.5 in setting of newly dx colon CA.   Marland Kitchen PAF (paroxysmal atrial fibrillation) ([place])     a. Not on anticoag due to bleeding and fall risk.   Past Surgical History  Procedure Laterality Date  . Cardiac catheterization    . Coronary  artery bypass graft  1991    grafts to LAD and RCA  . Appendectomy    . Coronary artery bypass graft  1991  . Colonoscopy with propofol N/A 12/12/2014    Procedure: COLONOSCOPY WITH PROPOFOL;  Surgeon: Gatha Mayer, MD;  Location: [facility];  Service: Endoscopy;  Laterality: N/A;  . Colon resection N/A 12/15/2014    Procedure: Laparoscopic partial colectomy;  Surgeon: Autumn Messing III, MD;  Location: [facility] ORS;  Service: General;  Laterality: N/A;    Allergies  Allergen Reactions  . Sulfa Antibiotics   . Sulfa Antibiotics Other (See Comments)    Patient states he does not recall an allergy to sulfa  . Ativan [Lorazepam] Anxiety      Medication List       This list is accurate as of: 04/24/15 12:45 PM.  Always use your most recent med list.               acetaminophen 325 MG tablet  Commonly known as:  TYLENOL  Take 650 mg by mouth every 6 (six) hours as needed (for mild pain.).     albuterol (2.5 MG/3ML) 0.083% nebulizer solution  Commonly known as:  PROVENTIL  Take 2.5 mg by nebulization every 6 (six) hours as needed for wheezing or shortness of breath.     amiodarone 200 MG tablet  Commonly known as:  PACERONE  Take 1 tablet (200 mg total) by mouth daily.     atorvastatin 40 MG tablet  Commonly known as:  LIPITOR  Take 40 mg by mouth daily.     carvedilol 3.125 MG tablet  Commonly known as:  COREG  Take 1 tablet (3.125 mg total) by mouth 2 (two) times daily with a meal.     erythromycin ophthalmic ointment  Place 1 application into the right eye at bedtime.     feeding supplement Liqd  Take 1 Container by mouth 2 (two) times daily between meals. 127MLs     ferrous sulfate 325 (65 FE) MG tablet  Take 325 mg by mouth daily with breakfast.     furosemide 20 MG tablet  Commonly known as:  LASIX  Take 40 mg by mouth daily.     Glucosamine-Chondroitin 750-600 MG Tabs  Take 2 tablets by mouth daily.     l-methylfolate-B6-B12 3-35-2 MG Tabs tablet  Commonly  known as:  METANX  Take 1 tablet by mouth daily.     levothyroxine 88 MCG tablet  Commonly known as:  SYNTHROID, LEVOTHROID  Take 88 mcg by mouth daily before breakfast.     multivitamin with minerals Tabs tablet  Take 1 tablet by mouth daily. CERTA-VITE SENIOR     mycophenolate 500 MG tablet  Commonly known as:  CELLCEPT  Take 500 mg by mouth daily.     polyethylene glycol packet  Commonly known as:  MIRALAX / GLYCOLAX  Take 17 g by mouth daily.     potassium chloride SA 20 MEQ tablet  Commonly known as:  K-DUR,KLOR-CON  Take 1 tablet (20 mEq total) by mouth daily.     tamsulosin 0.4 MG Caps capsule  Commonly known as:  FLOMAX  Take 0.8 mg by mouth daily.        Review of Systems  Constitutional: Negative for fever, chills and diaphoresis.  HENT: Positive for hearing loss. Negative for congestion and ear pain.   Eyes: Negative for pain, discharge and redness.  Respiratory: Negative for cough and shortness of breath.   Cardiovascular: Negative for chest pain, palpitations and leg swelling.  Gastrointestinal: Positive for constipation. Negative for nausea, vomiting, abdominal pain and diarrhea.  Genitourinary: Positive for frequency. Negative for dysuria and urgency.  Musculoskeletal: Positive for back pain. Negative for myalgias and neck pain.       W/c for mobility.   Skin: Negative for rash.       abd laparoscopic surgical scars. R hand skin tear  Neurological: Negative for dizziness, tremors, seizures, weakness and headaches.       Peripheral neuropathy  Psychiatric/Behavioral: Negative for suicidal ideas and hallucinations. The patient is not nervous/anxious.        Mood has been stabilized.     Immunization History  Administered Date(s) Administered  . Influenza-Unspecified 12/22/2013, 11/09/2014  . PPD Test 01/03/2015  . Pneumococcal Polysaccharide-23 12/02/1993  . Tdap 01/08/2013  . Zoster 12/03/2007   Pertinent  Health Maintenance Due  Topic Date Due    . PNA vac Low Risk Adult (2 of 2 - PCV13) 12/03/1994  . INFLUENZA VACCINE  09/19/2015   Fall Risk  02/10/2015 06/27/2014  Falls in the past year? Yes No  Number falls in past yr: 1 -  Injury with Fall? No -  Risk for fall due to : History of fall(s) Impaired balance/gait  Follow up Falls evaluation completed -   Functional Status Survey:    Filed Vitals:   04/24/15 0900  BP: 142/64  Pulse: 72  Temp: 98 F (36.7 C)  TempSrc: Oral  Resp: 20  Height: 6' (1.829 m)  Weight: 187 lb 8 oz (85.049 kg)   Body mass index is 25.42 kg/(m^2). Physical Exam  Constitutional: He appears well-developed and well-nourished. No distress.  HENT:  Head: Normocephalic and atraumatic.  Right Ear: External ear normal.  Left Ear: External ear normal.  Nose: Nose normal.  Mouth/Throat: Oropharynx is clear and moist. No oropharyngeal exudate.  Eyes: Conjunctivae and EOM are normal. Pupils are equal, round, and reactive to light. Right eye exhibits no discharge. Left eye exhibits no discharge. No scleral icterus.     Neck: Normal range of motion. Neck supple. No JVD present. No tracheal deviation present. No thyromegaly present.  Cardiovascular: Normal rate and regular rhythm.  Exam reveals no gallop and no friction rub.   Murmur heard. Systolic murmur [id number] right sternal border   Pulmonary/Chest: Effort normal and breath sounds normal. No stridor. No respiratory distress. He has no wheezes. He has no rales. He exhibits no tenderness.  Abdominal: Soft. Bowel sounds are normal. He exhibits no distension and no mass. There is no tenderness.  Musculoskeletal: Normal range of motion. He exhibits no edema.  Neurological: He is alert. He has normal reflexes. No cranial nerve deficit. He exhibits normal muscle tone. Coordination normal.  Skin: Skin is warm and dry. No rash noted. He is not diaphoretic. No erythema. No pallor.  Small skin tear to the right hand.   Psychiatric: He has a normal mood and  affect. Thought content normal. Cognition and memory are impaired.  He exhibits abnormal recent memory. He exhibits normal remote memory.  Memory deficits.     Labs reviewed:  Recent Labs  12/19/14 0539 12/20/14 0510 12/21/14 0500 12/27/14 01/03/15 01/31/15  NA 139 137 136 138 136* 139  K 3.8 3.1* 3.2* 4.3 4.0 4.2  CL 104 102 104  --   --   --   CO2 27 28 26   --   --   --   GLUCOSE 109* 97 98  --   --   --   BUN 21* 27* 22* 15 14 20   CREATININE 1.04 1.16 1.11 1.2 1.0 1.2  CALCIUM 8.9 8.4* 8.4*  --   --   --   MG  --  1.9  --   --   --   --     Recent Labs  05/27/14 12/06/14 12/07/14 0144  AST 18 16 21   ALT 15 14 20   ALKPHOS 60 66 79  BILITOT  --   --  0.9  PROT  --   --  5.8*  ALBUMIN  --   --  3.6    Recent Labs  12/07/14 0144  12/19/14 0539 12/20/14 0510 12/21/14 0500 12/27/14 01/03/15 01/31/15  WBC 4.3  < > 7.5 6.2 8.2 4.2 4.2 4.4  NEUTROABS 2.7  --   --   --   --   --   --   --   HGB 4.9*  < > 10.0* 9.3* 8.8* 8.3* 7.8* 10.2*  HCT 16.5*  < > 33.0* 30.1* 28.1* 27* 26* 33*  MCV 77.5*  < > 80.3 79.4 77.8*  --   --   --   PLT 273  < > 342 369 336 321 314 257  < > = values in this interval not displayed. Lab Results  Component Value Date   TSH 4.60 01/31/2015   Lab Results  Component Value Date   HGBA1C 5.9* 12/15/2014   Lab Results  Component Value Date   CHOL 93 12/07/2014   HDL 36* 12/07/2014   LDLCALC 47 12/07/2014   TRIG 51 12/07/2014   CHOLHDL 2.6 12/07/2014    Significant Diagnostic Results in last 30 days:  No results found.  Assessment/Plan  History of atrial fibrillation Heart rate is in control, continue Amiodarone 200mg  daily.   Alzheimer's disease Off [facility], SNF for care needs, baseline confusion.  Hypothyroidism Continue Levothyroxine 62mcg daily, 01/31/15 TSH 4.6, update TSH  Essential hypertension Controlled, continue Coreg 3.125mg  bid, Atorvastatin 40mg  daily for risk reduction.   Anemia, iron deficiency 12/06/14 Hgb  4.5, MCV76.8, MCH 22.2, RDW 16.2 12/27/14 Hgb 8.3, s/p transfusion, s/p colectomy for colon sarcoma.  12/28/14 added Iron 325mg  daily, CBC/BMP 01/03/15 01/31/15 Hgb 10.2, continue Fe 325mg  daily.  Update CBC  Colon cancer ([place]) of the transverse colon 12/27/14 s/p partial colectomy 12/15/14 by Dr. Marlou Starks, staple removed.  Chronic diastolic CHF (congestive heart failure) (H[facility]) Clinically compensated, continue Furosemide 40mg  daily. Update CMP  Peripheral autonomic neuropathy in disorders classified elsewhere 11/15/14 decrease Cellcept to 500mg  daily, then dc in 3 months-toxicity. 03/01/15 continue Cellcept 500mg  daily, f/u Neurology  Benign prostatic hyperplasia No urinary retention. cotninueTamsulosin 0.8 mg daily.  Constipation Stable, continue MiraLax daily.    Family/ staff Communication: continue SNF for care needs.   Labs/tests ordered: CBC, CMP, TSH

## 2015-04-24 NOTE — Assessment & Plan Note (Signed)
Heart rate is in control, continue Amiodarone 200mg daily.  

## 2015-04-24 NOTE — Assessment & Plan Note (Signed)
12/27/14 s/p partial colectomy 12/15/14 by Dr. Toth, staple removed.   

## 2015-04-24 NOTE — Assessment & Plan Note (Signed)
Clinically compensated, continue Furosemide 40mg daily. Update CMP 

## 2015-04-24 NOTE — Assessment & Plan Note (Signed)
Controlled, continue Coreg 3.125mg bid, Atorvastatin 40mg daily for risk reduction.    

## 2015-04-24 NOTE — Assessment & Plan Note (Signed)
11/15/14 decrease Cellcept to 500mg  daily, then dc in 3 months-toxicity. 03/01/15 continue Cellcept 500mg  daily, f/u Neurology

## 2015-04-24 NOTE — Assessment & Plan Note (Signed)
Stable, continue MiraLax daily.  

## 2015-04-24 NOTE — Assessment & Plan Note (Signed)
Continue Levothyroxine 48mcg daily, 01/31/15 TSH 4.6, update TSH

## 2015-04-24 NOTE — Assessment & Plan Note (Signed)
12/06/14 Hgb 4.5, MCV76.8, MCH 22.2, RDW 16.2 12/27/14 Hgb 8.3, s/p transfusion, s/p colectomy for colon sarcoma.  12/28/14 added Iron 325mg  daily, CBC/BMP 01/03/15 01/31/15 Hgb 10.2, continue Fe 325mg  daily.  Update CBC

## 2015-04-24 NOTE — Assessment & Plan Note (Signed)
Off Namenda, SNF for care needs, baseline confusion.  

## 2015-04-24 NOTE — Assessment & Plan Note (Signed)
No urinary retention. cotninueTamsulosin 0.8 mg daily.  

## 2015-04-25 DIAGNOSIS — I10 Essential (primary) hypertension: Secondary | ICD-10-CM | POA: Diagnosis not present

## 2015-04-25 DIAGNOSIS — E039 Hypothyroidism, unspecified: Secondary | ICD-10-CM | POA: Diagnosis not present

## 2015-04-25 DIAGNOSIS — Z8679 Personal history of other diseases of the circulatory system: Secondary | ICD-10-CM | POA: Diagnosis not present

## 2015-04-25 LAB — BASIC METABOLIC PANEL
BUN: 23 mg/dL — AB (ref 4–21)
CREATININE: 1.4 mg/dL — AB (ref 0.6–1.3)
GLUCOSE: 84 mg/dL
POTASSIUM: 3.7 mmol/L (ref 3.4–5.3)
Sodium: 143 mmol/L (ref 137–147)

## 2015-04-25 LAB — HEPATIC FUNCTION PANEL
ALT: 13 U/L (ref 10–40)
AST: 18 U/L (ref 14–40)
Alkaline Phosphatase: 70 U/L (ref 25–125)
Bilirubin, Total: 1.2 mg/dL

## 2015-04-25 LAB — CBC AND DIFFERENTIAL
HCT: 34 % — AB (ref 41–53)
Hemoglobin: 11 g/dL — AB (ref 13.5–17.5)
PLATELETS: 173 10*3/uL (ref 150–399)
WBC: 4.2 10*3/mL

## 2015-04-25 LAB — TSH: TSH: 2.09 u[IU]/mL (ref 0.41–5.90)

## 2015-05-08 ENCOUNTER — Ambulatory Visit: Payer: Medicare Other | Admitting: Cardiovascular Disease

## 2015-05-19 ENCOUNTER — Encounter: Payer: Self-pay | Admitting: Cardiovascular Disease

## 2015-05-19 ENCOUNTER — Ambulatory Visit (INDEPENDENT_AMBULATORY_CARE_PROVIDER_SITE_OTHER): Payer: Medicare Other | Admitting: Cardiovascular Disease

## 2015-05-19 VITALS — BP 90/50 | HR 50 | Ht 72.0 in | Wt 189.0 lb

## 2015-05-19 DIAGNOSIS — I2510 Atherosclerotic heart disease of native coronary artery without angina pectoris: Secondary | ICD-10-CM | POA: Diagnosis not present

## 2015-05-19 DIAGNOSIS — Z79899 Other long term (current) drug therapy: Secondary | ICD-10-CM

## 2015-05-19 LAB — BASIC METABOLIC PANEL
BUN: 25 mg/dL (ref 7–25)
CHLORIDE: 104 mmol/L (ref 98–110)
CO2: 29 mmol/L (ref 20–31)
CREATININE: 1.4 mg/dL — AB (ref 0.70–1.11)
Calcium: 9.1 mg/dL (ref 8.6–10.3)
Glucose, Bld: 123 mg/dL — ABNORMAL HIGH (ref 65–99)
Potassium: 4.3 mmol/L (ref 3.5–5.3)
Sodium: 141 mmol/L (ref 135–146)

## 2015-05-19 LAB — CBC WITH DIFFERENTIAL/PLATELET
BASOS ABS: 0 10*3/uL (ref 0.0–0.1)
Basophils Relative: 1 % (ref 0–1)
EOS PCT: 1 % (ref 0–5)
Eosinophils Absolute: 0 10*3/uL (ref 0.0–0.7)
HEMATOCRIT: 37.6 % — AB (ref 39.0–52.0)
Hemoglobin: 12.3 g/dL — ABNORMAL LOW (ref 13.0–17.0)
LYMPHS ABS: 1.2 10*3/uL (ref 0.7–4.0)
LYMPHS PCT: 29 % (ref 12–46)
MCH: 30.8 pg (ref 26.0–34.0)
MCHC: 32.7 g/dL (ref 30.0–36.0)
MCV: 94 fL (ref 78.0–100.0)
MPV: 9.4 fL (ref 8.6–12.4)
Monocytes Absolute: 0.4 10*3/uL (ref 0.1–1.0)
Monocytes Relative: 9 % (ref 3–12)
Neutro Abs: 2.5 10*3/uL (ref 1.7–7.7)
Neutrophils Relative %: 60 % (ref 43–77)
Platelets: 189 10*3/uL (ref 150–400)
RBC: 4 MIL/uL — ABNORMAL LOW (ref 4.22–5.81)
RDW: 15.1 % (ref 11.5–15.5)
WBC: 4.1 10*3/uL (ref 4.0–10.5)

## 2015-05-19 NOTE — Patient Instructions (Addendum)
Medication Instructions:  Your physician recommends that you continue on your current medications as directed. Please refer to the Current Medication list given to you today.  Labwork: Your physician recommends that you have lab work today- CBC and BMET  Testing/Procedures: NONE  Follow-Up: Your physician wants you to follow-up in: 6 months with Dr. Johnsie Cancel. You will receive a reminder letter in the mail two months in advance. If you don't receive a letter, please call our office to schedule the follow-up appointment.  If you need a refill on your cardiac medications before your next appointment, please call your pharmacy.

## 2015-05-19 NOTE — Progress Notes (Signed)
Patient ID: Jesse Burton, male   DOB: May 18, 1925, 82 y.o.   MRN: [id number]    Cardiology Office Note Date:  05/19/2015  Patient ID:  Jesse Burton, DOB 02-12-1926, MRN [id number] PCP:  Estill Dooms, MD  Cardiologist:  Dr. Johnsie Cancel  Chief Complaint:  Afib   History of Present Illness: Jesse Burton is a 82 y.o. male with history of CAD (s/p CABG 1991 without subsequent interventions), chronic diastolic CHF, arthritis, retinitis, HTN, HLD, hypothyroidism, BPH, dementia, recently diagnosed colon CA with iron deficiency anemia requiring PRBCs who presents for follow-up. He was admitted to [facility] 12/07/14 with profound iron deficiency anemia Hgb 4.5, melena, with new diagnosis of invasive adenocarcinoma of colon s/p lap R colectomy on 12/15/14. Per notes, oncology was consulted with recs of no role in systemic chemo or radiation therapy. During his admission he initially had NSR/PAF per notes but towards the end of his stay, he had persistent AF. Rates were controlled with carvedilol and amiodarone. Plavix was discontinued (on this since CABG) and he was not felt to be a candidate for anticoagulation for AF given anemia and fall risk.   He comes in today for follow-up accompanied by his daughter We talked about advanced directives and I told her I thought he should be DNR  Her sister is health care power of attorney.  He has advanced dementia and limited mobility. No CP, palpitations, edema, weight gain. Appetite good. Has f/u coming up with surgery and oncology. He has since followed up with PCP at [facility] and Hgb was 8.3, K 4.3, Cr 1.2 (Cr similar to recent values in hospital).    Past Medical History  Diagnosis Date  . History of atrial fibrillation   . Thyroid disease   . Memory loss   . Stroke ([place])   . 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 Transient cerebral ischemia 07/04/2012    a posterior circulation TIA in October 2009.    Marland Kitchen HTN (hypertension) 12/01/2013  . CAD  (coronary artery disease) of artery bypass graft 12/02/2013  . Hypothyroidism 12/01/2013  . Peripheral autonomic neuropathy in disorders classified elsewhere(337.1) 07/04/2012  . 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 Hereditary and idiopathic peripheral neuropathy 07/04/2012    a history of chronic sensory polyneuropathy of undetermined origin with abnormality of gait. He is walking with a rolling walker. Last MRA of the head January 2013 shows no significant stenosis, MRA of the neck shows mild stenosis of the proximal left internal carotid artery.    . Abnormality of gait 07/04/2012  . Dizziness and giddiness 07/04/2012  . Benign prostatic hyperplasia   . Edema   . Chorioretinitis   . Panuveitis   . Internal carotid artery stenosis     left  . Mild left ventricular hypertrophy   . Actinic keratoses   . Diverticulitis   . Prostatitis, acute     1991  . Arthritis   . Neuromuscular disorder ([place])   . Essential hypertension   . Hypothyroidism   . BPH (benign prostatic hyperplasia)   . Retinitis   . HLD (hyperlipidemia)   . CAD (coronary artery disease) of bypass graft     a. s/p CABG 1991 without subsequent interventions.  . Chronic diastolic CHF (congestive heart failure) ([place])   . Hyperlipidemia   . Dementia   . Colon cancer ([place])     a. admitted to [facility] 12/07/14 with profound iron deficiency anemia Hgb 4.5, melena, with new diagnosis of invasive  adenocarcinoma of colon s/p lap R colectomy on 12/15/14.  . Iron deficiency anemia     a. 11/2014 - Hgb 4.5 in setting of newly dx colon CA.   Marland Kitchen PAF (paroxysmal atrial fibrillation) ([place])     a. Not on anticoag due to bleeding and fall risk.    Past Surgical History  Procedure Laterality Date  . Cardiac catheterization    . Coronary artery bypass graft  1991    grafts to LAD and RCA  . Appendectomy    . Coronary artery bypass graft  1991  . Colonoscopy with propofol N/A 12/12/2014    Procedure: COLONOSCOPY WITH  PROPOFOL;  Surgeon: Gatha Mayer, MD;  Location[facility];  Service: Endoscopy;  Laterality: N/A;  . Colon resection N/A 12/15/2014    Procedure: Laparoscopic partial colectomy;  Surgeon: Autumn Messing III, MD;  Location: [facility]S;  Service: General;  Laterality: N/A;    Current Outpatient Prescriptions  Medication Sig Dispense Refill  . acetaminophen (TYLENOL) 325 MG tablet Take 650 mg by mouth daily.    Marland Kitchen acetaminophen (TYLENOL) 325 MG tablet Take 650 mg by mouth every 6 (six) hours as needed (for mild pain.).    Marland Kitchen amiodarone (PACERONE) 200 MG tablet Take 1 tablet (200 mg total) by mouth daily. 30 tablet 0  . atorvastatin (LIPITOR) 40 MG tablet Take 40 mg by mouth daily.    . carvedilol (COREG) 3.125 MG tablet Take 1 tablet (3.125 mg total) by mouth 2 (two) times daily with a meal. 60 tablet 0  . erythromycin ophthalmic ointment Place 1 application into the right eye at bedtime.    . feeding supplement, ENSURE ENLIVE, (ENSURE ENLIVE) LIQD Take 237 mLs by mouth 2 (two) times daily between meals. 237 mL 12  . ferrous sulfate 325 (65 FE) MG tablet Take 325 mg by mouth daily with breakfast.    . furosemide (LASIX) 40 MG tablet Take 1 tablet (40 mg total) by mouth daily. 30 tablet 0  . Glucosamine-Chondroitin 750-600 MG TABS Take 2 tablets by mouth daily.    Marland Kitchen L-Methylfolate-B12-B6-B2 (CEREFOLIN) 07-19-48-5 MG TABS Take 1 tablet by mouth daily.    Marland Kitchen l-methylfolate-B6-B12 (METANX) 3-35-2 MG TABS tablet Take 1 tablet by mouth daily.    Marland Kitchen levalbuterol (XOPENEX) 0.63 MG/3ML nebulizer solution Take 3 mLs (0.63 mg total) by nebulization every 6 (six) hours as needed for wheezing or shortness of breath. 250 mL 0  . levothyroxine (SYNTHROID, LEVOTHROID) 88 MCG tablet Take 88 mcg by mouth daily before breakfast.    . Multiple Vitamin (MULTIVITAMIN WITH MINERALS) TABS tablet Take 1 tablet by mouth daily. CERTA-VITE SENIOR    . mycophenolate (CELLCEPT) 500 MG tablet Take 500 mg by mouth daily.    .  polyethylene glycol (MIRALAX / GLYCOLAX) packet Take 17 g by mouth daily.    . potassium chloride SA (K-DUR,KLOR-CON) 20 MEQ tablet Take 1 tablet (20 mEq total) by mouth daily. 20 tablet 0  . tamsulosin (FLOMAX) 0.4 MG CAPS capsule Take 0.8 mg by mouth daily.     No current facility-administered medications for this visit.    Allergies:   Sulfa antibiotics and Ativan   Social History:  The patient  reports that he quit smoking about 36 years ago. He does not have any smokeless tobacco history on file. He reports that he does not drink alcohol or use illicit drugs.   Family History:  The patient's family history includes Cancer in his mother and sister; Congestive Heart  Failure in his father; Coronary artery disease in his brother and father; Diabetes in his father, mother, sister, and sister; Heart attack in his father and sister; Heart disease in his sister; Heart failure in his father; Hypertension in his father and mother; Kidney failure in his sister; Pneumonia in his mother; Stroke in his father. There is no history of Heart attack or Hypertension.  ROS:  Please see the history of present illness.  All other systems are reviewed and otherwise negative.   PHYSICAL EXAM:  VS:  BP 90/50 mmHg  Pulse 50  Ht 6' (1.829 m)  Wt 85.73 kg (189 lb)  BMI 25.63 kg/m2  SpO2 94% BMI: Body mass index is 25.63 kg/(m^2). Well developed elderly WM in no acute distress HEENT: normocephalic, atraumatic Neck: no JVD, carotid bruits or masses Cardiac:  normal S1, S2; irregularly irregular; no murmurs, rubs, or gallops Lungs:  clear to auscultation bilaterally, no wheezing, rhonchi or rales Abd: soft, nontender, no hepatomegaly, + BS MS: no deformity or atrophy Ext: no edema Skin: warm and dry, no rash Neuro:  moves all extremities spontaneously, follows commands, mild confusion noted but able to be reoriented - later remembered that the woman with him was his daughter, not his wife Psych: euthymic  mood, jovial affect  EKG:   01/03/15  atrial fib, 61bpm, nonspeciifc TWI III  Recent Labs: 12/17/2014: B Natriuretic Peptide 877.1* 12/20/2014: Magnesium 1.9 04/25/2015: ALT 13; BUN 23*; Creatinine 1.4*; Hemoglobin 11.0*; Platelets 173; Potassium 3.7; Sodium 143; TSH 2.09  12/07/2014: Cholesterol 93; HDL 36*; LDL Cholesterol 47; Total CHOL/HDL Ratio 2.6; Triglycerides 51; VLDL 10   CrCl cannot be calculated (Patient has no serum creatinine result on file.).   Wt Readings from Last 3 Encounters:  05/19/15 85.73 kg (189 lb)  04/24/15 85.049 kg (187 lb 8 oz)  03/27/15 83.553 kg (184 lb 3.2 oz)     Other studies reviewed: Additional studies/records reviewed today include: summarized above  ASSESSMENT AND PLAN:  1. CAD s/p CABG as above - no recent angina reported. Off antiplatelets due to recent severe anemia and bleeding. Continue beta blocker and statin therapy. 2. PAF, now persistent - continue rate control with amiodarone and carvedilol. He is asymptomatic. Not a candidate for anticoagulation due to recent anemia/bleeding issues. At this point we are using amiodarone more for rate control than rhythm control, given his tendency towards softer BP. Further monitoring of organ systems while on amiodarone will be at discretion of primary cardiologist. 3. Chronic diastolic CHF - appears euvolemic. The nursing facility should follow daily weights and call for weight gain of 3 lbs overnight or 5lbs over the course of several days. He should follow low sodium diet 2000mg  and fluid restrict to <2L per day total.  4. Hypokalemia - improved by recent check by PCP (4.3). 5. Iron deficiency anemia in setting of recently diagnosed colon CA - followed by PCP, surgery, and oncology. He looks pale today will check CBC/PLT and BMET  Disposition: F/U with me in  A year   Current medicines are reviewed at length with the patient today.  The patient did not have any concerns regarding medicines.  [place] Rouge

## 2015-05-22 ENCOUNTER — Telehealth: Payer: Self-pay | Admitting: Cardiovascular Disease

## 2015-05-22 ENCOUNTER — Encounter: Payer: Self-pay | Admitting: Nurse Practitioner

## 2015-05-22 ENCOUNTER — Non-Acute Institutional Stay (SKILLED_NURSING_FACILITY): Payer: Medicare Other | Admitting: Nurse Practitioner

## 2015-05-22 DIAGNOSIS — D509 Iron deficiency anemia, unspecified: Secondary | ICD-10-CM | POA: Diagnosis not present

## 2015-05-22 DIAGNOSIS — C182 Malignant neoplasm of ascending colon: Secondary | ICD-10-CM | POA: Diagnosis not present

## 2015-05-22 DIAGNOSIS — N400 Enlarged prostate without lower urinary tract symptoms: Secondary | ICD-10-CM | POA: Diagnosis not present

## 2015-05-22 DIAGNOSIS — E039 Hypothyroidism, unspecified: Secondary | ICD-10-CM

## 2015-05-22 DIAGNOSIS — Z8679 Personal history of other diseases of the circulatory system: Secondary | ICD-10-CM | POA: Diagnosis not present

## 2015-05-22 DIAGNOSIS — N182 Chronic kidney disease, stage 2 (mild): Secondary | ICD-10-CM

## 2015-05-22 DIAGNOSIS — G619 Inflammatory polyneuropathy, unspecified: Secondary | ICD-10-CM

## 2015-05-22 DIAGNOSIS — R609 Edema, unspecified: Secondary | ICD-10-CM

## 2015-05-22 DIAGNOSIS — M1990 Unspecified osteoarthritis, unspecified site: Secondary | ICD-10-CM

## 2015-05-22 DIAGNOSIS — F0280 Dementia in other diseases classified elsewhere without behavioral disturbance: Secondary | ICD-10-CM

## 2015-05-22 DIAGNOSIS — I10 Essential (primary) hypertension: Secondary | ICD-10-CM | POA: Diagnosis not present

## 2015-05-22 DIAGNOSIS — G909 Disorder of the autonomic nervous system, unspecified: Secondary | ICD-10-CM

## 2015-05-22 DIAGNOSIS — G308 Other Alzheimer's disease: Secondary | ICD-10-CM

## 2015-05-22 DIAGNOSIS — K5900 Constipation, unspecified: Secondary | ICD-10-CM | POA: Diagnosis not present

## 2015-05-22 DIAGNOSIS — I5032 Chronic diastolic (congestive) heart failure: Secondary | ICD-10-CM | POA: Diagnosis not present

## 2015-05-22 DIAGNOSIS — G622 Polyneuropathy due to other toxic agents: Secondary | ICD-10-CM

## 2015-05-22 DIAGNOSIS — G990 Autonomic neuropathy in diseases classified elsewhere: Secondary | ICD-10-CM

## 2015-05-22 NOTE — Assessment & Plan Note (Signed)
No urinary retention. cotninueTamsulosin 0.8 mg daily.  

## 2015-05-22 NOTE — Telephone Encounter (Signed)
Called patient's daughter Henrietta Dine) back about patient's lab results. Per Dr. Johnsie Cancel, labs okay. Patient's daughter verbalized understanding.

## 2015-05-22 NOTE — Assessment & Plan Note (Signed)
Heart rate is in control, continue Amiodarone 200mg daily.  

## 2015-05-22 NOTE — Assessment & Plan Note (Signed)
Controlled, continue Coreg 3.125mg bid, Atorvastatin 40mg daily for risk reduction.    

## 2015-05-22 NOTE — Assessment & Plan Note (Signed)
Stable, continue MiraLax daily.  

## 2015-05-22 NOTE — Assessment & Plan Note (Signed)
11/15/14 decrease Cellcept to 500mg  daily, then dc in 3 months-toxicity. 03/01/15 continue Cellcept 500mg  daily, f/u Neurology

## 2015-05-22 NOTE — Assessment & Plan Note (Signed)
12/06/14 Hgb 4.5, MCV76.8, MCH 22.2, RDW 16.2 12/27/14 Hgb 8.3, s/p transfusion, s/p colectomy for colon sarcoma.  12/28/14 added Iron 325mg  daily, CBC/BMP 01/03/15 01/31/15 Hgb 10.2, continue Fe 325mg  daily.  04/25/15 Hgb 11.0 05/22/15 dc Fe, update CBC in one month.

## 2015-05-22 NOTE — Assessment & Plan Note (Signed)
12/27/14 s/p partial colectomy 12/15/14 by Dr. Toth, staple removed.   

## 2015-05-22 NOTE — Progress Notes (Signed)
Patient ID: Jesse Burton, male   DOB: Jan 29, 1926, 82 y.o.   MRN: [id number]  Location:  [facility] Room Number: 23 Place of Service:  SNF (31) Provider: Lennie Odor Edy Mcbane NP  GREEN, [name], MD  Patient Care Team: Estill Dooms, MD as PCP - General (Internal Medicine) Zebedee Iba, MD as Referring Physician (Ophthalmology) Darlin Coco, MD as Consulting Physician (Cardiology) Garvin Fila, MD as Consulting Physician (Neurology) Festus Aloe, MD as Consulting Physician (Urology) Marilynne Halsted, MD as Referring Physician (Ophthalmology) Lavonna Monarch, MD as Consulting Physician (Dermatology) Gerarda Fraction, MD as Referring Physician (Ophthalmology)  Extended Emergency Contact Information Primary Emergency Contact: Farr,Laura Address: [address] [place] [address] Johnnette Litter of [place] Phone: [phone] Mobile Phone: [phone] Relation: Daughter Secondary Emergency Contact: Genia Plants [place] of [place] Phone: [phone] Relation: Daughter  Code Status:  DNR Goals of care: Advanced Directive information Advanced Directives 05/22/2015  Does patient have an advance directive? No  Would patient like information on creating an advanced directive? -     Chief Complaint  Patient presents with  . Medical Management of Chronic Issues    Routine visit    HPI:  Pt is a 82 y.o. male seen today for medical management of chronic diseases.  Hx of anemia, s/p partial colectomy.  CAD has been stable, no angina since last visit, hypothyroidism has been supplemented well with last TSH 4.6 01/31/15, HTN is controlled, his memory has been gradual decline but functioning well in SNF, [facility], peripheral neuropathy has been stable, he walks with walker for short distance and w/c to go further.     Past Medical History  Diagnosis Date  . History of atrial fibrillation   . Thyroid disease   . Memory loss   .  Stroke ([place])   . 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 Transient cerebral ischemia 07/04/2012    a posterior circulation TIA in October 2009.    Marland Kitchen HTN (hypertension) 12/01/2013  . CAD (coronary artery disease) of artery bypass graft 12/02/2013  . Hypothyroidism 12/01/2013  . Peripheral autonomic neuropathy in disorders classified elsewhere(337.1) 07/04/2012  . 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 Hereditary and idiopathic peripheral neuropathy 07/04/2012    a history of chronic sensory polyneuropathy of undetermined origin with abnormality of gait. He is walking with a rolling walker. Last MRA of the head January 2013 shows no significant stenosis, MRA of the neck shows mild stenosis of the proximal left internal carotid artery.    . Abnormality of gait 07/04/2012  . Dizziness and giddiness 07/04/2012  . Benign prostatic hyperplasia   . Edema   . Chorioretinitis   . Panuveitis   . Internal carotid artery stenosis     left  . Mild left ventricular hypertrophy   . Actinic keratoses   . Diverticulitis   . Prostatitis, acute     1991  . Arthritis   . Neuromuscular disorder ([place])   . Essential hypertension   . Hypothyroidism   . BPH (benign prostatic hyperplasia)   . Retinitis   . HLD (hyperlipidemia)   . CAD (coronary artery disease) of bypass graft     a. s/p CABG 1991 without subsequent interventions.  . Chronic diastolic CHF (congestive heart failure) ([place])   . Hyperlipidemia   . Dementia   . Colon cancer [facility])     a. admitted to [facility] 12/07/14 with profound  iron deficiency anemia Hgb 4.5, melena, with new diagnosis of invasive adenocarcinoma of colon s/p lap R colectomy on 12/15/14.  . Iron deficiency anemia     a. 11/2014 - Hgb 4.5 in setting of newly dx colon CA.   Marland Kitchen PAF (paroxysmal atrial fibrillation) ([place])     a. Not on anticoag due to bleeding and fall risk.   Past Surgical History  Procedure Laterality Date  . Cardiac catheterization    . Coronary  artery bypass graft  1991    grafts to LAD and RCA  . Appendectomy    . Coronary artery bypass graft  1991  . Colonoscopy with propofol N/A 12/12/2014    Procedure: COLONOSCOPY WITH PROPOFOL;  Surgeon: Gatha Mayer, MD;  Location: [facility] ENDOSCOPY;  Service: Endoscopy;  Laterality: N/A;  . Colon resection N/A 12/15/2014    Procedure: Laparoscopic partial colectomy;  Surgeon: Autumn Messing III, MD;  Location: [facility] ORS;  Service: General;  Laterality: N/A;    Allergies  Allergen Reactions  . Sulfa Antibiotics Other (See Comments)    unknown  . Ativan [Lorazepam] Anxiety      Medication List       This list is accurate as of: 05/22/15  1:36 PM.  Always use your most recent med list.               acetaminophen 325 MG tablet  Commonly known as:  TYLENOL  Take 650 mg by mouth every 6 (six) hours as needed (for mild pain.).     albuterol (2.5 MG/3ML) 0.083% nebulizer solution  Commonly known as:  PROVENTIL  Take 2.5 mg by nebulization every 6 (six) hours as needed for wheezing or shortness of breath.     amiodarone 200 MG tablet  Commonly known as:  PACERONE  Take 1 tablet (200 mg total) by mouth daily.     atorvastatin 40 MG tablet  Commonly known as:  LIPITOR  Take 40 mg by mouth daily.     carvedilol 3.125 MG tablet  Commonly known as:  COREG  Take 1 tablet (3.125 mg total) by mouth 2 (two) times daily with a meal.     CEREFOLIN 07-19-48-5 MG Tabs  Take 1 tablet by mouth daily.     erythromycin ophthalmic ointment  Place 1 application into the right eye at bedtime.     feeding supplement Liqd  Take 1 Container by mouth 2 (two) times daily between meals. 127MLs     ferrous sulfate 325 (65 FE) MG tablet  Take 325 mg by mouth daily with breakfast.     furosemide 20 MG tablet  Commonly known as:  LASIX  Take 40 mg by mouth daily.     Glucosamine-Chondroitin 750-600 MG Tabs  Take 2 tablets by mouth daily.     l-methylfolate-B6-B12 3-35-2 MG Tabs tablet  Commonly known  as:  METANX  Take 1 tablet by mouth daily.     levothyroxine 88 MCG tablet  Commonly known as:  SYNTHROID, LEVOTHROID  Take 88 mcg by mouth daily before breakfast.     multivitamin with minerals Tabs tablet  Take 1 tablet by mouth daily. CERTA-VITE SENIOR     mycophenolate 500 MG tablet  Commonly known as:  CELLCEPT  Take 500 mg by mouth daily.     polyethylene glycol packet  Commonly known as:  MIRALAX / GLYCOLAX  Take 17 g by mouth daily.     potassium chloride SA 20 MEQ tablet  Commonly known as:  K-DUR,KLOR-CON  Take 1 tablet (20 mEq total) by mouth daily.     tamsulosin 0.4 MG Caps capsule  Commonly known as:  FLOMAX  Take 0.8 mg by mouth daily.        Review of Systems  Constitutional: Negative for fever, chills and diaphoresis.  HENT: Positive for hearing loss. Negative for congestion and ear pain.   Eyes: Negative for pain, discharge and redness.  Respiratory: Negative for cough and shortness of breath.   Cardiovascular: Negative for chest pain, palpitations and leg swelling.  Gastrointestinal: Positive for constipation. Negative for nausea, vomiting, abdominal pain and diarrhea.  Genitourinary: Positive for frequency. Negative for dysuria and urgency.  Musculoskeletal: Positive for back pain. Negative for myalgias and neck pain.       W/c for mobility.   Skin: Negative for rash.       abd laparoscopic surgical scars. R hand skin tear  Neurological: Negative for dizziness, tremors, seizures, weakness and headaches.       Peripheral neuropathy  Psychiatric/Behavioral: Negative for suicidal ideas and hallucinations. The patient is not nervous/anxious.        Mood has been stabilized.     Immunization History  Administered Date(s) Administered  . Influenza-Unspecified 12/22/2013, 11/09/2014  . PPD Test 01/03/2015  . Pneumococcal Polysaccharide-23 12/02/1993  . Tdap 01/08/2013  . Zoster 12/03/2007   Pertinent  Health Maintenance Due  Topic Date Due  . PNA  vac Low Risk Adult (2 of 2 - PCV13) 12/03/1994  . INFLUENZA VACCINE  09/19/2015   Fall Risk  02/10/2015 06/27/2014  Falls in the past year? Yes No  Number falls in past yr: 1 -  Injury with Fall? No -  Risk for fall due to : History of fall(s) Impaired balance/gait  Follow up Falls evaluation completed -   Functional Status Survey:    Filed Vitals:   05/22/15 1143  BP: 156/78  Pulse: 80  Temp: 98.5 F (36.9 C)  TempSrc: Oral  Resp: 20  Height: 6' (1.829 m)  Weight: 190 lb 14.4 oz (86.592 kg)   Body mass index is 25.89 kg/(m^2). Physical Exam  Constitutional: He appears well-developed and well-nourished. No distress.  HENT:  Head: Normocephalic and atraumatic.  Right Ear: External ear normal.  Left Ear: External ear normal.  Nose: Nose normal.  Mouth/Throat: Oropharynx is clear and moist. No oropharyngeal exudate.  Eyes: Conjunctivae and EOM are normal. Pupils are equal, round, and reactive to light. Right eye exhibits no discharge. Left eye exhibits no discharge. No scleral icterus.     Neck: Normal range of motion. Neck supple. No JVD present. No tracheal deviation present. No thyromegaly present.  Cardiovascular: Normal rate and regular rhythm.  Exam reveals no gallop and no friction rub.   Murmur heard. Systolic murmur [id number] right sternal border   Pulmonary/Chest: Effort normal and breath sounds normal. No stridor. No respiratory distress. He has no wheezes. He has no rales. He exhibits no tenderness.  Abdominal: Soft. Bowel sounds are normal. He exhibits no distension and no mass. There is no tenderness.  Musculoskeletal: Normal range of motion. He exhibits no edema.  Neurological: He is alert. He has normal reflexes. No cranial nerve deficit. He exhibits normal muscle tone. Coordination normal.  Skin: Skin is warm and dry. No rash noted. He is not diaphoretic. No erythema. No pallor.  Small skin tear to the right hand.   Psychiatric: He has a normal mood and affect.  Thought content normal. Cognition and memory are  impaired. He exhibits abnormal recent memory. He exhibits normal remote memory.  Memory deficits.     Labs reviewed:  Recent Labs  12/20/14 0510 12/21/14 0500  01/31/15 04/25/15 05/19/15 1521  NA 137 136  < > 139 143 141  K 3.1* 3.2*  < > 4.2 3.7 4.3  CL 102 104  --   --   --  104  CO2 28 26  --   --   --  29  GLUCOSE 97 98  --   --   --  123*  BUN 27* 22*  < > 20 23* 25  CREATININE 1.16 1.11  < > 1.2 1.4* 1.40*  CALCIUM 8.4* 8.4*  --   --   --  9.1  MG 1.9  --   --   --   --   --   < > = values in this interval not displayed.  Recent Labs  12/06/14 12/07/14 0144 04/25/15  AST 16 21 18   ALT 14 20 13   ALKPHOS 66 79 70  BILITOT  --  0.9  --   PROT  --  5.8*  --   ALBUMIN  --  3.6  --     Recent Labs  12/07/14 0144  12/20/14 0510 12/21/14 0500  01/31/15 04/25/15 05/19/15 1521  WBC 4.3  < > 6.2 8.2  < > 4.4 4.2 4.1  NEUTROABS 2.7  --   --   --   --   --   --  2.5  HGB 4.9*  < > 9.3* 8.8*  < > 10.2* 11.0* 12.3*  HCT 16.5*  < > 30.1* 28.1*  < > 33* 34* 37.6*  MCV 77.5*  < > 79.4 77.8*  --   --   --  94.0  PLT 273  < > 369 336  < > 257 173 189  < > = values in this interval not displayed. Lab Results  Component Value Date   TSH 2.09 04/25/2015   Lab Results  Component Value Date   HGBA1C 5.9* 12/15/2014   Lab Results  Component Value Date   CHOL 93 12/07/2014   HDL 36* 12/07/2014   LDLCALC 47 12/07/2014   TRIG 51 12/07/2014   CHOLHDL 2.6 12/07/2014    Significant Diagnostic Results in last 30 days:  No results found.  Assessment/Plan  Alzheimer's disease [facility], SNF for care needs, baseline confusion.  Anemia, iron deficiency 12/06/14 Hgb 4.5, MCV76.8, MCH 22.2, RDW 16.2 12/27/14 Hgb 8.3, s/p transfusion, s/p colectomy for colon sarcoma.  12/28/14 added Iron 325mg  daily, CBC/BMP 01/03/15 01/31/15 Hgb 10.2, continue Fe 325mg  daily.  04/25/15 Hgb 11.0 05/22/15 dc Fe, update CBC in one month.    Arthritis Multiple sites, continue Tylenol 1000mg  daily.  Benign prostatic hyperplasia No urinary retention. cotninueTamsulosin 0.8 mg daily.  Chronic diastolic CHF (congestive heart failure) (HCC) Clinically compensated, continue Furosemide 40mg  daily. 04/25/15 Na 143, K 4.3, Bun 23, creat 1.4   Chronic kidney disease 04/25/15 Na 143, K 4.3, Bun 23, creat 1.4  Colon cancer (HCC) of the transverse colon 12/27/14 s/p partial colectomy 12/15/14 by Dr. Marlou Starks, staple removed.  Constipation Stable, continue MiraLax daily.  Edema Trace in ankles, continue Furosemide.   Essential hypertension Controlled, continue Coreg 3.125mg  bid, Atorvastatin 40mg  daily for risk reduction.   History of atrial fibrillation Heart rate is in control, continue Amiodarone 200mg  daily.   Hypothyroidism Continue Levothyroxine 48mcg daily, 01/31/15 TSH 4.6, 04/25/15 TSH 2.09  Inflammatory and toxic  neuropathy 11/15/14 decrease Cellcept to 500mg  daily, then dc in 3 months-toxicity. 03/01/15 continue Cellcept 500mg  daily, f/u Neurology  Peripheral autonomic neuropathy in disorders classified elsewhere 11/15/14 decrease Cellcept to 500mg  daily, then dc in 3 months-toxicity. 03/01/15 continue Cellcept 500mg  daily, f/u Neurology    Family/ staff Communication: continue SNF for care needs.   Labs/tests ordered: CBC

## 2015-05-22 NOTE — Assessment & Plan Note (Signed)
Continue Levothyroxine 35mcg daily, 01/31/15 TSH 4.6, 04/25/15 TSH 2.09

## 2015-05-22 NOTE — Assessment & Plan Note (Signed)
Multiple sites, continue Tylenol 1000mg  daily.

## 2015-05-22 NOTE — Assessment & Plan Note (Signed)
Off Namenda, SNF for care needs, baseline confusion.  

## 2015-05-22 NOTE — Assessment & Plan Note (Signed)
Clinically compensated, continue Furosemide 40mg  daily. 04/25/15 Na 143, K 4.3, Bun 23, creat 1.4

## 2015-05-22 NOTE — Assessment & Plan Note (Signed)
04/25/15 Na 143, K 4.3, Bun 23, creat 1.4

## 2015-05-22 NOTE — Assessment & Plan Note (Signed)
Trace in ankles, continue Furosemide.

## 2015-05-22 NOTE — Telephone Encounter (Signed)
Follow up ° ° °Returning your call. °

## 2015-05-26 DIAGNOSIS — Z7901 Long term (current) use of anticoagulants: Secondary | ICD-10-CM | POA: Diagnosis not present

## 2015-05-26 DIAGNOSIS — Z79899 Other long term (current) drug therapy: Secondary | ICD-10-CM | POA: Diagnosis not present

## 2015-05-26 DIAGNOSIS — I482 Chronic atrial fibrillation: Secondary | ICD-10-CM | POA: Diagnosis not present

## 2015-05-30 ENCOUNTER — Encounter: Payer: Self-pay | Admitting: Cardiovascular Disease

## 2015-05-30 ENCOUNTER — Telehealth: Payer: Self-pay | Admitting: Cardiovascular Disease

## 2015-05-30 NOTE — Telephone Encounter (Signed)
New message    Lab results was fax over last Friday and today this am @ 7 am.  Checking to see if Physician had look at the lab results.

## 2015-05-30 NOTE — Telephone Encounter (Addendum)
Jesse Burton is advised that Dr Johnsie Cancel ordered a CBCD and a BMET on the pt that was drawn here in this office at his last [place] on 05/19/15. Per Dr Johnsie Cancel the [place] and BMET from 3/31 was [facility] and he did not request repeat lab work. She states that they must have misunderstood Dr Mariana Arn orders and drew labs again. Jesse Burton is advised that we will have new lab results scanned into the pts chart for Dr Mariana Arn review and that the results are very similar to the labs drawn on 3/31. She thanked me for calling her back.Marland Kitchen

## 2015-06-06 ENCOUNTER — Encounter: Payer: Self-pay | Admitting: Cardiovascular Disease

## 2015-06-06 DIAGNOSIS — I10 Essential (primary) hypertension: Secondary | ICD-10-CM | POA: Diagnosis not present

## 2015-06-07 NOTE — Telephone Encounter (Signed)
Patient's daughter [facility]) calling for lab results. Results given.

## 2015-06-07 NOTE — Telephone Encounter (Signed)
New Message:  Pt's daughter is returning your call in regards to the pt. Please f/u with her

## 2015-06-09 ENCOUNTER — Encounter: Payer: Self-pay | Admitting: Nurse Practitioner

## 2015-06-09 ENCOUNTER — Non-Acute Institutional Stay (SKILLED_NURSING_FACILITY): Payer: Medicare Other | Admitting: Nurse Practitioner

## 2015-06-09 DIAGNOSIS — E039 Hypothyroidism, unspecified: Secondary | ICD-10-CM | POA: Diagnosis not present

## 2015-06-09 DIAGNOSIS — G619 Inflammatory polyneuropathy, unspecified: Secondary | ICD-10-CM

## 2015-06-09 DIAGNOSIS — R269 Unspecified abnormalities of gait and mobility: Secondary | ICD-10-CM | POA: Diagnosis not present

## 2015-06-09 DIAGNOSIS — I10 Essential (primary) hypertension: Secondary | ICD-10-CM | POA: Diagnosis not present

## 2015-06-09 DIAGNOSIS — Z8679 Personal history of other diseases of the circulatory system: Secondary | ICD-10-CM

## 2015-06-09 DIAGNOSIS — I5032 Chronic diastolic (congestive) heart failure: Secondary | ICD-10-CM | POA: Diagnosis not present

## 2015-06-09 DIAGNOSIS — G308 Other Alzheimer's disease: Secondary | ICD-10-CM | POA: Diagnosis not present

## 2015-06-09 DIAGNOSIS — K5900 Constipation, unspecified: Secondary | ICD-10-CM | POA: Diagnosis not present

## 2015-06-09 DIAGNOSIS — G990 Autonomic neuropathy in diseases classified elsewhere: Secondary | ICD-10-CM

## 2015-06-09 DIAGNOSIS — N400 Enlarged prostate without lower urinary tract symptoms: Secondary | ICD-10-CM | POA: Diagnosis not present

## 2015-06-09 DIAGNOSIS — N182 Chronic kidney disease, stage 2 (mild): Secondary | ICD-10-CM | POA: Diagnosis not present

## 2015-06-09 DIAGNOSIS — D509 Iron deficiency anemia, unspecified: Secondary | ICD-10-CM | POA: Diagnosis not present

## 2015-06-09 DIAGNOSIS — M1990 Unspecified osteoarthritis, unspecified site: Secondary | ICD-10-CM | POA: Diagnosis not present

## 2015-06-09 DIAGNOSIS — G909 Disorder of the autonomic nervous system, unspecified: Secondary | ICD-10-CM

## 2015-06-09 DIAGNOSIS — F0280 Dementia in other diseases classified elsewhere without behavioral disturbance: Secondary | ICD-10-CM

## 2015-06-09 DIAGNOSIS — R609 Edema, unspecified: Secondary | ICD-10-CM | POA: Diagnosis not present

## 2015-06-09 DIAGNOSIS — G622 Polyneuropathy due to other toxic agents: Secondary | ICD-10-CM

## 2015-06-09 NOTE — Assessment & Plan Note (Signed)
Heart rate is in control, continue Amiodarone 200mg daily.  

## 2015-06-09 NOTE — Assessment & Plan Note (Signed)
Controlled, continue Coreg 3.125mg bid, Atorvastatin 40mg daily for risk reduction.    

## 2015-06-09 NOTE — Assessment & Plan Note (Signed)
Ambulates with walker, w/c to go further, c/o the right knee pain with walker, but declined pain meds, no patellar ballottement or crepitus, no apparent swelling or warmth noted. Continue to observe.

## 2015-06-09 NOTE — Assessment & Plan Note (Signed)
05/22/15 dc Fe, 05/27/15 Hgb 12.2, update CBC in one month.

## 2015-06-09 NOTE — Progress Notes (Signed)
Patient ID: Jesse Burton, male   DOB: Dec 03, 1925, 82 y.o.   MRN: [id number]  Location:  [facility] Room Number: 23 Place of Service:  SNF (31) Provider: Lennie Odor Bryley Chrisman NP  GREEN, [name], MD  Patient Care Team: Estill Dooms, MD as PCP - General (Internal Medicine) Zebedee Iba, MD as Referring Physician (Ophthalmology) Darlin Coco, MD as Consulting Physician (Cardiology) Garvin Fila, MD as Consulting Physician (Neurology) Festus Aloe, MD as Consulting Physician (Urology) Marilynne Halsted, MD as Referring Physician (Ophthalmology) Lavonna Monarch, MD as Consulting Physician (Dermatology) Gerarda Fraction, MD as Referring Physician (Ophthalmology)  Extended Emergency Contact Information Primary Emergency Contact: Farr,Laura Address: [address] [place] [address] Johnnette Litter of [place] Phone: [phone] Mobile Phone: [phone] Relation: Daughter Secondary Emergency Contact: Genia Plants [place] of [place] Phone: [phone] Relation: Daughter  Code Status:  DNR Goals of care: Advanced Directive information Advanced Directives 06/09/2015  Does patient have an advance directive? No  Would patient like information on creating an advanced directive? -     Chief Complaint  Patient presents with  . Acute Visit    patient complains of right knee pain he states he does not want pain pill he wants it examined     HPI:  Pt is a 82 y.o. male seen today for medical management of chronic diseases.  Hx of anemia, s/p partial colectomy.  CAD has been stable, no angina since last visit, hypothyroidism has been supplemented well with last TSH 4.6 01/31/15, HTN is controlled, his memory has been gradual decline but functioning well in SNF, off [facility], peripheral neuropathy has been stable, he walks with walker for short distance and w/c to go further.     Past Medical History  Diagnosis Date  . History of atrial  fibrillation   . Thyroid disease   . Memory loss   . Stroke ([place])   . 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 Transient cerebral ischemia 07/04/2012    a posterior circulation TIA in October 2009.    Marland Kitchen HTN (hypertension) 12/01/2013  . CAD (coronary artery disease) of artery bypass graft 12/02/2013  . Hypothyroidism 12/01/2013  . Peripheral autonomic neuropathy in disorders classified elsewhere(337.1) 07/04/2012  . 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 Hereditary and idiopathic peripheral neuropathy 07/04/2012    a history of chronic sensory polyneuropathy of undetermined origin with abnormality of gait. He is walking with a rolling walker. Last MRA of the head January 2013 shows no significant stenosis, MRA of the neck shows mild stenosis of the proximal left internal carotid artery.    . Abnormality of gait 07/04/2012  . Dizziness and giddiness 07/04/2012  . Benign prostatic hyperplasia   . Edema   . Chorioretinitis   . Panuveitis   . Internal carotid artery stenosis     left  . Mild left ventricular hypertrophy   . Actinic keratoses   . Diverticulitis   . Prostatitis, acute     1991  . Arthritis   . Neuromuscular disorder ([place])   . Essential hypertension   . Hypothyroidism   . BPH (benign prostatic hyperplasia)   . Retinitis   . HLD (hyperlipidemia)   . CAD (coronary artery disease) of bypass graft     a. s/p CABG 1991 without subsequent interventions.  . Chronic diastolic CHF (congestive heart failure) ([place])   . Hyperlipidemia   . Dementia   .  Colon cancer ([place])     a. admitted to [facility]/07[facility]14 with profound iron deficiency anemia Hgb 4.5, melena, with new diagnosis of invasive adenocarcinoma of colon s/p lap R colectomy on 12/15/14.  . Iron deficiency anemia     a. 11/2014 - Hgb 4.5 in setting of newly dx colon CA.   Marland Kitchen PAF (paroxysmal atrial fibrillation) ([place])     a. Not on anticoag due to bleeding and fall risk.   Past Surgical History  Procedure  Laterality Date  . Cardiac catheterization    . Coronary artery bypass graft  1991    grafts to LAD and RCA  . Appendectomy    . Coronary artery bypass graft  1991  . Colonoscopy with propofol N/A 12/12/2014    Procedure: COLONOSCOPY WITH PROPOFOL;  Surgeon: Gatha Mayer, MD;  Location: [facility] ENDOSCOPY;  Service: Endoscopy;  Laterality: N/A;  . Colon resection N/A 12/15/2014    Procedure: Laparoscopic partial colectomy;  Surgeon: Autumn Messing III, MD;  Location: [facility] ORS;  Service: General;  Laterality: N/A;    Allergies  Allergen Reactions  . Sulfa Antibiotics Other (See Comments)    unknown  . Ativan [Lorazepam] Anxiety      Medication List       This list is accurate as of: 06/09/15  3:05 PM.  Always use your most recent med list.               acetaminophen 325 MG tablet  Commonly known as:  TYLENOL  Take 650 mg by mouth every 6 (six) hours as needed (for mild pain.).     albuterol (2.5 MG/3ML) 0.083% nebulizer solution  Commonly known as:  PROVENTIL  Take 2.5 mg by nebulization every 6 (six) hours as needed for wheezing or shortness of breath.     amiodarone 200 MG tablet  Commonly known as:  PACERONE  Take 1 tablet (200 mg total) by mouth daily.     atorvastatin 40 MG tablet  Commonly known as:  LIPITOR  Take 40 mg by mouth daily.     carvedilol 3.125 MG tablet  Commonly known as:  COREG  Take 1 tablet (3.125 mg total) by mouth 2 (two) times daily with a meal.     CEREFOLIN 07-19-48-5 MG Tabs  Take 1 tablet by mouth daily.     erythromycin ophthalmic ointment  Place 1 application into the right eye at bedtime.     feeding supplement Liqd  Take 1 Container by mouth 2 (two) times daily between meals. 127MLs     food thickener Powd  Commonly known as:  THICK IT  Take by mouth as needed.     furosemide 20 MG tablet  Commonly known as:  LASIX  Take 40 mg by mouth daily.     Glucosamine-Chondroitin 750-600 MG Tabs  Take 2 tablets by mouth daily.      l-methylfolate-B6-B12 3-35-2 MG Tabs tablet  Commonly known as:  METANX  Take 1 tablet by mouth daily.     levothyroxine 88 MCG tablet  Commonly known as:  SYNTHROID, LEVOTHROID  Take 88 mcg by mouth daily before breakfast.     multivitamin with minerals Tabs tablet  Take 1 tablet by mouth daily. CERTA-VITE SENIOR     mycophenolate 500 MG tablet  Commonly known as:  CELLCEPT  Take 500 mg by mouth daily.     polyethylene glycol packet  Commonly known as:  MIRALAX / GLYCOLAX  Take 17 g by mouth daily.  potassium chloride SA 20 MEQ tablet  Commonly known as:  K-DUR,KLOR-CON  Take 1 tablet (20 mEq total) by mouth daily.     tamsulosin 0.4 MG Caps capsule  Commonly known as:  FLOMAX  Take 0.8 mg by mouth daily.        Review of Systems  Constitutional: Negative for fever, chills and diaphoresis.  HENT: Positive for hearing loss. Negative for congestion and ear pain.   Eyes: Negative for pain, discharge and redness.  Respiratory: Negative for cough and shortness of breath.   Cardiovascular: Negative for chest pain, palpitations and leg swelling.  Gastrointestinal: Positive for constipation. Negative for nausea, vomiting, abdominal pain and diarrhea.  Genitourinary: Positive for frequency. Negative for dysuria and urgency.  Musculoskeletal: Positive for back pain, arthralgias and gait problem. Negative for myalgias and neck pain.       Ambulates with walker, w/c to go further. C/o right knee pain with walker, declined pain meds.   Skin: Negative for rash.       abd laparoscopic surgical scars. R hand skin tear  Neurological: Negative for dizziness, tremors, seizures, weakness and headaches.       Peripheral neuropathy  Psychiatric/Behavioral: Negative for suicidal ideas and hallucinations. The patient is not nervous/anxious.        Mood has been stabilized.     Immunization History  Administered Date(s) Administered  . Influenza-Unspecified 12/22/2013, 11/09/2014  . PPD  Test 01/03/2015  . Pneumococcal Polysaccharide-23 12/02/1993  . Tdap 01/08/2013  . Zoster 12/03/2007   Pertinent  Health Maintenance Due  Topic Date Due  . PNA vac Low Risk Adult (2 of 2 - PCV13) 12/03/1994  . INFLUENZA VACCINE  09/19/2015   Fall Risk  02/10/2015 06/27/2014  Falls in the past year? Yes No  Number falls in past yr: 1 -  Injury with Fall? No -  Risk for fall due to : History of fall(s) Impaired balance/gait  Follow up Falls evaluation completed -   Functional Status Survey:    Filed Vitals:   06/09/15 1133  BP: 156/78  Pulse: 80  Temp: 98.5 F (36.9 C)  TempSrc: Oral  Resp: 20  Height: 6' (1.829 m)  Weight: 190 lb 14.4 oz (86.592 kg)   Body mass index is 25.89 kg/(m^2). Physical Exam  Constitutional: He appears well-developed and well-nourished. No distress.  HENT:  Head: Normocephalic and atraumatic.  Right Ear: External ear normal.  Left Ear: External ear normal.  Nose: Nose normal.  Mouth/Throat: Oropharynx is clear and moist. No oropharyngeal exudate.  Eyes: Conjunctivae and EOM are normal. Pupils are equal, round, and reactive to light. Right eye exhibits no discharge. Left eye exhibits no discharge. No scleral icterus.     Neck: Normal range of motion. Neck supple. No JVD present. No tracheal deviation present. No thyromegaly present.  Cardiovascular: Normal rate and regular rhythm.  Exam reveals no gallop and no friction rub.   Murmur heard. Systolic murmur [id number] right sternal border   Pulmonary/Chest: Effort normal and breath sounds normal. No stridor. No respiratory distress. He has no wheezes. He has no rales. He exhibits no tenderness.  Abdominal: Soft. Bowel sounds are normal. He exhibits no distension and no mass. There is no tenderness.  Musculoskeletal: Normal range of motion. He exhibits tenderness. He exhibits no edema.  R knee pain with walking, no erythema, swelling, patellar ballottement, or crepitus.   Neurological: He is alert. He  has normal reflexes. No cranial nerve deficit. He exhibits normal muscle tone.  Coordination normal.  Skin: Skin is warm and dry. No rash noted. He is not diaphoretic. No erythema. No pallor.  Small skin tear to the right hand.   Psychiatric: He has a normal mood and affect. Thought content normal. Cognition and memory are impaired. He exhibits abnormal recent memory. He exhibits normal remote memory.  Memory deficits.     Labs reviewed:  Recent Labs  12/20/14 0510 12/21/14 0500  01/31/15 04/25/15 05/19/15 1521  NA 137 136  < > 139 143 141  K 3.1* 3.2*  < > 4.2 3.7 4.3  CL 102 104  --   --   --  104  CO2 28 26  --   --   --  29  GLUCOSE 97 98  --   --   --  123*  BUN 27* 22*  < > 20 23* 25  CREATININE 1.16 1.11  < > 1.2 1.4* 1.40*  CALCIUM 8.4* 8.4*  --   --   --  9.1  MG 1.9  --   --   --   --   --   < > = values in this interval not displayed.  Recent Labs  12/06/14 12/07/14 0144 04/25/15  AST 16 21 18   ALT 14 20 13   ALKPHOS 66 79 70  BILITOT  --  0.9  --   PROT  --  5.8*  --   ALBUMIN  --  3.6  --     Recent Labs  12/07/14 0144  12/20/14 0510 12/21/14 0500  01/31/15 04/25/15 05/19/15 1521  WBC 4.3  < > 6.2 8.2  < > 4.4 4.2 4.1  NEUTROABS 2.7  --   --   --   --   --   --  2.5  HGB 4.9*  < > 9.3* 8.8*  < > 10.2* 11.0* 12.3*  HCT 16.5*  < > 30.1* 28.1*  < > 33* 34* 37.6*  MCV 77.5*  < > 79.4 77.8*  --   --   --  94.0  PLT 273  < > 369 336  < > 257 173 189  < > = values in this interval not displayed. Lab Results  Component Value Date   TSH 2.09 04/25/2015   Lab Results  Component Value Date   HGBA1C 5.9* 12/15/2014   Lab Results  Component Value Date   CHOL 93 12/07/2014   HDL 36* 12/07/2014   LDLCALC 47 12/07/2014   TRIG 51 12/07/2014   CHOLHDL 2.6 12/07/2014    Significant Diagnostic Results in last 30 days:  No results found.  Assessment/Plan  Abnormality of gait Ambulates with walker, w/c to go further, c/o the right knee pain with walker,  but declined pain meds, no patellar ballottement or crepitus, no apparent swelling or warmth noted. Continue to observe.   Alzheimer's disease [facility], SNF for care needs, baseline confusion.   Anemia, iron deficiency 05/22/15 dc Fe, 05/27/15 Hgb 12.2, update CBC in one month.   Arthritis Multiple sites, c/o the right pain with ambulation, continue Tylenol 1000mg  daily.  Benign prostatic hyperplasia No urinary retention. cotninueTamsulosin 0.8 mg daily.   Chronic diastolic CHF (congestive heart failure) ([place]) 06/06/15 Na 140, K 3.9, bun 22, creat 1.36 Clinically compensated, continue Furosemide 40mg  daily.  Chronic kidney disease 06/06/15 Na 140, K 3.9, Bun 22, creat 1.36 Furosemide contributory.   Colon cancer [facility]) of the transverse colon 02/25/14 s/p partial colectomy 12/15/14 by Dr. Marlou Starks, staple removed.  Constipation Stable, continue MiraLax daily.   Edema Trace in ankles, continue Furosemide.   Essential hypertension Controlled, continue Coreg 3.125mg  bid, Atorvastatin 40mg  daily for risk reduction.    History of atrial fibrillation Heart rate is in control, continue Amiodarone 200mg  daily.    Hypothyroidism Continue Levothyroxine 49mcg daily, 01/31/15 TSH 4.6, 04/25/15 TSH 2.09   Inflammatory and toxic neuropathy 11/15/14 decrease Cellcept to 500mg  daily, then dc in 3 months-toxicity. 03/01/15 continue Cellcept 500mg  daily, f/u Neurology   Peripheral autonomic neuropathy in disorders classified elsewhere 11/15/14 decrease Cellcept to 500mg  daily, then dc in 3 months-toxicity. 03/01/15 continue Cellcept 500mg  daily, f/u Neurology    Family/ staff Communication: continue SNF for care needs.   Labs/tests ordered: none

## 2015-06-09 NOTE — Assessment & Plan Note (Signed)
Stable, continue MiraLax daily.  

## 2015-06-09 NOTE — Assessment & Plan Note (Signed)
06/06/15 Na 140, K 3.9, Bun 22, creat 1.36 Furosemide contributory.

## 2015-06-09 NOTE — Assessment & Plan Note (Signed)
Trace in ankles, continue Furosemide.

## 2015-06-09 NOTE — Assessment & Plan Note (Signed)
No urinary retention. cotninueTamsulosin 0.8 mg daily.  

## 2015-06-09 NOTE — Assessment & Plan Note (Signed)
06/06/15 Na 140, K 3.9, bun 22, creat 1.36 Clinically compensated, continue Furosemide 40mg  daily.

## 2015-06-09 NOTE — Assessment & Plan Note (Signed)
Continue Levothyroxine 63mcg daily, 01/31/15 TSH 4.6, 04/25/15 TSH 2.09

## 2015-06-09 NOTE — Assessment & Plan Note (Signed)
11/15/14 decrease Cellcept to 500mg  daily, then dc in 3 months-toxicity. 03/01/15 continue Cellcept 500mg  daily, f/u Neurology

## 2015-06-09 NOTE — Assessment & Plan Note (Signed)
Off Namenda, SNF for care needs, baseline confusion.  

## 2015-06-09 NOTE — Assessment & Plan Note (Signed)
02/25/14 s/p partial colectomy 12/15/14 by Dr. Marlou Starks, staple removed.

## 2015-06-09 NOTE — Assessment & Plan Note (Signed)
Multiple sites, c/o the right pain with ambulation, continue Tylenol 1000mg  daily.

## 2015-06-10 DIAGNOSIS — M25569 Pain in unspecified knee: Secondary | ICD-10-CM | POA: Diagnosis not present

## 2015-06-20 DIAGNOSIS — D509 Iron deficiency anemia, unspecified: Secondary | ICD-10-CM | POA: Diagnosis not present

## 2015-06-20 LAB — CBC AND DIFFERENTIAL
HEMATOCRIT: 38 % — AB (ref 41–53)
HEMOGLOBIN: 12.4 g/dL — AB (ref 13.5–17.5)
PLATELETS: 188 10*3/uL (ref 150–399)
WBC: 4.4 10*3/mL

## 2015-07-07 ENCOUNTER — Non-Acute Institutional Stay (SKILLED_NURSING_FACILITY): Payer: Medicare Other | Admitting: Nurse Practitioner

## 2015-07-07 ENCOUNTER — Encounter: Payer: Self-pay | Admitting: Nurse Practitioner

## 2015-07-07 DIAGNOSIS — R609 Edema, unspecified: Secondary | ICD-10-CM

## 2015-07-07 DIAGNOSIS — Z8679 Personal history of other diseases of the circulatory system: Secondary | ICD-10-CM

## 2015-07-07 DIAGNOSIS — M1990 Unspecified osteoarthritis, unspecified site: Secondary | ICD-10-CM

## 2015-07-07 DIAGNOSIS — F0280 Dementia in other diseases classified elsewhere without behavioral disturbance: Secondary | ICD-10-CM

## 2015-07-07 DIAGNOSIS — G909 Disorder of the autonomic nervous system, unspecified: Secondary | ICD-10-CM

## 2015-07-07 DIAGNOSIS — K5900 Constipation, unspecified: Secondary | ICD-10-CM | POA: Diagnosis not present

## 2015-07-07 DIAGNOSIS — G308 Other Alzheimer's disease: Secondary | ICD-10-CM | POA: Diagnosis not present

## 2015-07-07 DIAGNOSIS — G619 Inflammatory polyneuropathy, unspecified: Secondary | ICD-10-CM

## 2015-07-07 DIAGNOSIS — I10 Essential (primary) hypertension: Secondary | ICD-10-CM

## 2015-07-07 DIAGNOSIS — E039 Hypothyroidism, unspecified: Secondary | ICD-10-CM

## 2015-07-07 DIAGNOSIS — N182 Chronic kidney disease, stage 2 (mild): Secondary | ICD-10-CM | POA: Diagnosis not present

## 2015-07-07 DIAGNOSIS — G622 Polyneuropathy due to other toxic agents: Secondary | ICD-10-CM | POA: Diagnosis not present

## 2015-07-07 DIAGNOSIS — D509 Iron deficiency anemia, unspecified: Secondary | ICD-10-CM

## 2015-07-07 DIAGNOSIS — N400 Enlarged prostate without lower urinary tract symptoms: Secondary | ICD-10-CM

## 2015-07-07 DIAGNOSIS — G990 Autonomic neuropathy in diseases classified elsewhere: Secondary | ICD-10-CM

## 2015-07-07 NOTE — Assessment & Plan Note (Signed)
05/22/15 dc Fe, 05/27/15 Hgb 12.2, 06/20/15 Hgb 12.4

## 2015-07-07 NOTE — Progress Notes (Signed)
Patient ID: Jesse Burton, male   DOB: Mar 11, 1925, 82 y.o.   MRN: [id number]  Location:  [facility] Room Number: 23 Place of Service:  SNF (31) Provider: Lennie Odor Keone Kamer NP  GREEN, [name], MD  Patient Care Team: Estill Dooms, MD as PCP - General (Internal Medicine) Zebedee Iba, MD as Referring Physician (Ophthalmology) Darlin Coco, MD as Consulting Physician (Cardiology) Garvin Fila, MD as Consulting Physician (Neurology) Festus Aloe, MD as Consulting Physician (Urology) Marilynne Halsted, MD as Referring Physician (Ophthalmology) Lavonna Monarch, MD as Consulting Physician (Dermatology) Gerarda Fraction, MD as Referring Physician (Ophthalmology)  Extended Emergency Contact Information Primary Emergency Contact: Farr,Laura Address: [address] [place] [address] Johnnette Litter of [place] Phone: [phone] Mobile Phone: [phone] Relation: Daughter Secondary Emergency Contact: Genia Plants [place] of [place] Phone: [phone] Relation: Daughter  Code Status:  DNR Goals of care: Advanced Directive information Advanced Directives 07/07/2015  Does patient have an advance directive? No  Would patient like information on creating an advanced directive? No - patient declined information     Chief Complaint  Patient presents with  . Medical Management of Chronic Issues    HPI:  Pt is a 82 y.o. male seen today for medical management of chronic diseases.  Hx of anemia, s/p partial colectomy.  CAD has been stable, no angina since last visit, hypothyroidism has been supplemented well with last TSH 4.6 01/31/15, HTN is controlled, his memory has been gradual decline but functioning well in SNF, [facility], peripheral neuropathy has been stable, he walks with walker for short distance and w/c to go further.   Past Medical History  Diagnosis Date  . History of atrial fibrillation   . Thyroid disease   . Memory  loss   . Stroke ([place])   . 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 Transient cerebral ischemia 07/04/2012    a posterior circulation TIA in October 2009.    Marland Kitchen HTN (hypertension) 12/01/2013  . CAD (coronary artery disease) of artery bypass graft 12/02/2013  . Hypothyroidism 12/01/2013  . Peripheral autonomic neuropathy in disorders classified elsewhere(337.1) 07/04/2012  . 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 Hereditary and idiopathic peripheral neuropathy 07/04/2012    a history of chronic sensory polyneuropathy of undetermined origin with abnormality of gait. He is walking with a rolling walker. Last MRA of the head January 2013 shows no significant stenosis, MRA of the neck shows mild stenosis of the proximal left internal carotid artery.    . Abnormality of gait 07/04/2012  . Dizziness and giddiness 07/04/2012  . Benign prostatic hyperplasia   . Edema   . Chorioretinitis   . Panuveitis   . Internal carotid artery stenosis     left  . Mild left ventricular hypertrophy   . Actinic keratoses   . Diverticulitis   . Prostatitis, acute     1991  . Arthritis   . Neuromuscular disorder ([place])   . Essential hypertension   . Hypothyroidism   . BPH (benign prostatic hyperplasia)   . Retinitis   . HLD (hyperlipidemia)   . CAD (coronary artery disease) of bypass graft     a. s/p CABG 1991 without subsequent interventions.  . Chronic diastolic CHF (congestive heart failure) ([place])   . Hyperlipidemia   . Dementia   . Colon cancer [facility])     a. admitted to [facility] 12/07/14 with profound iron deficiency anemia  Hgb 4.5, melena, with new diagnosis of invasive adenocarcinoma of colon s/p lap R colectomy on 12/15/14.  . Iron deficiency anemia     a. 11/2014 - Hgb 4.5 in setting of newly dx colon CA.   Marland Kitchen PAF (paroxysmal atrial fibrillation) ([place])     a. Not on anticoag due to bleeding and fall risk.   Past Surgical History  Procedure Laterality Date  . Cardiac catheterization    .  Coronary artery bypass graft  1991    grafts to LAD and RCA  . Appendectomy    . Coronary artery bypass graft  1991  . Colonoscopy with propofol N/A 12/12/2014    Procedure: COLONOSCOPY WITH PROPOFOL;  Surgeon: Gatha Mayer, MD;  Location: [facility] ENDOSCOPY;  Service: Endoscopy;  Laterality: N/A;  . Colon resection N/A 12/15/2014    Procedure: Laparoscopic partial colectomy;  Surgeon: Autumn Messing III, MD;  Location: [facility] ORS;  Service: General;  Laterality: N/A;    Allergies  Allergen Reactions  . Sulfa Antibiotics Other (See Comments)    unknown  . Ativan [Lorazepam] Anxiety      Medication List       This list is accurate as of: 07/07/15  2:03 PM.  Always use your most recent med list.               acetaminophen 325 MG tablet  Commonly known as:  TYLENOL  Take 650 mg by mouth every 6 (six) hours as needed (for mild pain.).     albuterol (2.5 MG/3ML) 0.083% nebulizer solution  Commonly known as:  PROVENTIL  Take 2.5 mg by nebulization every 6 (six) hours as needed for wheezing or shortness of breath.     amiodarone 200 MG tablet  Commonly known as:  PACERONE  Take 1 tablet (200 mg total) by mouth daily.     atorvastatin 40 MG tablet  Commonly known as:  LIPITOR  Take 40 mg by mouth at bedtime.     carvedilol 3.125 MG tablet  Commonly known as:  COREG  Take 1 tablet (3.125 mg total) by mouth 2 (two) times daily with a meal.     CEREFOLIN 07-19-48-5 MG Tabs  Take 1 tablet by mouth daily.     erythromycin ophthalmic ointment  Place 1 application into the right eye at bedtime.     feeding supplement Liqd  Take 1 Container by mouth 2 (two) times daily between meals. 127MLs     food thickener Powd  Commonly known as:  THICK IT  Take by mouth as needed.     furosemide 20 MG tablet  Commonly known as:  LASIX  Take by mouth. Take 2 tablets = 40 mg daily.     Glucosamine-Chondroitin 750-600 MG Tabs  Take 2 tablets by mouth daily.     levothyroxine 88 MCG tablet    Commonly known as:  SYNTHROID, LEVOTHROID  Take 88 mcg by mouth daily before breakfast.     multivitamin with minerals Tabs tablet  Take 1 tablet by mouth daily. CERTA-VITE SENIOR     mycophenolate 500 MG tablet  Commonly known as:  CELLCEPT  Take 500 mg by mouth daily.     polyethylene glycol packet  Commonly known as:  MIRALAX / GLYCOLAX  Take 17 g by mouth daily.     potassium chloride SA 20 MEQ tablet  Commonly known as:  K-DUR,KLOR-CON  Take 1 tablet (20 mEq total) by mouth daily.     tamsulosin 0.4 MG Caps  capsule  Commonly known as:  FLOMAX  Take 0.8 mg by mouth daily.        Review of Systems  Constitutional: Negative for fever, chills and diaphoresis.  HENT: Positive for hearing loss. Negative for congestion and ear pain.   Eyes: Negative for pain, discharge and redness.  Respiratory: Negative for cough and shortness of breath.   Cardiovascular: Negative for chest pain, palpitations and leg swelling.  Gastrointestinal: Positive for constipation. Negative for nausea, vomiting, abdominal pain and diarrhea.  Genitourinary: Positive for frequency. Negative for dysuria and urgency.  Musculoskeletal: Positive for back pain, arthralgias and gait problem. Negative for myalgias and neck pain.       Ambulates with walker, w/c to go further. C/o right knee pain with walker, declined pain meds.   Skin: Negative for rash.       abd laparoscopic surgical scars. R hand skin tear  Neurological: Negative for dizziness, tremors, seizures, weakness and headaches.       Peripheral neuropathy  Psychiatric/Behavioral: Negative for suicidal ideas and hallucinations. The patient is not nervous/anxious.        Mood has been stabilized.     Immunization History  Administered Date(s) Administered  . Influenza-Unspecified 12/22/2013, 11/09/2014  . PPD Test 01/03/2015  . Pneumococcal Polysaccharide-23 12/02/1993  . Tdap 01/08/2013  . Zoster 12/03/2007   Pertinent  Health Maintenance  Due  Topic Date Due  . PNA vac Low Risk Adult (2 of 2 - PCV13) 12/03/1994  . INFLUENZA VACCINE  09/19/2015   Fall Risk  02/10/2015 06/27/2014  Falls in the past year? Yes No  Number falls in past yr: 1 -  Injury with Fall? No -  Risk for fall due to : History of fall(s) Impaired balance/gait  Follow up Falls evaluation completed -   Functional Status Survey:    Filed Vitals:   07/07/15 1032  BP: 113/88  Pulse: 76  Temp: 96.3 F (35.7 C)  TempSrc: Oral  Resp: 18  Height: 6' (1.829 m)  Weight: 192 lb (87.091 kg)   Body mass index is 26.03 kg/(m^2). Physical Exam  Constitutional: He appears well-developed and well-nourished. No distress.  HENT:  Head: Normocephalic and atraumatic.  Right Ear: External ear normal.  Left Ear: External ear normal.  Nose: Nose normal.  Mouth/Throat: Oropharynx is clear and moist. No oropharyngeal exudate.  Eyes: Conjunctivae and EOM are normal. Pupils are equal, round, and reactive to light. Right eye exhibits no discharge. Left eye exhibits no discharge. No scleral icterus.     Neck: Normal range of motion. Neck supple. No JVD present. No tracheal deviation present. No thyromegaly present.  Cardiovascular: Normal rate and regular rhythm.  Exam reveals no gallop and no friction rub.   Murmur heard. Systolic murmur [id number] right sternal border   Pulmonary/Chest: Effort normal and breath sounds normal. No stridor. No respiratory distress. He has no wheezes. He has no rales. He exhibits no tenderness.  Abdominal: Soft. Bowel sounds are normal. He exhibits no distension and no mass. There is no tenderness.  Musculoskeletal: Normal range of motion. He exhibits tenderness. He exhibits no edema.  R knee pain with walking, no erythema, swelling, patellar ballottement, or crepitus.   Neurological: He is alert. He has normal reflexes. No cranial nerve deficit. He exhibits normal muscle tone. Coordination normal.  Skin: Skin is warm and dry. No rash noted.  He is not diaphoretic. No erythema. No pallor.  Small skin tear to the right hand.   Psychiatric: He  has a normal mood and affect. Thought content normal. Cognition and memory are impaired. He exhibits abnormal recent memory. He exhibits normal remote memory.  Memory deficits.     Labs reviewed:  Recent Labs  12/20/14 0510 12/21/14 0500  01/31/15 04/25/15 05/19/15 1521  NA 137 136  < > 139 143 141  K 3.1* 3.2*  < > 4.2 3.7 4.3  CL 102 104  --   --   --  104  CO2 28 26  --   --   --  29  GLUCOSE 97 98  --   --   --  123*  BUN 27* 22*  < > 20 23* 25  CREATININE 1.16 1.11  < > 1.2 1.4* 1.40*  CALCIUM 8.4* 8.4*  --   --   --  9.1  MG 1.9  --   --   --   --   --   < > = values in this interval not displayed.  Recent Labs  12/06/14 12/07/14 0144 04/25/15  AST 16 21 18   ALT 14 20 13   ALKPHOS 66 79 70  BILITOT  --  0.9  --   PROT  --  5.8*  --   ALBUMIN  --  3.6  --     Recent Labs  12/07/14 0144  12/20/14 0510 12/21/14 0500  01/31/15 04/25/15 05/19/15 1521  WBC 4.3  < > 6.2 8.2  < > 4.4 4.2 4.1  NEUTROABS 2.7  --   --   --   --   --   --  2.5  HGB 4.9*  < > 9.3* 8.8*  < > 10.2* 11.0* 12.3*  HCT 16.5*  < > 30.1* 28.1*  < > 33* 34* 37.6*  MCV 77.5*  < > 79.4 77.8*  --   --   --  94.0  PLT 273  < > 369 336  < > 257 173 189  < > = values in this interval not displayed. Lab Results  Component Value Date   TSH 2.09 04/25/2015   Lab Results  Component Value Date   HGBA1C 5.9* 12/15/2014   Lab Results  Component Value Date   CHOL 93 12/07/2014   HDL 36* 12/07/2014   LDLCALC 47 12/07/2014   TRIG 51 12/07/2014   CHOLHDL 2.6 12/07/2014    Significant Diagnostic Results in last 30 days:  No results found.  Assessment/Plan  Peripheral autonomic neuropathy in disorders classified elsewhere 11/15/14 decrease Cellcept to 500mg  daily, then dc in 3 months-toxicity. 03/01/15 continue Cellcept 500mg  daily, f/u Neurology   Inflammatory and toxic neuropathy 11/15/14  decrease Cellcept to 500mg  daily, then dc in 3 months-toxicity. 03/01/15 continue Cellcept 500mg  daily, f/u Neurology    Hypothyroidism Continue Levothyroxine 46mcg daily, 01/31/15 TSH 4.6, 04/25/15 TSH 2.09    History of atrial fibrillation Heart rate is in control, continue Amiodarone 200mg  daily.    Essential hypertension Controlled, continue Coreg 3.125mg  bid, Atorvastatin 40mg  daily for risk reduction. 06/06/15 Na 140, K 3.9, bun 22, creat 1.36       Edema race in ankles, continue Furosemide. 05/29/15 Na 136, K 4.5, Bun 23, creat 1.28      Constipation Stable, continue MiraLax daily.    Chronic kidney disease 06/06/15 Na 140, K 3.9, Bun 22, creat 1.36 Furosemide contributory.    Benign prostatic hyperplasia No urinary retention. cotninueTamsulosin 0.8 mg daily.    Arthritis 06/10/15 X-ray R knee: mild osteoarthritis, no acute fracture or dislocation.  Chronic, the patient declined analgesics.   Anemia, iron deficiency 05/22/15 dc Fe, 05/27/15 Hgb 12.2, 06/20/15 Hgb 12.4     Alzheimer's disease [facility], SNF for care needs, baseline confusion.      Family/ staff Communication: continue SNF for care needs.   Labs/tests ordered: none

## 2015-07-07 NOTE — Assessment & Plan Note (Signed)
11/15/14 decrease Cellcept to 500mg  daily, then dc in 3 months-toxicity. 03/01/15 continue Cellcept 500mg  daily, f/u Neurology

## 2015-07-07 NOTE — Assessment & Plan Note (Signed)
06/06/15 Na 140, K 3.9, Bun 22, creat 1.36 Furosemide contributory.

## 2015-07-07 NOTE — Assessment & Plan Note (Signed)
Stable, continue MiraLax daily.  

## 2015-07-07 NOTE — Assessment & Plan Note (Signed)
Heart rate is in control, continue Amiodarone 200mg daily.  

## 2015-07-07 NOTE — Assessment & Plan Note (Signed)
Controlled, continue Coreg 3.125mg  bid, Atorvastatin 40mg  daily for risk reduction. 06/06/15 Na 140, K 3.9, bun 22, creat 1.36

## 2015-07-07 NOTE — Assessment & Plan Note (Signed)
Continue Levothyroxine 1mcg daily, 01/31/15 TSH 4.6, 04/25/15 TSH 2.09

## 2015-07-07 NOTE — Assessment & Plan Note (Signed)
06/10/15 X-ray R knee: mild osteoarthritis, no acute fracture or dislocation.  Chronic, the patient declined analgesics.

## 2015-07-07 NOTE — Assessment & Plan Note (Signed)
Off Namenda, SNF for care needs, baseline confusion.

## 2015-07-07 NOTE — Assessment & Plan Note (Signed)
No urinary retention. cotninueTamsulosin 0.8 mg daily.  

## 2015-07-07 NOTE — Assessment & Plan Note (Signed)
race in ankles, continue Furosemide. 05/29/15 Na 136, K 4.5, Bun 23, creat 1.28

## 2015-07-24 ENCOUNTER — Non-Acute Institutional Stay (SKILLED_NURSING_FACILITY): Payer: Medicare Other | Admitting: Nurse Practitioner

## 2015-07-24 ENCOUNTER — Encounter: Payer: Self-pay | Admitting: Nurse Practitioner

## 2015-07-24 DIAGNOSIS — G308 Other Alzheimer's disease: Secondary | ICD-10-CM | POA: Diagnosis not present

## 2015-07-24 DIAGNOSIS — I10 Essential (primary) hypertension: Secondary | ICD-10-CM | POA: Diagnosis not present

## 2015-07-24 DIAGNOSIS — N182 Chronic kidney disease, stage 2 (mild): Secondary | ICD-10-CM

## 2015-07-24 DIAGNOSIS — E039 Hypothyroidism, unspecified: Secondary | ICD-10-CM | POA: Diagnosis not present

## 2015-07-24 DIAGNOSIS — I5032 Chronic diastolic (congestive) heart failure: Secondary | ICD-10-CM | POA: Diagnosis not present

## 2015-07-24 DIAGNOSIS — F0280 Dementia in other diseases classified elsewhere without behavioral disturbance: Secondary | ICD-10-CM

## 2015-07-24 DIAGNOSIS — N400 Enlarged prostate without lower urinary tract symptoms: Secondary | ICD-10-CM

## 2015-07-24 DIAGNOSIS — K5900 Constipation, unspecified: Secondary | ICD-10-CM | POA: Diagnosis not present

## 2015-07-24 DIAGNOSIS — I480 Paroxysmal atrial fibrillation: Secondary | ICD-10-CM

## 2015-07-24 DIAGNOSIS — D509 Iron deficiency anemia, unspecified: Secondary | ICD-10-CM

## 2015-07-24 DIAGNOSIS — I25700 Atherosclerosis of coronary artery bypass graft(s), unspecified, with unstable angina pectoris: Secondary | ICD-10-CM

## 2015-07-24 DIAGNOSIS — G609 Hereditary and idiopathic neuropathy, unspecified: Secondary | ICD-10-CM | POA: Diagnosis not present

## 2015-07-24 DIAGNOSIS — R609 Edema, unspecified: Secondary | ICD-10-CM | POA: Diagnosis not present

## 2015-07-24 DIAGNOSIS — R269 Unspecified abnormalities of gait and mobility: Secondary | ICD-10-CM

## 2015-07-24 DIAGNOSIS — M1990 Unspecified osteoarthritis, unspecified site: Secondary | ICD-10-CM

## 2015-07-24 NOTE — Assessment & Plan Note (Signed)
No urinary retention. cotninueTamsulosin 0.8 mg daily.  

## 2015-07-24 NOTE — Assessment & Plan Note (Signed)
Frequent falls, lack of safety related to dementia and increased frailty due to comorbidity are contributory, close supervision needed.

## 2015-07-24 NOTE — Assessment & Plan Note (Signed)
Continue Levothyroxine 35mcg daily, 01/31/15 TSH 4.6, 04/25/15 TSH 2.09

## 2015-07-24 NOTE — Progress Notes (Signed)
Patient ID: Jesse Burton, male   DOB: [birth year], 82 y.o.   MRN: [id number]  Location:  [facility] Room Number: 23 Place of Service:  SNF (31) Provider: Lennie Odor Siya Flurry NP  GREEN, [name], MD  Patient Care Team: Estill Dooms, MD as PCP - General (Internal Medicine) Zebedee Iba, MD as Referring Physician (Ophthalmology) Darlin Coco, MD as Consulting Physician (Cardiology) Garvin Fila, MD as Consulting Physician (Neurology) Festus Aloe, MD as Consulting Physician (Urology) Marilynne Halsted, MD as Referring Physician (Ophthalmology) Lavonna Monarch, MD as Consulting Physician (Dermatology) Gerarda Fraction, MD as Referring Physician (Ophthalmology)  Extended Emergency Contact Information Primary Emergency Contact: Farr,Laura Address: [address] [place] [address] Johnnette Litter of [place] Phone: [phone] Mobile Phone: [phone] Relation: Daughter Secondary Emergency Contact: Genia Plants [place] of [place] Phone: [phone] Relation: Daughter  Code Status:  DNR Goals of care: Advanced Directive information Advanced Directives 07/24/2015  Does patient have an advance directive? No  Would patient like information on creating an advanced directive? No - patient declined information     Chief Complaint  Patient presents with  . Acute Visit    Found on the floor by staff this morning.    HPI:  Pt is a 82 y.o. male seen today for medical management of chronic diseases.  Hx of anemia, s/p partial colectomy.  CAD has been stable, no angina since last visit, hypothyroidism has been supplemented well with last TSH 4.6 01/31/15, HTN is controlled, his memory has been gradual decline but functioning well in SNF, off [facility], peripheral neuropathy has been stable, he walks with walker for short distance and w/c to go further.   Past Medical History  Diagnosis Date  . History of atrial fibrillation   . Thyroid  disease   . Memory loss   . Stroke ([place])   . 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 Transient cerebral ischemia 07/04/2012    a posterior circulation TIA in October 2009.    Marland Kitchen HTN (hypertension) 12/01/2013  . CAD (coronary artery disease) of artery bypass graft 12/02/2013  . Hypothyroidism 12/01/2013  . Peripheral autonomic neuropathy in disorders classified elsewhere(337.1) 07/04/2012  . 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 Hereditary and idiopathic peripheral neuropathy 07/04/2012    a history of chronic sensory polyneuropathy of undetermined origin with abnormality of gait. He is walking with a rolling walker. Last MRA of the head January 2013 shows no significant stenosis, MRA of the neck shows mild stenosis of the proximal left internal carotid artery.    . Abnormality of gait 07/04/2012  . Dizziness and giddiness 07/04/2012  . Benign prostatic hyperplasia   . Edema   . Chorioretinitis   . Panuveitis   . Internal carotid artery stenosis     left  . Mild left ventricular hypertrophy   . Actinic keratoses   . Diverticulitis   . Prostatitis, acute     1991  . Arthritis   . Neuromuscular disorder ([place])   . Essential hypertension   . Hypothyroidism   . BPH (benign prostatic hyperplasia)   . Retinitis   . HLD (hyperlipidemia)   . CAD (coronary artery disease) of bypass graft     a. s/p CABG 1991 without subsequent interventions.  . Chronic diastolic CHF (congestive heart failure) ([place])   . Hyperlipidemia   . Dementia   . Colon cancer [facility])     a. admitted  to [facility] 12/07/14 with profound iron deficiency anemia Hgb 4.5, melena, with new diagnosis of invasive adenocarcinoma of colon s/p lap R colectomy on 12/15/14.  . Iron deficiency anemia     a. 11/2014 - Hgb 4.5 in setting of newly dx colon CA.   Marland Kitchen PAF (paroxysmal atrial fibrillation) ([place])     a. Not on anticoag due to bleeding and fall risk.   Past Surgical History  Procedure Laterality Date  . Cardiac  catheterization    . Coronary artery bypass graft  1991    grafts to LAD and RCA  . Appendectomy    . Coronary artery bypass graft  1991  . Colonoscopy with propofol N/A 12/12/2014    Procedure: COLONOSCOPY WITH PROPOFOL;  Surgeon: Gatha Mayer, MD;  Location: [facility];  Service: Endoscopy;  Laterality: N/A;  . Colon resection N/A 12/15/2014    Procedure: Laparoscopic partial colectomy;  Surgeon: Autumn Messing III, MD;  Location: [facility] ORS;  Service: General;  Laterality: N/A;    Allergies  Allergen Reactions  . Sulfa Antibiotics Other (See Comments)    unknown  . Ativan [Lorazepam] Anxiety      Medication List       This list is accurate as of: 07/24/15 11:59 PM.  Always use your most recent med list.               acetaminophen 325 MG tablet  Commonly known as:  TYLENOL  Take 650 mg by mouth every 6 (six) hours as needed (for mild pain.).     albuterol (2.5 MG/3ML) 0.083% nebulizer solution  Commonly known as:  PROVENTIL  Take 2.5 mg by nebulization every 6 (six) hours as needed for wheezing or shortness of breath.     amiodarone 200 MG tablet  Commonly known as:  PACERONE  Take 1 tablet (200 mg total) by mouth daily.     atorvastatin 40 MG tablet  Commonly known as:  LIPITOR  Take 40 mg by mouth at bedtime.     carvedilol 3.125 MG tablet  Commonly known as:  COREG  Take 1 tablet (3.125 mg total) by mouth 2 (two) times daily with a meal.     CEREFOLIN 07-19-48-5 MG Tabs  Take 1 tablet by mouth daily.     erythromycin ophthalmic ointment  Place 1 application into the right eye at bedtime.     feeding supplement Liqd  Take 1 Container by mouth 2 (two) times daily between meals. 127MLs     food thickener Powd  Commonly known as:  THICK IT  Take by mouth as needed.     furosemide 20 MG tablet  Commonly known as:  LASIX  Take by mouth. Take 2 tablets = 40 mg daily.     Glucosamine-Chondroitin 750-600 MG Tabs  Take 2 tablets by mouth daily.     levothyroxine  88 MCG tablet  Commonly known as:  SYNTHROID, LEVOTHROID  Take 88 mcg by mouth daily before breakfast.     multivitamin with minerals Tabs tablet  Take 1 tablet by mouth daily. CERTA-VITE SENIOR     mycophenolate 500 MG tablet  Commonly known as:  CELLCEPT  Take 500 mg by mouth daily.     polyethylene glycol packet  Commonly known as:  MIRALAX / GLYCOLAX  Take 17 g by mouth daily.     potassium chloride SA 20 MEQ tablet  Commonly known as:  K-DUR,KLOR-CON  Take 1 tablet (20 mEq total) by mouth daily.  tamsulosin 0.4 MG Caps capsule  Commonly known as:  FLOMAX  Take 0.8 mg by mouth daily.        Review of Systems  Constitutional: Negative for fever, chills and diaphoresis.  HENT: Positive for hearing loss. Negative for congestion and ear pain.   Eyes: Negative for pain, discharge and redness.  Respiratory: Negative for cough and shortness of breath.   Cardiovascular: Negative for chest pain, palpitations and leg swelling.  Gastrointestinal: Positive for constipation. Negative for nausea, vomiting, abdominal pain and diarrhea.  Genitourinary: Positive for frequency. Negative for dysuria and urgency.  Musculoskeletal: Positive for back pain, arthralgias and gait problem. Negative for myalgias and neck pain.       Ambulates with walker, w/c to go further. C/o right knee pain with walker, declined pain meds. Frequent falls.   Skin: Negative for rash.       abd laparoscopic surgical scars. R hand skin tear  Neurological: Negative for dizziness, tremors, seizures, weakness and headaches.       Peripheral neuropathy  Psychiatric/Behavioral: Negative for suicidal ideas and hallucinations. The patient is not nervous/anxious.        Mood has been stabilized.     Immunization History  Administered Date(s) Administered  . Influenza-Unspecified 12/22/2013, 11/09/2014  . PPD Test 01/03/2015  . Pneumococcal Polysaccharide-23 12/02/1993  . Tdap 01/08/2013  . Zoster 12/03/2007    Pertinent  Health Maintenance Due  Topic Date Due  . PNA vac Low Risk Adult (2 of 2 - PCV13) 12/03/1994  . INFLUENZA VACCINE  09/19/2015   Fall Risk  02/10/2015 06/27/2014  Falls in the past year? Yes No  Number falls in past yr: 1 -  Injury with Fall? No -  Risk for fall due to : History of fall(s) Impaired balance/gait  Follow up Falls evaluation completed -   Functional Status Survey:    Filed Vitals:   07/24/15 1253  BP: 122/68  Pulse: 73  Temp: 97.9 F (36.6 C)  TempSrc: Oral  Resp: 19  Height: 6' (1.829 m)  Weight: 193 lb 11.2 oz (87.862 kg)  SpO2: 96%   Body mass index is 26.26 kg/(m^2). Physical Exam  Constitutional: He appears well-developed and well-nourished. No distress.  HENT:  Head: Normocephalic and atraumatic.  Right Ear: External ear normal.  Left Ear: External ear normal.  Nose: Nose normal.  Mouth/Throat: Oropharynx is clear and moist. No oropharyngeal exudate.  Eyes: Conjunctivae and EOM are normal. Pupils are equal, round, and reactive to light. Right eye exhibits no discharge. Left eye exhibits no discharge. No scleral icterus.     Neck: Normal range of motion. Neck supple. No JVD present. No tracheal deviation present. No thyromegaly present.  Cardiovascular: Normal rate and regular rhythm.  Exam reveals no gallop and no friction rub.   Murmur heard. Systolic murmur [id number] right sternal border   Pulmonary/Chest: Effort normal and breath sounds normal. No stridor. No respiratory distress. He has no wheezes. He has no rales. He exhibits no tenderness.  Abdominal: Soft. Bowel sounds are normal. He exhibits no distension and no mass. There is no tenderness.  Musculoskeletal: Normal range of motion. He exhibits tenderness. He exhibits no edema.  R knee pain with walking, no erythema, swelling, patellar ballottement, or crepitus.   Neurological: He is alert. He has normal reflexes. No cranial nerve deficit. He exhibits normal muscle tone. Coordination  normal.  Skin: Skin is warm and dry. No rash noted. He is not diaphoretic. No erythema. No pallor.  Small skin tear to the right hand.   Psychiatric: He has a normal mood and affect. Thought content normal. Cognition and memory are impaired. He exhibits abnormal recent memory. He exhibits normal remote memory.  Memory deficits.     Labs reviewed:  Recent Labs  12/20/14 0510 12/21/14 0500  01/31/15 04/25/15 05/19/15 1521  NA 137 136  < > 139 143 141  K 3.1* 3.2*  < > 4.2 3.7 4.3  CL 102 104  --   --   --  104  CO2 28 26  --   --   --  29  GLUCOSE 97 98  --   --   --  123*  BUN 27* 22*  < > 20 23* 25  CREATININE 1.16 1.11  < > 1.2 1.4* 1.40*  CALCIUM 8.4* 8.4*  --   --   --  9.1  MG 1.9  --   --   --   --   --   < > = values in this interval not displayed.  Recent Labs  12/06/14 12/07/14 0144 04/25/15  AST 16 21 18   ALT 14 20 13   ALKPHOS 66 79 70  BILITOT  --  0.9  --   PROT  --  5.8*  --   ALBUMIN  --  3.6  --     Recent Labs  12/07/14 0144  12/20/14 0510 12/21/14 0500  04/25/15 05/19/15 1521 06/20/15  WBC 4.3  < > 6.2 8.2  < > 4.2 4.1 4.4  NEUTROABS 2.7  --   --   --   --   --  2.5  --   HGB 4.9*  < > 9.3* 8.8*  < > 11.0* 12.3* 12.4*  HCT 16.5*  < > 30.1* 28.1*  < > 34* 37.6* 38*  MCV 77.5*  < > 79.4 77.8*  --   --  94.0  --   PLT 273  < > 369 336  < > 173 189 188  < > = values in this interval not displayed. Lab Results  Component Value Date   TSH 2.09 04/25/2015   Lab Results  Component Value Date   HGBA1C 5.9* 12/15/2014   Lab Results  Component Value Date   CHOL 93 12/07/2014   HDL 36* 12/07/2014   LDLCALC 47 12/07/2014   TRIG 51 12/07/2014   CHOLHDL 2.6 12/07/2014    Significant Diagnostic Results in last 30 days:  No results found.  Assessment/Plan  CAD (coronary artery disease) of artery bypass graft Stable, no angina since last visited.   Essential hypertension Controlled, continue Coreg 3.125mg  bid, Atorvastatin 40mg  daily for risk  reduction. 06/06/15 Na 140, K 3.9, bun 22, creat 1.36  PAF (paroxysmal atrial fibrillation) ([facility]) Heart rate is in control, continue Amiodarone 200mg  daily.   Chronic diastolic CHF (congestive heart failure) ([place]) 06/06/15 Na 140, K 3.9, bun 22, creat 1.36 Clinically compensated, continue Furosemide 40mg  daily.  Constipation Stable, continue MiraLax daily.  Hypothyroidism Continue Levothyroxine 76mcg daily, 01/31/15 TSH 4.6, 04/25/15 TSH 2.09  Alzheimer's disease [facility], SNF for care needs, baseline confusion.     Hereditary and idiopathic peripheral neuropathy 11/15/14 decrease Cellcept to 500mg  daily, then dc in 3 months-toxicity. 03/01/15 continue Cellcept 500mg  daily, f/u Neurology  Arthritis 06/10/15 X-ray R knee: mild osteoarthritis, no acute fracture or dislocation.  Chronic, the patient declined analgesics.   Chronic kidney disease 06/06/15 Na 140, K 3.9, Bun 22, creat 1.36 Furosemide contributory.  Benign prostatic hyperplasia No urinary retention. cotninueTamsulosin 0.8 mg daily.  Edema trace in ankles, continue Furosemide. 05/29/15 Na 136, K 4.5, Bun 23, creat 1.28  Anemia, iron deficiency 05/22/15 dc Fe, 05/27/15 Hgb 12.2, 06/20/15 Hgb 12.4      Abnormality of gait Frequent falls, lack of safety related to dementia and increased frailty due to comorbidity are contributory, close supervision needed.      Family/ staff Communication: continue SNF for care needs.   Labs/tests ordered: none

## 2015-07-24 NOTE — Assessment & Plan Note (Signed)
Heart rate is in control, continue Amiodarone 200mg daily.  

## 2015-07-24 NOTE — Assessment & Plan Note (Signed)
11/15/14 decrease Cellcept to 500mg  daily, then dc in 3 months-toxicity. 03/01/15 continue Cellcept 500mg  daily, f/u Neurology

## 2015-07-24 NOTE — Assessment & Plan Note (Signed)
Controlled, continue Coreg 3.125mg  bid, Atorvastatin 40mg  daily for risk reduction. 06/06/15 Na 140, K 3.9, bun 22, creat 1.36

## 2015-07-24 NOTE — Assessment & Plan Note (Signed)
trace in ankles, continue Furosemide. 05/29/15 Na 136, K 4.5, Bun 23, creat 1.28

## 2015-07-24 NOTE — Assessment & Plan Note (Signed)
Stable, continue MiraLax daily.  

## 2015-07-24 NOTE — Assessment & Plan Note (Signed)
Stable, no angina since last visited.  

## 2015-07-24 NOTE — Assessment & Plan Note (Signed)
06/06/15 Na 140, K 3.9, bun 22, creat 1.36 Clinically compensated, continue Furosemide 40mg  daily.

## 2015-07-24 NOTE — Assessment & Plan Note (Signed)
Off Namenda, SNF for care needs, baseline confusion.

## 2015-07-24 NOTE — Assessment & Plan Note (Signed)
06/06/15 Na 140, K 3.9, Bun 22, creat 1.36 Furosemide contributory.

## 2015-07-24 NOTE — Assessment & Plan Note (Signed)
06/10/15 X-ray R knee: mild osteoarthritis, no acute fracture or dislocation.  Chronic, the patient declined analgesics.

## 2015-07-24 NOTE — Assessment & Plan Note (Signed)
05/22/15 dc Fe, 05/27/15 Hgb 12.2, 06/20/15 Hgb 12.4

## 2015-07-25 DIAGNOSIS — H30103 Unspecified disseminated chorioretinal inflammation, bilateral: Secondary | ICD-10-CM | POA: Diagnosis not present

## 2015-07-25 DIAGNOSIS — Z961 Presence of intraocular lens: Secondary | ICD-10-CM | POA: Diagnosis not present

## 2015-07-25 DIAGNOSIS — H2512 Age-related nuclear cataract, left eye: Secondary | ICD-10-CM | POA: Diagnosis not present

## 2015-08-14 ENCOUNTER — Encounter: Payer: Self-pay | Admitting: Internal Medicine

## 2015-08-14 ENCOUNTER — Non-Acute Institutional Stay (SKILLED_NURSING_FACILITY): Payer: Medicare Other | Admitting: Internal Medicine

## 2015-08-14 DIAGNOSIS — H6191 Disorder of right external ear, unspecified: Secondary | ICD-10-CM

## 2015-08-14 NOTE — Progress Notes (Signed)
Location:   [facility] Room Number: [id number] Place of Service:  SNF (31) Provider:  Estill Dooms, MD  Patient Care Team: Estill Dooms, MD as PCP - General (Internal Medicine) Zebedee Iba, MD as Referring Physician (Ophthalmology) Darlin Coco, MD as Consulting Physician (Cardiology) Garvin Fila, MD as Consulting Physician (Neurology) Festus Aloe, MD as Consulting Physician (Urology) Marilynne Halsted, MD as Referring Physician (Ophthalmology) Lavonna Monarch, MD as Consulting Physician (Dermatology) Gerarda Fraction, MD as Referring Physician (Ophthalmology)  Extended Emergency Contact Information Primary Emergency Contact: Farr,Laura Address: [address] [place] [address] Johnnette Litter of [place] Phone: [phone] Mobile Phone: [phone] Relation: Daughter Secondary Emergency Contact: Genia Plants States of [place] Phone: [phone] Relation: Daughter   Goals of care: Advanced Directive information Advanced Directives 08/14/2015  Does patient have an advance directive? No  Would patient like information on creating an advanced directive? -     Chief Complaint  Patient presents with  . Acute Visit    small growth to right upper ear  . refused to eat    08/12/15 refused to eat breakfast and lunch    HPI:  Pt is a 82 y.o. male seen today for an acute visit for evaluation of a bleeding lesion of the right superior pinna. Present as a keratotic area for a long time, but he picks at it and it has started bleeding. He denies any pain or discomfort associated with this lesion.  Recently there were some problems with patient refusing meals, but he apparently has started eating better now.   Past Medical History  Diagnosis Date  . History of atrial fibrillation   . Thyroid disease   . Memory loss   . Stroke ([place])   . 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 Transient cerebral ischemia 07/04/2012    a posterior  circulation TIA in October 2009.    Marland Kitchen HTN (hypertension) 12/01/2013  . CAD (coronary artery disease) of artery bypass graft 12/02/2013  . Hypothyroidism 12/01/2013  . Peripheral autonomic neuropathy in disorders classified elsewhere(337.1) 07/04/2012  . 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 Hereditary and idiopathic peripheral neuropathy 07/04/2012    a history of chronic sensory polyneuropathy of undetermined origin with abnormality of gait. He is walking with a rolling walker. Last MRA of the head January 2013 shows no significant stenosis, MRA of the neck shows mild stenosis of the proximal left internal carotid artery.    . Abnormality of gait 07/04/2012  . Dizziness and giddiness 07/04/2012  . Benign prostatic hyperplasia   . Edema   . Chorioretinitis   . Panuveitis   . Internal carotid artery stenosis     left  . Mild left ventricular hypertrophy   . Actinic keratoses   . Diverticulitis   . Prostatitis, acute     1991  . Arthritis   . Neuromuscular disorder ([place])   . Essential hypertension   . Hypothyroidism   . BPH (benign prostatic hyperplasia)   . Retinitis   . HLD (hyperlipidemia)   . CAD (coronary artery disease) of bypass graft     a. s/p CABG 1991 without subsequent interventions.  . Chronic diastolic CHF (congestive heart failure) ([place])   . Hyperlipidemia   . Dementia   . Colon cancer ([place])     a. admitted to [facility] 12/07/14 with profound iron deficiency anemia Hgb 4.5, melena, with new diagnosis of invasive adenocarcinoma of colon  s/p lap R colectomy on 12/15/14.  . Iron deficiency anemia     a. 11/2014 - Hgb 4.5 in setting of newly dx colon CA.   Marland Kitchen PAF (paroxysmal atrial fibrillation) ([place])     a. Not on anticoag due to bleeding and fall risk.   Past Surgical History  Procedure Laterality Date  . Cardiac catheterization    . Coronary artery bypass graft  1991    grafts to LAD and RCA  . Appendectomy    . Coronary artery bypass graft  1991  .  Colonoscopy with propofol N/A 12/12/2014    Procedure: COLONOSCOPY WITH PROPOFOL;  Surgeon: Gatha Mayer, MD;  Location: [facility] ENDOSCOPY;  Service: Endoscopy;  Laterality: N/A;  . Colon resection N/A 12/15/2014    Procedure: Laparoscopic partial colectomy;  Surgeon: Autumn Messing III, MD;  Location: [facility] ORS;  Service: General;  Laterality: N/A;    Allergies  Allergen Reactions  . Sulfa Antibiotics Other (See Comments)    unknown  . Ativan [Lorazepam] Anxiety      Medication List       This list is accurate as of: 08/14/15 10:44 AM.  Always use your most recent med list.               acetaminophen 325 MG tablet  Commonly known as:  TYLENOL  Take 650 mg by mouth. Take two tablets every day. Take 2 tablets every 6 hours as needed for mild pain.     albuterol (2.5 MG/3ML) 0.083% nebulizer solution  Commonly known as:  PROVENTIL  Take 2.5 mg by nebulization every 6 (six) hours as needed for wheezing or shortness of breath.     amiodarone 200 MG tablet  Commonly known as:  PACERONE  Take 1 tablet (200 mg total) by mouth daily.     atorvastatin 40 MG tablet  Commonly known as:  LIPITOR  Take 40 mg by mouth at bedtime.     carvedilol 3.125 MG tablet  Commonly known as:  COREG  Take 1 tablet (3.125 mg total) by mouth 2 (two) times daily with a meal.     CEREFOLIN 07-19-48-5 MG Tabs  Take 1 tablet by mouth daily.     feeding supplement Liqd  Take 1 Container by mouth 2 (two) times daily between meals. 127MLs     FOLTANX 3-35-2 MG Tabs  Take by mouth. Take one tablet daily     food thickener Powd  Commonly known as:  THICK IT  Take by mouth as needed.     furosemide 20 MG tablet  Commonly known as:  LASIX  Take by mouth. Take 2 tablets = 40 mg daily.     Glucosamine-Chondroitin 750-600 MG Tabs  Take 2 tablets by mouth daily.     levothyroxine 88 MCG tablet  Commonly known as:  SYNTHROID, LEVOTHROID  Take 88 mcg by mouth daily before breakfast.     multivitamin with  minerals Tabs tablet  Take 1 tablet by mouth daily. CERTA-VITE SENIOR     mycophenolate 500 MG tablet  Commonly known as:  CELLCEPT  Take 500 mg by mouth daily.     polyethylene glycol packet  Commonly known as:  MIRALAX / GLYCOLAX  Take 17 g by mouth daily.     potassium chloride SA 20 MEQ tablet  Commonly known as:  K-DUR,KLOR-CON  Take 1 tablet (20 mEq total) by mouth daily.     tamsulosin 0.4 MG Caps capsule  Commonly known as:  FLOMAX  Take 0.4 mg by mouth. Take two capsules (0.8mg ) daily        Review of Systems  Constitutional: Negative for fever, chills and diaphoresis.  HENT: Positive for hearing loss. Negative for congestion and ear pain.   Eyes: Negative for pain, discharge and redness.  Respiratory: Negative for cough and shortness of breath.   Cardiovascular: Negative for chest pain, palpitations and leg swelling.  Gastrointestinal: Positive for constipation. Negative for nausea, vomiting, abdominal pain and diarrhea.  Genitourinary: Positive for frequency. Negative for dysuria and urgency.  Musculoskeletal: Positive for back pain, arthralgias and gait problem. Negative for myalgias and neck pain.       Ambulates with walker, w/c to go further. C/o right knee pain with walker, declined pain meds. Frequent falls.   Skin: Negative for rash.       Healed abd laparoscopic surgical scars.  Bleeding keratotic lesions of the superior aspect of the right pinna. Painless.  Neurological: Negative for dizziness, tremors, seizures, weakness and headaches.       Peripheral neuropathy  Psychiatric/Behavioral: Negative for suicidal ideas and hallucinations. The patient is not nervous/anxious.        Mood has been stabilized.     Immunization History  Administered Date(s) Administered  . Influenza-Unspecified 12/22/2013, 11/09/2014  . PPD Test 01/03/2015  . Pneumococcal Polysaccharide-23 12/02/1993  . Tdap 01/08/2013  . Zoster 12/03/2007   Pertinent  Health Maintenance  Due  Topic Date Due  . PNA vac Low Risk Adult (2 of 2 - PCV13) 12/03/1994  . INFLUENZA VACCINE  09/19/2015   Fall Risk  02/10/2015 06/27/2014  Falls in the past year? Yes No  Number falls in past yr: 1 -  Injury with Fall? No -  Risk for fall due to : History of fall(s) Impaired balance/gait  Follow up Falls evaluation completed -   Functional Status Survey:    Filed Vitals:   08/14/15 1020  BP: 128/90  Pulse: 83  Temp: 97.6 F (36.4 C)  Resp: 19  Height: 6' (1.829 m)  Weight: 193 lb (87.544 kg)   Body mass index is 26.17 kg/(m^2). Physical Exam  Constitutional: He appears well-developed and well-nourished. No distress.  HENT:  Head: Normocephalic and atraumatic.  Right Ear: External ear normal.  Left Ear: External ear normal.  Nose: Nose normal.  Mouth/Throat: Oropharynx is clear and moist. No oropharyngeal exudate.  Eyes: Conjunctivae and EOM are normal. Pupils are equal, round, and reactive to light. Right eye exhibits no discharge. Left eye exhibits no discharge. No scleral icterus.  Neck: Normal range of motion. Neck supple. No JVD present. No tracheal deviation present. No thyromegaly present.  Cardiovascular: Normal rate and regular rhythm.  Exam reveals no gallop and no friction rub.   Murmur heard. Systolic murmur [id number] right sternal border   Pulmonary/Chest: Effort normal and breath sounds normal. No stridor. No respiratory distress. He has no wheezes. He has no rales. He exhibits no tenderness.  Abdominal: Soft. Bowel sounds are normal. He exhibits no distension and no mass. There is no tenderness.  Musculoskeletal: Normal range of motion. He exhibits tenderness. He exhibits no edema.  R knee pain with walking, no erythema, swelling, patellar ballottement, or crepitus.   Neurological: He is alert. He has normal reflexes. No cranial nerve deficit. He exhibits normal muscle tone. Coordination normal.  Skin: Skin is warm and dry. No rash noted. He is not diaphoretic.  No erythema. No pallor.  Painless bleeding lesion in the superior aspect of the  right and left  Psychiatric: He has a normal mood and affect. Thought content normal. Cognition and memory are impaired. He exhibits abnormal recent memory. He exhibits normal remote memory.  Memory deficits.     Labs reviewed:  Recent Labs  12/20/14 0510 12/21/14 0500  01/31/15 04/25/15 05/19/15 1521  NA 137 136  < > 139 143 141  K 3.1* 3.2*  < > 4.2 3.7 4.3  CL 102 104  --   --   --  104  CO2 28 26  --   --   --  29  GLUCOSE 97 98  --   --   --  123*  BUN 27* 22*  < > 20 23* 25  CREATININE 1.16 1.11  < > 1.2 1.4* 1.40*  CALCIUM 8.4* 8.4*  --   --   --  9.1  MG 1.9  --   --   --   --   --   < > = values in this interval not displayed.  Recent Labs  12/06/14 12/07/14 0144 04/25/15  AST 16 21 18   ALT 14 20 13   ALKPHOS 66 79 70  BILITOT  --  0.9  --   PROT  --  5.8*  --   ALBUMIN  --  3.6  --     Recent Labs  12/07/14 0144  12/20/14 0510 12/21/14 0500  04/25/15 05/19/15 1521 06/20/15  WBC 4.3  < > 6.2 8.2  < > 4.2 4.1 4.4  NEUTROABS 2.7  --   --   --   --   --  2.5  --   HGB 4.9*  < > 9.3* 8.8*  < > 11.0* 12.3* 12.4*  HCT 16.5*  < > 30.1* 28.1*  < > 34* 37.6* 38*  MCV 77.5*  < > 79.4 77.8*  --   --  94.0  --   PLT 273  < > 369 336  < > 173 189 188  < > = values in this interval not displayed. Lab Results  Component Value Date   TSH 2.09 04/25/2015   Lab Results  Component Value Date   HGBA1C 5.9* 12/15/2014   Lab Results  Component Value Date   CHOL 93 12/07/2014   HDL 36* 12/07/2014   LDLCALC 47 12/07/2014   TRIG 51 12/07/2014   CHOLHDL 2.6 12/07/2014    Assessment/Plan 1. Lesion of right external ear -I recommended referral to dermatologist for removal of this lesion. It is possible that the base of the lesion is a squamous cell cancer, although I suspect most likely it is just an irritated seborrheic keratosis.

## 2015-08-16 DIAGNOSIS — H6191 Disorder of right external ear, unspecified: Secondary | ICD-10-CM | POA: Insufficient documentation

## 2015-08-28 ENCOUNTER — Non-Acute Institutional Stay (SKILLED_NURSING_FACILITY): Payer: Medicare Other | Admitting: Internal Medicine

## 2015-08-28 ENCOUNTER — Encounter: Payer: Self-pay | Admitting: Internal Medicine

## 2015-08-28 DIAGNOSIS — F0280 Dementia in other diseases classified elsewhere without behavioral disturbance: Secondary | ICD-10-CM

## 2015-08-28 DIAGNOSIS — H6191 Disorder of right external ear, unspecified: Secondary | ICD-10-CM

## 2015-08-28 DIAGNOSIS — I10 Essential (primary) hypertension: Secondary | ICD-10-CM | POA: Diagnosis not present

## 2015-08-28 DIAGNOSIS — I5032 Chronic diastolic (congestive) heart failure: Secondary | ICD-10-CM | POA: Diagnosis not present

## 2015-08-28 DIAGNOSIS — G308 Other Alzheimer's disease: Secondary | ICD-10-CM | POA: Diagnosis not present

## 2015-08-28 DIAGNOSIS — D509 Iron deficiency anemia, unspecified: Secondary | ICD-10-CM

## 2015-08-28 DIAGNOSIS — R609 Edema, unspecified: Secondary | ICD-10-CM | POA: Diagnosis not present

## 2015-08-28 DIAGNOSIS — N182 Chronic kidney disease, stage 2 (mild): Secondary | ICD-10-CM

## 2015-08-28 DIAGNOSIS — R269 Unspecified abnormalities of gait and mobility: Secondary | ICD-10-CM | POA: Diagnosis not present

## 2015-08-28 NOTE — Progress Notes (Signed)
Patient ID: Jesse Burton, male   DOB: Mar 02, 1925, 82 y.o.   MRN: [id number]  Location:   [facility] Room Number: [id number] Place of Service:  SNF (31) Provider: Estill Dooms, MD  Patient Care Team: Estill Dooms, MD as PCP - General (Internal Medicine) Zebedee Iba, MD as Referring Physician (Ophthalmology) Darlin Coco, MD as Consulting Physician (Cardiology) Garvin Fila, MD as Consulting Physician (Neurology) Festus Aloe, MD as Consulting Physician (Urology) Marilynne Halsted, MD as Referring Physician (Ophthalmology) Lavonna Monarch, MD as Consulting Physician (Dermatology) Gerarda Fraction, MD as Referring Physician (Ophthalmology)  Extended Emergency Contact Information Primary Emergency Contact: Farr,Laura Address: [address] [place] [address] Johnnette Litter of [place] Phone: [phone] Mobile Phone: [phone] Relation: Daughter Secondary Emergency Contact: Genia Plants States of [place] Phone: [phone] Relation: Daughter  Goals of care: Advanced Directive information Advanced Directives 08/28/2015  Does patient have an advance directive? (No Data)  Would patient like information on creating an advanced directive? -     Chief Complaint  Patient presents with  . Medical Management of Chronic Issues    medication management, routine    HPI:  Pt is a 82 y.o. male seen today for medical management of chronic diseases.    Patient was seen acutely recently for bleeding lesion in the right ear. He tells me his daughter's schedule appointment with the dermatologist for removal of this lesion.  Alzheimer's disease of other onset - unchanged  Abnormality of gait - high risk for falls. Wobbly on standing.  Anemia, iron deficiency - hemoglobin 12.4 on [id number]  Chronic diastolic CHF (congestive heart failure) (HCC) - compensated  Chronic kidney disease, stage 2 (mild) - stable  Edema, unspecified type -  improved  Essential hypertension - controlled     Past Medical History  Diagnosis Date  . History of atrial fibrillation   . Thyroid disease   . Memory loss   . Stroke ([place])   . 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 Transient cerebral ischemia 07/04/2012    a posterior circulation TIA in October 2009.    Marland Kitchen HTN (hypertension) 12/01/2013  . CAD (coronary artery disease) of artery bypass graft 12/02/2013  . Hypothyroidism 12/01/2013  . Peripheral autonomic neuropathy in disorders classified elsewhere(337.1) 07/04/2012  . 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 Hereditary and idiopathic peripheral neuropathy 07/04/2012    a history of chronic sensory polyneuropathy of undetermined origin with abnormality of gait. He is walking with a rolling walker. Last MRA of the head January 2013 shows no significant stenosis, MRA of the neck shows mild stenosis of the proximal left internal carotid artery.    . Abnormality of gait 07/04/2012  . Dizziness and giddiness 07/04/2012  . Benign prostatic hyperplasia   . Edema   . Chorioretinitis   . Panuveitis   . Internal carotid artery stenosis     left  . Mild left ventricular hypertrophy   . Actinic keratoses   . Diverticulitis   . Prostatitis, acute     1991  . Arthritis   . Neuromuscular disorder ([place])   . Essential hypertension   . Hypothyroidism   . BPH (benign prostatic hyperplasia)   . Retinitis   . HLD (hyperlipidemia)   . CAD (coronary artery disease) of bypass graft     a. s/p CABG 1991 without subsequent interventions.  . Chronic diastolic CHF (congestive heart failure) ([place])   .  Hyperlipidemia   . Dementia   . Colon cancer ([facility])     a. admitted to [facility] 12/07/14 with profound iron deficiency anemia Hgb 4.5, melena, with new diagnosis of invasive adenocarcinoma of colon s/p lap R colectomy on 12/15/14.  . Iron deficiency anemia     a. 11/2014 - Hgb 4.5 in setting of newly dx colon CA.   Marland Kitchen PAF (paroxysmal atrial  fibrillation) (HCC)     a. Not on anticoag due to bleeding and fall risk.   Past Surgical History  Procedure Laterality Date  . Cardiac catheterization    . Coronary artery bypass graft  1991    grafts to LAD and RCA  . Appendectomy    . Coronary artery bypass graft  1991  . Colonoscopy with propofol N/A 12/12/2014    Procedure: COLONOSCOPY WITH PROPOFOL;  Surgeon: Iva Boop, MD;  [facility];  Service: Endoscopy;  Laterality: N/A;  . Colon resection N/A 12/15/2014    Procedure: Laparoscopic partial colectomy;  Surgeon: Chevis Pretty III, MD;  Location: [facility];  Service: General;  Laterality: N/A;    Allergies  Allergen Reactions  . Sulfa Antibiotics Other (See Comments)    unknown  . Ativan [Lorazepam] Anxiety      Medication List       This list is accurate as of: 08/28/15  3:15 PM.  Always use your most recent med list.               acetaminophen 325 MG tablet  Commonly known as:  TYLENOL  Take 650 mg by mouth. Take two tablets every day. Take 2 tablets every 6 hours as needed for mild pain.     albuterol (2.5 MG/3ML) 0.083% nebulizer solution  Commonly known as:  PROVENTIL  Take 2.5 mg by nebulization every 6 (six) hours as needed for wheezing or shortness of breath.     amiodarone 200 MG tablet  Commonly known as:  PACERONE  Take 1 tablet (200 mg total) by mouth daily.     atorvastatin 40 MG tablet  Commonly known as:  LIPITOR  Take 40 mg by mouth at bedtime.     carvedilol 3.125 MG tablet  Commonly known as:  COREG  Take 1 tablet (3.125 mg total) by mouth 2 (two) times daily with a meal.     CEREFOLIN 07-19-48-5 MG Tabs  Take 1 tablet by mouth daily.     erythromycin ophthalmic ointment  Place 1 application into the right eye. Place one application right eye at bedtime     FOLTANX 3-35-2 MG Tabs  Take by mouth. Take one tablet daily     furosemide 20 MG tablet  Commonly known as:  LASIX  Take by mouth. Take 2 tablets = 40 mg daily.      Glucosamine-Chondroitin 750-600 MG Tabs  Take 2 tablets by mouth daily.     levothyroxine 88 MCG tablet  Commonly known as:  SYNTHROID, LEVOTHROID  Take 88 mcg by mouth daily before breakfast.     multivitamin with minerals Tabs tablet  Take 1 tablet by mouth daily. CERTA-VITE SENIOR     mycophenolate 500 MG tablet  Commonly known as:  CELLCEPT  Take 500 mg by mouth daily.     polyethylene glycol packet  Commonly known as:  MIRALAX / GLYCOLAX  Take 17 g by mouth daily.     potassium chloride SA 20 MEQ tablet  Commonly known as:  K-DUR,KLOR-CON  Take 1 tablet (20 mEq  total) by mouth daily.     tamsulosin 0.4 MG Caps capsule  Commonly known as:  FLOMAX  Take 0.4 mg by mouth. Take two capsules (0.8mg ) daily        Review of Systems  Constitutional: Negative for fever, chills and diaphoresis.  HENT: Positive for hearing loss. Negative for congestion and ear pain.   Eyes: Negative for pain, discharge and redness.  Respiratory: Negative for cough and shortness of breath.   Cardiovascular: Negative for chest pain, palpitations and leg swelling.  Gastrointestinal: Positive for constipation. Negative for nausea, vomiting, abdominal pain and diarrhea.  Genitourinary: Positive for frequency. Negative for dysuria and urgency.  Musculoskeletal: Positive for back pain, arthralgias and gait problem. Negative for myalgias and neck pain.       Ambulates with walker, w/c to go further. C/o right knee pain with walker, declined pain meds. Frequent falls.   Skin: Negative for rash.       Healed abd laparoscopic surgical scars.  Bleeding keratotic lesions of the superior aspect of the right pinna. Painless.  Neurological: Negative for dizziness, tremors, seizures, weakness and headaches.       Peripheral neuropathy  Psychiatric/Behavioral: Negative for suicidal ideas and hallucinations. The patient is not nervous/anxious.        Mood has been stabilized.     Immunization History    Administered Date(s) Administered  . Influenza-Unspecified 12/22/2013, 11/09/2014  . PPD Test 01/03/2015  . Pneumococcal Polysaccharide-23 12/02/1993  . Tdap 01/08/2013  . Zoster 12/03/2007   Pertinent  Health Maintenance Due  Topic Date Due  . PNA vac Low Risk Adult (2 of 2 - PCV13) 12/03/1994  . INFLUENZA VACCINE  09/19/2015   Fall Risk  02/10/2015 06/27/2014  Falls in the past year? Yes No  Number falls in past yr: 1 -  Injury with Fall? No -  Risk for fall due to : History of fall(s) Impaired balance/gait  Follow up Falls evaluation completed -     Filed Vitals:   08/28/15 1501  BP: 126/68  Pulse: 72  Temp: 97.2 F (36.2 C)  Resp: 20  Height: 6' (1.829 m)  Weight: 191 lb (86.637 kg)   Body mass index is 25.9 kg/(m^2). Physical Exam  Constitutional: He appears well-developed and well-nourished. No distress.  HENT:  Head: Normocephalic and atraumatic.  Right Ear: External ear normal.  Left Ear: External ear normal.  Nose: Nose normal.  Mouth/Throat: Oropharynx is clear and moist. No oropharyngeal exudate.  Eyes: Conjunctivae and EOM are normal. Pupils are equal, round, and reactive to light. Right eye exhibits no discharge. Left eye exhibits no discharge. No scleral icterus.  Neck: Normal range of motion. Neck supple. No JVD present. No tracheal deviation present. No thyromegaly present.  Cardiovascular: Normal rate and regular rhythm.  Exam reveals no gallop and no friction rub.   Murmur heard. Systolic murmur [id number] right sternal border   Pulmonary/Chest: Effort normal and breath sounds normal. No stridor. No respiratory distress. He has no wheezes. He has no rales. He exhibits no tenderness.  Abdominal: Soft. Bowel sounds are normal. He exhibits no distension and no mass. There is no tenderness.  Musculoskeletal: Normal range of motion. He exhibits tenderness. He exhibits no edema.  R knee pain with walking, no erythema, swelling, patellar ballottement, or  crepitus.   Neurological: He is alert. He has normal reflexes. No cranial nerve deficit. He exhibits normal muscle tone. Coordination normal.  Skin: Skin is warm and dry. No rash noted. He  is not diaphoretic. No erythema. No pallor.  Painless bleeding lesion in the superior aspect of the right and left  Psychiatric: He has a normal mood and affect. Thought content normal. Cognition and memory are impaired. He exhibits abnormal recent memory. He exhibits normal remote memory.  Memory deficits.     Labs reviewed:  Recent Labs  12/20/14 0510 12/21/14 0500  01/31/15 04/25/15 05/19/15 1521  NA 137 136  < > 139 143 141  K 3.1* 3.2*  < > 4.2 3.7 4.3  CL 102 104  --   --   --  104  CO2 28 26  --   --   --  29  GLUCOSE 97 98  --   --   --  123*  BUN 27* 22*  < > 20 23* 25  CREATININE 1.16 1.11  < > 1.2 1.4* 1.40*  CALCIUM 8.4* 8.4*  --   --   --  9.1  MG 1.9  --   --   --   --   --   < > = values in this interval not displayed.  Recent Labs  12/06/14 12/07/14 0144 04/25/15  AST 16 21 18   ALT 14 20 13   ALKPHOS 66 79 70  BILITOT  --  0.9  --   PROT  --  5.8*  --   ALBUMIN  --  3.6  --     Recent Labs  12/07/14 0144  12/20/14 0510 12/21/14 0500  04/25/15 05/19/15 1521 06/20/15  WBC 4.3  < > 6.2 8.2  < > 4.2 4.1 4.4  NEUTROABS 2.7  --   --   --   --   --  2.5  --   HGB 4.9*  < > 9.3* 8.8*  < > 11.0* 12.3* 12.4*  HCT 16.5*  < > 30.1* 28.1*  < > 34* 37.6* 38*  MCV 77.5*  < > 79.4 77.8*  --   --  94.0  --   PLT 273  < > 369 336  < > 173 189 188  < > = values in this interval not displayed. Lab Results  Component Value Date   TSH 2.09 04/25/2015   Lab Results  Component Value Date   HGBA1C 5.9* 12/15/2014   Lab Results  Component Value Date   CHOL 93 12/07/2014   HDL 36* 12/07/2014   LDLCALC 47 12/07/2014   TRIG 51 12/07/2014   CHOLHDL 2.6 12/07/2014    Assessment/Plan 1. Lesion of right external ear Patient is scheduled to see dermatologist for removal  2.  Alzheimer's disease of other onset Unchanged  3. Abnormality of gait High risk for falls  4. Anemia, iron deficiency Continue to monitor lab periodically  5. Chronic diastolic CHF (congestive heart failure) (HCC) Compensated  6. Chronic kidney disease, stage 2 (mild) Stable  7. Edema, unspecified type Improved  8. Essential hypertension controlled

## 2015-09-18 DIAGNOSIS — L570 Actinic keratosis: Secondary | ICD-10-CM | POA: Diagnosis not present

## 2015-09-22 DIAGNOSIS — R509 Fever, unspecified: Secondary | ICD-10-CM | POA: Diagnosis not present

## 2015-09-22 DIAGNOSIS — N186 End stage renal disease: Secondary | ICD-10-CM | POA: Diagnosis not present

## 2015-09-29 ENCOUNTER — Encounter: Payer: Self-pay | Admitting: Nurse Practitioner

## 2015-09-29 DIAGNOSIS — N390 Urinary tract infection, site not specified: Secondary | ICD-10-CM | POA: Insufficient documentation

## 2015-10-02 ENCOUNTER — Non-Acute Institutional Stay (SKILLED_NURSING_FACILITY): Payer: Medicare Other | Admitting: Nurse Practitioner

## 2015-10-02 ENCOUNTER — Encounter: Payer: Self-pay | Admitting: Nurse Practitioner

## 2015-10-02 DIAGNOSIS — G308 Other Alzheimer's disease: Secondary | ICD-10-CM

## 2015-10-02 DIAGNOSIS — I10 Essential (primary) hypertension: Secondary | ICD-10-CM | POA: Diagnosis not present

## 2015-10-02 DIAGNOSIS — I480 Paroxysmal atrial fibrillation: Secondary | ICD-10-CM | POA: Diagnosis not present

## 2015-10-02 DIAGNOSIS — H3090 Unspecified chorioretinal inflammation, unspecified eye: Secondary | ICD-10-CM

## 2015-10-02 DIAGNOSIS — I5032 Chronic diastolic (congestive) heart failure: Secondary | ICD-10-CM

## 2015-10-02 DIAGNOSIS — G909 Disorder of the autonomic nervous system, unspecified: Secondary | ICD-10-CM | POA: Diagnosis not present

## 2015-10-02 DIAGNOSIS — N400 Enlarged prostate without lower urinary tract symptoms: Secondary | ICD-10-CM

## 2015-10-02 DIAGNOSIS — N390 Urinary tract infection, site not specified: Secondary | ICD-10-CM

## 2015-10-02 DIAGNOSIS — D509 Iron deficiency anemia, unspecified: Secondary | ICD-10-CM | POA: Diagnosis not present

## 2015-10-02 DIAGNOSIS — E039 Hypothyroidism, unspecified: Secondary | ICD-10-CM | POA: Diagnosis not present

## 2015-10-02 DIAGNOSIS — R609 Edema, unspecified: Secondary | ICD-10-CM | POA: Diagnosis not present

## 2015-10-02 DIAGNOSIS — F0280 Dementia in other diseases classified elsewhere without behavioral disturbance: Secondary | ICD-10-CM

## 2015-10-02 DIAGNOSIS — K5900 Constipation, unspecified: Secondary | ICD-10-CM

## 2015-10-02 DIAGNOSIS — N182 Chronic kidney disease, stage 2 (mild): Secondary | ICD-10-CM

## 2015-10-02 DIAGNOSIS — G990 Autonomic neuropathy in diseases classified elsewhere: Secondary | ICD-10-CM

## 2015-10-02 NOTE — Progress Notes (Signed)
Location:   [facility] Room Number: 23 Place of Service:  SNF (31) Provider:  Marlana Latus  NP   Patient Care Team: Estill Dooms, MD as PCP - General (Internal Medicine) Zebedee Iba, MD as Referring Physician (Ophthalmology) Darlin Coco, MD as Consulting Physician (Cardiology) Garvin Fila, MD as Consulting Physician (Neurology) Festus Aloe, MD as Consulting Physician (Urology) Marilynne Halsted, MD as Referring Physician (Ophthalmology) Lavonna Monarch, MD as Consulting Physician (Dermatology) Gerarda Fraction, MD as Referring Physician (Ophthalmology) Man Otho Darner, NP as Nurse Practitioner (Internal Medicine)  Extended Emergency Contact Information Primary Emergency Contact: Farr,Laura Address: [address] [place] [address] Johnnette Litter of [place] Phone: [phone] Mobile Phone: [phone] Relation: Daughter Secondary Emergency Contact: Genia Plants [place] of [place] Phone: [phone] Relation: Daughter  Code Status:  Full Code Goals of care: Advanced Directive information Advanced Directives 10/02/2015  Does patient have an advance directive? No  Would patient like information on creating an advanced directive? -     Chief Complaint  Patient presents with  . Acute Visit    UTI    HPI:  Pt is a 82 y.o. male seen today for an acute visit for UTI   Hx of anemia, s/p partial colectomy.  CAD has been stable, no angina since last visit, hypothyroidism has been supplemented well with last TSH 4.6 01/31/15, HTN is controlled, his memory has been gradual decline but functioning well in SNF, off [facility], peripheral neuropathy has been stable, he walks with walker for short distance and w/c to go further.   Past Medical History:  Diagnosis Date  . Abnormality of gait 07/04/2012  . Actinic keratoses   . Arthritis   . Benign prostatic hyperplasia   . BPH (benign prostatic hyperplasia)   . CAD  (coronary artery disease) of artery bypass graft 12/02/2013  . CAD (coronary artery disease) of bypass graft    a. s/p CABG 1991 without subsequent interventions.  . Chorioretinitis   . Chronic diastolic CHF (congestive heart failure) ([place])   . Colon cancer ([place])    a. admitted to [facility] 12/07/14 with profound iron deficiency anemia Hgb 4.5, melena, with new diagnosis of invasive adenocarcinoma of colon s/p lap R colectomy on 12/15/14.  . Dementia   . Diverticulitis   . Dizziness and giddiness 07/04/2012  . Edema   . Essential hypertension   . Hereditary and idiopathic peripheral neuropathy 07/04/2012   a history of chronic sensory polyneuropathy of undetermined origin with abnormality of gait. He is walking with a rolling walker. Last MRA of the head January 2013 shows no significant stenosis, MRA of the neck shows mild stenosis of the proximal left internal carotid artery.    Marland Kitchen History of atrial fibrillation   . HLD (hyperlipidemia)   . HTN (hypertension) 12/01/2013  . Hyperlipidemia   . Hypothyroidism 12/01/2013  . Hypothyroidism   . 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 Internal carotid artery stenosis    left  . Iron deficiency anemia    a. 11/2014 - Hgb 4.5 in setting of newly dx colon CA.   . Memory loss   . Mild left ventricular hypertrophy   . Neuromuscular disorder ([place])   . PAF (paroxysmal atrial fibrillation) ([place])    a. Not on anticoag due to bleeding and fall risk.  . Panuveitis   . Peripheral autonomic neuropathy in disorders classified elsewhere(337.1) 07/04/2012  . Prostatitis,  acute    1991  . Retinitis   . Stroke ([facility])   . Thyroid disease   . Transient cerebral ischemia 07/04/2012   a posterior circulation TIA in October 2009.    . 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 APPENDECTOMY    . CARDIAC CATHETERIZATION    . COLON RESECTION N/A 12/15/2014   Procedure: Laparoscopic partial  colectomy;  Surgeon: Autumn Messing III, MD;  Location: [facility] ORS;  Service: General;  Laterality: N/A;  . COLONOSCOPY WITH PROPOFOL N/A 12/12/2014   Procedure: COLONOSCOPY WITH PROPOFOL;  Surgeon: Gatha Mayer, MD;  Location: [facility] ENDOSCOPY;  Service: Endoscopy;  Laterality: N/A;  . CORONARY ARTERY BYPASS GRAFT  1991   grafts to LAD and RCA  . CORONARY ARTERY BYPASS GRAFT  1991    Allergies  Allergen Reactions  . Sulfa Antibiotics Other (See Comments)    unknown  . Ativan [Lorazepam] Anxiety      Medication List       Accurate as of 10/02/15 11:59 PM. Always use your most recent med list.          acetaminophen 325 MG tablet Commonly known as:  TYLENOL Take 650 mg by mouth. Take two tablets every day. Take 2 tablets every 6 hours as needed for mild pain.   albuterol (2.5 MG/3ML) 0.083% nebulizer solution Commonly known as:  PROVENTIL Take 2.5 mg by nebulization every 6 (six) hours as needed for wheezing or shortness of breath.   amiodarone 200 MG tablet Commonly known as:  PACERONE Take 1 tablet (200 mg total) by mouth daily.   atorvastatin 40 MG tablet Commonly known as:  LIPITOR Take 40 mg by mouth at bedtime.   carvedilol 3.125 MG tablet Commonly known as:  COREG Take 1 tablet (3.125 mg total) by mouth 2 (two) times daily with a meal.   CEREFOLIN 07-19-48-5 MG Tabs Take 1 tablet by mouth daily.   erythromycin ophthalmic ointment Place 1 application into the right eye. Place one application right eye at bedtime   FOLTANX 3-35-2 MG Tabs Take by mouth. Take one tablet daily   furosemide 20 MG tablet Commonly known as:  LASIX Take by mouth. Take 2 tablets = 40 mg daily.   Glucosamine-Chondroitin 750-600 MG Tabs Take 2 tablets by mouth daily.   levothyroxine 88 MCG tablet Commonly known as:  SYNTHROID, LEVOTHROID Take 88 mcg by mouth daily before breakfast.   multivitamin with minerals Tabs tablet Take 1 tablet by mouth daily. CERTA-VITE SENIOR   nitrofurantoin  100 MG capsule Commonly known as:  MACRODANTIN Take 100 mg by mouth 2 (two) times daily.   polyethylene glycol packet Commonly known as:  MIRALAX / GLYCOLAX Take 17 g by mouth daily.   potassium chloride SA 20 MEQ tablet Commonly known as:  K-DUR,KLOR-CON Take 1 tablet (20 mEq total) by mouth daily.   saccharomyces boulardii 250 MG capsule Commonly known as:  FLORASTOR Take 250 mg by mouth 2 (two) times daily.   tamsulosin 0.4 MG Caps capsule Commonly known as:  FLOMAX Take by mouth. Take two capsules (0.8mg ) daily       Review of Systems  Constitutional: Negative for chills, diaphoresis and fever.  HENT: Positive for hearing loss. Negative for congestion and ear pain.   Eyes: Negative for pain, discharge and redness.  Respiratory: Negative for cough and shortness of breath.   Cardiovascular: Negative for chest pain, palpitations and leg swelling.  Gastrointestinal: Positive  for constipation. Negative for abdominal pain, diarrhea, nausea and vomiting.  Genitourinary: Positive for frequency. Negative for dysuria and urgency.  Musculoskeletal: Positive for arthralgias, back pain and gait problem. Negative for myalgias and neck pain.       Ambulates with walker, w/c to go further. C/o right knee pain with walker, declined pain meds. Frequent falls.   Skin: Negative for rash.       Healed abd laparoscopic surgical scars.  Bleeding keratotic lesions of the superior aspect of the right pinna. Painless.  Neurological: Negative for dizziness, tremors, seizures, weakness and headaches.       Peripheral neuropathy  Psychiatric/Behavioral: Negative for hallucinations and suicidal ideas. The patient is not nervous/anxious.        Mood has been stabilized.     Immunization History  Administered Date(s) Administered  . Influenza-Unspecified 12/22/2013, 11/09/2014  . PPD Test 01/03/2015  . Pneumococcal Polysaccharide-23 12/02/1993  . Tdap 01/08/2013  . Zoster 12/03/2007   Pertinent   Health Maintenance Due  Topic Date Due  . PNA vac Low Risk Adult (2 of 2 - PCV13) 12/03/1994  . INFLUENZA VACCINE  09/19/2015   Fall Risk  02/10/2015 06/27/2014  Falls in the past year? Yes No  Number falls in past yr: 1 -  Injury with Fall? No -  Risk for fall due to : History of fall(s) Impaired balance/gait  Follow up Falls evaluation completed -   Functional Status Survey:    Vitals:   10/02/15 1326  BP: 118/68  Pulse: 68  Resp: 20  Temp: 97.3 F (36.3 C)  Weight: 191 lb 3.2 oz (86.7 kg)  Height: 6' (1.829 m)   Body mass index is 25.93 kg/m. Physical Exam  Constitutional: He appears well-developed and well-nourished. No distress.  HENT:  Head: Normocephalic and atraumatic.  Right Ear: External ear normal.  Left Ear: External ear normal.  Nose: Nose normal.  Mouth/Throat: Oropharynx is clear and moist. No oropharyngeal exudate.  Eyes: Conjunctivae and EOM are normal. Pupils are equal, round, and reactive to light. Right eye exhibits no discharge. Left eye exhibits no discharge. No scleral icterus.  Neck: Normal range of motion. Neck supple. No JVD present. No tracheal deviation present. No thyromegaly present.  Cardiovascular: Normal rate and regular rhythm.  Exam reveals no gallop and no friction rub.   Murmur heard. Systolic murmur [id number] right sternal border   Pulmonary/Chest: Effort normal and breath sounds normal. No stridor. No respiratory distress. He has no wheezes. He has no rales. He exhibits no tenderness.  Abdominal: Soft. Bowel sounds are normal. He exhibits no distension and no mass. There is no tenderness.  Musculoskeletal: Normal range of motion. He exhibits tenderness. He exhibits no edema.  R knee pain with walking, no erythema, swelling, patellar ballottement, or crepitus.   Neurological: He is alert. He has normal reflexes. No cranial nerve deficit. He exhibits normal muscle tone. Coordination normal.  Skin: Skin is warm and dry. No rash noted. He is  not diaphoretic. No erythema. No pallor.  Painless bleeding lesion in the superior aspect of the right and left  Psychiatric: He has a normal mood and affect. Thought content normal. Cognition and memory are impaired. He exhibits abnormal recent memory. He exhibits normal remote memory.  Memory deficits.     Labs reviewed:  Recent Labs  12/20/14 0510 12/21/14 0500  04/25/15 05/19/15 1521 10/06/15  NA 137 136  < > 143 141 143  K 3.1* 3.2*  < > 3.7 4.3  4.2  CL 102 104  --   --  104  --   CO2 28 26  --   --  29  --   GLUCOSE 97 98  --   --  123*  --   BUN 27* 22*  < > 23* 25 23*  CREATININE 1.16 1.11  < > 1.4* 1.40* 1.3  CALCIUM 8.4* 8.4*  --   --  9.1  --   MG 1.9  --   --   --   --   --   < > = values in this interval not displayed.  Recent Labs  12/07/14 0144 04/25/15 10/06/15  AST 21 18 19   ALT 20 13 12   ALKPHOS 79 70 83  BILITOT 0.9  --   --   PROT 5.8*  --   --   ALBUMIN 3.6  --   --     Recent Labs  12/07/14 0144  12/20/14 0510 12/21/14 0500  05/19/15 1521 06/20/15 10/06/15  WBC 4.3  < > 6.2 8.2  < > 4.1 4.4 5.0  NEUTROABS 2.7  --   --   --   --  2.5  --   --   HGB 4.9*  < > 9.3* 8.8*  < > 12.3* 12.4* 12.3*  HCT 16.5*  < > 30.1* 28.1*  < > 37.6* 38* 37*  MCV 77.5*  < > 79.4 77.8*  --  94.0  --   --   PLT 273  < > 369 336  < > 189 188 182  < > = values in this interval not displayed. Lab Results  Component Value Date   TSH 8.20 (A) 10/06/2015   Lab Results  Component Value Date   HGBA1C 5.9 (H) 12/15/2014   Lab Results  Component Value Date   CHOL 93 12/07/2014   HDL 36 (L) 12/07/2014   LDLCALC 47 12/07/2014   TRIG 51 12/07/2014   CHOLHDL 2.6 12/07/2014    Significant Diagnostic Results in last 30 days:  No results found.  Assessment/Plan There are no diagnoses linked to this encounter.Essential hypertension Controlled, continue Coreg 3.125mg  bid, Atorvastatin 40mg  daily for risk reduction. 06/06/15 Na 140, K 3.9, bun 22, creat 1.36 Update CBC,  CMP, TSH  PAF (paroxysmal atrial fibrillation) ([facility]) Heart rate is in control, continue Amiodarone 200mg  daily.    Chronic diastolic CHF (congestive heart failure) ([facility]) 06/06/15 Na 140, K 3.9, bun 22, creat 1.36 Clinically compensated, continue Furosemide 40mg  daily.10/06/15 Hgb 12.3, Na 143, K 4.2, Bun 23, creat 1.29, bilirubin total 1.9, TP 5.6, albumin 3.7, TSH 8.20     Constipation Stable, continue MiraLax daily.   Hypothyroidism Continue Levothyroxine 59mcg daily, 01/31/15 TSH 4.6, 04/25/15 TSH 2.09, update TSH 10/06/15 Hgb 12.3, Na 143, K 4.2, Bun 23, creat 1.29, bilirubin total 1.9, TP 5.6, albumin 3.7, TSH 8.20 10/09/15 increase Levothyroxine to 168mcg daily, TSH 8 week.      Alzheimer's disease [facility], SNF for care needs, baseline confusion.    Peripheral autonomic neuropathy in disorders classified elsewhere Lower body weakness, ambulates with walker for short distance and w/c to go further.   Chronic kidney disease 06/06/15 Na 140, K 3.9, Bun 22, creat 1.36 Furosemide contributory. Update CMP  Chorioretinitis Using Cellcept per recommendation of DR. Korup at [facility]. 09/26/15 Celcept dc's Dr Manuella Ghazi Ophthalmology.    Anemia, iron deficiency 05/22/15 dc Fe, 05/27/15 Hgb 12.2, 06/20/15 Hgb 12.4, update CBC  Edema trace in ankles, continue Furosemide.  05/29/15 Na 136, K 4.5, Bun 23, creat 1.28 Update CMP   Urinary tract infection, site not specified 09/26/15 urine culture enterococcus, >100,000c/ml, 10 day course of Nitrofurantoin bid   Benign prostatic hyperplasia No urinary retention. cotninueTamsulosin 0.8 mg daily.      Family/ staff Communication: continue SNF for care assistance.   Labs/tests ordered:  CBC, CMP, TSH

## 2015-10-03 NOTE — Assessment & Plan Note (Signed)
Controlled, continue Coreg 3.125mg  bid, Atorvastatin 40mg  daily for risk reduction. 06/06/15 Na 140, K 3.9, bun 22, creat 1.36 Update CBC, CMP, TSH

## 2015-10-03 NOTE — Assessment & Plan Note (Signed)
Using Cellcept per recommendation of DR. Korup at [facility]. 09/26/15 Celcept dc's Dr Manuella Ghazi Ophthalmology.

## 2015-10-03 NOTE — Assessment & Plan Note (Signed)
06/06/15 Na 140, K 3.9, Bun 22, creat 1.36 Furosemide contributory. Update CMP

## 2015-10-03 NOTE — Assessment & Plan Note (Signed)
Lower body weakness, ambulates with walker for short distance and w/c to go further.

## 2015-10-03 NOTE — Assessment & Plan Note (Signed)
Stable, continue MiraLax daily.  

## 2015-10-03 NOTE — Assessment & Plan Note (Signed)
05/22/15 dc Fe, 05/27/15 Hgb 12.2, 06/20/15 Hgb 12.4, update CBC

## 2015-10-03 NOTE — Assessment & Plan Note (Addendum)
06/06/15 Na 140, K 3.9, bun 22, creat 1.36 Clinically compensated, continue Furosemide 40mg  daily.10/06/15 Hgb 12.3, Na 143, K 4.2, Bun 23, creat 1.29, bilirubin total 1.9, TP 5.6, albumin 3.7, TSH 8.20

## 2015-10-03 NOTE — Assessment & Plan Note (Addendum)
Continue Levothyroxine 21mcg daily, 01/31/15 TSH 4.6, 04/25/15 TSH 2.09, update TSH 10/06/15 Hgb 12.3, Na 143, K 4.2, Bun 23, creat 1.29, bilirubin total 1.9, TP 5.6, albumin 3.7, TSH 8.20 10/09/15 increase Levothyroxine to 176mcg daily, TSH 8 week.

## 2015-10-03 NOTE — Assessment & Plan Note (Signed)
Off Namenda, SNF for care needs, baseline confusion.

## 2015-10-03 NOTE — Assessment & Plan Note (Signed)
Heart rate is in control, continue Amiodarone 200mg daily.  

## 2015-10-03 NOTE — Assessment & Plan Note (Signed)
No urinary retention. cotninueTamsulosin 0.8 mg daily.  

## 2015-10-03 NOTE — Assessment & Plan Note (Signed)
09/26/15 urine culture enterococcus, >100,000c/ml, 10 day course of Nitrofurantoin bid

## 2015-10-03 NOTE — Assessment & Plan Note (Signed)
trace in ankles, continue Furosemide. 05/29/15 Na 136, K 4.5, Bun 23, creat 1.28 Update CMP

## 2015-10-06 DIAGNOSIS — F0280 Dementia in other diseases classified elsewhere without behavioral disturbance: Secondary | ICD-10-CM | POA: Diagnosis not present

## 2015-10-06 DIAGNOSIS — R4182 Altered mental status, unspecified: Secondary | ICD-10-CM | POA: Diagnosis not present

## 2015-10-06 LAB — CBC AND DIFFERENTIAL
HCT: 37 % — AB (ref 41–53)
HEMOGLOBIN: 12.3 g/dL — AB (ref 13.5–17.5)
PLATELETS: 182 10*3/uL (ref 150–399)
WBC: 5 10*3/mL

## 2015-10-06 LAB — BASIC METABOLIC PANEL
BUN: 23 mg/dL — AB (ref 4–21)
CREATININE: 1.3 mg/dL (ref <=1.3)
Glucose: 88 mg/dL
Potassium: 4.2 mmol/L (ref 3.4–5.3)
Sodium: 143 mmol/L (ref 137–147)

## 2015-10-06 LAB — HEPATIC FUNCTION PANEL
ALK PHOS: 83 U/L (ref 25–125)
ALT: 12 U/L (ref 10–40)
AST: 19 U/L (ref 14–40)
Bilirubin, Total: 1.9 mg/dL

## 2015-10-06 LAB — TSH: TSH: 8.2 u[IU]/mL — AB (ref <=5.90)

## 2015-10-09 ENCOUNTER — Other Ambulatory Visit: Payer: Self-pay | Admitting: *Deleted

## 2015-11-06 ENCOUNTER — Encounter: Payer: Self-pay | Admitting: Nurse Practitioner

## 2015-11-06 ENCOUNTER — Non-Acute Institutional Stay (SKILLED_NURSING_FACILITY): Payer: Medicare Other | Admitting: Nurse Practitioner

## 2015-11-06 DIAGNOSIS — G990 Autonomic neuropathy in diseases classified elsewhere: Secondary | ICD-10-CM | POA: Diagnosis not present

## 2015-11-06 DIAGNOSIS — N400 Enlarged prostate without lower urinary tract symptoms: Secondary | ICD-10-CM | POA: Diagnosis not present

## 2015-11-06 DIAGNOSIS — M1990 Unspecified osteoarthritis, unspecified site: Secondary | ICD-10-CM | POA: Diagnosis not present

## 2015-11-06 DIAGNOSIS — I480 Paroxysmal atrial fibrillation: Secondary | ICD-10-CM

## 2015-11-06 DIAGNOSIS — N182 Chronic kidney disease, stage 2 (mild): Secondary | ICD-10-CM

## 2015-11-06 DIAGNOSIS — K5900 Constipation, unspecified: Secondary | ICD-10-CM

## 2015-11-06 DIAGNOSIS — E039 Hypothyroidism, unspecified: Secondary | ICD-10-CM

## 2015-11-06 DIAGNOSIS — I25700 Atherosclerosis of coronary artery bypass graft(s), unspecified, with unstable angina pectoris: Secondary | ICD-10-CM | POA: Diagnosis not present

## 2015-11-06 DIAGNOSIS — R609 Edema, unspecified: Secondary | ICD-10-CM | POA: Diagnosis not present

## 2015-11-06 DIAGNOSIS — G309 Alzheimer's disease, unspecified: Secondary | ICD-10-CM

## 2015-11-06 DIAGNOSIS — F0280 Dementia in other diseases classified elsewhere without behavioral disturbance: Secondary | ICD-10-CM

## 2015-11-06 DIAGNOSIS — I10 Essential (primary) hypertension: Secondary | ICD-10-CM | POA: Diagnosis not present

## 2015-11-06 DIAGNOSIS — I5032 Chronic diastolic (congestive) heart failure: Secondary | ICD-10-CM

## 2015-11-06 DIAGNOSIS — D509 Iron deficiency anemia, unspecified: Secondary | ICD-10-CM

## 2015-11-06 NOTE — Assessment & Plan Note (Signed)
Furosemide contributory. 10/06/15 creat 1.29, 06/06/15 creat 1.36

## 2015-11-06 NOTE — Assessment & Plan Note (Signed)
trace in ankles, continue Furosemide.

## 2015-11-06 NOTE — Assessment & Plan Note (Signed)
Off Namenda, SNF for care needs, baseline confusion.

## 2015-11-06 NOTE — Assessment & Plan Note (Signed)
Stable, continue MiraLax daily.  

## 2015-11-06 NOTE — Assessment & Plan Note (Signed)
Hgb 12.3 10/06/15

## 2015-11-06 NOTE — Assessment & Plan Note (Signed)
Controlled, continue Coreg 3.125mg  bid, Atorvastatin 40mg  daily for risk reduction.

## 2015-11-06 NOTE — Assessment & Plan Note (Signed)
10/06/15 Hgb 12.3, Na 143, K 4.2, Bun 23, creat 1.29, bilirubin total 1.9, TP 5.6, albumin 3.7, TSH 8.20 10/09/15 increase Levothyroxine to 12mcg daily, TSH 8 week.

## 2015-11-06 NOTE — Assessment & Plan Note (Signed)
06/10/15 X-ray R knee: mild osteoarthritis, no acute fracture or dislocation.  Chronic, the patient declined analgesics.

## 2015-11-06 NOTE — Assessment & Plan Note (Signed)
Clinically compensated, continue Furosemide 40mg  daily.10/06/15 Hgb 12.3, Na 143, K 4.2, Bun 23, creat 1.29, bilirubin total 1.9, TP 5.6, albumin 3.7, TSH 8.20

## 2015-11-06 NOTE — Progress Notes (Signed)
Location:  [facility] Number: 23 Place of Service:  SNF (31) Provider: Marlana Latus NP  Patient Care Team: Estill Dooms, MD as PCP - General (Internal Medicine) Zebedee Iba, MD as Referring Physician (Ophthalmology) Darlin Coco, MD as Consulting Physician (Cardiology) Garvin Fila, MD as Consulting Physician (Neurology) Festus Aloe, MD as Consulting Physician (Urology) Marilynne Halsted, MD as Referring Physician (Ophthalmology) Lavonna Monarch, MD as Consulting Physician (Dermatology) Gerarda Fraction, MD as Referring Physician (Ophthalmology) Sahirah Rudell Otho Darner, NP as Nurse Practitioner (Internal Medicine)  Extended Emergency Contact Information Primary Emergency Contact: Farr,Laura Address: [address] [place] [address] Johnnette Litter of [place] Phone: [phone] Mobile Phone: [phone] Relation: Daughter Secondary Emergency Contact: Genia Plants [place] of [place] Phone: [phone] Relation: Daughter  Code Status: Full Code Goals of Care: Advanced Directive information Advanced Directives 11/06/2015  Does patient have an advance directive? No  Would patient like information on creating an advanced directive? No - patient declined information     Chief Complaint  Patient presents with  . Medical Management of Chronic Issues    HPI: Patient is a 82 y.o. male seen in today for an annual wellness exam.     Hx of anemia, s/p partial colectomy, last Hgb 12.3 10/06/15. CAD has been stable, no angina since last visit, hypothyroidism taking Levothyroxine 163mcg, last TSH 8.20, update TSH scheduled,  HTN is controlled, his memory has been gradual decline but functioning well in SNF, off [facility], peripheral neuropathy has been stable, he walks with walker for short distance and w/c to go further.  Depression screen [facility] 2/9 02/10/2015 06/27/2014  Decreased Interest 0 0  Down, Depressed, Hopeless 0 0  PHQ  - 2 Score 0 0    Fall Risk  02/10/2015 06/27/2014  Falls in the past year? Yes No  Number falls in past yr: 1 -  Injury with Fall? No -  Risk for fall due to : History of fall(s) Impaired balance/gait  Follow up Falls evaluation completed -   MMSE - Mini Mental State Exam 03/01/2014  Orientation to time 1  Orientation to Place 1  Registration 3  Attention/ Calculation 2  Recall 0  Language- name 2 objects 2  Language- repeat 1  Language- follow 3 step command 3  Language- read & follow direction 1  Write a sentence 0  Copy design 0  Total score 14     Health Maintenance  Topic Date Due  . PNA vac Low Risk Adult (2 of 2 - PCV13) 12/03/1994  . INFLUENZA VACCINE  09/19/2015  . TETANUS/TDAP  01/09/2023  . ZOSTAVAX  Completed    Urinary incontinence? Functional Status Survey:   Exercise? Diet? No exam data present Hearing:   Dentition: Pain:  Past Medical History:  Diagnosis Date  . Abnormality of gait 07/04/2012  . Actinic keratoses   . Arthritis   . Benign prostatic hyperplasia   . BPH (benign prostatic hyperplasia)   . CAD (coronary artery disease) of artery bypass graft 12/02/2013  . CAD (coronary artery disease) of bypass graft    a. s/p CABG 1991 without subsequent interventions.  . Chorioretinitis   . Chronic diastolic CHF (congestive heart failure) ([place])   . Colon cancer ([place])    a. admitted to [facility] 12/07/14 with profound iron deficiency anemia Hgb 4.5, melena, with new diagnosis of invasive adenocarcinoma of colon s/p lap R colectomy on 12/15/14.  Marland Kitchen  Dementia   . Diverticulitis   . Dizziness and giddiness 07/04/2012  . Edema   . Essential hypertension   . Hereditary and idiopathic peripheral neuropathy 07/04/2012   a history of chronic sensory polyneuropathy of undetermined origin with abnormality of gait. He is walking with a rolling walker. Last MRA of the head January 2013 shows no significant stenosis, MRA of the neck shows mild stenosis of the proximal  left internal carotid artery.    Marland Kitchen History of atrial fibrillation   . HLD (hyperlipidemia)   . HTN (hypertension) 12/01/2013  . Hyperlipidemia   . Hypothyroidism 12/01/2013  . Hypothyroidism   . 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 Internal carotid artery stenosis    left  . Iron deficiency anemia    a. 11/2014 - Hgb 4.5 in setting of newly dx colon CA.   . Memory loss   . Mild left ventricular hypertrophy   . Neuromuscular disorder ([place])   . PAF (paroxysmal atrial fibrillation) ([place])    a. Not on anticoag due to bleeding and fall risk.  . Panuveitis   . Peripheral autonomic neuropathy in disorders classified elsewhere(337.1) 07/04/2012  . Prostatitis, acute    1991  . Retinitis   . Stroke ([place])   . Thyroid disease   . Transient cerebral ischemia 07/04/2012   a posterior circulation TIA in October 2009.    . 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 APPENDECTOMY    . CARDIAC CATHETERIZATION    . COLON RESECTION N/A 12/15/2014   Procedure: Laparoscopic partial colectomy;  Surgeon: Autumn Messing III, MD;  Location: [facility] ORS;  Service: General;  Laterality: N/A;  . COLONOSCOPY WITH PROPOFOL N/A 12/12/2014   Procedure: COLONOSCOPY WITH PROPOFOL;  Surgeon: Gatha Mayer, MD;  Location: [facility] ENDOSCOPY;  Service: Endoscopy;  Laterality: N/A;  . CORONARY ARTERY BYPASS GRAFT  1991   grafts to LAD and RCA  . CORONARY ARTERY BYPASS GRAFT  1991    Family History  Problem Relation Age of Onset  . Pneumonia Mother   . Diabetes Mother   . Cancer Mother     colon  . Hypertension Mother   . Congestive Heart Failure Father   . Hypertension Father   . Coronary artery disease Father   . Heart attack Father   . Diabetes Father   . Heart failure Father   . Stroke Father   . Diabetes Sister   . Cancer Sister     breast  . Coronary artery disease Brother   . Heart disease Sister   . Diabetes Sister   . Kidney failure  Sister   . Heart attack Sister     Social History   Social History  . Marital status: Widowed    Spouse name: N/A  . Number of children: 3  . Years of education: Bachelor's   Occupational History  .  Retired   Social History Main Topics  . Smoking status: Former Smoker    Quit date: 12/03/1978  . Smokeless tobacco: Never Used  . Alcohol use No  . Drug use: No  . Sexual activity: Not Asked   Other Topics Concern  . None   Social History Narrative   ** Merged History Encounter **       Patient is retired from job of 38 years. Patient lives a lone. Pt is recently widowed after 59 years of marriage. Patient has 3 children. Patient drinks 2  cups caffeine a week, quit tobacco use in 1980 and has one drink per month.    Lives at [facility] since 11/26/2013 skill unit   Full Code    reports that he quit smoking about 36 years ago. He has never used smokeless tobacco. He reports that he does not drink alcohol or use drugs.   Allergies  Allergen Reactions  . Sulfa Antibiotics Other (See Comments)    unknown  . Ativan [Lorazepam] Anxiety      Medication List       Accurate as of 11/06/15  1:33 PM. Always use your most recent med list.          acetaminophen 325 MG tablet Commonly known as:  TYLENOL Take 650 mg by mouth. Take two tablets every day. Take 2 tablets every 6 hours as needed for mild pain.   albuterol (2.5 MG/3ML) 0.083% nebulizer solution Commonly known as:  PROVENTIL Take 2.5 mg by nebulization every 6 (six) hours as needed for wheezing or shortness of breath.   amiodarone 200 MG tablet Commonly known as:  PACERONE Take 1 tablet (200 mg total) by mouth daily.   atorvastatin 40 MG tablet Commonly known as:  LIPITOR Take 40 mg by mouth at bedtime.   carvedilol 3.125 MG tablet Commonly known as:  COREG Take 1 tablet (3.125 mg total) by mouth 2 (two) times daily with a meal.   CEREFOLIN 07-19-48-5 MG Tabs Take 1 tablet by mouth  daily.   erythromycin ophthalmic ointment Place 1 application into the right eye. Place one application right eye at bedtime   FOLTANX 3-35-2 MG Tabs Take by mouth. Take one tablet daily   furosemide 20 MG tablet Commonly known as:  LASIX Take by mouth. Take 2 tablets = 40 mg daily.   Glucosamine-Chondroitin 750-600 MG Tabs Take 2 tablets by mouth daily.   levothyroxine 112 MCG tablet Commonly known as:  SYNTHROID, LEVOTHROID Take 112 mcg by mouth daily before breakfast.   multivitamin with minerals Tabs tablet Take 1 tablet by mouth daily. CERTA-VITE SENIOR   polyethylene glycol packet Commonly known as:  MIRALAX / GLYCOLAX Take 17 g by mouth daily.   potassium chloride SA 20 MEQ tablet Commonly known as:  K-DUR,KLOR-CON Take 1 tablet (20 mEq total) by mouth daily.   tamsulosin 0.4 MG Caps capsule Commonly known as:  FLOMAX Take by mouth. Take two capsules (0.8mg ) daily        Review of Systems:  Review of Systems  Constitutional: Negative for chills, diaphoresis and fever.  HENT: Positive for hearing loss. Negative for congestion and ear pain.   Eyes: Negative for pain, discharge and redness.  Respiratory: Negative for cough and shortness of breath.   Cardiovascular: Negative for chest pain, palpitations and leg swelling.  Gastrointestinal: Positive for constipation. Negative for abdominal pain, diarrhea, nausea and vomiting.  Genitourinary: Positive for frequency. Negative for dysuria and urgency.  Musculoskeletal: Positive for arthralgias, back pain and gait problem. Negative for myalgias and neck pain.       Ambulates with walker, w/c to go further. C/o right knee pain with walker, declined pain meds. Frequent falls.   Skin: Negative for rash.       Healed abd laparoscopic surgical scars.  Bleeding keratotic lesions of the superior aspect of the right pinna. Painless.  Neurological: Negative for dizziness, tremors, seizures, weakness and headaches.        Peripheral neuropathy  Psychiatric/Behavioral: Negative for hallucinations and suicidal  ideas. The patient is not nervous/anxious.        Mood has been stabilized.     Physical Exam: Vitals:   11/06/15 1224  BP: (!) 146/76  Pulse: 66  Resp: 17  Temp: 98.5 F (36.9 C)  Weight: 191 lb 6.4 oz (86.8 kg)  Height: 6' (1.829 m)   Body mass index is 25.96 kg/m. Physical Exam  Constitutional: He appears well-developed and well-nourished. No distress.  HENT:  Head: Normocephalic and atraumatic.  Right Ear: External ear normal.  Left Ear: External ear normal.  Nose: Nose normal.  Mouth/Throat: Oropharynx is clear and moist. No oropharyngeal exudate.  Eyes: Conjunctivae and EOM are normal. Pupils are equal, round, and reactive to light. Right eye exhibits no discharge. Left eye exhibits no discharge. No scleral icterus.  Neck: Normal range of motion. Neck supple. No JVD present. No tracheal deviation present. No thyromegaly present.  Cardiovascular: Normal rate and regular rhythm.  Exam reveals no gallop and no friction rub.   Murmur heard. Systolic murmur [id number] right sternal border   Pulmonary/Chest: Effort normal and breath sounds normal. No stridor. No respiratory distress. He has no wheezes. He has no rales. He exhibits no tenderness.  Abdominal: Soft. Bowel sounds are normal. He exhibits no distension and no mass. There is no tenderness.  Musculoskeletal: Normal range of motion. He exhibits tenderness. He exhibits no edema.  R knee pain with walking, no erythema, swelling, patellar ballottement, or crepitus.   Neurological: He is alert. He has normal reflexes. No cranial nerve deficit. He exhibits normal muscle tone. Coordination normal.  Skin: Skin is warm and dry. No rash noted. He is not diaphoretic. No erythema. No pallor.  Painless bleeding lesion in the superior aspect of the right and left  Psychiatric: He has a normal mood and affect. Thought content normal. Cognition and  memory are impaired. He exhibits abnormal recent memory. He exhibits normal remote memory.  Memory deficits.     Labs reviewed: Basic Metabolic Panel:  Recent Labs  12/20/14 0510 12/21/14 0500  01/31/15 04/25/15 05/19/15 1521 10/06/15  NA 137 136  < > 139 143 141 143  K 3.1* 3.2*  < > 4.2 3.7 4.3 4.2  CL 102 104  --   --   --  104  --   CO2 28 26  --   --   --  29  --   GLUCOSE 97 98  --   --   --  123*  --   BUN 27* 22*  < > 20 23* 25 23*  CREATININE 1.16 1.11  < > 1.2 1.4* 1.40* 1.3  CALCIUM 8.4* 8.4*  --   --   --  9.1  --   MG 1.9  --   --   --   --   --   --   TSH  --   --   --  4.60 2.09  --  8.20*  < > = values in this interval not displayed. Liver Function Tests:  Recent Labs  12/07/14 0144 04/25/15 10/06/15  AST 21 18 19   ALT 20 13 12   ALKPHOS 79 70 83  BILITOT 0.9  --   --   PROT 5.8*  --   --   ALBUMIN 3.6  --   --    No results for input(s): LIPASE, AMYLASE in the last 8760 hours. No results for input(s): AMMONIA in the last 8760 hours. CBC:  Recent Labs  12/07/14 0144  12/20/14 0510 12/21/14 0500  05/19/15 1521 06/20/15 10/06/15  WBC 4.3  < > 6.2 8.2  < > 4.1 4.4 5.0  NEUTROABS 2.7  --   --   --   --  2.5  --   --   HGB 4.9*  < > 9.3* 8.8*  < > 12.3* 12.4* 12.3*  HCT 16.5*  < > 30.1* 28.1*  < > 37.6* 38* 37*  MCV 77.5*  < > 79.4 77.8*  --  94.0  --   --   PLT 273  < > 369 336  < > 189 188 182  < > = values in this interval not displayed. Lipid Panel:  Recent Labs  12/07/14 0423  CHOL 93  HDL 36*  LDLCALC 47  TRIG 51  CHOLHDL 2.6   Lab Results  Component Value Date   HGBA1C 5.9 (H) 12/15/2014    Procedures: No results found.  Assessment/Plan There are no diagnoses linked to this encounter.CAD (coronary artery disease) of artery bypass graft Stable, no angina since last visited.    Essential hypertension Controlled, continue Coreg 3.125mg  bid, Atorvastatin 40mg  daily for risk reduction.  PAF (paroxysmal atrial fibrillation)  (H[facility]) Heart rate is in control, continue Amiodarone 200mg  daily.   Chronic diastolic CHF (congestive heart failure) (H[facility]) Clinically compensated, continue Furosemide 40mg  daily.10/06/15 Hgb 12.3, Na 143, K 4.2, Bun 23, creat 1.29, bilirubin total 1.9, TP 5.6, albumin 3.7, TSH 8.20   Constipation Stable, continue MiraLax daily.  Hypothyroidism 10/06/15 Hgb 12.3, Na 143, K 4.2, Bun 23, creat 1.29, bilirubin total 1.9, TP 5.6, albumin 3.7, TSH 8.20 10/09/15 increase Levothyroxine to 139mcg daily, TSH 8 week.    Alzheimer's disease [facility], SNF for care needs, baseline confusion.  Peripheral autonomic neuropathy in disorders classified elsewhere Lower body weakness, ambulates with walker for short distance and w/c to go further.   Arthritis 06/10/15 X-ray R knee: mild osteoarthritis, no acute fracture or dislocation.  Chronic, the patient declined analgesics.   Chronic kidney disease Furosemide contributory. 10/06/15 creat 1.29, 06/06/15 creat 1.36  Benign prostatic hyperplasia No urinary retention. cotninueTamsulosin 0.8 mg daily.  Edema trace in ankles, continue Furosemide.   Anemia, iron deficiency Hgb 12.3 10/06/15     Labs/tests ordered: none

## 2015-11-06 NOTE — Assessment & Plan Note (Signed)
No urinary retention. cotninueTamsulosin 0.8 mg daily.  

## 2015-11-06 NOTE — Assessment & Plan Note (Signed)
Lower body weakness, ambulates with walker for short distance and w/c to go further.

## 2015-11-06 NOTE — Assessment & Plan Note (Signed)
Heart rate is in control, continue Amiodarone 200mg daily.  

## 2015-11-06 NOTE — Assessment & Plan Note (Signed)
Stable, no angina since last visited.  

## 2015-11-18 NOTE — Progress Notes (Signed)
Cardiology Office Note    Date:  11/21/2015   ID:  Jesse Burton, DOB 09/02/1925, MRN [id number]  PCP:  Jeanmarie Hubert, MD  Cardiologist:  Dr. Johnsie Cancel  CC: 6 mo f/u  History of Present Illness:  Jesse Burton is a 82 y.o. male with a history of CAD (s/p CABG 1991 without subsequent interventions), chronic diastolic CHF, HTN, HLD, hypothyroidism, BPH,  Alzheimers disease with advanced dementia, recently diagnosed colon CA with iron deficiency anemia requiring PRBCs who presents for follow-up.   He was admitted to [facility] 12/07/14 with profound iron deficiency anemia Hgb 4.5, melena, with new diagnosis of invasive adenocarcinoma of colon s/p lap R colectomy on 12/15/14. Per notes, oncology was consulted with recs of no role in systemic chemo or radiation therapy. During his admission he initially had NSR/PAF per notes but towards the end of his stay, he had persistent AF. Rates were controlled with carvedilol and amiodarone. Plavix was discontinued (on this since CABG) and he was not felt to be a candidate for anticoagulation for AF given anemia and fall risk.   He was last seen by Dr. Johnsie Cancel 04/2015 for follow up. They discussed advanced directives and he recommended he be a DNR. He has advanced dementia and limited mobility.   He has been living at [facility].   Today he presents to clinic for follow up. No chest pain. No LE edema, orthopnea or PND. No palpitations. No dizziness or syncope. No falling. He gets around in a walker most the time but can walk with a wheelchair. He mostly complains of leg and back pain.    Past Medical History:  Diagnosis Date  . Abnormality of gait 07/04/2012  . Actinic keratoses   . Arthritis   . Benign prostatic hyperplasia   . BPH (benign prostatic hyperplasia)   . CAD (coronary artery disease) of artery bypass graft 12/02/2013  . CAD (coronary artery disease) of bypass graft    a. s/p CABG 1991 without subsequent interventions.  . Chorioretinitis   .  Chronic diastolic CHF (congestive heart failure) ([place])   . Colon cancer ([place])    a. admitted to [facility] 12/07/14 with profound iron deficiency anemia Hgb 4.5, melena, with new diagnosis of invasive adenocarcinoma of colon s/p lap R colectomy on 12/15/14.  . Dementia   . Diverticulitis   . Dizziness and giddiness 07/04/2012  . Edema   . Essential hypertension   . Hereditary and idiopathic peripheral neuropathy 07/04/2012   a history of chronic sensory polyneuropathy of undetermined origin with abnormality of gait. He is walking with a rolling walker. Last MRA of the head January 2013 shows no significant stenosis, MRA of the neck shows mild stenosis of the proximal left internal carotid artery.    Marland Kitchen History of atrial fibrillation   . HLD (hyperlipidemia)   . HTN (hypertension) 12/01/2013  . Hyperlipidemia   . Hypothyroidism 12/01/2013  . Hypothyroidism   . 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 Internal carotid artery stenosis    left  . Iron deficiency anemia    a. 11/2014 - Hgb 4.5 in setting of newly dx colon CA.   . Memory loss   . Mild left ventricular hypertrophy   . Neuromuscular disorder ([place])   . PAF (paroxysmal atrial fibrillation) ([place])    a. Not on anticoag due to bleeding and fall risk.  . Panuveitis   . Peripheral autonomic neuropathy in disorders classified elsewhere(337.1) 07/04/2012  . Prostatitis, acute  1991  . Retinitis   . Stroke ([place])   . Thyroid disease   . Transient cerebral ischemia 07/04/2012   a posterior circulation TIA in October 2009.    . 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 APPENDECTOMY    . CARDIAC CATHETERIZATION    . COLON RESECTION N/A 12/15/2014   Procedure: Laparoscopic partial colectomy;  Surgeon: Autumn Messing III, MD;  Location: [facility] ORS;  Service: General;  Laterality: N/A;  . COLONOSCOPY WITH PROPOFOL N/A 12/12/2014   Procedure: COLONOSCOPY WITH PROPOFOL;  Surgeon:  Gatha Mayer, MD;  Location: [facility] ENDOSCOPY;  Service: Endoscopy;  Laterality: N/A;  . CORONARY ARTERY BYPASS GRAFT  1991   grafts to LAD and RCA  . CORONARY ARTERY BYPASS GRAFT  1991    Current Medications: Outpatient Medications Prior to Visit  Medication Sig Dispense Refill  . acetaminophen (TYLENOL) 325 MG tablet Take 650 mg by mouth. Take two tablets every day. Take 2 tablets every 6 hours as needed for mild pain.    Marland Kitchen albuterol (PROVENTIL) (2.5 MG/3ML) 0.083% nebulizer solution Take 2.5 mg by nebulization every 6 (six) hours as needed for wheezing or shortness of breath.    Marland Kitchen amiodarone (PACERONE) 200 MG tablet Take 1 tablet (200 mg total) by mouth daily. 30 tablet 0  . atorvastatin (LIPITOR) 40 MG tablet Take 40 mg by mouth at bedtime.     . carvedilol (COREG) 3.125 MG tablet Take 1 tablet (3.125 mg total) by mouth 2 (two) times daily with a meal. 60 tablet 0  . erythromycin ophthalmic ointment Place 1 application into the right eye. Place one application right eye at bedtime    . furosemide (LASIX) 20 MG tablet Take by mouth. Take 2 tablets = 40 mg daily.    . Glucosamine-Chondroitin 750-600 MG TABS Take 2 tablets by mouth daily.    Marland Kitchen L-Methylfolate-B12-B6-B2 (CEREFOLIN) 07-19-48-5 MG TABS Take 1 tablet by mouth daily.    Marland Kitchen L-Methylfolate-B6-B12 (FOLTANX) 3-35-2 MG TABS Take by mouth. Take one tablet daily    . levothyroxine (SYNTHROID, LEVOTHROID) 112 MCG tablet Take 112 mcg by mouth daily before breakfast.    . Multiple Vitamin (MULTIVITAMIN WITH MINERALS) TABS tablet Take 1 tablet by mouth daily. CERTA-VITE SENIOR    . polyethylene glycol (MIRALAX / GLYCOLAX) packet Take 17 g by mouth daily.    . potassium chloride SA (K-DUR,KLOR-CON) 20 MEQ tablet Take 1 tablet (20 mEq total) by mouth daily. 20 tablet 0  . tamsulosin (FLOMAX) 0.4 MG CAPS capsule Take by mouth. Take two capsules (0.8mg ) daily     No facility-administered medications prior to visit.      Allergies:   Sulfa  antibiotics and Ativan [lorazepam]   Social History   Social History  . Marital status: Widowed    Spouse name: N/A  . Number of children: 3  . Years of education: Bachelor's   Occupational History  .  Retired   Social History Main Topics  . Smoking status: Former Smoker    Quit date: 12/03/1978  . Smokeless tobacco: Never Used  . Alcohol use No  . Drug use: No  . Sexual activity: Not Asked   Other Topics Concern  . None   Social History Narrative   ** Merged History Encounter **       Patient is retired from job of 38 years. Patient lives a lone. Pt is recently widowed after 36 years of marriage. Patient  has 3 children. Patient drinks 2 cups caffeine a week, quit tobacco use in 1980 and has one drink per month.    Lives at [facility] since 11/26/2013 skill unit   Full Code     Family History:  The patient's family history includes Cancer in his mother and sister; Congestive Heart Failure in his father; Coronary artery disease in his brother and father; Diabetes in his father, mother, sister, and sister; Heart attack in his father and sister; Heart disease in his sister; Heart failure in his father; Hypertension in his father and mother; Kidney failure in his sister; Pneumonia in his mother; Stroke in his father.     ROS:   Please see the history of present illness.    ROS All other systems reviewed and are negative.   PHYSICAL EXAM:   VS:  BP 118/70   Pulse 68   Ht 6' (1.829 m)   Wt 176 lb (79.8 kg)   BMI 23.87 kg/m    GEN: Well nourished, well developed, in no acute distress  HEENT: normal  Neck: no JVD, carotid bruits, or masses Cardiac: irreg irreg; no murmurs, rubs, or gallops,no edema  Respiratory:  clear to auscultation bilaterally, normal work of breathing GI: soft, nontender, nondistended, + BS MS: no deformity or atrophy  Skin: warm and dry, no rash Neuro:  Alert and Oriented x 3, Strength and sensation are intact Psych: euthymic mood,  full affect  Wt Readings from Last 3 Encounters:  11/21/15 176 lb (79.8 kg)  11/06/15 191 lb 6.4 oz (86.8 kg)  10/02/15 191 lb 3.2 oz (86.7 kg)      Studies/Labs Reviewed:   EKG:  EKG is ordered today.  The ekg ordered today demonstrates afib with IRBBB HR 68  Recent Labs: 12/17/2014: B Natriuretic Peptide 877.1 12/20/2014: Magnesium 1.9 10/06/2015: ALT 12; BUN 23; Creatinine 1.3; Hemoglobin 12.3; Platelets 182; Potassium 4.2; Sodium 143; TSH 8.20   Lipid Panel    Component Value Date/Time   CHOL 93 12/07/2014 0423   TRIG 51 12/07/2014 0423   HDL 36 (L) 12/07/2014 0423   CHOLHDL 2.6 12/07/2014 0423   VLDL 10 12/07/2014 0423   LDLCALC 47 12/07/2014 0423    Additional studies/ records that were reviewed today include:  2D ECHO 12/09/2014 LV EF: 60% -   65% Study Conclusions - Left ventricle: The cavity size was normal. There was moderate   concentric hypertrophy. Systolic function was normal. The   estimated ejection fraction was in the range of 60% to 65%. Wall   motion was normal; there were no regional wall motion   abnormalities. - Mitral valve: There was mild regurgitation. - Left atrium: The atrium was moderately to severely dilated.   Anterior-posterior dimension: 49 mm.   ASSESSMENT & PLAN:    CAD s/p CABG - no recent angina reported. Off antiplatelets due to severe anemia and bleeding. Continue beta blocker and statin therapy.  Persistent AF- continue rate control with amiodarone and carvedilol. He is asymptomatic. Not a candidate for anticoagulation due to anemia/bleeding issues. At this point we are using amiodarone more for rate control than rhythm control, given his tendency towards softer BP.  Chronic diastolic CHF - appears euvolemic. He should follow low sodium diet 2000mg  and fluid restrict to <2L per day total.   Iron deficiency anemia - followed by PCP. THis has been stable    Medication Adjustments/Labs and Tests Ordered: Current medicines are  reviewed at length with  the patient today.  Concerns regarding medicines are outlined above.  Medication changes, Labs and Tests ordered today are listed in the Patient Instructions below. Patient Instructions  Medication Instructions:  Your physician recommends that you continue on your current medications as directed. Please refer to the Current Medication list given to you today.   Labwork: None ordered  Testing/Procedures: None ordered  Follow-Up: Your physician wants you to follow-up in: [place] DR. Johnsie Cancel   You will receive a reminder letter in the mail two months in advance. If you don't receive a letter, please call our office to schedule the follow-up appointment.   Any Other Special Instructions Will Be Listed Below (If Applicable).     If you need a refill on your cardiac medications before your next appointment, please call your pharmacy.      Signed, Angelena Form, PA-C  11/21/2015 3:03 PM    [facility] [address], [place], [place]  [address] Phone: [phone]; Fax: [phone]

## 2015-11-21 ENCOUNTER — Encounter (INDEPENDENT_AMBULATORY_CARE_PROVIDER_SITE_OTHER): Payer: Self-pay

## 2015-11-21 ENCOUNTER — Ambulatory Visit (INDEPENDENT_AMBULATORY_CARE_PROVIDER_SITE_OTHER): Payer: Medicare Other | Admitting: Physician Assistant

## 2015-11-21 ENCOUNTER — Encounter: Payer: Self-pay | Admitting: Physician Assistant

## 2015-11-21 VITALS — BP 118/70 | HR 68 | Ht 72.0 in | Wt 176.0 lb

## 2015-11-21 DIAGNOSIS — I2510 Atherosclerotic heart disease of native coronary artery without angina pectoris: Secondary | ICD-10-CM

## 2015-11-21 DIAGNOSIS — I5032 Chronic diastolic (congestive) heart failure: Secondary | ICD-10-CM

## 2015-11-21 DIAGNOSIS — I10 Essential (primary) hypertension: Secondary | ICD-10-CM | POA: Diagnosis not present

## 2015-11-21 DIAGNOSIS — I4819 Other persistent atrial fibrillation: Secondary | ICD-10-CM

## 2015-11-21 DIAGNOSIS — I481 Persistent atrial fibrillation: Secondary | ICD-10-CM | POA: Diagnosis not present

## 2015-11-21 NOTE — Patient Instructions (Signed)
Medication Instructions:  Your physician recommends that you continue on your current medications as directed. Please refer to the Current Medication list given to you today.   Labwork: None ordered  Testing/Procedures: None ordered  Follow-Up: Your physician wants you to follow-up in: 6 MONTHS WITH DR. NISHAN  You will receive a reminder letter in the mail two months in advance. If you don't receive a letter, please call our office to schedule the follow-up appointment.    Any Other Special Instructions Will Be Listed Below (If Applicable).     If you need a refill on your cardiac medications before your next appointment, please call your pharmacy.   

## 2015-11-25 DIAGNOSIS — N390 Urinary tract infection, site not specified: Secondary | ICD-10-CM | POA: Diagnosis not present

## 2015-11-30 ENCOUNTER — Encounter: Payer: Self-pay | Admitting: Nurse Practitioner

## 2015-12-01 ENCOUNTER — Encounter: Payer: Self-pay | Admitting: Nurse Practitioner

## 2015-12-01 ENCOUNTER — Non-Acute Institutional Stay (SKILLED_NURSING_FACILITY): Payer: Medicare Other | Admitting: Nurse Practitioner

## 2015-12-01 DIAGNOSIS — E039 Hypothyroidism, unspecified: Secondary | ICD-10-CM

## 2015-12-01 DIAGNOSIS — G308 Other Alzheimer's disease: Secondary | ICD-10-CM

## 2015-12-01 DIAGNOSIS — N390 Urinary tract infection, site not specified: Secondary | ICD-10-CM

## 2015-12-01 DIAGNOSIS — I5032 Chronic diastolic (congestive) heart failure: Secondary | ICD-10-CM | POA: Diagnosis not present

## 2015-12-01 DIAGNOSIS — N400 Enlarged prostate without lower urinary tract symptoms: Secondary | ICD-10-CM | POA: Diagnosis not present

## 2015-12-01 DIAGNOSIS — D500 Iron deficiency anemia secondary to blood loss (chronic): Secondary | ICD-10-CM

## 2015-12-01 DIAGNOSIS — Z8679 Personal history of other diseases of the circulatory system: Secondary | ICD-10-CM | POA: Diagnosis not present

## 2015-12-01 DIAGNOSIS — K5900 Constipation, unspecified: Secondary | ICD-10-CM | POA: Diagnosis not present

## 2015-12-01 DIAGNOSIS — I10 Essential (primary) hypertension: Secondary | ICD-10-CM | POA: Diagnosis not present

## 2015-12-01 DIAGNOSIS — R609 Edema, unspecified: Secondary | ICD-10-CM | POA: Diagnosis not present

## 2015-12-01 DIAGNOSIS — I480 Paroxysmal atrial fibrillation: Secondary | ICD-10-CM | POA: Diagnosis not present

## 2015-12-01 DIAGNOSIS — N182 Chronic kidney disease, stage 2 (mild): Secondary | ICD-10-CM

## 2015-12-01 DIAGNOSIS — F0281 Dementia in other diseases classified elsewhere with behavioral disturbance: Secondary | ICD-10-CM

## 2015-12-01 NOTE — Assessment & Plan Note (Signed)
10/06/15 Hgb 12.3, Na 143, K 4.2, Bun 23, creat 1.29, bilirubin total 1.9, TP 5.6, albumin 3.7, TSH 8.20 10/09/15 increase Levothyroxine to 159mcg daily, TSH 8 week.

## 2015-12-01 NOTE — Assessment & Plan Note (Signed)
Stable, continue MiraLax daily.  

## 2015-12-01 NOTE — Assessment & Plan Note (Signed)
Controlled, continue Coreg 3.125mg  bid, Atorvastatin 40mg  daily for risk reduction.

## 2015-12-01 NOTE — Assessment & Plan Note (Signed)
Furosemide contributory. 10/06/15 creat 1.29, 06/06/15 creat 1.36

## 2015-12-01 NOTE — Assessment & Plan Note (Addendum)
race in ankles, continue Furosemide. 10/06/15 Hgb 12.3, Na 143, K 4.2, Bun 23, creat 1.29, bilirubin total 1.9, TP 5.6, albumin 3.7, TSH 8.20

## 2015-12-01 NOTE — Assessment & Plan Note (Signed)
Off Namenda, SNF for care needs, baseline confusion.

## 2015-12-01 NOTE — Assessment & Plan Note (Signed)
No urinary retention. cotninueTamsulosin 0.8 mg daily.  

## 2015-12-01 NOTE — Assessment & Plan Note (Signed)
Heart rate is in control, continue Amiodarone 200mg daily.  

## 2015-12-01 NOTE — Assessment & Plan Note (Signed)
11/25/15 urine culture Enterococcus 50,000-100,000c/ml, 7 day course Nitrofurantoin bid.

## 2015-12-01 NOTE — Assessment & Plan Note (Signed)
Clinically compensated, continue Furosemide 40mg  daily.10/06/15 Hgb 12.3, Na 143, K 4.2, Bun 23, creat 1.29, bilirubin total 1.9, TP 5.6, albumin 3.7, TSH 8.20

## 2015-12-01 NOTE — Progress Notes (Signed)
Location:  [facility] Room Number: 23 Place of Service:  SNF (31) Provider:  Dorann Davidson, Manxie  NP  Jeanmarie Hubert, MD  Patient Care Team: Estill Dooms, MD as PCP - General (Internal Medicine) Zebedee Iba, MD as Referring Physician (Ophthalmology) Darlin Coco, MD as Consulting Physician (Cardiology) Garvin Fila, MD as Consulting Physician (Neurology) Festus Aloe, MD as Consulting Physician (Urology) Marilynne Halsted, MD as Referring Physician (Ophthalmology) Lavonna Monarch, MD as Consulting Physician (Dermatology) Gerarda Fraction, MD as Referring Physician (Ophthalmology) Tiernan Millikin Otho Darner, NP as Nurse Practitioner (Internal Medicine)  Extended Emergency Contact Information Primary Emergency Contact: Farr,Laura Address: [address] [place] [address] Johnnette Litter of [place] Phone: [phone] Mobile Phone: [phone] Relation: Daughter Secondary Emergency Contact: Genia Plants States of [place] Phone: [phone] Relation: Daughter  Code Status:  Full Code Goals of care: Advanced Directive information Advanced Directives 12/01/2015  Does patient have an advance directive? No  Would patient like information on creating an advanced directive? No - patient declined information     Chief Complaint  Patient presents with  . Medical Management of Chronic Issues    HPI:  Pt is a 82 y.o. male seen today for medical management of chronic diseases.    11/25/15 urine culture Enterococcus 50,000-100,000c/ml, 7 day course Nitrofurantoin bid.     Hx of anemia, s/p partial colectomy, last Hgb 12.3 10/06/15. CAD has been stable, no angina since last visit, hypothyroidism taking Levothyroxine 160mcg, last TSH 8.20 10/06/15, update TSH scheduled,  HTN is controlled, his memory has been gradual decline but functioning well in SNF, off [facility], peripheral neuropathy has been stable, he walks with walker for short distance and  w/c to go further.  Past Medical History:  Diagnosis Date  . Abnormality of gait 07/04/2012  . Actinic keratoses   . Arthritis   . Benign prostatic hyperplasia   . BPH (benign prostatic hyperplasia)   . CAD (coronary artery disease) of artery bypass graft 12/02/2013  . CAD (coronary artery disease) of bypass graft    a. s/p CABG 1991 without subsequent interventions.  . Chorioretinitis   . Chronic diastolic CHF (congestive heart failure) ([place])   . Colon cancer ([place])    a. admitted to [facility] 12/07/14 with profound iron deficiency anemia Hgb 4.5, melena, with new diagnosis of invasive adenocarcinoma of colon s/p lap R colectomy on 12/15/14.  . Dementia   . Diverticulitis   . Dizziness and giddiness 07/04/2012  . Edema   . Essential hypertension   . Hereditary and idiopathic peripheral neuropathy 07/04/2012   a history of chronic sensory polyneuropathy of undetermined origin with abnormality of gait. He is walking with a rolling walker. Last MRA of the head January 2013 shows no significant stenosis, MRA of the neck shows mild stenosis of the proximal left internal carotid artery.    Marland Kitchen History of atrial fibrillation   . HLD (hyperlipidemia)   . HTN (hypertension) 12/01/2013  . Hyperlipidemia   . Hypothyroidism 12/01/2013  . Hypothyroidism   . 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 Internal carotid artery stenosis    left  . Iron deficiency anemia    a. 11/2014 - Hgb 4.5 in setting of newly dx colon CA.   . Memory loss   . Mild left ventricular hypertrophy   . Neuromuscular disorder ([place])   . PAF (paroxysmal atrial fibrillation) ([place])    a. Not on  anticoag due to bleeding and fall risk.  . Panuveitis   . Peripheral autonomic neuropathy in disorders classified elsewhere(337.1) 07/04/2012  . Prostatitis, acute    1991  . Retinitis   . Stroke ([place])   . Thyroid disease   . Transient cerebral ischemia 07/04/2012   a posterior circulation TIA in October 2009.    .  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 APPENDECTOMY    . CARDIAC CATHETERIZATION    . COLON RESECTION N/A 12/15/2014   Procedure: Laparoscopic partial colectomy;  Surgeon: Autumn Messing III, MD;  Location: [facility] ORS;  Service: General;  Laterality: N/A;  . COLONOSCOPY WITH PROPOFOL N/A 12/12/2014   Procedure: COLONOSCOPY WITH PROPOFOL;  Surgeon: Gatha Mayer, MD;  Location: [facility] ENDOSCOPY;  Service: Endoscopy;  Laterality: N/A;  . CORONARY ARTERY BYPASS GRAFT  1991   grafts to LAD and RCA  . CORONARY ARTERY BYPASS GRAFT  1991    Allergies  Allergen Reactions  . Sulfa Antibiotics Other (See Comments)    unknown  . Ativan [Lorazepam] Anxiety      Medication List       Accurate as of 12/01/15  2:10 PM. Always use your most recent med list.          acetaminophen 325 MG tablet Commonly known as:  TYLENOL Take 650 mg by mouth. Take two tablets every day. Take 2 tablets every 6 hours as needed for mild pain.   albuterol (2.5 MG/3ML) 0.083% nebulizer solution Commonly known as:  PROVENTIL Take 2.5 mg by nebulization every 6 (six) hours as needed for wheezing or shortness of breath.   amiodarone 200 MG tablet Commonly known as:  PACERONE Take 1 tablet (200 mg total) by mouth daily.   atorvastatin 40 MG tablet Commonly known as:  LIPITOR Take 40 mg by mouth at bedtime.   carvedilol 3.125 MG tablet Commonly known as:  COREG Take 1 tablet (3.125 mg total) by mouth 2 (two) times daily with a meal.   CEREFOLIN 07-19-48-5 MG Tabs Take 1 tablet by mouth daily.   erythromycin ophthalmic ointment Place 1 application into the right eye. Place one application right eye at bedtime   FOLTANX 3-35-2 MG Tabs Take by mouth. Take one tablet daily   furosemide 20 MG tablet Commonly known as:  LASIX Take by mouth. Take 2 tablets = 40 mg daily.   Glucosamine-Chondroitin 750-600 MG Tabs Take 2 tablets by mouth daily.     levothyroxine 112 MCG tablet Commonly known as:  SYNTHROID, LEVOTHROID Take 112 mcg by mouth daily before breakfast.   multivitamin with minerals Tabs tablet Take 1 tablet by mouth daily. CERTA-VITE SENIOR   polyethylene glycol packet Commonly known as:  MIRALAX / GLYCOLAX Take 17 g by mouth daily.   potassium chloride SA 20 MEQ tablet Commonly known as:  K-DUR,KLOR-CON Take 1 tablet (20 mEq total) by mouth daily.   tamsulosin 0.4 MG Caps capsule Commonly known as:  FLOMAX Take by mouth. Take two capsules (0.8mg ) daily       Review of Systems  Constitutional: Negative for chills, diaphoresis and fever.  HENT: Positive for hearing loss. Negative for congestion and ear pain.   Eyes: Negative for pain, discharge and redness.  Respiratory: Negative for cough and shortness of breath.   Cardiovascular: Negative for chest pain, palpitations and leg swelling.  Gastrointestinal: Positive for constipation. Negative for abdominal pain, diarrhea, nausea and vomiting.  Genitourinary: Positive for frequency.  Negative for dysuria and urgency.  Musculoskeletal: Positive for arthralgias, back pain and gait problem. Negative for myalgias and neck pain.       Ambulates with walker, w/c to go further. C/o right knee pain with walker, declined pain meds. Frequent falls.   Skin: Negative for rash.       Healed abd laparoscopic surgical scars.  Bleeding keratotic lesions of the superior aspect of the right pinna. Painless.  Neurological: Negative for dizziness, tremors, seizures, weakness and headaches.       Peripheral neuropathy  Psychiatric/Behavioral: Negative for hallucinations and suicidal ideas. The patient is not nervous/anxious.        Mood has been stabilized.     Immunization History  Administered Date(s) Administered  . Influenza-Unspecified 12/22/2013, 11/09/2014  . PPD Test 01/03/2015  . Pneumococcal Polysaccharide-23 12/02/1993  . Tdap 01/08/2013  . Zoster 12/03/2007    Pertinent  Health Maintenance Due  Topic Date Due  . PNA vac Low Risk Adult (2 of 2 - PCV13) 12/03/1994  . INFLUENZA VACCINE  09/19/2015   Fall Risk  02/10/2015 06/27/2014  Falls in the past year? Yes No  Number falls in past yr: 1 -  Injury with Fall? No -  Risk for fall due to : History of fall(s) Impaired balance/gait  Follow up Falls evaluation completed -   Functional Status Survey:    Vitals:   12/01/15 0930  BP: 120/70  Pulse: 70  Resp: 20  Temp: 97.5 F (36.4 C)  Weight: 190 lb 3.2 oz (86.3 kg)  Height: 6' (1.829 m)   Body mass index is 25.8 kg/m. Physical Exam  Constitutional: He appears well-developed and well-nourished. No distress.  HENT:  Head: Normocephalic and atraumatic.  Right Ear: External ear normal.  Left Ear: External ear normal.  Nose: Nose normal.  Mouth/Throat: Oropharynx is clear and moist. No oropharyngeal exudate.  Eyes: Conjunctivae and EOM are normal. Pupils are equal, round, and reactive to light. Right eye exhibits no discharge. Left eye exhibits no discharge. No scleral icterus.  Neck: Normal range of motion. Neck supple. No JVD present. No tracheal deviation present. No thyromegaly present.  Cardiovascular: Normal rate and regular rhythm.  Exam reveals no gallop and no friction rub.   Murmur heard. Systolic murmur [id number] right sternal border   Pulmonary/Chest: Effort normal and breath sounds normal. No stridor. No respiratory distress. He has no wheezes. He has no rales. He exhibits no tenderness.  Abdominal: Soft. Bowel sounds are normal. He exhibits no distension and no mass. There is no tenderness.  Musculoskeletal: Normal range of motion. He exhibits tenderness. He exhibits no edema.  R knee pain with walking, no erythema, swelling, patellar ballottement, or crepitus.   Neurological: He is alert. He has normal reflexes. No cranial nerve deficit. He exhibits normal muscle tone. Coordination normal.  Skin: Skin is warm and dry. No rash  noted. He is not diaphoretic. No erythema. No pallor.  Painless bleeding lesion in the superior aspect of the right and left  Psychiatric: He has a normal mood and affect. Thought content normal. Cognition and memory are impaired. He exhibits abnormal recent memory. He exhibits normal remote memory.  Memory deficits.     Labs reviewed:  Recent Labs  12/20/14 0510 12/21/14 0500  04/25/15 05/19/15 1521 10/06/15  NA 137 136  < > 143 141 143  K 3.1* 3.2*  < > 3.7 4.3 4.2  CL 102 104  --   --  104  --  CO2 28 26  --   --  29  --   GLUCOSE 97 98  --   --  123*  --   BUN 27* 22*  < > 23* 25 23*  CREATININE 1.16 1.11  < > 1.4* 1.40* 1.3  CALCIUM 8.4* 8.4*  --   --  9.1  --   MG 1.9  --   --   --   --   --   < > = values in this interval not displayed.  Recent Labs  12/07/14 0144 04/25/15 10/06/15  AST 21 18 19   ALT 20 13 12   ALKPHOS 79 70 83  BILITOT 0.9  --   --   PROT 5.8*  --   --   ALBUMIN 3.6  --   --     Recent Labs  12/07/14 0144  12/20/14 0510 12/21/14 0500  05/19/15 1521 06/20/15 10/06/15  WBC 4.3  < > 6.2 8.2  < > 4.1 4.4 5.0  NEUTROABS 2.7  --   --   --   --  2.5  --   --   HGB 4.9*  < > 9.3* 8.8*  < > 12.3* 12.4* 12.3*  HCT 16.5*  < > 30.1* 28.1*  < > 37.6* 38* 37*  MCV 77.5*  < > 79.4 77.8*  --  94.0  --   --   PLT 273  < > 369 336  < > 189 188 182  < > = values in this interval not displayed. Lab Results  Component Value Date   TSH 8.20 (A) 10/06/2015   Lab Results  Component Value Date   HGBA1C 5.9 (H) 12/15/2014   Lab Results  Component Value Date   CHOL 93 12/07/2014   HDL 36 (L) 12/07/2014   LDLCALC 47 12/07/2014   TRIG 51 12/07/2014   CHOLHDL 2.6 12/07/2014    Significant Diagnostic Results in last 30 days:  No results found.  Assessment/Plan Urinary tract infection 11/25/15 urine culture Enterococcus 50,000-100,000c/ml, 7 day course Nitrofurantoin bid.    Essential hypertension Controlled, continue Coreg 3.125mg  bid, Atorvastatin  40mg  daily for risk reduction.   PAF (paroxysmal atrial fibrillation) (HCC) Heart rate is in control, continue Amiodarone 200mg  daily.   Chronic diastolic CHF (congestive heart failure) (HCC) Clinically compensated, continue Furosemide 40mg  daily.10/06/15 Hgb 12.3, Na 143, K 4.2, Bun 23, creat 1.29, bilirubin total 1.9, TP 5.6, albumin 3.7, TSH 8.20    Constipation Stable, continue MiraLax daily.   Hypothyroidism 10/06/15 Hgb 12.3, Na 143, K 4.2, Bun 23, creat 1.29, bilirubin total 1.9, TP 5.6, albumin 3.7, TSH 8.20 10/09/15 increase Levothyroxine to 151mcg daily, TSH 8 week.    Alzheimer's disease [facility], SNF for care needs, baseline confusion.   Chronic kidney disease Furosemide contributory. 10/06/15 creat 1.29, 06/06/15 creat 1.36   Benign prostatic hyperplasia No urinary retention. cotninueTamsulosin 0.8 mg daily.   Anemia, iron deficiency Hgb 12.3 10/06/15, not on Fe   Edema race in ankles, continue Furosemide. 10/06/15 Hgb 12.3, Na 143, K 4.2, Bun 23, creat 1.29, bilirubin total 1.9, TP 5.6, albumin 3.7, TSH 8.20    History of atrial fibrillation Heart rate is in control, continue Amiodarone 200mg  daily.       Family/ staff Communication: continue SNF for care assistance.   Labs/tests ordered:  none

## 2015-12-01 NOTE — Assessment & Plan Note (Signed)
Hgb 12.3 10/06/15, not on Fe

## 2015-12-05 DIAGNOSIS — E039 Hypothyroidism, unspecified: Secondary | ICD-10-CM | POA: Diagnosis not present

## 2015-12-05 LAB — TSH: TSH: 3.44 u[IU]/mL (ref <=5.90)

## 2015-12-07 ENCOUNTER — Other Ambulatory Visit: Payer: Self-pay | Admitting: *Deleted

## 2015-12-22 DIAGNOSIS — N390 Urinary tract infection, site not specified: Secondary | ICD-10-CM | POA: Diagnosis not present

## 2015-12-25 ENCOUNTER — Non-Acute Institutional Stay (SKILLED_NURSING_FACILITY): Payer: Medicare Other | Admitting: Internal Medicine

## 2015-12-25 ENCOUNTER — Encounter: Payer: Self-pay | Admitting: Internal Medicine

## 2015-12-25 DIAGNOSIS — I5032 Chronic diastolic (congestive) heart failure: Secondary | ICD-10-CM | POA: Diagnosis not present

## 2015-12-25 DIAGNOSIS — R269 Unspecified abnormalities of gait and mobility: Secondary | ICD-10-CM

## 2015-12-25 DIAGNOSIS — D500 Iron deficiency anemia secondary to blood loss (chronic): Secondary | ICD-10-CM | POA: Diagnosis not present

## 2015-12-25 DIAGNOSIS — F0281 Dementia in other diseases classified elsewhere with behavioral disturbance: Secondary | ICD-10-CM | POA: Diagnosis not present

## 2015-12-25 DIAGNOSIS — N182 Chronic kidney disease, stage 2 (mild): Secondary | ICD-10-CM | POA: Diagnosis not present

## 2015-12-25 DIAGNOSIS — G308 Other Alzheimer's disease: Secondary | ICD-10-CM | POA: Diagnosis not present

## 2015-12-25 DIAGNOSIS — I10 Essential (primary) hypertension: Secondary | ICD-10-CM | POA: Diagnosis not present

## 2015-12-25 DIAGNOSIS — E039 Hypothyroidism, unspecified: Secondary | ICD-10-CM | POA: Diagnosis not present

## 2015-12-25 DIAGNOSIS — F02818 Dementia in other diseases classified elsewhere, unspecified severity, with other behavioral disturbance: Secondary | ICD-10-CM

## 2015-12-25 NOTE — Progress Notes (Signed)
Progress Note    Location:  [facility] Room Number: 23 Place of Service:  SNF [phone]) Provider:  Jeanmarie Hubert, MD  Patient Care Team: Estill Dooms, MD as PCP - General (Internal Medicine) Zebedee Iba, MD as Referring Physician (Ophthalmology) Darlin Coco, MD as Consulting Physician (Cardiology) Garvin Fila, MD as Consulting Physician (Neurology) Festus Aloe, MD as Consulting Physician (Urology) Marilynne Halsted, MD as Referring Physician (Ophthalmology) Lavonna Monarch, MD as Consulting Physician (Dermatology) Gerarda Fraction, MD as Referring Physician (Ophthalmology) Man Otho Darner, NP as Nurse Practitioner (Internal Medicine)  Extended Emergency Contact Information Primary Emergency Contact: Farr,Laura Address: [address] [place] [address] Johnnette Litter of [place] Phone: [phone] Mobile Phone: [phone] Relation: Daughter Secondary Emergency Contact: Genia Plants States of [place] Phone: [phone] Relation: Daughter  Code Status:  Full Code Goals of care: Advanced Directive information Advanced Directives 12/25/2015  Does patient have an advance directive? No  Would patient like information on creating an advanced directive? No - patient declined information     Chief Complaint  Patient presents with  . Medical Management of Chronic Issues    HPI:  Pt is a 82 y.o. male seen today for medical management of chronic diseases.    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  Abnormality of gait 07/04/2012  . Actinic keratoses   . Arthritis   . Benign prostatic hyperplasia   . BPH (benign prostatic hyperplasia)   . CAD (coronary artery disease) of artery bypass graft 12/02/2013  . CAD (coronary artery disease) of bypass graft    a. s/p CABG 1991 without subsequent interventions.  . Chorioretinitis   . Chronic diastolic CHF (congestive heart failure) ([place])   . Colon cancer ([place])    a. admitted to [facility] 12/07/14 with profound iron deficiency anemia Hgb 4.5, melena, with new diagnosis of invasive adenocarcinoma of colon s/p lap R colectomy on 12/15/14.  . Dementia   . Diverticulitis   . Dizziness and giddiness 07/04/2012  . Edema   . Essential hypertension   . Hereditary and idiopathic peripheral neuropathy 07/04/2012   a history of chronic sensory polyneuropathy of undetermined origin with abnormality of gait. He is walking with a rolling walker. Last MRA of the head January 2013 shows no significant stenosis, MRA of the neck shows mild stenosis of the proximal left internal carotid artery.    Marland Kitchen History of atrial fibrillation   . HLD (hyperlipidemia)   . HTN (hypertension) 12/01/2013  . Hyperlipidemia   . Hypothyroidism 12/01/2013  . Hypothyroidism   . 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 Internal carotid artery stenosis    left  . Iron deficiency anemia    a. 11/2014 - Hgb 4.5 in setting of newly dx colon CA.   . Memory loss   . Mild left ventricular hypertrophy   . Neuromuscular disorder ([place])   . PAF (paroxysmal atrial fibrillation) ([place])    a. Not on anticoag due to bleeding and fall risk.  Marland Kitchen  Panuveitis   . Peripheral autonomic neuropathy in disorders classified elsewhere(337.1) 07/04/2012  . Prostatitis, acute    1991  . Retinitis   . Stroke ([place])   . Thyroid disease   . Transient cerebral ischemia 07/04/2012   a posterior circulation TIA in October 2009.    . 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 APPENDECTOMY    . CARDIAC CATHETERIZATION    . COLON RESECTION N/A 12/15/2014   Procedure: Laparoscopic partial colectomy;  Surgeon: Autumn Messing III, MD;  Location: [facility]S;  Service: General;  Laterality: N/A;  . COLONOSCOPY WITH PROPOFOL N/A 12/12/2014   Procedure: COLONOSCOPY WITH PROPOFOL;  Surgeon: Gatha Mayer, MD;  Location: [facility];  Service: Endoscopy;  Laterality: N/A;  . CORONARY ARTERY BYPASS GRAFT  1991   grafts to LAD and RCA  . CORONARY ARTERY BYPASS GRAFT  1991    Allergies  Allergen Reactions  . Sulfa Antibiotics Other (See Comments)    unknown  . Ativan [Lorazepam] Anxiety      Medication List       Accurate as of 12/25/15  1:37 PM. Always use your most recent med list.          acetaminophen 325 MG tablet Commonly known as:  TYLENOL Take 650 mg by mouth. Take two tablets every day. Take 2 tablets every 6 hours as needed for mild pain.   albuterol (2.5 MG/3ML) 0.083% nebulizer solution Commonly known as:  PROVENTIL Take 2.5 mg by nebulization every 6 (six) hours as needed for wheezing or shortness of breath.   amiodarone 200 MG tablet Commonly known as:  PACERONE Take 1 tablet (200 mg total) by mouth daily.   atorvastatin 40 MG tablet Commonly known as:  LIPITOR Take 40 mg by mouth at bedtime.   carvedilol 3.125 MG tablet Commonly known as:  COREG Take 1 tablet (3.125 mg total) by mouth 2 (two) times daily with a meal.   CEREFOLIN 07-19-48-5 MG Tabs Take 1 tablet by mouth daily.   erythromycin ophthalmic ointment Place 1 application into the right eye. Place one application right eye at bedtime   FOLTANX 3-35-2 MG Tabs Take by mouth. Take one tablet daily   furosemide 20 MG tablet Commonly known as:  LASIX Take by mouth. Take 2 tablets = 40 mg daily.   Glucosamine-Chondroitin 750-600 MG Tabs Take 2 tablets by mouth daily.   levothyroxine 112 MCG tablet Commonly known as:  SYNTHROID, LEVOTHROID Take 112  mcg by mouth daily before breakfast.   multivitamin with minerals Tabs tablet Take 1 tablet by mouth daily. CERTA-VITE SENIOR   polyethylene glycol packet Commonly known as:  MIRALAX / GLYCOLAX Take 17 g by mouth daily.   potassium chloride SA 20 MEQ tablet Commonly known as:  K-DUR,KLOR-CON Take 1 tablet (20 mEq total) by mouth daily.   tamsulosin 0.4 MG Caps capsule Commonly known as:  FLOMAX Take by mouth. Take two capsules (0.8mg ) daily       Review of Systems  Constitutional: Negative for chills, diaphoresis and fever.  HENT: Positive for hearing loss. Negative for congestion and ear pain.   Eyes: Negative for pain, discharge and redness.  Respiratory: Negative for cough and shortness of breath.   Cardiovascular: Negative for chest pain, palpitations and leg swelling.  Gastrointestinal: Positive for constipation. Negative for abdominal pain, diarrhea, nausea and vomiting.  Genitourinary: Positive for frequency. Negative for dysuria and urgency.  Musculoskeletal: Positive for  arthralgias, back pain and gait problem. Negative for myalgias and neck pain.       Ambulates with walker, w/c to go further. C/o right knee pain with walker, declined pain meds. Frequent falls.   Skin: Negative for rash.       Healed abd laparoscopic surgical scars.  Bleeding keratotic lesions of the superior aspect of the right pinna. Painless.  Neurological: Negative for dizziness, tremors, seizures, weakness and headaches.       Peripheral neuropathy  Psychiatric/Behavioral: Positive for confusion and decreased concentration. Negative for hallucinations and suicidal ideas. The patient is not nervous/anxious.        Mood has been stabilized.     Immunization History  Administered Date(s) Administered  . Influenza-Unspecified 12/22/2013, 11/09/2014, 12/05/2015  . PPD Test 01/03/2015  . Pneumococcal Polysaccharide-23 12/02/1993  . Tdap 01/08/2013  . Zoster 12/03/2007   Pertinent  Health  Maintenance Due  Topic Date Due  . PNA vac Low Risk Adult (2 of 2 - PCV13) 12/03/1994  . INFLUENZA VACCINE  Completed   Fall Risk  02/10/2015 06/27/2014  Falls in the past year? Yes No  Number falls in past yr: 1 -  Injury with Fall? No -  Risk for fall due to : History of fall(s) Impaired balance/gait  Follow up Falls evaluation completed -    Vitals:   12/25/15 1329  BP: (!) 142/74  Pulse: 60  Resp: 18  Temp: (!) 96.2 F (35.7 C)  Weight: 190 lb 14.4 oz (86.6 kg)  Height: 6' (1.829 m)   Body mass index is 25.89 kg/m. Physical Exam  Constitutional: He appears well-developed and well-nourished. No distress.  HENT:  Head: Normocephalic and atraumatic.  Right Ear: External ear normal.  Left Ear: External ear normal.  Nose: Nose normal.  Mouth/Throat: Oropharynx is clear and moist. No oropharyngeal exudate.  Eyes: Conjunctivae and EOM are normal. Pupils are equal, round, and reactive to light. Right eye exhibits no discharge. Left eye exhibits no discharge. No scleral icterus.  Neck: Normal range of motion. Neck supple. No JVD present. No tracheal deviation present. No thyromegaly present.  Cardiovascular: Normal rate and regular rhythm.  Exam reveals no gallop and no friction rub.   Murmur heard. Systolic murmur [id number] right sternal border   Pulmonary/Chest: Effort normal and breath sounds normal. No stridor. No respiratory distress. He has no wheezes. He has no rales. He exhibits no tenderness.  Abdominal: Soft. Bowel sounds are normal. He exhibits no distension and no mass. There is no tenderness.  Musculoskeletal: Normal range of motion. He exhibits tenderness. He exhibits no edema.  R knee pain with walking, no erythema, swelling, patellar ballottement, or crepitus.   Neurological: He is alert. He has normal reflexes. No cranial nerve deficit. He exhibits normal muscle tone. Coordination normal.  Skin: Skin is warm and dry. No rash noted. He is not diaphoretic. No erythema.  No pallor.  Painless bleeding lesion in the superior aspect of the right and left  Psychiatric: He has a normal mood and affect. Thought content normal. Cognition and memory are impaired. He exhibits abnormal recent memory. He exhibits normal remote memory.  Memory deficits.     Labs reviewed:  Recent Labs  04/25/15 05/19/15 1521 10/06/15  NA 143 141 143  K 3.7 4.3 4.2  CL  --  104  --   CO2  --  29  --   GLUCOSE  --  123*  --   BUN 23* 25 23*  CREATININE  1.4* 1.40* 1.3  CALCIUM  --  9.1  --     Recent Labs  04/25/15 10/06/15  AST 18 19  ALT 13 12  ALKPHOS 70 83    Recent Labs  05/19/15 1521 06/20/15 10/06/15  WBC 4.1 4.4 5.0  NEUTROABS 2.5  --   --   HGB 12.3* 12.4* 12.3*  HCT 37.6* 38* 37*  MCV 94.0  --   --   PLT 189 188 182   Lab Results  Component Value Date   TSH 3.44 12/05/2015   Lab Results  Component Value Date   HGBA1C 5.9 (H) 12/15/2014   Lab Results  Component Value Date   CHOL 93 12/07/2014   HDL 36 (L) 12/07/2014   LDLCALC 47 12/07/2014   TRIG 51 12/07/2014   CHOLHDL 2.6 12/07/2014     Assessment/Plan 1. 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 Iron deficiency anemia due to chronic blood loss stable  3. Chronic diastolic CHF (congestive heart failure) (HCC) compensated  4. Abnormality of gait Continue to use the 4 wheel walker  5. Stage 2 chronic kidney disease controlled  6. Essential hypertension controlled  7. Hypothyroidism, unspecified type compensated

## 2015-12-26 DIAGNOSIS — E7800 Pure hypercholesterolemia, unspecified: Secondary | ICD-10-CM | POA: Diagnosis not present

## 2015-12-26 LAB — LIPID PANEL
Cholesterol: 95 mg/dL (ref 0–200)
HDL: 42 mg/dL (ref 35–70)
LDL Cholesterol: 46 mg/dL
LDl/HDL Ratio: 2.3
TRIGLYCERIDES: 37 mg/dL — AB (ref 40–160)

## 2015-12-28 ENCOUNTER — Other Ambulatory Visit: Payer: Self-pay | Admitting: *Deleted

## 2016-01-04 ENCOUNTER — Non-Acute Institutional Stay (SKILLED_NURSING_FACILITY): Payer: Medicare Other | Admitting: Nurse Practitioner

## 2016-01-04 ENCOUNTER — Encounter: Payer: Self-pay | Admitting: Nurse Practitioner

## 2016-01-04 DIAGNOSIS — K5900 Constipation, unspecified: Secondary | ICD-10-CM

## 2016-01-04 DIAGNOSIS — N182 Chronic kidney disease, stage 2 (mild): Secondary | ICD-10-CM

## 2016-01-04 DIAGNOSIS — I480 Paroxysmal atrial fibrillation: Secondary | ICD-10-CM

## 2016-01-04 DIAGNOSIS — G301 Alzheimer's disease with late onset: Secondary | ICD-10-CM

## 2016-01-04 DIAGNOSIS — F0281 Dementia in other diseases classified elsewhere with behavioral disturbance: Secondary | ICD-10-CM

## 2016-01-04 DIAGNOSIS — R609 Edema, unspecified: Secondary | ICD-10-CM

## 2016-01-04 DIAGNOSIS — N400 Enlarged prostate without lower urinary tract symptoms: Secondary | ICD-10-CM

## 2016-01-04 DIAGNOSIS — G609 Hereditary and idiopathic neuropathy, unspecified: Secondary | ICD-10-CM | POA: Diagnosis not present

## 2016-01-04 DIAGNOSIS — I5032 Chronic diastolic (congestive) heart failure: Secondary | ICD-10-CM

## 2016-01-04 DIAGNOSIS — D500 Iron deficiency anemia secondary to blood loss (chronic): Secondary | ICD-10-CM | POA: Diagnosis not present

## 2016-01-04 DIAGNOSIS — E039 Hypothyroidism, unspecified: Secondary | ICD-10-CM | POA: Diagnosis not present

## 2016-01-04 DIAGNOSIS — I10 Essential (primary) hypertension: Secondary | ICD-10-CM | POA: Diagnosis not present

## 2016-01-04 NOTE — Assessment & Plan Note (Signed)
Stable, continue MiraLax daily.  

## 2016-01-04 NOTE — Progress Notes (Signed)
Location:  [facility] Room Number: 23 Place of Service:  SNF (31) Provider:  Hindy Perrault, [place]  NP  Jeanmarie Hubert, MD  Patient Care Team: Estill Dooms, MD as PCP - General (Internal Medicine) Zebedee Iba, MD as Referring Physician (Ophthalmology) Darlin Coco, MD as Consulting Physician (Cardiology) Garvin Fila, MD as Consulting Physician (Neurology) Festus Aloe, MD as Consulting Physician (Urology) Marilynne Halsted, MD as Referring Physician (Ophthalmology) Lavonna Monarch, MD as Consulting Physician (Dermatology) Gerarda Fraction, MD as Referring Physician (Ophthalmology) Shanyn Preisler Otho Darner, NP as Nurse Practitioner (Internal Medicine)  Extended Emergency Contact Information Primary Emergency Contact: Farr,Laura Address: [address] [place] [address] Johnnette Litter of [place] Phone: [phone] Mobile Phone: [phone] Relation: Daughter Secondary Emergency Contact: Genia Plants [place] of [place] Phone: [phone] Relation: Daughter  Code Status:  Full Code Goals of care: Advanced Directive information Advanced Directives 01/04/2016  Does patient have an advance directive? No  Would patient like information on creating an advanced directive? No - patient declined information     Chief Complaint  Patient presents with  . Acute Visit    Having hallucinations- x3 incidents    HPI:  Pt is a 82 y.o. male seen today for an acute visit for hallucination x3, the patient has no recollection of events upon my visit. Denied chest pain, he is afebrile, no noted new focal neurological deficits.    Hx of anemia, s/p partial colectomy, last Hgb 12.3 10/06/15. CAD has been stable, no angina since last visit, hypothyroidism taking Levothyroxine 153mcg, last TSH 3.44 12/05/15. HTN is controlled, his memory has been gradual decline but functioning well in SNF, [facility], peripheral neuropathy has been stable, he walks  with walker for short distance and w/c to go further.  Past Medical History:  Diagnosis Date  . Abnormality of gait 07/04/2012  . Actinic keratoses   . Arthritis   . Benign prostatic hyperplasia   . BPH (benign prostatic hyperplasia)   . CAD (coronary artery disease) of artery bypass graft 12/02/2013  . CAD (coronary artery disease) of bypass graft    a. s/p CABG 1991 without subsequent interventions.  . Chorioretinitis   . Chronic diastolic CHF (congestive heart failure) ([place])   . Colon cancer ([place])    a. admitted to [facility] 12/07/14 with profound iron deficiency anemia Hgb 4.5, melena, with new diagnosis of invasive adenocarcinoma of colon s/p lap R colectomy on 12/15/14.  . Dementia   . Diverticulitis   . Dizziness and giddiness 07/04/2012  . Edema   . Essential hypertension   . Hereditary and idiopathic peripheral neuropathy 07/04/2012   a history of chronic sensory polyneuropathy of undetermined origin with abnormality of gait. He is walking with a rolling walker. Last MRA of the head January 2013 shows no significant stenosis, MRA of the neck shows mild stenosis of the proximal left internal carotid artery.    Marland Kitchen History of atrial fibrillation   . HLD (hyperlipidemia)   . HTN (hypertension) 12/01/2013  . Hyperlipidemia   . Hypothyroidism 12/01/2013  . Hypothyroidism   . 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 Internal carotid artery stenosis    left  . Iron deficiency anemia    a. 11/2014 - Hgb 4.5 in setting of newly dx colon CA.   . Memory loss   . Mild left ventricular hypertrophy   . Neuromuscular disorder ([place])   . PAF (paroxysmal  atrial fibrillation) ([place])    a. Not on anticoag due to bleeding and fall risk.  . Panuveitis   . Peripheral autonomic neuropathy in disorders classified elsewhere(337.1) 07/04/2012  . Prostatitis, acute    1991  . Retinitis   . Stroke ([place])   . Thyroid disease   . Transient cerebral ischemia 07/04/2012   a posterior circulation  TIA in October 2009.    . 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 APPENDECTOMY    . CARDIAC CATHETERIZATION    . COLON RESECTION N/A 12/15/2014   Procedure: Laparoscopic partial colectomy;  Surgeon: Autumn Messing III, MD;  Location: [facility] ORS;  Service: General;  Laterality: N/A;  . COLONOSCOPY WITH PROPOFOL N/A 12/12/2014   Procedure: COLONOSCOPY WITH PROPOFOL;  Surgeon: Gatha Mayer, MD;  Location: [facility] ENDOSCOPY;  Service: Endoscopy;  Laterality: N/A;  . CORONARY ARTERY BYPASS GRAFT  1991   grafts to LAD and RCA  . CORONARY ARTERY BYPASS GRAFT  1991    Allergies  Allergen Reactions  . Sulfa Antibiotics Other (See Comments)    unknown  . Ativan [Lorazepam] Anxiety      Medication List       Accurate as of 01/04/16  1:02 PM. Always use your most recent med list.          acetaminophen 325 MG tablet Commonly known as:  TYLENOL Take 650 mg by mouth. Take two tablets every day. Take 2 tablets every 6 hours as needed for mild pain.   albuterol (2.5 MG/3ML) 0.083% nebulizer solution Commonly known as:  PROVENTIL Take 2.5 mg by nebulization every 6 (six) hours as needed for wheezing or shortness of breath.   amiodarone 200 MG tablet Commonly known as:  PACERONE Take 1 tablet (200 mg total) by mouth daily.   atorvastatin 40 MG tablet Commonly known as:  LIPITOR Take 40 mg by mouth at bedtime.   carvedilol 3.125 MG tablet Commonly known as:  COREG Take 1 tablet (3.125 mg total) by mouth 2 (two) times daily with a meal.   CEREFOLIN 07-19-48-5 MG Tabs Take 1 tablet by mouth daily.   erythromycin ophthalmic ointment Place 1 application into the right eye. Place one application right eye at bedtime   FOLTANX 3-35-2 MG Tabs Take by mouth. Take one tablet daily   furosemide 20 MG tablet Commonly known as:  LASIX Take by mouth. Take 2 tablets = 40 mg daily.   Glucosamine-Chondroitin 750-600 MG Tabs Take 2 tablets by  mouth daily.   levothyroxine 112 MCG tablet Commonly known as:  SYNTHROID, LEVOTHROID Take 112 mcg by mouth daily before breakfast.   multivitamin with minerals Tabs tablet Take 1 tablet by mouth daily. CERTA-VITE SENIOR   polyethylene glycol packet Commonly known as:  MIRALAX / GLYCOLAX Take 17 g by mouth daily.   potassium chloride SA 20 MEQ tablet Commonly known as:  K-DUR,KLOR-CON Take 1 tablet (20 mEq total) by mouth daily.   tamsulosin 0.4 MG Caps capsule Commonly known as:  FLOMAX Take by mouth. Take two capsules (0.8mg ) daily       Review of Systems  Constitutional: Negative for chills, diaphoresis and fever.  HENT: Positive for hearing loss. Negative for congestion and ear pain.   Eyes: Negative for pain, discharge and redness.  Respiratory: Negative for cough and shortness of breath.   Cardiovascular: Negative for chest pain, palpitations and leg swelling.  Gastrointestinal: Positive for constipation. Negative for abdominal pain, diarrhea,  nausea and vomiting.  Genitourinary: Positive for frequency. Negative for dysuria and urgency.  Musculoskeletal: Positive for arthralgias, back pain and gait problem. Negative for myalgias and neck pain.       Ambulates with walker, w/c to go further. C/o right knee pain with walker, declined pain meds. Frequent falls.   Skin: Negative for rash.       Healed abd laparoscopic surgical scars.  Bleeding keratotic lesions of the superior aspect of the right pinna. Painless.  Neurological: Negative for dizziness, tremors, seizures, weakness and headaches.       Peripheral neuropathy  Psychiatric/Behavioral: Positive for confusion and decreased concentration. Negative for hallucinations and suicidal ideas. The patient is not nervous/anxious.        Mood has been stabilized.     Immunization History  Administered Date(s) Administered  . Influenza-Unspecified 12/22/2013, 11/09/2014, 12/05/2015  . PPD Test 01/03/2015  . Pneumococcal  Polysaccharide-23 12/02/1993  . Tdap 01/08/2013  . Zoster 12/03/2007   Pertinent  Health Maintenance Due  Topic Date Due  . PNA vac Low Risk Adult (2 of 2 - PCV13) 12/03/1994  . INFLUENZA VACCINE  Completed   Fall Risk  02/10/2015 06/27/2014  Falls in the past year? Yes No  Number falls in past yr: 1 -  Injury with Fall? No -  Risk for fall due to : History of fall(s) Impaired balance/gait  Follow up Falls evaluation completed -   Functional Status Survey:    Vitals:   01/04/16 0929  BP: 129/72  Pulse: 66  Resp: 20  Temp: 98 F (36.7 C)  Weight: 186 lb 11.2 oz (84.7 kg)  Height: 6' (1.829 m)   Body mass index is 25.32 kg/m. Physical Exam  Constitutional: He appears well-developed and well-nourished. No distress.  HENT:  Head: Normocephalic and atraumatic.  Right Ear: External ear normal.  Left Ear: External ear normal.  Nose: Nose normal.  Mouth/Throat: Oropharynx is clear and moist. No oropharyngeal exudate.  Eyes: Conjunctivae and EOM are normal. Pupils are equal, round, and reactive to light. Right eye exhibits no discharge. Left eye exhibits no discharge. No scleral icterus.  Neck: Normal range of motion. Neck supple. No JVD present. No tracheal deviation present. No thyromegaly present.  Cardiovascular: Normal rate and regular rhythm.  Exam reveals no gallop and no friction rub.   Murmur heard. Systolic murmur [id number] right sternal border   Pulmonary/Chest: Effort normal and breath sounds normal. No stridor. No respiratory distress. He has no wheezes. He has no rales. He exhibits no tenderness.  Abdominal: Soft. Bowel sounds are normal. He exhibits no distension and no mass. There is no tenderness.  Musculoskeletal: Normal range of motion. He exhibits tenderness. He exhibits no edema.  R knee pain with walking, no erythema, swelling, patellar ballottement, or crepitus.   Neurological: He is alert. He has normal reflexes. No cranial nerve deficit. He exhibits normal  muscle tone. Coordination normal.  Skin: Skin is warm and dry. No rash noted. He is not diaphoretic. No erythema. No pallor.  Painless bleeding lesion in the superior aspect of the right and left  Psychiatric: He has a normal mood and affect. Thought content normal. Cognition and memory are impaired. He exhibits abnormal recent memory. He exhibits normal remote memory.  Memory deficits.     Labs reviewed:  Recent Labs  04/25/15 05/19/15 1521 10/06/15  NA 143 141 143  K 3.7 4.3 4.2  CL  --  104  --   CO2  --  29  --   GLUCOSE  --  123*  --   BUN 23* 25 23*  CREATININE 1.4* 1.40* 1.3  CALCIUM  --  9.1  --     Recent Labs  04/25/15 10/06/15  AST 18 19  ALT 13 12  ALKPHOS 70 83    Recent Labs  05/19/15 1521 06/20/15 10/06/15  WBC 4.1 4.4 5.0  NEUTROABS 2.5  --   --   HGB 12.3* 12.4* 12.3*  HCT 37.6* 38* 37*  MCV 94.0  --   --   PLT 189 188 182   Lab Results  Component Value Date   TSH 3.44 12/05/2015   Lab Results  Component Value Date   HGBA1C 5.9 (H) 12/15/2014   Lab Results  Component Value Date   CHOL 95 12/26/2015   HDL 42 12/26/2015   LDLCALC 46 12/26/2015   TRIG 37 (A) 12/26/2015   CHOLHDL 2.6 12/07/2014    Significant Diagnostic Results in last 30 days:  No results found.  Assessment/Plan Alzheimer's disease [facility], SNF for care needs, baseline confusion. Hallucination x3, continue to observe, update CBC, CMP, UA C/S  Essential hypertension Controlled, continue Coreg 3.125mg  bid, Atorvastatin 40mg  daily for risk reduction.    PAF (paroxysmal atrial fibrillation) ([facility]) Heart rate is in control, continue Amiodarone 200mg  daily.    Chronic diastolic CHF (congestive heart failure) ([facility]) Clinically compensated, continue Furosemide 40mg  daily  Constipation Stable, continue MiraLax daily.   Hypothyroidism 10/06/15 Hgb 12.3, Na 143, K 4.2, Bun 23, creat 1.29, bilirubin total 1.9, TP 5.6, albumin 3.7, TSH 8.20 10/09/15 increase  Levothyroxine to 143mcg daily 12/05/15 TSH 3.44   Hereditary and idiopathic peripheral neuropathy Lower body weakness, ambulates with walker for short distance and w/c to go further.   Chronic kidney disease Furosemide contributory. 10/06/15 creat 1.29, 06/06/15 creat 1.36   Benign prostatic hyperplasia No urinary retention. cotninueTamsulosin 0.8 mg daily.   Edema race in ankles, continue Furosemide.   Anemia, iron deficiency 10/06/15 Hgb 12.3     Family/ staff Communication: continue SNF  Labs/tests ordered: CBC CMP UA C/S

## 2016-01-04 NOTE — Assessment & Plan Note (Signed)
Furosemide contributory. 10/06/15 creat 1.29, 06/06/15 creat 1.36

## 2016-01-04 NOTE — Assessment & Plan Note (Signed)
Controlled, continue Coreg 3.125mg  bid, Atorvastatin 40mg  daily for risk reduction.

## 2016-01-04 NOTE — Assessment & Plan Note (Signed)
10/06/15 Hgb 12.3

## 2016-01-04 NOTE — Assessment & Plan Note (Signed)
Lower body weakness, ambulates with walker for short distance and w/c to go further.

## 2016-01-04 NOTE — Assessment & Plan Note (Signed)
[facility], SNF for care needs, baseline confusion. Hallucination x3, continue to observe, update CBC, CMP, UA C/S

## 2016-01-04 NOTE — Assessment & Plan Note (Signed)
10/06/15 Hgb 12.3, Na 143, K 4.2, Bun 23, creat 1.29, bilirubin total 1.9, TP 5.6, albumin 3.7, TSH 8.20 10/09/15 increase Levothyroxine to 169mcg daily 12/05/15 TSH 3.44

## 2016-01-04 NOTE — Assessment & Plan Note (Signed)
Heart rate is in control, continue Amiodarone 200mg daily.  

## 2016-01-04 NOTE — Assessment & Plan Note (Signed)
Clinically compensated, continue Furosemide 40mg  daily

## 2016-01-04 NOTE — Assessment & Plan Note (Signed)
No urinary retention. cotninueTamsulosin 0.8 mg daily.  

## 2016-01-04 NOTE — Assessment & Plan Note (Signed)
race in ankles, continue Furosemide.

## 2016-01-08 DIAGNOSIS — H52203 Unspecified astigmatism, bilateral: Secondary | ICD-10-CM | POA: Diagnosis not present

## 2016-01-08 DIAGNOSIS — H524 Presbyopia: Secondary | ICD-10-CM | POA: Diagnosis not present

## 2016-01-09 DIAGNOSIS — N390 Urinary tract infection, site not specified: Secondary | ICD-10-CM | POA: Diagnosis not present

## 2016-01-09 DIAGNOSIS — N182 Chronic kidney disease, stage 2 (mild): Secondary | ICD-10-CM | POA: Diagnosis not present

## 2016-01-16 DIAGNOSIS — I10 Essential (primary) hypertension: Secondary | ICD-10-CM | POA: Diagnosis not present

## 2016-01-16 LAB — CBC AND DIFFERENTIAL
HCT: 39 % — AB (ref 41–53)
Hemoglobin: 12.8 g/dL — AB (ref 13.5–17.5)
Platelets: 197 10*3/uL (ref 150–399)
WBC: 5.2 10^3/mL

## 2016-01-16 LAB — BASIC METABOLIC PANEL
BUN: 21 mg/dL (ref 4–21)
Creatinine: 1.4 mg/dL — AB (ref <=1.3)
GLUCOSE: 91 mg/dL
POTASSIUM: 3.9 mmol/L (ref 3.4–5.3)
SODIUM: 144 mmol/L (ref 137–147)

## 2016-01-16 LAB — HEPATIC FUNCTION PANEL
ALT: 13 U/L (ref 10–40)
AST: 21 U/L (ref 14–40)
Alkaline Phosphatase: 77 U/L (ref 25–125)
BILIRUBIN, TOTAL: 1.5 mg/dL

## 2016-01-17 ENCOUNTER — Other Ambulatory Visit: Payer: Self-pay | Admitting: *Deleted

## 2016-01-22 ENCOUNTER — Encounter: Payer: Self-pay | Admitting: Nurse Practitioner

## 2016-01-22 ENCOUNTER — Non-Acute Institutional Stay (SKILLED_NURSING_FACILITY): Payer: Medicare Other | Admitting: Nurse Practitioner

## 2016-01-22 DIAGNOSIS — K5900 Constipation, unspecified: Secondary | ICD-10-CM

## 2016-01-22 DIAGNOSIS — E039 Hypothyroidism, unspecified: Secondary | ICD-10-CM

## 2016-01-22 DIAGNOSIS — G301 Alzheimer's disease with late onset: Secondary | ICD-10-CM

## 2016-01-22 DIAGNOSIS — I480 Paroxysmal atrial fibrillation: Secondary | ICD-10-CM

## 2016-01-22 DIAGNOSIS — F0281 Dementia in other diseases classified elsewhere with behavioral disturbance: Secondary | ICD-10-CM | POA: Diagnosis not present

## 2016-01-22 DIAGNOSIS — N400 Enlarged prostate without lower urinary tract symptoms: Secondary | ICD-10-CM

## 2016-01-22 DIAGNOSIS — I10 Essential (primary) hypertension: Secondary | ICD-10-CM

## 2016-01-22 DIAGNOSIS — N390 Urinary tract infection, site not specified: Secondary | ICD-10-CM

## 2016-01-22 DIAGNOSIS — I5032 Chronic diastolic (congestive) heart failure: Secondary | ICD-10-CM | POA: Diagnosis not present

## 2016-01-22 NOTE — Assessment & Plan Note (Signed)
No urinary retention. cotninueTamsulosin 0.8 mg daily.  

## 2016-01-22 NOTE — Assessment & Plan Note (Signed)
10/09/15 increase Levothyroxine to 112mcg daily 12/05/15 TSH 3.44  

## 2016-01-22 NOTE — Assessment & Plan Note (Addendum)
C/o loose stools, 01/17/16 changed Miralax to prn, update BMP one week, may consider stool C diff toxin A/B check to r/o clostridium difficile colitis.

## 2016-01-22 NOTE — Assessment & Plan Note (Signed)
Heart rate is in control, continue Amiodarone 200mg daily.  

## 2016-01-22 NOTE — Assessment & Plan Note (Signed)
[facility], SNF for care needs, baseline confusion. Hallucination x3, continue to observe, MCU

## 2016-01-22 NOTE — Assessment & Plan Note (Signed)
11/25/15 urine culture Enterococcus 50,000-100,000c/ml, 7 day course Nitrofurantoin bid.  Last treated UTI 11/25/15

## 2016-01-22 NOTE — Assessment & Plan Note (Signed)
Clinically compensated, continue Furosemide 40mg  daily

## 2016-01-22 NOTE — Assessment & Plan Note (Signed)
Controlled, continue Coreg 3.125mg  bid, Atorvastatin 40mg  daily for risk reduction.

## 2016-01-22 NOTE — Progress Notes (Signed)
Location:  [facility] Room Number: 23 Place of Service:  SNF (31) Provider:  Analeise Mccleery, Manxie  NP  Jeanmarie Hubert, MD  Patient Care Team: Estill Dooms, MD as PCP - General (Internal Medicine) Zebedee Iba, MD as Referring Physician (Ophthalmology) Darlin Coco, MD as Consulting Physician (Cardiology) Garvin Fila, MD as Consulting Physician (Neurology) Festus Aloe, MD as Consulting Physician (Urology) Marilynne Halsted, MD as Referring Physician (Ophthalmology) Lavonna Monarch, MD as Consulting Physician (Dermatology) Gerarda Fraction, MD as Referring Physician (Ophthalmology) Cassie Shedlock Otho Darner, NP as Nurse Practitioner (Internal Medicine)  Extended Emergency Contact Information Primary Emergency Contact: Farr,Laura Address: [address] [place] [address] Johnnette Litter of [place] Phone: [phone] Mobile Phone: [phone] Relation: Daughter Secondary Emergency Contact: Genia Plants [place] of [place] Phone: [phone] Relation: Daughter  Code Status:  Full Code Goals of care: Advanced Directive information Advanced Directives 01/22/2016  Does Patient Have a Medical Advance Directive? No  Would patient like information on creating a medical advance directive? -     Chief Complaint  Patient presents with  . Acute Visit    increased soft semi loose stools in the evening    HPI:  Pt is a 82 y.o. male seen today for an acute visit for constipation, changed MiraLax to prn, denied abd pain, nausea, or vomiting. He is afebrile.     Hx of anemia, s/p partial colectomy, last Hgb 12.8 01/16/16 CAD has been stable, no angina since last visit, hypothyroidism taking Levothyroxine 1106mcg, last TSH 3.44 12/05/15. HTN is controlled, his memory has been gradual decline but functioning well in SNF, off [facility], peripheral neuropathy has been stable, he walks with walker for short distance and w/c to go further.  Past Medical  History:  Diagnosis Date  . Abnormality of gait 07/04/2012  . Actinic keratoses   . Arthritis   . Benign prostatic hyperplasia   . BPH (benign prostatic hyperplasia)   . CAD (coronary artery disease) of artery bypass graft 12/02/2013  . CAD (coronary artery disease) of bypass graft    a. s/p CABG 1991 without subsequent interventions.  . Chorioretinitis   . Chronic diastolic CHF (congestive heart failure) ([place])   . Colon cancer ([place])    a. admitted to [facility] 12/07/14 with profound iron deficiency anemia Hgb 4.5, melena, with new diagnosis of invasive adenocarcinoma of colon s/p lap R colectomy on 12/15/14.  . Dementia   . Diverticulitis   . Dizziness and giddiness 07/04/2012  . Edema   . Essential hypertension   . Hereditary and idiopathic peripheral neuropathy 07/04/2012   a history of chronic sensory polyneuropathy of undetermined origin with abnormality of gait. He is walking with a rolling walker. Last MRA of the head January 2013 shows no significant stenosis, MRA of the neck shows mild stenosis of the proximal left internal carotid artery.    Marland Kitchen History of atrial fibrillation   . HLD (hyperlipidemia)   . HTN (hypertension) 12/01/2013  . Hyperlipidemia   . Hypothyroidism 12/01/2013  . Hypothyroidism   . 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 Internal carotid artery stenosis    left  . Iron deficiency anemia    a. 11/2014 - Hgb 4.5 in setting of newly dx colon CA.   . Memory loss   . Mild left ventricular hypertrophy   . Neuromuscular disorder ([place])   . PAF (paroxysmal atrial fibrillation) ([place])    a.  Not on anticoag due to bleeding and fall risk.  . Panuveitis   . Peripheral autonomic neuropathy in disorders classified elsewhere(337.1) 07/04/2012  . Prostatitis, acute    1991  . Retinitis   . Stroke ([place])   . Thyroid disease   . Transient cerebral ischemia 07/04/2012   a posterior circulation TIA in October 2009.    . 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 APPENDECTOMY    . CARDIAC CATHETERIZATION    . COLON RESECTION N/A 12/15/2014   Procedure: Laparoscopic partial colectomy;  Surgeon: Autumn Messing III, MD;  Location: [facility] ORS;  Service: General;  Laterality: N/A;  . COLONOSCOPY WITH PROPOFOL N/A 12/12/2014   Procedure: COLONOSCOPY WITH PROPOFOL;  Surgeon: Gatha Mayer, MD;  Location: [facility] ENDOSCOPY;  Service: Endoscopy;  Laterality: N/A;  . CORONARY ARTERY BYPASS GRAFT  1991   grafts to LAD and RCA  . CORONARY ARTERY BYPASS GRAFT  1991    Allergies  Allergen Reactions  . Sulfa Antibiotics Other (See Comments)    unknown  . Ativan [Lorazepam] Anxiety      Medication List       Accurate as of 01/22/16  2:11 PM. Always use your most recent med list.          acetaminophen 325 MG tablet Commonly known as:  TYLENOL Take 650 mg by mouth. Take two tablets every day. Take 2 tablets every 6 hours as needed for mild pain.   albuterol (2.5 MG/3ML) 0.083% nebulizer solution Commonly known as:  PROVENTIL Take 2.5 mg by nebulization every 6 (six) hours as needed for wheezing or shortness of breath.   amiodarone 200 MG tablet Commonly known as:  PACERONE Take 1 tablet (200 mg total) by mouth daily.   atorvastatin 40 MG tablet Commonly known as:  LIPITOR Take 40 mg by mouth at bedtime.   carvedilol 3.125 MG tablet Commonly known as:  COREG Take 1 tablet (3.125 mg total) by mouth 2 (two) times daily with a meal.   CEREFOLIN 07-19-48-5 MG Tabs Take 1 tablet by mouth daily.   erythromycin ophthalmic ointment Place 1 application into the right eye. Place one application right eye at bedtime   FOLTANX 3-35-2 MG Tabs Take by mouth. Take one tablet daily   furosemide 20 MG tablet Commonly known as:  LASIX Take by mouth. Take 2 tablets = 40 mg daily.   Glucosamine-Chondroitin 750-600 MG Tabs Take 2 tablets by mouth daily.   levothyroxine 112 MCG tablet Commonly known as:   SYNTHROID, LEVOTHROID Take 112 mcg by mouth daily before breakfast.   multivitamin with minerals Tabs tablet Take 1 tablet by mouth daily. CERTA-VITE SENIOR   polyethylene glycol packet Commonly known as:  MIRALAX / GLYCOLAX Take 17 g by mouth daily.   potassium chloride SA 20 MEQ tablet Commonly known as:  K-DUR,KLOR-CON Take 1 tablet (20 mEq total) by mouth daily.   tamsulosin 0.4 MG Caps capsule Commonly known as:  FLOMAX Take by mouth. Take two capsules (0.8mg ) daily       Review of Systems  Constitutional: Negative for chills, diaphoresis and fever.  HENT: Positive for hearing loss. Negative for congestion and ear pain.   Eyes: Negative for pain, discharge and redness.  Respiratory: Negative for cough and shortness of breath.   Cardiovascular: Negative for chest pain, palpitations and leg swelling.  Gastrointestinal: Positive for constipation. Negative for abdominal pain, diarrhea, nausea and vomiting.  Genitourinary: Positive  for frequency. Negative for dysuria and urgency.  Musculoskeletal: Positive for arthralgias, back pain and gait problem. Negative for myalgias and neck pain.       Ambulates with walker, w/c to go further. C/o right knee pain with walker, declined pain meds. Frequent falls.   Skin: Negative for rash.       Healed abd laparoscopic surgical scars.  Bleeding keratotic lesions of the superior aspect of the right pinna. Painless.  Neurological: Negative for dizziness, tremors, seizures, weakness and headaches.       Peripheral neuropathy  Psychiatric/Behavioral: Positive for confusion and decreased concentration. Negative for hallucinations and suicidal ideas. The patient is not nervous/anxious.        Mood has been stabilized.     Immunization History  Administered Date(s) Administered  . Influenza-Unspecified 12/22/2013, 11/09/2014, 12/05/2015  . PPD Test 01/03/2015  . Pneumococcal Polysaccharide-23 12/02/1993  . Tdap 01/08/2013  . Zoster  12/03/2007   Pertinent  Health Maintenance Due  Topic Date Due  . PNA vac Low Risk Adult (2 of 2 - PCV13) 12/03/1994  . INFLUENZA VACCINE  Completed   Fall Risk  02/10/2015 06/27/2014  Falls in the past year? Yes No  Number falls in past yr: 1 -  Injury with Fall? No -  Risk for fall due to : History of fall(s) Impaired balance/gait  Follow up Falls evaluation completed -   Functional Status Survey:    Vitals:   01/22/16 1312  BP: 110/60  Pulse: 77  Resp: 18  Temp: 97.6 F (36.4 C)  Weight: 186 lb 11.2 oz (84.7 kg)  Height: 6' (1.829 m)   Body mass index is 25.32 kg/m. Physical Exam  Constitutional: He appears well-developed and well-nourished. No distress.  HENT:  Head: Normocephalic and atraumatic.  Right Ear: External ear normal.  Left Ear: External ear normal.  Nose: Nose normal.  Mouth/Throat: Oropharynx is clear and moist. No oropharyngeal exudate.  Eyes: Conjunctivae and EOM are normal. Pupils are equal, round, and reactive to light. Right eye exhibits no discharge. Left eye exhibits no discharge. No scleral icterus.  Neck: Normal range of motion. Neck supple. No JVD present. No tracheal deviation present. No thyromegaly present.  Cardiovascular: Normal rate and regular rhythm.  Exam reveals no gallop and no friction rub.   Murmur heard. Systolic murmur [id number] right sternal border   Pulmonary/Chest: Effort normal and breath sounds normal. No stridor. No respiratory distress. He has no wheezes. He has no rales. He exhibits no tenderness.  Abdominal: Soft. Bowel sounds are normal. He exhibits no distension and no mass. There is no tenderness.  Musculoskeletal: Normal range of motion. He exhibits tenderness. He exhibits no edema.  R knee pain with walking, no erythema, swelling, patellar ballottement, or crepitus.   Neurological: He is alert. He has normal reflexes. No cranial nerve deficit. He exhibits normal muscle tone. Coordination normal.  Skin: Skin is warm and  dry. No rash noted. He is not diaphoretic. No erythema. No pallor.  Painless bleeding lesion in the superior aspect of the right and left  Psychiatric: He has a normal mood and affect. Thought content normal. Cognition and memory are impaired. He exhibits abnormal recent memory. He exhibits normal remote memory.  Memory deficits.     Labs reviewed:  Recent Labs  05/19/15 1521 10/06/15 01/16/16  NA 141 143 144  K 4.3 4.2 3.9  CL 104  --   --   CO2 29  --   --   GLUCOSE  123*  --   --   BUN 25 23* 21  CREATININE 1.40* 1.3 1.4*  CALCIUM 9.1  --   --     Recent Labs  04/25/15 10/06/15 01/16/16  AST 18 19 21   ALT 13 12 13   ALKPHOS 70 83 77    Recent Labs  05/19/15 1521 06/20/15 10/06/15 01/16/16  WBC 4.1 4.4 5.0 5.2  NEUTROABS 2.5  --   --   --   HGB 12.3* 12.4* 12.3* 12.8*  HCT 37.6* 38* 37* 39*  MCV 94.0  --   --   --   PLT 189 188 182 197   Lab Results  Component Value Date   TSH 3.44 12/05/2015   Lab Results  Component Value Date   HGBA1C 5.9 (H) 12/15/2014   Lab Results  Component Value Date   CHOL 95 12/26/2015   HDL 42 12/26/2015   LDLCALC 46 12/26/2015   TRIG 37 (A) 12/26/2015   CHOLHDL 2.6 12/07/2014    Significant Diagnostic Results in last 30 days:  No results found.  Assessment/Plan Constipation C/o loose stools, 01/17/16 changed Miralax to prn, update BMP one week, may consider stool C diff toxin A/B check to r/o clostridium difficile colitis.   Essential hypertension Controlled, continue Coreg 3.125mg  bid, Atorvastatin 40mg  daily for risk reduction.     PAF (paroxysmal atrial fibrillation) (H[facility]) Heart rate is in control, continue Amiodarone 200mg  daily.     Chronic diastolic CHF (congestive heart failure) ([facility]) Clinically compensated, continue Furosemide 40mg  daily  Hypothyroidism 10/09/15 increase Levothyroxine to 128mcg daily 12/05/15 TSH 3.44    Alzheimer's disease Off [facility], SNF for care needs, baseline  confusion. Hallucination x3, continue to observe, MCU   Benign prostatic hyperplasia No urinary retention. cotninueTamsulosin 0.8 mg daily.     Urinary tract infection 11/25/15 urine culture Enterococcus 50,000-100,000c/ml, 7 day course Nitrofurantoin bid.  Last treated UTI 11/25/15     Family/ staff Communication: SNF MCU  Labs/tests ordered:  BMP

## 2016-01-30 DIAGNOSIS — I10 Essential (primary) hypertension: Secondary | ICD-10-CM | POA: Diagnosis not present

## 2016-01-30 LAB — BASIC METABOLIC PANEL
BUN: 24 mg/dL — AB (ref 4–21)
CREATININE: 1.4 mg/dL — AB (ref <=1.3)
GLUCOSE: 85 mg/dL
POTASSIUM: 4.1 mmol/L (ref 3.4–5.3)
Sodium: 148 mmol/L — AB (ref 137–147)

## 2016-01-31 ENCOUNTER — Other Ambulatory Visit: Payer: Self-pay | Admitting: *Deleted

## 2016-01-31 ENCOUNTER — Encounter: Payer: Self-pay | Admitting: Nurse Practitioner

## 2016-01-31 DIAGNOSIS — E870 Hyperosmolality and hypernatremia: Secondary | ICD-10-CM | POA: Insufficient documentation

## 2016-02-06 DIAGNOSIS — I10 Essential (primary) hypertension: Secondary | ICD-10-CM | POA: Diagnosis not present

## 2016-02-06 LAB — BASIC METABOLIC PANEL
BUN: 17 mg/dL (ref 4–21)
CREATININE: 1.4 mg/dL — AB (ref <=1.3)
Glucose: 86 mg/dL
Potassium: 3.8 mmol/L (ref 3.4–5.3)
SODIUM: 145 mmol/L (ref 137–147)

## 2016-02-08 ENCOUNTER — Other Ambulatory Visit: Payer: Self-pay | Admitting: *Deleted

## 2016-02-13 DIAGNOSIS — H30103 Unspecified disseminated chorioretinal inflammation, bilateral: Secondary | ICD-10-CM | POA: Diagnosis not present

## 2016-02-13 DIAGNOSIS — Z961 Presence of intraocular lens: Secondary | ICD-10-CM | POA: Diagnosis not present

## 2016-02-13 DIAGNOSIS — H2512 Age-related nuclear cataract, left eye: Secondary | ICD-10-CM | POA: Diagnosis not present

## 2016-02-22 ENCOUNTER — Encounter: Payer: Self-pay | Admitting: Nurse Practitioner

## 2016-02-22 ENCOUNTER — Non-Acute Institutional Stay (SKILLED_NURSING_FACILITY): Payer: Medicare Other | Admitting: Nurse Practitioner

## 2016-02-22 DIAGNOSIS — R609 Edema, unspecified: Secondary | ICD-10-CM

## 2016-02-22 DIAGNOSIS — N182 Chronic kidney disease, stage 2 (mild): Secondary | ICD-10-CM

## 2016-02-22 DIAGNOSIS — R05 Cough: Secondary | ICD-10-CM | POA: Diagnosis not present

## 2016-02-22 DIAGNOSIS — F0281 Dementia in other diseases classified elsewhere with behavioral disturbance: Secondary | ICD-10-CM

## 2016-02-22 DIAGNOSIS — N400 Enlarged prostate without lower urinary tract symptoms: Secondary | ICD-10-CM | POA: Diagnosis not present

## 2016-02-22 DIAGNOSIS — I480 Paroxysmal atrial fibrillation: Secondary | ICD-10-CM

## 2016-02-22 DIAGNOSIS — E039 Hypothyroidism, unspecified: Secondary | ICD-10-CM

## 2016-02-22 DIAGNOSIS — K5900 Constipation, unspecified: Secondary | ICD-10-CM | POA: Diagnosis not present

## 2016-02-22 DIAGNOSIS — J811 Chronic pulmonary edema: Secondary | ICD-10-CM | POA: Diagnosis not present

## 2016-02-22 DIAGNOSIS — I10 Essential (primary) hypertension: Secondary | ICD-10-CM | POA: Diagnosis not present

## 2016-02-22 DIAGNOSIS — I5032 Chronic diastolic (congestive) heart failure: Secondary | ICD-10-CM

## 2016-02-22 DIAGNOSIS — J209 Acute bronchitis, unspecified: Secondary | ICD-10-CM | POA: Diagnosis not present

## 2016-02-22 DIAGNOSIS — G622 Polyneuropathy due to other toxic agents: Secondary | ICD-10-CM | POA: Diagnosis not present

## 2016-02-22 DIAGNOSIS — G301 Alzheimer's disease with late onset: Secondary | ICD-10-CM

## 2016-02-22 DIAGNOSIS — G619 Inflammatory polyneuropathy, unspecified: Secondary | ICD-10-CM

## 2016-02-22 NOTE — Assessment & Plan Note (Signed)
Stable since 01/17/16 changed Miralax to prn  

## 2016-02-22 NOTE — Assessment & Plan Note (Signed)
Clinically compensated, continue Furosemide 40mg  daily, 02/05/17 Na 145, K 3.8, Bun 17, creat 1.37

## 2016-02-22 NOTE — Assessment & Plan Note (Signed)
Heart rate is in control, continue Amiodarone 200mg daily.  

## 2016-02-22 NOTE — Assessment & Plan Note (Signed)
02/06/16 Bun 17/creat 1.37

## 2016-02-22 NOTE — Assessment & Plan Note (Signed)
cough, yellow/brownish phlegm, nasal congestion, denied SOB or chest pain, vomited x1, no constipation or diarrhea. No O2 desaturation.   Obtain CBC CMP UA C/S CXR  Avelox 400mg  po daily x 10 days

## 2016-02-22 NOTE — Assessment & Plan Note (Signed)
10/09/15 increase Levothyroxine to 112mcg daily 12/05/15 TSH 3.44  

## 2016-02-22 NOTE — Progress Notes (Signed)
Location:  [facility] Room Number: 23 Place of Service:  SNF (31) Provider:  Lateasha Breuer, Manxie  NP  Jeanmarie Hubert, MD  Patient Care Team: Estill Dooms, MD as PCP - General (Internal Medicine) Zebedee Iba, MD as Referring Physician (Ophthalmology) Darlin Coco, MD as Consulting Physician (Cardiology) Garvin Fila, MD as Consulting Physician (Neurology) Festus Aloe, MD as Consulting Physician (Urology) Marilynne Halsted, MD as Referring Physician (Ophthalmology) Lavonna Monarch, MD as Consulting Physician (Dermatology) Gerarda Fraction, MD as Referring Physician (Ophthalmology) Nereyda Bowler Otho Darner, NP as Nurse Practitioner (Internal Medicine)  Extended Emergency Contact Information Primary Emergency Contact: Farr,Laura Address: [address] [place] [address] Johnnette Litter of [place] Phone: [phone] Mobile Phone: [phone] Relation: Daughter Secondary Emergency Contact: Genia Plants [place] of [place] Phone: [phone] Relation: Daughter    Code Status:  Full Code Goals of care: Advanced Directive information Advanced Directives 02/22/2016  Does Patient Have a Medical Advance Directive? No  Would patient like information on creating a medical advance directive? No - Patient declined     Chief Complaint  Patient presents with  . Acute Visit    cold symptoms    HPI:  Pt is a 83 y.o. male seen today for an acute visit for cough, yellow/brownish phlegm, nasal congestion, denied SOB or chest pain, vomited x1, no constipation or diarrhea. No O2 desaturation.    Hx of anemia, s/p partial colectomy, last Hgb 12.8 01/16/16 CAD has been stable, no angina since last visit, hypothyroidism taking Levothyroxine 164mcg, last TSH 3.44 12/05/15.HTN is controlled, his memory has been gradual decline but functioning well in SNF, off Namenda, peripheral neuropathy has been stable, he walks with walker for short distance and w/c  to go further. Past Medical History:  Diagnosis Date  . Abnormality of gait 07/04/2012  . Actinic keratoses   . Arthritis   . Benign prostatic hyperplasia   . BPH (benign prostatic hyperplasia)   . CAD (coronary artery disease) of artery bypass graft 12/02/2013  . CAD (coronary artery disease) of bypass graft    a. s/p CABG 1991 without subsequent interventions.  . Chorioretinitis   . Chronic diastolic CHF (congestive heart failure) ([place])   . Colon cancer ([place])    a. admitted to [facility] 12/07/14 with profound iron deficiency anemia Hgb 4.5, melena, with new diagnosis of invasive adenocarcinoma of colon s/p lap R colectomy on 12/15/14.  . Dementia   . Diverticulitis   . Dizziness and giddiness 07/04/2012  . Edema   . Essential hypertension   . Hereditary and idiopathic peripheral neuropathy 07/04/2012   a history of chronic sensory polyneuropathy of undetermined origin with abnormality of gait. He is walking with a rolling walker. Last MRA of the head January 2013 shows no significant stenosis, MRA of the neck shows mild stenosis of the proximal left internal carotid artery.    Marland Kitchen History of atrial fibrillation   . HLD (hyperlipidemia)   . HTN (hypertension) 12/01/2013  . Hyperlipidemia   . Hypothyroidism 12/01/2013  . Hypothyroidism   . 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 Internal carotid artery stenosis    left  . Iron deficiency anemia    a. 11/2014 - Hgb 4.5 in setting of newly dx colon CA.   . Memory loss   . Mild left ventricular hypertrophy   . Neuromuscular disorder ([place])   . PAF (paroxysmal atrial fibrillation) ([place])  a. Not on anticoag due to bleeding and fall risk.  . Panuveitis   . Peripheral autonomic neuropathy in disorders classified elsewhere(337.1) 07/04/2012  . Prostatitis, acute    1991  . Retinitis   . Stroke ([place])   . Thyroid disease   . Transient cerebral ischemia 07/04/2012   a posterior circulation TIA in October 2009.    .  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 APPENDECTOMY    . CARDIAC CATHETERIZATION    . COLON RESECTION N/A 12/15/2014   Procedure: Laparoscopic partial colectomy;  Surgeon: Autumn Messing III, MD;  Location: [facility] ORS;  Service: General;  Laterality: N/A;  . COLONOSCOPY WITH PROPOFOL N/A 12/12/2014   Procedure: COLONOSCOPY WITH PROPOFOL;  Surgeon: Gatha Mayer, MD;  Location: [facility] ENDOSCOPY;  Service: Endoscopy;  Laterality: N/A;  . CORONARY ARTERY BYPASS GRAFT  1991   grafts to LAD and RCA  . CORONARY ARTERY BYPASS GRAFT  1991    Allergies  Allergen Reactions  . Sulfa Antibiotics Other (See Comments)    unknown  . Ativan [Lorazepam] Anxiety    Allergies as of 02/22/2016      Reactions   Sulfa Antibiotics Other (See Comments)   unknown   Ativan [lorazepam] Anxiety      Medication List       Accurate as of 02/22/16  3:36 PM. Always use your most recent med list.          acetaminophen 325 MG tablet Commonly known as:  TYLENOL Take 650 mg by mouth. Take two tablets every day. Take 2 tablets every 6 hours as needed for mild pain.   albuterol (2.5 MG/3ML) 0.083% nebulizer solution Commonly known as:  PROVENTIL Take 2.5 mg by nebulization every 6 (six) hours as needed for wheezing or shortness of breath.   amiodarone 200 MG tablet Commonly known as:  PACERONE Take 1 tablet (200 mg total) by mouth daily.   atorvastatin 40 MG tablet Commonly known as:  LIPITOR Take 40 mg by mouth at bedtime.   carvedilol 3.125 MG tablet Commonly known as:  COREG Take 1 tablet (3.125 mg total) by mouth 2 (two) times daily with a meal.   CEREFOLIN 07-19-48-5 MG Tabs Take 1 tablet by mouth daily.   erythromycin ophthalmic ointment Place 1 application into the right eye. Place one application right eye at bedtime   FOLTANX 3-35-2 MG Tabs Take by mouth. Take one tablet daily   furosemide 20 MG tablet Commonly known as:  LASIX Take by  mouth. Take 2 tablets = 40 mg daily.   Glucosamine-Chondroitin 750-600 MG Tabs Take 2 tablets by mouth daily.   levothyroxine 112 MCG tablet Commonly known as:  SYNTHROID, LEVOTHROID Take 112 mcg by mouth daily before breakfast.   moxifloxacin 400 MG tablet Commonly known as:  AVELOX Take 400 mg by mouth daily at 8 pm.   multivitamin with minerals Tabs tablet Take 1 tablet by mouth daily. CERTA-VITE SENIOR   polyethylene glycol packet Commonly known as:  MIRALAX / GLYCOLAX Take 17 g by mouth daily.   potassium chloride SA 20 MEQ tablet Commonly known as:  K-DUR,KLOR-CON Take 1 tablet (20 mEq total) by mouth daily.   REFRESH LACRI-LUBE Oint Apply to eye at bedtime.   RESTASIS 0.05 % ophthalmic emulsion Generic drug:  cycloSPORINE Place 1 drop into both eyes 2 (two) times daily.   tamsulosin 0.4 MG Caps capsule Commonly known as:  FLOMAX Take by mouth.  Take two capsules (0.8mg ) daily       Review of Systems  Constitutional: Positive for fatigue. Negative for chills, diaphoresis and fever.  HENT: Positive for hearing loss. Negative for congestion and ear pain.   Eyes: Negative for pain, discharge and redness.  Respiratory: Positive for cough. Negative for shortness of breath.   Cardiovascular: Negative for chest pain, palpitations and leg swelling.  Gastrointestinal: Positive for constipation. Negative for abdominal pain, diarrhea, nausea and vomiting.  Genitourinary: Positive for frequency. Negative for dysuria and urgency.  Musculoskeletal: Positive for arthralgias, back pain and gait problem. Negative for myalgias and neck pain.       Ambulates with walker, w/c to go further. C/o right knee pain with walker, declined pain meds. Frequent falls.   Skin: Negative for rash.       Healed abd laparoscopic surgical scars.  Bleeding keratotic lesions of the superior aspect of the right pinna. Painless.  Neurological: Negative for dizziness, tremors, seizures, weakness and  headaches.       Peripheral neuropathy  Psychiatric/Behavioral: Positive for confusion and decreased concentration. Negative for hallucinations and suicidal ideas. The patient is not nervous/anxious.        Mood has been stabilized.     Immunization History  Administered Date(s) Administered  . Influenza-Unspecified 12/22/2013, 11/09/2014, 12/05/2015  . PPD Test 01/03/2015  . Pneumococcal Polysaccharide-23 12/02/1993  . Tdap 01/08/2013  . Zoster 12/03/2007   Pertinent  Health Maintenance Due  Topic Date Due  . PNA vac Low Risk Adult (2 of 2 - PCV13) 12/03/1994  . INFLUENZA VACCINE  Completed   Fall Risk  02/10/2015 06/27/2014  Falls in the past year? Yes No  Number falls in past yr: 1 -  Injury with Fall? No -  Risk for fall due to : History of fall(s) Impaired balance/gait  Follow up Falls evaluation completed -   Functional Status Survey:    Vitals:   02/22/16 1446  BP: 130/88  Pulse: 76  Resp: 18  Temp: 97.4 F (36.3 C)  Weight: 190 lb (86.2 kg)  Height: 6' (1.829 m)   Body mass index is 25.77 kg/m. Physical Exam  Constitutional: He appears well-developed and well-nourished. No distress.  HENT:  Head: Normocephalic and atraumatic.  Right Ear: External ear normal.  Left Ear: External ear normal.  Nose: Nose normal.  Mouth/Throat: Oropharynx is clear and moist. No oropharyngeal exudate.  Eyes: Conjunctivae and EOM are normal. Pupils are equal, round, and reactive to light. Right eye exhibits no discharge. Left eye exhibits no discharge. No scleral icterus.  Neck: Normal range of motion. Neck supple. No JVD present. No tracheal deviation present. No thyromegaly present.  Cardiovascular: Normal rate and regular rhythm.  Exam reveals no gallop and no friction rub.   Murmur heard. Systolic murmur [id number] right sternal border   Pulmonary/Chest: Effort normal. No stridor. No respiratory distress. He has no wheezes. He has rales. He exhibits no tenderness.  Abdominal:  Soft. Bowel sounds are normal. He exhibits no distension and no mass. There is no tenderness.  Musculoskeletal: Normal range of motion. He exhibits tenderness. He exhibits no edema.  R knee pain with walking, no erythema, swelling, patellar ballottement, or crepitus.   Neurological: He is alert. He has normal reflexes. No cranial nerve deficit. He exhibits normal muscle tone. Coordination normal.  Skin: Skin is warm and dry. No rash noted. He is not diaphoretic. No erythema. No pallor.  Painless bleeding lesion in the superior aspect of the right  and left  Psychiatric: He has a normal mood and affect. Thought content normal. Cognition and memory are impaired. He exhibits abnormal recent memory. He exhibits normal remote memory.  Memory deficits.     Labs reviewed:  Recent Labs  05/19/15 1521  01/16/16 01/30/16 02/06/16  NA 141  < > 144 148* 145  K 4.3  < > 3.9 4.1 3.8  CL 104  --   --   --   --   CO2 29  --   --   --   --   GLUCOSE 123*  --   --   --   --   BUN 25  < > 21 24* 17  CREATININE 1.40*  < > 1.4* 1.4* 1.4*  CALCIUM 9.1  --   --   --   --   < > = values in this interval not displayed.  Recent Labs  04/25/15 10/06/15 01/16/16  AST 18 19 21   ALT 13 12 13   ALKPHOS 70 83 77    Recent Labs  05/19/15 1521 06/20/15 10/06/15 01/16/16  WBC 4.1 4.4 5.0 5.2  NEUTROABS 2.5  --   --   --   HGB 12.3* 12.4* 12.3* 12.8*  HCT 37.6* 38* 37* 39*  MCV 94.0  --   --   --   PLT 189 188 182 197   Lab Results  Component Value Date   TSH 3.44 12/05/2015   Lab Results  Component Value Date   HGBA1C 5.9 (H) 12/15/2014   Lab Results  Component Value Date   CHOL 95 12/26/2015   HDL 42 12/26/2015   LDLCALC 46 12/26/2015   TRIG 37 (A) 12/26/2015   CHOLHDL 2.6 12/07/2014    Significant Diagnostic Results in last 30 days:  No results found.  Assessment/Plan Acute bronchitis cough, yellow/brownish phlegm, nasal congestion, denied SOB or chest pain, vomited x1, no constipation  or diarrhea. No O2 desaturation.   Obtain CBC CMP UA C/S CXR  Avelox 400mg  po daily x 10 days  Essential hypertension Controlled, continue Coreg 3.125mg  bid, Atorvastatin 40mg  daily for risk reduction. 02/05/17 Na 145, K 3.8, Bun 17, creat 1.37      PAF (paroxysmal atrial fibrillation) ([facility]) Heart rate is in control, continue Amiodarone 200mg  daily.     Chronic diastolic CHF (congestive heart failure) ([facility]) Clinically compensated, continue Furosemide 40mg  daily, 02/05/17 Na 145, K 3.8, Bun 17, creat 1.37   Constipation Stable since 01/17/16 changed Miralax to prn  Hypothyroidism 10/09/15 increase Levothyroxine to 127mcg daily 12/05/15 TSH 3.44    Alzheimer's disease Off Namenda, baseline confusion, resides in [facility]   Inflammatory and toxic neuropathy Lower body weakness, ambulates with walker for short distance and w/c to go further.   Chronic kidney disease 02/06/16 Bun 17/creat 1.37  Benign prostatic hyperplasia No urinary retention. cotninueTamsulosin 0.8 mg daily.     Edema race in ankles, continue Furosemide.      Family/ staff Communication: SNF  Labs/tests ordered:  CXR CBC CMP UA C/S

## 2016-02-22 NOTE — Assessment & Plan Note (Signed)
Controlled, continue Coreg 3.125mg  bid, Atorvastatin 40mg  daily for risk reduction. 02/05/17 Na 145, K 3.8, Bun 17, creat 1.37

## 2016-02-22 NOTE — Assessment & Plan Note (Signed)
Off Namenda, baseline confusion, resides in [facility]

## 2016-02-22 NOTE — Assessment & Plan Note (Signed)
Lower body weakness, ambulates with walker for short distance and w/c to go further.

## 2016-02-22 NOTE — Assessment & Plan Note (Signed)
No urinary retention. cotninueTamsulosin 0.8 mg daily.  

## 2016-02-22 NOTE — Assessment & Plan Note (Signed)
race in ankles, continue Furosemide.

## 2016-02-23 DIAGNOSIS — N390 Urinary tract infection, site not specified: Secondary | ICD-10-CM | POA: Diagnosis not present

## 2016-02-23 DIAGNOSIS — I4891 Unspecified atrial fibrillation: Secondary | ICD-10-CM | POA: Diagnosis not present

## 2016-02-23 LAB — CBC AND DIFFERENTIAL
HCT: 36 % — AB (ref 41–53)
Hemoglobin: 11.6 g/dL — AB (ref 13.5–17.5)
PLATELETS: 194 10*3/uL (ref 150–399)
WBC: 8 10^3/mL

## 2016-02-23 LAB — BASIC METABOLIC PANEL
BUN: 23 mg/dL — AB (ref 4–21)
CREATININE: 1.2 mg/dL (ref <=1.3)
Glucose: 97 mg/dL
Potassium: 3.8 mmol/L (ref 3.4–5.3)
Sodium: 147 mmol/L (ref 137–147)

## 2016-03-08 ENCOUNTER — Other Ambulatory Visit: Payer: Self-pay | Admitting: *Deleted

## 2016-03-11 ENCOUNTER — Non-Acute Institutional Stay (SKILLED_NURSING_FACILITY): Payer: Medicare Other | Admitting: Nurse Practitioner

## 2016-03-11 ENCOUNTER — Encounter: Payer: Self-pay | Admitting: Nurse Practitioner

## 2016-03-11 DIAGNOSIS — N182 Chronic kidney disease, stage 2 (mild): Secondary | ICD-10-CM | POA: Diagnosis not present

## 2016-03-11 DIAGNOSIS — D500 Iron deficiency anemia secondary to blood loss (chronic): Secondary | ICD-10-CM

## 2016-03-11 DIAGNOSIS — I5032 Chronic diastolic (congestive) heart failure: Secondary | ICD-10-CM

## 2016-03-11 DIAGNOSIS — I480 Paroxysmal atrial fibrillation: Secondary | ICD-10-CM | POA: Diagnosis not present

## 2016-03-11 DIAGNOSIS — N400 Enlarged prostate without lower urinary tract symptoms: Secondary | ICD-10-CM

## 2016-03-11 DIAGNOSIS — E870 Hyperosmolality and hypernatremia: Secondary | ICD-10-CM | POA: Diagnosis not present

## 2016-03-11 DIAGNOSIS — J209 Acute bronchitis, unspecified: Secondary | ICD-10-CM | POA: Diagnosis not present

## 2016-03-11 DIAGNOSIS — I10 Essential (primary) hypertension: Secondary | ICD-10-CM

## 2016-03-11 DIAGNOSIS — E039 Hypothyroidism, unspecified: Secondary | ICD-10-CM | POA: Diagnosis not present

## 2016-03-11 DIAGNOSIS — R609 Edema, unspecified: Secondary | ICD-10-CM | POA: Diagnosis not present

## 2016-03-11 DIAGNOSIS — K5900 Constipation, unspecified: Secondary | ICD-10-CM

## 2016-03-11 DIAGNOSIS — F02818 Dementia in other diseases classified elsewhere, unspecified severity, with other behavioral disturbance: Secondary | ICD-10-CM

## 2016-03-11 DIAGNOSIS — G301 Alzheimer's disease with late onset: Secondary | ICD-10-CM | POA: Diagnosis not present

## 2016-03-11 DIAGNOSIS — F0281 Dementia in other diseases classified elsewhere with behavioral disturbance: Secondary | ICD-10-CM

## 2016-03-11 NOTE — Assessment & Plan Note (Signed)
Clinically compensated, continue Furosemide 10mg  daily, update CMP BNP

## 2016-03-11 NOTE — Assessment & Plan Note (Signed)
No urinary retention. cotninueTamsulosin 0.8 mg daily.  

## 2016-03-11 NOTE — Assessment & Plan Note (Signed)
02/23/16 Bun 23, creat 1.15

## 2016-03-11 NOTE — Assessment & Plan Note (Signed)
Off Namenda, baseline confusion, resides in [facility]

## 2016-03-11 NOTE — Assessment & Plan Note (Signed)
Controlled, continue Coreg 3.125mg  bid, Atorvastatin 40mg  daily for risk reduction.

## 2016-03-11 NOTE — Assessment & Plan Note (Signed)
Last Hgb 11.6 02/23/16, repeat CBC

## 2016-03-11 NOTE — Progress Notes (Signed)
Location:  [facility] Room Number: 105 Place of Service:  SNF (31) Provider:  Calen Posch, Manxie  NP  Jeanmarie Hubert, MD  Patient Care Team: Estill Dooms, MD as PCP - General (Internal Medicine) Zebedee Iba, MD as Referring Physician (Ophthalmology) Darlin Coco, MD as Consulting Physician (Cardiology) Garvin Fila, MD as Consulting Physician (Neurology) Festus Aloe, MD as Consulting Physician (Urology) Marilynne Halsted, MD as Referring Physician (Ophthalmology) Lavonna Monarch, MD as Consulting Physician (Dermatology) Gerarda Fraction, MD as Referring Physician (Ophthalmology) Dannilynn Gallina Otho Darner, NP as Nurse Practitioner (Internal Medicine)  Extended Emergency Contact Information Primary Emergency Contact: Farr,Laura Address: [address] [place] [address] Johnnette Litter of [place] Phone: [phone] Mobile Phone: [phone] Relation: Daughter Secondary Emergency Contact: Genia Plants [place] of [place] Phone: [phone] Relation: Daughter  Code Status:  Full Code Goals of care: Advanced Directive information Advanced Directives 03/11/2016  Does Patient Have a Medical Advance Directive? No  Would patient like information on creating a medical advance directive? No - Patient declined     Chief Complaint  Patient presents with  . Acute Visit    Increased edema in right leg    HPI:  Pt is a 83 y.o. male seen today for an acute visit for increased edema BLE, R>L, no open wounds, denied SOB, chest pain, palpitation, cough, or sputum production. He is afebrile, no noted O2 desaturation.   Hx of anemia, s/p partial colectomy, last Hgb 11.6 1/15/18CAD has been stable, no angina since last visit, hypothyroidism taking Levothyroxine 168mcg, last TSH 3.44 12/05/15.HTN is controlled, his memory has been gradual decline but functioning well in SNF, off Namenda, peripheral neuropathy has been stable, he walks with walker  for short distance and w/c to go further.   Past Medical History:  Diagnosis Date  . Abnormality of gait 07/04/2012  . Actinic keratoses   . Arthritis   . Benign prostatic hyperplasia   . BPH (benign prostatic hyperplasia)   . CAD (coronary artery disease) of artery bypass graft 12/02/2013  . CAD (coronary artery disease) of bypass graft    a. s/p CABG 1991 without subsequent interventions.  . Chorioretinitis   . Chronic diastolic CHF (congestive heart failure) ([place])   . Colon cancer ([place])    a. admitted to [facility] 12/07/14 with profound iron deficiency anemia Hgb 4.5, melena, with new diagnosis of invasive adenocarcinoma of colon s/p lap R colectomy on 12/15/14.  . Dementia   . Diverticulitis   . Dizziness and giddiness 07/04/2012  . Edema   . Essential hypertension   . Hereditary and idiopathic peripheral neuropathy 07/04/2012   a history of chronic sensory polyneuropathy of undetermined origin with abnormality of gait. He is walking with a rolling walker. Last MRA of the head January 2013 shows no significant stenosis, MRA of the neck shows mild stenosis of the proximal left internal carotid artery.    Marland Kitchen History of atrial fibrillation   . HLD (hyperlipidemia)   . HTN (hypertension) 12/01/2013  . Hyperlipidemia   . Hypothyroidism 12/01/2013  . Hypothyroidism   . 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 Internal carotid artery stenosis    left  . Iron deficiency anemia    a. 11/2014 - Hgb 4.5 in setting of newly dx colon CA.   . Memory loss   . Mild left ventricular hypertrophy   . Neuromuscular disorder ([place])   . PAF (paroxysmal atrial  fibrillation) ([place])    a. Not on anticoag due to bleeding and fall risk.  . Panuveitis   . Peripheral autonomic neuropathy in disorders classified elsewhere(337.1) 07/04/2012  . Prostatitis, acute    1991  . Retinitis   . Stroke ([place])   . Thyroid disease   . Transient cerebral ischemia 07/04/2012   a posterior circulation TIA in  October 2009.    . 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 APPENDECTOMY    . CARDIAC CATHETERIZATION    . COLON RESECTION N/A 12/15/2014   Procedure: Laparoscopic partial colectomy;  Surgeon: Autumn Messing III, MD;  Location: [facility] ORS;  Service: General;  Laterality: N/A;  . COLONOSCOPY WITH PROPOFOL N/A 12/12/2014   Procedure: COLONOSCOPY WITH PROPOFOL;  Surgeon: Gatha Mayer, MD;  Location: [facility] ENDOSCOPY;  Service: Endoscopy;  Laterality: N/A;  . CORONARY ARTERY BYPASS GRAFT  1991   grafts to LAD and RCA  . CORONARY ARTERY BYPASS GRAFT  1991    Allergies  Allergen Reactions  . Sulfa Antibiotics Other (See Comments)    unknown  . Ativan [Lorazepam] Anxiety    Allergies as of 03/11/2016      Reactions   Sulfa Antibiotics Other (See Comments)   unknown   Ativan [lorazepam] Anxiety      Medication List       Accurate as of 03/11/16  1:30 PM. Always use your most recent med list.          acetaminophen 325 MG tablet Commonly known as:  TYLENOL Take 650 mg by mouth. Take two tablets every day. Take 2 tablets every 6 hours as needed for mild pain.   albuterol (2.5 MG/3ML) 0.083% nebulizer solution Commonly known as:  PROVENTIL Take 2.5 mg by nebulization every 6 (six) hours as needed for wheezing or shortness of breath.   amiodarone 200 MG tablet Commonly known as:  PACERONE Take 1 tablet (200 mg total) by mouth daily.   atorvastatin 40 MG tablet Commonly known as:  LIPITOR Take 40 mg by mouth at bedtime.   carvedilol 3.125 MG tablet Commonly known as:  COREG Take 1 tablet (3.125 mg total) by mouth 2 (two) times daily with a meal.   CEREFOLIN 07-19-48-5 MG Tabs Take 1 tablet by mouth daily.   erythromycin ophthalmic ointment Place 1 application into the right eye. Place one application right eye at bedtime   FOLTANX 3-35-2 MG Tabs Take by mouth. Take one tablet daily   furosemide 20 MG tablet Commonly known  as:  LASIX Take by mouth. Take 2 tablets = 40 mg daily.   Glucosamine-Chondroitin 750-600 MG Tabs Take 2 tablets by mouth daily.   levothyroxine 112 MCG tablet Commonly known as:  SYNTHROID, LEVOTHROID Take 112 mcg by mouth daily before breakfast.   moxifloxacin 400 MG tablet Commonly known as:  AVELOX Take 400 mg by mouth daily at 8 pm.   multivitamin with minerals Tabs tablet Take 1 tablet by mouth daily. CERTA-VITE SENIOR   polyethylene glycol packet Commonly known as:  MIRALAX / GLYCOLAX Take 17 g by mouth daily.   potassium chloride SA 20 MEQ tablet Commonly known as:  K-DUR,KLOR-CON Take 1 tablet (20 mEq total) by mouth daily.   REFRESH LACRI-LUBE Oint Apply to eye at bedtime.   RESTASIS 0.05 % ophthalmic emulsion Generic drug:  cycloSPORINE Place 1 drop into both eyes 2 (two) times daily.   tamsulosin 0.4 MG Caps capsule Commonly known as:  FLOMAX Take by mouth. Take two capsules (0.8mg ) daily       Review of Systems  Constitutional: Positive for fatigue. Negative for chills, diaphoresis and fever.  HENT: Positive for hearing loss. Negative for congestion and ear pain.   Eyes: Negative for pain, discharge and redness.  Respiratory: Negative for cough and shortness of breath.   Cardiovascular: Positive for leg swelling. Negative for chest pain and palpitations.  Gastrointestinal: Positive for constipation. Negative for abdominal pain, diarrhea, nausea and vomiting.  Genitourinary: Positive for frequency. Negative for dysuria and urgency.  Musculoskeletal: Positive for arthralgias, back pain and gait problem. Negative for myalgias and neck pain.       Ambulates with walker, w/c to go further. C/o right knee pain with walker, declined pain meds. Frequent falls.   Skin: Negative for rash.       Healed abd laparoscopic surgical scars.  Bleeding keratotic lesions of the superior aspect of the right pinna. Painless.  Neurological: Negative for dizziness, tremors,  seizures, weakness and headaches.       Peripheral neuropathy  Psychiatric/Behavioral: Positive for confusion. Negative for decreased concentration, hallucinations and suicidal ideas. The patient is not nervous/anxious.        Mood has been stabilized.     Immunization History  Administered Date(s) Administered  . Influenza-Unspecified 12/22/2013, 11/09/2014, 12/05/2015  . PPD Test 01/03/2015  . Pneumococcal Polysaccharide-23 12/02/1993  . Tdap 01/08/2013  . Zoster 12/03/2007   Pertinent  Health Maintenance Due  Topic Date Due  . PNA vac Low Risk Adult (2 of 2 - PCV13) 12/03/1994  . INFLUENZA VACCINE  Completed   Fall Risk  02/10/2015 06/27/2014  Falls in the past year? Yes No  Number falls in past yr: 1 -  Injury with Fall? No -  Risk for fall due to : History of fall(s) Impaired balance/gait  Follow up Falls evaluation completed -   Functional Status Survey:    Vitals:   03/11/16 1140  BP: 128/70  Pulse: 72  Resp: 18  Temp: 97.9 F (36.6 C)  Weight: 190 lb (86.2 kg)  Height: 6' (1.829 m)   Body mass index is 25.77 kg/m. Physical Exam  Constitutional: He appears well-developed and well-nourished. No distress.  HENT:  Head: Normocephalic and atraumatic.  Right Ear: External ear normal.  Left Ear: External ear normal.  Nose: Nose normal.  Mouth/Throat: Oropharynx is clear and moist. No oropharyngeal exudate.  Eyes: Conjunctivae and EOM are normal. Pupils are equal, round, and reactive to light. Right eye exhibits no discharge. Left eye exhibits no discharge. No scleral icterus.  Neck: Normal range of motion. Neck supple. No JVD present. No tracheal deviation present. No thyromegaly present.  Cardiovascular: Normal rate and regular rhythm.  Exam reveals no gallop and no friction rub.   Murmur heard. Systolic murmur [id number] right sternal border   Pulmonary/Chest: Effort normal. No stridor. No respiratory distress. He has no wheezes. He has rales. He exhibits no  tenderness.  Abdominal: Soft. Bowel sounds are normal. He exhibits no distension and no mass. There is no tenderness.  Musculoskeletal: Normal range of motion. He exhibits edema and tenderness.  R knee pain with walking, no erythema, swelling, patellar ballottement, or crepitus. Edema BLE R 1+, L trace.   Neurological: He is alert. He has normal reflexes. No cranial nerve deficit. He exhibits normal muscle tone. Coordination normal.  Skin: Skin is warm and dry. No rash noted. He is not diaphoretic. No erythema. No pallor.  Painless bleeding  lesion in the superior aspect of the right and left  Psychiatric: He has a normal mood and affect. Thought content normal. Cognition and memory are impaired. He exhibits abnormal recent memory. He exhibits normal remote memory.  Memory deficits.     Labs reviewed:  Recent Labs  05/19/15 1521  01/30/16 02/06/16 02/23/16  NA 141  < > 148* 145 147  K 4.3  < > 4.1 3.8 3.8  CL 104  --   --   --   --   CO2 29  --   --   --   --   GLUCOSE 123*  --   --   --   --   BUN 25  < > 24* 17 23*  CREATININE 1.40*  < > 1.4* 1.4* 1.2  CALCIUM 9.1  --   --   --   --   < > = values in this interval not displayed.  Recent Labs  04/25/15 10/06/15 01/16/16  AST 18 19 21   ALT 13 12 13   ALKPHOS 70 83 77    Recent Labs  05/19/15 1521  10/06/15 01/16/16 02/23/16  WBC 4.1  < > 5.0 5.2 8.0  NEUTROABS 2.5  --   --   --   --   HGB 12.3*  < > 12.3* 12.8* 11.6*  HCT 37.6*  < > 37* 39* 36*  MCV 94.0  --   --   --   --   PLT 189  < > 182 197 194  < > = values in this interval not displayed. Lab Results  Component Value Date   TSH 3.44 12/05/2015   Lab Results  Component Value Date   HGBA1C 5.9 (H) 12/15/2014   Lab Results  Component Value Date   CHOL 95 12/26/2015   HDL 42 12/26/2015   LDLCALC 46 12/26/2015   TRIG 37 (A) 12/26/2015   CHOLHDL 2.6 12/07/2014    Significant Diagnostic Results in last 30 days:  No results  found.  Assessment/Plan Edema Increased edema BLE R>L since off Furosemide, will resume Furosemide 10mg , Kcl 95meq, update CBC CMP BNP  Essential hypertension Controlled, continue Coreg 3.125mg  bid, Atorvastatin 40mg  daily for risk reduction.     PAF (paroxysmal atrial fibrillation) ([facility]) Heart rate is in control, continue Amiodarone 200mg  daily.    Chronic diastolic CHF (congestive heart failure) ([facility]) Clinically compensated, continue Furosemide 10mg  daily, update CMP BNP  Acute bronchitis Resolved.   Constipation Stable since 01/17/16 changed Miralax to prn  Hypothyroidism 10/09/15 increase Levothyroxine to 165mcg daily 12/05/15 TSH 3.44    Alzheimer's disease Off Namenda, baseline confusion, resides in [facility]    Chronic kidney disease 02/23/16 Bun 23, creat 1.15  Benign prostatic hyperplasia No urinary retention. cotninueTamsulosin 0.8 mg daily.   Anemia, iron deficiency Last Hgb 11.6 02/23/16, repeat CBC  Hypernatremia 02/23/16 Na 147, update CMP     Family/ staff Communication: [facility]  Labs/tests ordered: CBC CMP BNP

## 2016-03-11 NOTE — Assessment & Plan Note (Signed)
10/09/15 increase Levothyroxine to 112mcg daily 12/05/15 TSH 3.44  

## 2016-03-11 NOTE — Assessment & Plan Note (Signed)
Heart rate is in control, continue Amiodarone 200mg daily.  

## 2016-03-11 NOTE — Assessment & Plan Note (Signed)
02/23/16 Na 147, update CMP

## 2016-03-11 NOTE — Assessment & Plan Note (Signed)
Increased edema BLE R>L since off Furosemide, will resume Furosemide 10mg , Kcl 58meq, update CBC CMP BNP

## 2016-03-11 NOTE — Assessment & Plan Note (Signed)
Stable since 01/17/16 changed Miralax to prn  

## 2016-03-11 NOTE — Assessment & Plan Note (Signed)
Resolved

## 2016-03-12 DIAGNOSIS — N182 Chronic kidney disease, stage 2 (mild): Secondary | ICD-10-CM | POA: Diagnosis not present

## 2016-03-12 LAB — BASIC METABOLIC PANEL
BUN: 20 mg/dL (ref 4–21)
CREATININE: 1.2 mg/dL (ref <=1.3)
Glucose: 76 mg/dL
Potassium: 3.7 mmol/L (ref 3.4–5.3)
SODIUM: 142 mmol/L (ref 137–147)

## 2016-03-13 ENCOUNTER — Other Ambulatory Visit: Payer: Self-pay | Admitting: *Deleted

## 2016-03-19 DIAGNOSIS — R609 Edema, unspecified: Secondary | ICD-10-CM | POA: Diagnosis not present

## 2016-03-19 DIAGNOSIS — D649 Anemia, unspecified: Secondary | ICD-10-CM | POA: Diagnosis not present

## 2016-03-19 DIAGNOSIS — I10 Essential (primary) hypertension: Secondary | ICD-10-CM | POA: Diagnosis not present

## 2016-03-19 LAB — HEPATIC FUNCTION PANEL
ALK PHOS: 83 U/L (ref 25–125)
ALT: 16 U/L (ref 10–40)
AST: 21 U/L (ref 14–40)
BILIRUBIN, TOTAL: 2 mg/dL

## 2016-03-19 LAB — CBC AND DIFFERENTIAL
HEMATOCRIT: 39 % — AB (ref 41–53)
Hemoglobin: 12.8 g/dL — AB (ref 13.5–17.5)
PLATELETS: 213 10*3/uL (ref 150–399)
WBC: 4.4 10^3/mL

## 2016-03-19 LAB — BASIC METABOLIC PANEL
BUN: 19 mg/dL (ref 4–21)
Creatinine: 1.2 mg/dL (ref <=1.3)
GLUCOSE: 85 mg/dL
Potassium: 3.9 mmol/L (ref 3.4–5.3)
Sodium: 141 mmol/L (ref 137–147)

## 2016-03-21 ENCOUNTER — Other Ambulatory Visit: Payer: Self-pay | Admitting: *Deleted

## 2016-03-22 DIAGNOSIS — N390 Urinary tract infection, site not specified: Secondary | ICD-10-CM | POA: Diagnosis not present

## 2016-04-01 ENCOUNTER — Encounter: Payer: Self-pay | Admitting: *Deleted

## 2016-04-01 ENCOUNTER — Non-Acute Institutional Stay (SKILLED_NURSING_FACILITY): Payer: Medicare Other | Admitting: Nurse Practitioner

## 2016-04-01 DIAGNOSIS — G609 Hereditary and idiopathic neuropathy, unspecified: Secondary | ICD-10-CM

## 2016-04-01 DIAGNOSIS — R609 Edema, unspecified: Secondary | ICD-10-CM | POA: Diagnosis not present

## 2016-04-01 DIAGNOSIS — I480 Paroxysmal atrial fibrillation: Secondary | ICD-10-CM | POA: Diagnosis not present

## 2016-04-01 DIAGNOSIS — N400 Enlarged prostate without lower urinary tract symptoms: Secondary | ICD-10-CM

## 2016-04-01 DIAGNOSIS — E870 Hyperosmolality and hypernatremia: Secondary | ICD-10-CM | POA: Diagnosis not present

## 2016-04-01 DIAGNOSIS — G301 Alzheimer's disease with late onset: Secondary | ICD-10-CM | POA: Diagnosis not present

## 2016-04-01 DIAGNOSIS — F0281 Dementia in other diseases classified elsewhere with behavioral disturbance: Secondary | ICD-10-CM

## 2016-04-01 DIAGNOSIS — K5900 Constipation, unspecified: Secondary | ICD-10-CM

## 2016-04-01 DIAGNOSIS — N182 Chronic kidney disease, stage 2 (mild): Secondary | ICD-10-CM

## 2016-04-01 DIAGNOSIS — R627 Adult failure to thrive: Secondary | ICD-10-CM

## 2016-04-01 DIAGNOSIS — I5032 Chronic diastolic (congestive) heart failure: Secondary | ICD-10-CM | POA: Diagnosis not present

## 2016-04-01 DIAGNOSIS — F5101 Primary insomnia: Secondary | ICD-10-CM | POA: Diagnosis not present

## 2016-04-01 DIAGNOSIS — G4700 Insomnia, unspecified: Secondary | ICD-10-CM | POA: Insufficient documentation

## 2016-04-01 DIAGNOSIS — E039 Hypothyroidism, unspecified: Secondary | ICD-10-CM | POA: Diagnosis not present

## 2016-04-01 DIAGNOSIS — F02818 Dementia in other diseases classified elsewhere, unspecified severity, with other behavioral disturbance: Secondary | ICD-10-CM

## 2016-04-01 DIAGNOSIS — I10 Essential (primary) hypertension: Secondary | ICD-10-CM | POA: Diagnosis not present

## 2016-04-01 NOTE — Progress Notes (Signed)
Location:  [facility] Room Number: 105 Place of Service:  SNF (31) Provider:  Brunetta Newingham, Manxie  NP  Jeanmarie Hubert, MD  Patient Care Team: Estill Dooms, MD as PCP - General (Internal Medicine) Zebedee Iba, MD as Referring Physician (Ophthalmology) Darlin Coco, MD as Consulting Physician (Cardiology) Garvin Fila, MD as Consulting Physician (Neurology) Festus Aloe, MD as Consulting Physician (Urology) Marilynne Halsted, MD as Referring Physician (Ophthalmology) Lavonna Monarch, MD as Consulting Physician (Dermatology) Gerarda Fraction, MD as Referring Physician (Ophthalmology) Arin Vanosdol Otho Darner, NP as Nurse Practitioner (Internal Medicine)  Extended Emergency Contact Information Primary Emergency Contact: Farr,Laura Address: [address] [place] [address] Johnnette Litter of [place] Phone: [phone] Mobile Phone: [phone] Relation: Daughter Secondary Emergency Contact: Genia Plants [place] of [place] Phone: [phone] Relation: Daughter  Code Status:  Full Code Goals of care: Advanced Directive information Advanced Directives 04/01/2016  Does Patient Have a Medical Advance Directive? No  Would patient like information on creating a medical advance directive? No - Patient declined     Chief Complaint  Patient presents with  . Acute Visit    Patient is combative at times, attemted to bite staff on the arm, family wants all uneccesary medication D/C'ed    HPI:  Pt is a 83 y.o. male seen today for an acute visit for No sleeping at night, combative when redirected, belligerent , otherwise he is in his usual state of health.     Hx of anemia, s/p partial colectomy, last Hgb 12.8 1/30/18CAD has been stable, no angina since last visit, hypothyroidism taking Levothyroxine 134mcg, last TSH 3.44 12/05/15.HTN is controlled, his memory has been gradual decline but functioning well in SNF, off Namenda, peripheral  neuropathy has been stable, he walks with walker for short distance and w/c to go further. Trace edema BLE, on Furosemide 10mg , BNP 187 03/19/16   Past Medical History:  Diagnosis Date  . Abnormality of gait 07/04/2012  . Actinic keratoses   . Arthritis   . Benign prostatic hyperplasia   . BPH (benign prostatic hyperplasia)   . CAD (coronary artery disease) of artery bypass graft 12/02/2013  . CAD (coronary artery disease) of bypass graft    a. s/p CABG 1991 without subsequent interventions.  . Chorioretinitis   . Chronic diastolic CHF (congestive heart failure) ([facility])   . Colon cancer ([facility])    a. admitted to [facility] 12/07/14 with profound iron deficiency anemia Hgb 4.5, melena, with new diagnosis of invasive adenocarcinoma of colon s/p lap R colectomy on 12/15/14.  . Dementia   . Diverticulitis   . Dizziness and giddiness 07/04/2012  . Edema   . Essential hypertension   . Hereditary and idiopathic peripheral neuropathy 07/04/2012   a history of chronic sensory polyneuropathy of undetermined origin with abnormality of gait. He is walking with a rolling walker. Last MRA of the head January 2013 shows no significant stenosis, MRA of the neck shows mild stenosis of the proximal left internal carotid artery.    Marland Kitchen History of atrial fibrillation   . HLD (hyperlipidemia)   . HTN (hypertension) 12/01/2013  . Hyperlipidemia   . Hypothyroidism 12/01/2013  . Hypothyroidism   . 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 Internal carotid artery stenosis    left  . Iron deficiency anemia    a. 11/2014 - Hgb 4.5 in setting of newly dx colon CA.   . Memory loss   .  Mild left ventricular hypertrophy   . Neuromuscular disorder ([facility])   . PAF (paroxysmal atrial fibrillation) ([facility])    a. Not on anticoag due to bleeding and fall risk.  . Panuveitis   . Peripheral autonomic neuropathy in disorders classified elsewhere(337.1) 07/04/2012  . Prostatitis, acute    1991  . Retinitis   . Stroke ([facility])    . Thyroid disease   . Transient cerebral ischemia 07/04/2012   a posterior circulation TIA in October 2009.    . 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 APPENDECTOMY    . CARDIAC CATHETERIZATION    . COLON RESECTION N/A 12/15/2014   Procedure: Laparoscopic partial colectomy;  Surgeon: Autumn Messing III, MD;  Location: [facility] ORS;  Service: General;  Laterality: N/A;  . COLONOSCOPY WITH PROPOFOL N/A 12/12/2014   Procedure: COLONOSCOPY WITH PROPOFOL;  Surgeon: Gatha Mayer, MD;  Location: [facility] ENDOSCOPY;  Service: Endoscopy;  Laterality: N/A;  . CORONARY ARTERY BYPASS GRAFT  1991   grafts to LAD and RCA  . CORONARY ARTERY BYPASS GRAFT  1991    Allergies  Allergen Reactions  . Sulfa Antibiotics Other (See Comments)    unknown  . Ativan [Lorazepam] Anxiety    Allergies as of 04/01/2016      Reactions   Sulfa Antibiotics Other (See Comments)   unknown   Ativan [lorazepam] Anxiety      Medication List       Accurate as of 04/01/16  6:06 PM. Always use your most recent med list.          albuterol (2.5 MG/3ML) 0.083% nebulizer solution Commonly known as:  PROVENTIL Take 2.5 mg by nebulization every 6 (six) hours as needed for wheezing or shortness of breath.   amiodarone 200 MG tablet Commonly known as:  PACERONE Take 1 tablet (200 mg total) by mouth daily.   atorvastatin 40 MG tablet Commonly known as:  LIPITOR Take 40 mg by mouth at bedtime.   carvedilol 3.125 MG tablet Commonly known as:  COREG Take 1 tablet (3.125 mg total) by mouth 2 (two) times daily with a meal.   CEREFOLIN 07-19-48-5 MG Tabs Take 1 tablet by mouth daily.   FOLTANX 3-35-2 MG Tabs Take by mouth. Take one tablet daily   furosemide 20 MG tablet Commonly known as:  LASIX Take by mouth. Take 2 tablets = 40 mg daily.   Glucosamine-Chondroitin 750-600 MG Tabs Take 2 tablets by mouth daily.   levothyroxine 112 MCG tablet Commonly known as:   SYNTHROID, LEVOTHROID Take 112 mcg by mouth daily before breakfast.   multivitamin with minerals Tabs tablet Take 1 tablet by mouth daily. CERTA-VITE SENIOR   polyethylene glycol packet Commonly known as:  MIRALAX / GLYCOLAX Take 17 g by mouth daily.   polyvinyl alcohol 1.4 % ophthalmic solution Commonly known as:  LIQUIFILM TEARS Place 1 drop into both eyes 4 (four) times daily.   potassium chloride SA 20 MEQ tablet Commonly known as:  K-DUR,KLOR-CON Take 1 tablet (20 mEq total) by mouth daily.   REFRESH LACRI-LUBE Oint Apply to eye at bedtime.   tamsulosin 0.4 MG Caps capsule Commonly known as:  FLOMAX Take by mouth. Take two capsules (0.8mg ) daily       Review of Systems  Constitutional: Positive for fatigue. Negative for chills, diaphoresis and fever.  HENT: Positive for hearing loss. Negative for congestion and ear pain.   Eyes: Negative for pain, discharge and redness.  Respiratory: Negative for cough and shortness of breath.   Cardiovascular: Positive for leg swelling. Negative for chest pain and palpitations.  Gastrointestinal: Positive for constipation. Negative for abdominal pain, diarrhea, nausea and vomiting.  Genitourinary: Positive for frequency. Negative for dysuria and urgency.  Musculoskeletal: Positive for arthralgias, back pain and gait problem. Negative for myalgias and neck pain.       Ambulates with walker, w/c to go further. C/o right knee pain with walker, declined pain meds. Frequent falls.   Skin: Negative for rash.       Healed abd laparoscopic surgical scars.  Bleeding keratotic lesions of the superior aspect of the right pinna. Painless.  Neurological: Negative for dizziness, tremors, seizures, weakness and headaches.       Peripheral neuropathy  Psychiatric/Behavioral: Positive for confusion. Negative for decreased concentration, hallucinations and suicidal ideas. The patient is not nervous/anxious.        No sleeping at night, combative when  redirected, belligerent     Immunization History  Administered Date(s) Administered  . Influenza-Unspecified 12/22/2013, 11/09/2014, 12/05/2015  . PPD Test 01/03/2015  . Pneumococcal Polysaccharide-23 12/02/1993  . Tdap 01/08/2013  . Zoster 12/03/2007   Pertinent  Health Maintenance Due  Topic Date Due  . PNA vac Low Risk Adult (2 of 2 - PCV13) 12/03/1994  . INFLUENZA VACCINE  Completed   Fall Risk  02/10/2015 06/27/2014  Falls in the past year? Yes No  Number falls in past yr: 1 -  Injury with Fall? No -  Risk for fall due to : History of fall(s) Impaired balance/gait  Follow up Falls evaluation completed -   Functional Status Survey:    Vitals:   04/01/16 1202  BP: 140/68  Pulse: 68  Resp: 18  Temp: 97.9 F (36.6 C)  Weight: 190 lb 3.2 oz (86.3 kg)  Height: 6' (1.829 m)   Body mass index is 25.8 kg/m. Physical Exam  Constitutional: He appears well-developed and well-nourished. No distress.  HENT:  Head: Normocephalic and atraumatic.  Right Ear: External ear normal.  Left Ear: External ear normal.  Nose: Nose normal.  Mouth/Throat: Oropharynx is clear and moist. No oropharyngeal exudate.  Eyes: Conjunctivae and EOM are normal. Pupils are equal, round, and reactive to light. Right eye exhibits no discharge. Left eye exhibits no discharge. No scleral icterus.  Neck: Normal range of motion. Neck supple. No JVD present. No tracheal deviation present. No thyromegaly present.  Cardiovascular: Normal rate and regular rhythm.  Exam reveals no gallop and no friction rub.   Murmur heard. Systolic murmur [id number] right sternal border   Pulmonary/Chest: Effort normal. No stridor. No respiratory distress. He has no wheezes. He has rales. He exhibits no tenderness.  Abdominal: Soft. Bowel sounds are normal. He exhibits no distension and no mass. There is no tenderness.  Musculoskeletal: Normal range of motion. He exhibits edema and tenderness.  R knee pain with walking, no  erythema, swelling, patellar ballottement, or crepitus. Edema BLE R 1+, L trace.   Neurological: He is alert. He has normal reflexes. No cranial nerve deficit. He exhibits normal muscle tone. Coordination normal.  Skin: Skin is warm and dry. No rash noted. He is not diaphoretic. No erythema. No pallor.  Painless bleeding lesion in the superior aspect of the right and left  Psychiatric: He has a normal mood and affect. Thought content normal. Cognition and memory are impaired. He exhibits abnormal recent memory. He exhibits normal remote memory.  Memory deficits.     Labs  reviewed:  Recent Labs  05/19/15 1521  02/23/16 03/12/16 03/19/16  NA 141  < > 147 142 141  K 4.3  < > 3.8 3.7 3.9  CL 104  --   --   --   --   CO2 29  --   --   --   --   GLUCOSE 123*  --   --   --   --   BUN 25  < > 23* 20 19  CREATININE 1.40*  < > 1.2 1.2 1.2  CALCIUM 9.1  --   --   --   --   < > = values in this interval not displayed.  Recent Labs  10/06/15 01/16/16 03/19/16  AST 19 21 21   ALT 12 13 16   ALKPHOS 83 77 83    Recent Labs  05/19/15 1521  01/16/16 02/23/16 03/19/16  WBC 4.1  < > 5.2 8.0 4.4  NEUTROABS 2.5  --   --   --   --   HGB 12.3*  < > 12.8* 11.6* 12.8*  HCT 37.6*  < > 39* 36* 39*  MCV 94.0  --   --   --   --   PLT 189  < > 197 194 213  < > = values in this interval not displayed. Lab Results  Component Value Date   TSH 3.44 12/05/2015   Lab Results  Component Value Date   HGBA1C 5.9 (H) 12/15/2014   Lab Results  Component Value Date   CHOL 95 12/26/2015   HDL 42 12/26/2015   LDLCALC 46 12/26/2015   TRIG 37 (A) 12/26/2015   CHOLHDL 2.6 12/07/2014    Significant Diagnostic Results in last 30 days:  No results found.  Assessment/Plan Hypernatremia Last Na 141 03/19/16  Edema Improved, continue Furosemide 10mg , Kcl 28meq, 03/19/16 BNP 187   Alzheimer's disease Off Namenda, confusion, belligerent, combative when redirected, resides in [facility]. May adding Depakote if not  better.   Insomnia Not sleeping at night, will try Trazodone 25mg  qhs  Essential hypertension Controlled, continue Coreg 3.125mg  bid  PAF (paroxysmal atrial fibrillation) ([facility]) Heart rate is in control, continue Amiodarone 200mg  daily.   Chronic diastolic CHF (congestive heart failure) ([facility]) Clinically compensated, continue Furosemide 10mg  daily, 03/19/16 BNP 187  Constipation Stable since 01/17/16 changed Miralax to prn  Hypothyroidism 10/09/15 increase Levothyroxine to 192mcg daily 12/05/15 TSH 3.44   Hereditary and idiopathic peripheral neuropathy Lower body weakness, ambulates with walker for short distance and w/c to go further. Tylenol bid  Chronic kidney disease 03/12/16 Bun 20, creat 1.18  Benign prostatic hyperplasia No urinary retention. cotninueTamsulosin 0.8 mg daily.   Adult failure to thrive [facility] palliative care referral is POA desires.      Family/ staff Communication: SNF [facility] palliative care referral.   Labs/tests ordered: none

## 2016-04-01 NOTE — Assessment & Plan Note (Signed)
Controlled, continue Coreg 3.125mg  bid

## 2016-04-01 NOTE — Assessment & Plan Note (Addendum)
Improved, continue Furosemide 10mg, Kcl 10meq, 03/19/16 BNP 187  

## 2016-04-01 NOTE — Assessment & Plan Note (Signed)
Clinically compensated, continue Furosemide 10mg daily, 03/19/16 BNP 187  

## 2016-04-01 NOTE — Assessment & Plan Note (Addendum)
Off Namenda, confusion, belligerent, combative when redirected, resides in [facility]. May adding Depakote if not better.

## 2016-04-01 NOTE — Assessment & Plan Note (Signed)
Last Na 141 03/19/16

## 2016-04-01 NOTE — Assessment & Plan Note (Signed)
10/09/15 increase Levothyroxine to 112mcg daily 12/05/15 TSH 3.44  

## 2016-04-01 NOTE — Assessment & Plan Note (Signed)
No urinary retention. cotninueTamsulosin 0.8 mg daily.  

## 2016-04-01 NOTE — Assessment & Plan Note (Signed)
03/12/16 Bun 20, creat 1.18

## 2016-04-01 NOTE — Assessment & Plan Note (Signed)
Stable since 01/17/16 changed Miralax to prn  

## 2016-04-01 NOTE — Assessment & Plan Note (Signed)
Not sleeping at night, will try Trazodone 25mg  qhs

## 2016-04-01 NOTE — Assessment & Plan Note (Signed)
Heart rate is in control, continue Amiodarone 200mg daily.  

## 2016-04-01 NOTE — Assessment & Plan Note (Addendum)
Lower body weakness, ambulates with walker for short distance and w/c to go further. Tylenol bid

## 2016-04-01 NOTE — Assessment & Plan Note (Signed)
[facility] palliative care referral is POA desires.

## 2016-04-12 ENCOUNTER — Encounter: Payer: Self-pay | Admitting: Internal Medicine

## 2016-04-12 ENCOUNTER — Non-Acute Institutional Stay (SKILLED_NURSING_FACILITY): Payer: Medicare Other | Admitting: Internal Medicine

## 2016-04-12 DIAGNOSIS — R609 Edema, unspecified: Secondary | ICD-10-CM

## 2016-04-12 DIAGNOSIS — F0281 Dementia in other diseases classified elsewhere with behavioral disturbance: Secondary | ICD-10-CM

## 2016-04-12 DIAGNOSIS — E039 Hypothyroidism, unspecified: Secondary | ICD-10-CM

## 2016-04-12 DIAGNOSIS — G301 Alzheimer's disease with late onset: Secondary | ICD-10-CM

## 2016-04-12 DIAGNOSIS — D500 Iron deficiency anemia secondary to blood loss (chronic): Secondary | ICD-10-CM | POA: Diagnosis not present

## 2016-04-12 DIAGNOSIS — I10 Essential (primary) hypertension: Secondary | ICD-10-CM | POA: Diagnosis not present

## 2016-04-12 NOTE — Progress Notes (Signed)
Progress Note    Location:  [facility] Room Number: N105 Place of Service:  SNF [phone]) Provider:  Jeanmarie Hubert, MD  Patient Care Team: Estill Dooms, MD as PCP - General (Internal Medicine) Zebedee Iba, MD as Referring Physician (Ophthalmology) Darlin Coco, MD as Consulting Physician (Cardiology) Garvin Fila, MD as Consulting Physician (Neurology) Festus Aloe, MD as Consulting Physician (Urology) Marilynne Halsted, MD as Referring Physician (Ophthalmology) Lavonna Monarch, MD as Consulting Physician (Dermatology) Gerarda Fraction, MD as Referring Physician (Ophthalmology) Man Otho Darner, NP as Nurse Practitioner (Internal Medicine)  Extended Emergency Contact Information Primary Emergency Contact: Farr,Laura Address: [address] [place] [address] Johnnette Litter of [place] Phone: [phone] Mobile Phone: [phone] Relation: Daughter Secondary Emergency Contact: Genia Plants States of [place] Phone: [phone] Relation: Daughter  Code Status:  Full Code Goals of care: Advanced Directive information Advanced Directives 04/12/2016  Does Patient Have a Medical Advance Directive? No  Would patient like information on creating a medical advance directive? -     Chief Complaint  Patient presents with  . Medical Management of Chronic Issues    routine visit    HPI:  Pt is a 83 y.o. male seen today for medical management of chronic diseases.     Past Medical History:  Diagnosis Date  . Abnormality of gait 07/04/2012  . Actinic keratoses   . Arthritis   . Benign prostatic hyperplasia   . BPH (benign prostatic hyperplasia)   . CAD (coronary artery disease) of artery bypass graft 12/02/2013  . CAD (coronary artery disease) of bypass graft    a. s/p CABG 1991 without subsequent interventions.  . Chorioretinitis   . Chronic diastolic CHF (congestive heart failure) ([place])   . Colon cancer ([place])    a.  admitted to [facility] 12/07/14 with profound iron deficiency anemia Hgb 4.5, melena, with new diagnosis of invasive adenocarcinoma of colon s/p lap R colectomy on 12/15/14.  . Dementia   . Diverticulitis   . Dizziness and giddiness 07/04/2012  . Edema   . Essential hypertension   . Hereditary and idiopathic peripheral neuropathy 07/04/2012   a history of chronic sensory polyneuropathy of undetermined origin with abnormality of gait. He is walking with a rolling walker. Last MRA of the head January 2013 shows no significant stenosis, MRA of the neck shows mild stenosis of the proximal left internal carotid artery.    Marland Kitchen History of atrial fibrillation   . HLD (hyperlipidemia)   . HTN (hypertension) 12/01/2013  . Hyperlipidemia   . Hypothyroidism 12/01/2013  . Hypothyroidism   . 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 Internal carotid artery stenosis    left  . Iron deficiency anemia    a. 11/2014 - Hgb 4.5 in setting of newly dx colon CA.   . Memory loss   . Mild left ventricular hypertrophy   . Neuromuscular disorder ([place])   . PAF (paroxysmal atrial fibrillation) ([place])    a. Not on anticoag due to bleeding and fall risk.  . Panuveitis   . Peripheral autonomic neuropathy in disorders classified elsewhere(337.1) 07/04/2012  . Prostatitis, acute    1991  . Retinitis   . Stroke ([place])   . Thyroid disease   . Transient cerebral ischemia 07/04/2012   a posterior circulation TIA in October 2009.    . 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 APPENDECTOMY    . CARDIAC CATHETERIZATION    . COLON RESECTION N/A 12/15/2014   Procedure: Laparoscopic partial colectomy;  Surgeon: Autumn Messing III, MD;  Location: [facility];  Service: General;  Laterality: N/A;  . COLONOSCOPY WITH PROPOFOL N/A 12/12/2014   Procedure: COLONOSCOPY WITH PROPOFOL;  Surgeon: Gatha Mayer, MD;  Location[facility] END[facility];  Service: Endoscopy;  Laterality: N/A;  .  CORONARY ARTERY BYPASS GRAFT  1991   grafts to LAD and RCA  . CORONARY ARTERY BYPASS GRAFT  1991    Allergies  Allergen Reactions  . Sulfa Antibiotics Other (See Comments)    unknown  . Ativan [Lorazepam] Anxiety    Allergies as of 04/12/2016      Reactions   Sulfa Antibiotics Other (See Comments)   unknown   Ativan [lorazepam] Anxiety      Medication List       Accurate as of 04/12/16  2:28 PM. Always use your most recent med list.          acetaminophen 325 MG tablet Commonly known as:  TYLENOL Take 650 mg by mouth. Take 2 tablets twice daily. Take 2 every 6 hours as needed for mild pain   amiodarone 200 MG tablet Commonly known as:  PACERONE Take 1 tablet (200 mg total) by mouth daily.   carvedilol 3.125 MG tablet Commonly known as:  COREG Take 1 tablet (3.125 mg total) by mouth 2 (two) times daily with a meal.   furosemide 20 MG tablet Commonly known as:  LASIX Take by mouth. Take 1/2 tablet (10mg ) daily   levothyroxine 112 MCG tablet Commonly known as:  SYNTHROID, LEVOTHROID Take 112 mcg by mouth daily before breakfast.   polyethylene glycol packet Commonly known as:  MIRALAX / GLYCOLAX Take 17 g by mouth daily.   polyvinyl alcohol 1.4 % ophthalmic solution Commonly known as:  LIQUIFILM TEARS Place 1 drop into both eyes 4 (four) times daily.   potassium chloride 10 MEQ tablet Commonly known as:  K-DUR Take 10 mEq by mouth. Take one tablet daily   REFRESH LACRI-LUBE Oint Apply to eye at bedtime.   tamsulosin 0.4 MG Caps capsule Commonly known as:  FLOMAX Take by mouth. Take two capsules (0.8mg ) daily   traZODone 50 MG tablet Commonly known as:  DESYREL Take 50 mg by mouth. Take 1/2 tablet (25mg ) at bedtime for sleep       Review of Systems  Constitutional: Positive for fatigue. Negative for chills, diaphoresis and fever.  HENT: Positive for hearing loss. Negative for congestion and ear pain.   Eyes: Negative for pain, discharge and  redness.  Respiratory: Negative for cough and shortness of breath.   Cardiovascular: Positive for leg swelling. Negative for chest pain and palpitations.  Gastrointestinal: Positive for constipation. Negative for abdominal pain, diarrhea, nausea and vomiting.  Genitourinary: Positive for frequency. Negative for dysuria and urgency.  Musculoskeletal: Positive for arthralgias, back pain and gait problem. Negative for myalgias and neck pain.       Ambulates with walker, w/c to go further. C/o right knee pain with walker, declined pain meds. Frequent falls.   Skin: Negative for rash.       Healed abd laparoscopic surgical scars.  Bleeding keratotic lesions of the superior aspect of the right pinna. Painless.  Neurological: Negative for dizziness, tremors, seizures, weakness and headaches.       Peripheral neuropathy  Psychiatric/Behavioral: Positive for confusion. Negative for decreased concentration, hallucinations and suicidal ideas. The  patient is not nervous/anxious.        No sleeping at night, combative when redirected, belligerent     Immunization History  Administered Date(s) Administered  . Influenza-Unspecified 12/22/2013, 11/09/2014, 12/05/2015  . PPD Test 01/03/2015  . Pneumococcal Polysaccharide-23 12/02/1993  . Tdap 01/08/2013  . Zoster 12/03/2007   Pertinent  Health Maintenance Due  Topic Date Due  . PNA vac Low Risk Adult (2 of 2 - PCV13) 12/03/1994  . INFLUENZA VACCINE  Completed   Fall Risk  02/10/2015 06/27/2014  Falls in the past year? Yes No  Number falls in past yr: 1 -  Injury with Fall? No -  Risk for fall due to : History of fall(s) Impaired balance/gait  Follow up Falls evaluation completed -   Functional Status Survey:    Vitals:   04/12/16 1416  BP: (!) 142/80  Pulse: 72  Resp: 17  Temp: 97.9 F (36.6 C)  Weight: 190 lb (86.2 kg)  Height: 6' (1.829 m)   Body mass index is 25.77 kg/m. Physical Exam  Constitutional: He appears well-developed and  well-nourished. No distress.  HENT:  Head: Normocephalic and atraumatic.  Right Ear: External ear normal.  Left Ear: External ear normal.  Nose: Nose normal.  Mouth/Throat: Oropharynx is clear and moist. No oropharyngeal exudate.  Eyes: Conjunctivae and EOM are normal. Pupils are equal, round, and reactive to light. Right eye exhibits no discharge. Left eye exhibits no discharge. No scleral icterus.  Neck: Normal range of motion. Neck supple. No JVD present. No tracheal deviation present. No thyromegaly present.  Cardiovascular: Normal rate and regular rhythm.  Exam reveals no gallop and no friction rub.   Murmur heard. Systolic murmur [id number] right sternal border   Pulmonary/Chest: Effort normal. No stridor. No respiratory distress. He has no wheezes. He has rales. He exhibits no tenderness.  Abdominal: Soft. Bowel sounds are normal. He exhibits no distension and no mass. There is no tenderness.  Musculoskeletal: Normal range of motion. He exhibits edema and tenderness.  R knee pain with walking, no erythema, swelling, patellar ballottement, or crepitus. Edema BLE R 1+, L trace.   Neurological: He is alert. He has normal reflexes. No cranial nerve deficit. He exhibits normal muscle tone. Coordination normal.  Skin: Skin is warm and dry. No rash noted. He is not diaphoretic. No erythema. No pallor.  Painless bleeding lesion in the superior aspect of the right and left  Psychiatric: He has a normal mood and affect. Thought content normal. Cognition and memory are impaired. He exhibits abnormal recent memory. He exhibits normal remote memory.  Memory deficits.     Labs reviewed:  Recent Labs  05/19/15 1521  02/23/16 03/12/16 03/19/16  NA 141  < > 147 142 141  K 4.3  < > 3.8 3.7 3.9  CL 104  --   --   --   --   CO2 29  --   --   --   --   GLUCOSE 123*  --   --   --   --   BUN 25  < > 23* 20 19  CREATININE 1.40*  < > 1.2 1.2 1.2  CALCIUM 9.1  --   --   --   --   < > = values in this  interval not displayed.  Recent Labs  10/06/15 01/16/16 03/19/16  AST 19 21 21   ALT 12 13 16   ALKPHOS 83 77 83    Recent Labs  05/19/15 1521  01/16/16 02/23/16 03/19/16  WBC 4.1  < > 5.2 8.0 4.4  NEUTROABS 2.5  --   --   --   --   HGB 12.3*  < > 12.8* 11.6* 12.8*  HCT 37.6*  < > 39* 36* 39*  MCV 94.0  --   --   --   --   PLT 189  < > 197 194 213  < > = values in this interval not displayed. Lab Results  Component Value Date   TSH 3.44 12/05/2015   Lab Results  Component Value Date   HGBA1C 5.9 (H) 12/15/2014   Lab Results  Component Value Date   CHOL 95 12/26/2015   HDL 42 12/26/2015   LDLCALC 46 12/26/2015   TRIG 37 (A) 12/26/2015   CHOLHDL 2.6 12/07/2014   Assessment/Plan 1. Late onset Alzheimer's disease with behavioral disturbance Slow progressive decline in memory. Risk for wandering caused admission to Memory Care Unit  2. Iron deficiency anemia due to chronic blood loss Stable to improved  3. Essential hypertension controlled  4. Edema, unspecified type Stable trace edema of ankles  5. Hypothyroidism, unspecified type compensated

## 2016-04-19 DIAGNOSIS — R531 Weakness: Secondary | ICD-10-CM | POA: Diagnosis not present

## 2016-05-01 DIAGNOSIS — R531 Weakness: Secondary | ICD-10-CM | POA: Diagnosis not present

## 2016-05-08 ENCOUNTER — Encounter: Payer: Self-pay | Admitting: Nurse Practitioner

## 2016-05-08 ENCOUNTER — Non-Acute Institutional Stay (SKILLED_NURSING_FACILITY): Payer: Medicare Other | Admitting: Nurse Practitioner

## 2016-05-08 DIAGNOSIS — G301 Alzheimer's disease with late onset: Secondary | ICD-10-CM | POA: Diagnosis not present

## 2016-05-08 DIAGNOSIS — N182 Chronic kidney disease, stage 2 (mild): Secondary | ICD-10-CM | POA: Diagnosis not present

## 2016-05-08 DIAGNOSIS — E870 Hyperosmolality and hypernatremia: Secondary | ICD-10-CM | POA: Diagnosis not present

## 2016-05-08 DIAGNOSIS — K5900 Constipation, unspecified: Secondary | ICD-10-CM

## 2016-05-08 DIAGNOSIS — R609 Edema, unspecified: Secondary | ICD-10-CM

## 2016-05-08 DIAGNOSIS — R627 Adult failure to thrive: Secondary | ICD-10-CM

## 2016-05-08 DIAGNOSIS — F02818 Dementia in other diseases classified elsewhere, unspecified severity, with other behavioral disturbance: Secondary | ICD-10-CM

## 2016-05-08 DIAGNOSIS — N400 Enlarged prostate without lower urinary tract symptoms: Secondary | ICD-10-CM | POA: Diagnosis not present

## 2016-05-08 DIAGNOSIS — E039 Hypothyroidism, unspecified: Secondary | ICD-10-CM | POA: Diagnosis not present

## 2016-05-08 DIAGNOSIS — F5101 Primary insomnia: Secondary | ICD-10-CM | POA: Diagnosis not present

## 2016-05-08 DIAGNOSIS — I10 Essential (primary) hypertension: Secondary | ICD-10-CM

## 2016-05-08 DIAGNOSIS — Z8679 Personal history of other diseases of the circulatory system: Secondary | ICD-10-CM

## 2016-05-08 DIAGNOSIS — D500 Iron deficiency anemia secondary to blood loss (chronic): Secondary | ICD-10-CM

## 2016-05-08 DIAGNOSIS — F0281 Dementia in other diseases classified elsewhere with behavioral disturbance: Secondary | ICD-10-CM

## 2016-05-08 DIAGNOSIS — I5032 Chronic diastolic (congestive) heart failure: Secondary | ICD-10-CM

## 2016-05-08 NOTE — Assessment & Plan Note (Signed)
Improved, continue Furosemide 10mg , Kcl 33meq, 03/19/16 BNP 187

## 2016-05-08 NOTE — Assessment & Plan Note (Signed)
Heart rate is in control, continue Amiodarone 200mg  daily.

## 2016-05-08 NOTE — Assessment & Plan Note (Signed)
Stable since 01/17/16 changed Miralax to prn

## 2016-05-08 NOTE — Progress Notes (Signed)
Location:  [facility] Room Number: 105 Place of Service:  SNF (31) Provider:  Mast, Manxie  NP  Jeanmarie Hubert, MD  Patient Care Team: Estill Dooms, MD as PCP - General (Internal Medicine) Zebedee Iba, MD as Referring Physician (Ophthalmology) Darlin Coco, MD as Consulting Physician (Cardiology) Garvin Fila, MD as Consulting Physician (Neurology) Festus Aloe, MD as Consulting Physician (Urology) Marilynne Halsted, MD as Referring Physician (Ophthalmology) Lavonna Monarch, MD as Consulting Physician (Dermatology) Gerarda Fraction, MD as Referring Physician (Ophthalmology) Man Otho Darner, NP as Nurse Practitioner (Internal Medicine)  Extended Emergency Contact Information Primary Emergency Contact: Farr,Laura Address: [address] [place] [address] Johnnette Litter of [place] Phone: [phone] Mobile Phone: [phone] Relation: Daughter Secondary Emergency Contact: Genia Plants States of [place] Phone: [phone] Relation: Daughter  Code Status:  Full Code Goals of care: Advanced Directive information Advanced Directives 05/08/2016  Does Patient Have a Medical Advance Directive? No  Would patient like information on creating a medical advance directive? No - Patient declined     Chief Complaint  Patient presents with  . Medical Management of Chronic Issues    HPI:  Pt is a 83 y.o. male seen today for medical management of chronic diseases.      Hx of anemia, s/p partial colectomy, last Hgb 12.8 1/30/18CAD has been stable, no angina since last visit, hypothyroidism taking Levothyroxine 126mcg, last TSH 3.44 12/05/15.HTN is controlled, his memory has been gradual decline but functioning well in SNF, off Namenda, peripheral neuropathy has been stable, he walks with walker for short distance and w/c to go further. Trace edema BLE, on Furosemide 10mg , BNP 187 03/19/16. His mood is stable on Trazodone 25mg   nightly.  Past Medical History:  Diagnosis Date  . Abnormality of gait 07/04/2012  . Actinic keratoses   . Arthritis   . Benign prostatic hyperplasia   . BPH (benign prostatic hyperplasia)   . CAD (coronary artery disease) of artery bypass graft 12/02/2013  . CAD (coronary artery disease) of bypass graft    a. s/p CABG 1991 without subsequent interventions.  . Chorioretinitis   . Chronic diastolic CHF (congestive heart failure) ([place])   . Colon cancer ([place])    a. admitted to [facility] 12/07/14 with profound iron deficiency anemia Hgb 4.5, melena, with new diagnosis of invasive adenocarcinoma of colon s/p lap R colectomy on 12/15/14.  . Dementia   . Diverticulitis   . Dizziness and giddiness 07/04/2012  . Edema   . Essential hypertension   . Hereditary and idiopathic peripheral neuropathy 07/04/2012   a history of chronic sensory polyneuropathy of undetermined origin with abnormality of gait. He is walking with a rolling walker. Last MRA of the head January 2013 shows no significant stenosis, MRA of the neck shows mild stenosis of the proximal left internal carotid artery.    Marland Kitchen History of atrial fibrillation   . HLD (hyperlipidemia)   . HTN (hypertension) 12/01/2013  . Hyperlipidemia   . Hypothyroidism 12/01/2013  . Hypothyroidism   . 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 Internal carotid artery stenosis    left  . Iron deficiency anemia    a. 11/2014 - Hgb 4.5 in setting of newly dx colon CA.   . Memory loss   . Mild left ventricular hypertrophy   . Neuromuscular disorder ([place])   . PAF (paroxysmal atrial fibrillation) ([place])    a. Not on  anticoag due to bleeding and fall risk.  . Panuveitis   . Peripheral autonomic neuropathy in disorders classified elsewhere(337.1) 07/04/2012  . Prostatitis, acute    1991  . Retinitis   . Stroke ([place])   . Thyroid disease   . Transient cerebral ischemia 07/04/2012   a posterior circulation TIA in October 2009.    . Vertebrobasilar  artery syndrome 07/04/2012   Past Surgical History:  Procedure Laterality Date  . APPENDECTOMY    . CARDIAC CATHETERIZATION    . COLON RESECTION N/A 12/15/2014   Procedure: Laparoscopic partial colectomy;  Surgeon: Autumn Messing III, MD;  [facility];  Service: General;  Laterality: N/A;  . COLONOSCOPY WITH PROPOFOL N/A 12/12/2014   Procedure: COLONOSCOPY WITH PROPOFOL;  Surgeon: Gatha Mayer, MD;  [facility];  Service: Endoscopy;  Laterality: N/A;  . CORONARY ARTERY BYPASS GRAFT  1991   grafts to LAD and RCA  . CORONARY ARTERY BYPASS GRAFT  1991    Allergies  Allergen Reactions  . Sulfa Antibiotics Other (See Comments)    unknown  . Ativan [Lorazepam] Anxiety    Allergies as of 05/08/2016      Reactions   Sulfa Antibiotics Other (See Comments)   unknown   Ativan [lorazepam] Anxiety      Medication List       Accurate as of 05/08/16  4:24 PM. Always use your most recent med list.          acetaminophen 325 MG tablet Commonly known as:  TYLENOL Take 650 mg by mouth. Take 2 tablets twice daily. Take 2 every 6 hours as needed for mild pain   amiodarone 200 MG tablet Commonly known as:  PACERONE Take 1 tablet (200 mg total) by mouth daily.   carvedilol 3.125 MG tablet Commonly known as:  COREG Take 1 tablet (3.125 mg total) by mouth 2 (two) times daily with a meal.   furosemide 20 MG tablet Commonly known as:  LASIX Take by mouth. Take 1/2 tablet (10mg ) daily   levothyroxine 112 MCG tablet Commonly known as:  SYNTHROID, LEVOTHROID Take 112 mcg by mouth daily before breakfast.   polyethylene glycol packet Commonly known as:  MIRALAX / GLYCOLAX Take 17 g by mouth daily.   polyvinyl alcohol 1.4 % ophthalmic solution Commonly known as:  LIQUIFILM TEARS Place 1 drop into both eyes 4 (four) times daily.   potassium chloride 10 MEQ tablet Commonly known as:  K-DUR Take 10 mEq by mouth. Take one tablet daily   REFRESH LACRI-LUBE Oint Apply to eye  at bedtime.   tamsulosin 0.4 MG Caps capsule Commonly known as:  FLOMAX Take by mouth. Take two capsules (0.8mg ) daily   traZODone 50 MG tablet Commonly known as:  DESYREL Take 50 mg by mouth. Take 1/2 tablet (25mg ) at bedtime for sleep       Review of Systems  Constitutional: Positive for fatigue. Negative for chills, diaphoresis and fever.  HENT: Positive for hearing loss. Negative for congestion and ear pain.   Eyes: Negative for pain, discharge and redness.  Respiratory: Negative for cough and shortness of breath.   Cardiovascular: Positive for leg swelling. Negative for chest pain and palpitations.  Gastrointestinal: Positive for constipation. Negative for abdominal pain, diarrhea, nausea and vomiting.  Genitourinary: Positive for frequency. Negative for dysuria and urgency.  Musculoskeletal: Positive for arthralgias, back pain and gait problem. Negative for myalgias and neck pain.       Ambulates with walker, w/c to go  further. C/o right knee pain with walker, declined pain meds. Frequent falls.   Skin: Negative for rash.       Healed abd laparoscopic surgical scars.  Bleeding keratotic lesions of the superior aspect of the right pinna. Painless.  Neurological: Negative for dizziness, tremors, seizures, weakness and headaches.       Peripheral neuropathy  Psychiatric/Behavioral: Positive for confusion. Negative for decreased concentration, hallucinations and suicidal ideas. The patient is not nervous/anxious.        No sleeping at night, combative when redirected, belligerent     Immunization History  Administered Date(s) Administered  . Influenza-Unspecified 12/22/2013, 11/09/2014, 12/05/2015  . PPD Test 01/03/2015  . Pneumococcal Polysaccharide-23 12/02/1993  . Tdap 01/08/2013  . Zoster 12/03/2007   Pertinent  Health Maintenance Due  Topic Date Due  . PNA vac Low Risk Adult (2 of 2 - PCV13) 12/03/1994  . INFLUENZA VACCINE  Completed   Fall Risk  02/10/2015  06/27/2014  Falls in the past year? Yes No  Number falls in past yr: 1 -  Injury with Fall? No -  Risk for fall due to : History of fall(s) Impaired balance/gait  Follow up Falls evaluation completed -   Functional Status Survey:    Vitals:   05/08/16 1353  BP: 138/70  Pulse: 74  Resp: 18  Temp: 97.2 F (36.2 C)  Weight: 187 lb 3.2 oz (84.9 kg)  Height: 6' (1.829 m)   Body mass index is 25.39 kg/m. Physical Exam  Constitutional: He appears well-developed and well-nourished. No distress.  HENT:  Head: Normocephalic and atraumatic.  Right Ear: External ear normal.  Left Ear: External ear normal.  Nose: Nose normal.  Mouth/Throat: Oropharynx is clear and moist. No oropharyngeal exudate.  Eyes: Conjunctivae and EOM are normal. Pupils are equal, round, and reactive to light. Right eye exhibits no discharge. Left eye exhibits no discharge. No scleral icterus.  Neck: Normal range of motion. Neck supple. No JVD present. No tracheal deviation present. No thyromegaly present.  Cardiovascular: Normal rate and regular rhythm.  Exam reveals no gallop and no friction rub.   Murmur heard. Systolic murmur 7-6/5 right sternal border   Pulmonary/Chest: Effort normal. No stridor. No respiratory distress. He has no wheezes. He has rales. He exhibits no tenderness.  Abdominal: Soft. Bowel sounds are normal. He exhibits no distension and no mass. There is no tenderness.  Musculoskeletal: Normal range of motion. He exhibits edema and tenderness.  R knee pain with walking, no erythema, swelling, patellar ballottement, or crepitus. Edema BLE R 1+, L trace.   Neurological: He is alert. He has normal reflexes. No cranial nerve deficit. He exhibits normal muscle tone. Coordination normal.  Skin: Skin is warm and dry. No rash noted. He is not diaphoretic. No erythema. No pallor.  Painless bleeding lesion in the superior aspect of the right and left  Psychiatric: He has a normal mood and affect. Thought  content normal. Cognition and memory are impaired. He exhibits abnormal recent memory. He exhibits normal remote memory.  Memory deficits.     Labs reviewed:  Recent Labs  05/19/15 1521  02/23/16 03/12/16 03/19/16  NA 141  < > 147 142 141  K 4.3  < > 3.8 3.7 3.9  CL 104  --   --   --   --   CO2 29  --   --   --   --   GLUCOSE 123*  --   --   --   --  BUN 25  < > 23* 20 19  CREATININE 1.40*  < > 1.2 1.2 1.2  CALCIUM 9.1  --   --   --   --   < > = values in this interval not displayed.  Recent Labs  10/06/15 01/16/16 03/19/16  AST 19 21 21   ALT 12 13 16   ALKPHOS 83 77 83    Recent Labs  05/19/15 1521  01/16/16 02/23/16 03/19/16  WBC 4.1  < > 5.2 8.0 4.4  NEUTROABS 2.5  --   --   --   --   HGB 12.3*  < > 12.8* 11.6* 12.8*  HCT 37.6*  < > 39* 36* 39*  MCV 94.0  --   --   --   --   PLT 189  < > 197 194 213  < > = values in this interval not displayed. Lab Results  Component Value Date   TSH 3.44 12/05/2015   Lab Results  Component Value Date   HGBA1C 5.9 (H) 12/15/2014   Lab Results  Component Value Date   CHOL 95 12/26/2015   HDL 42 12/26/2015   LDLCALC 46 12/26/2015   TRIG 37 (A) 12/26/2015   CHOLHDL 2.6 12/07/2014    Significant Diagnostic Results in last 30 days:  No results found.  Assessment/Plan Insomnia Sleeps better at night, continueTrazodone 25mg  qhs  Adult failure to thrive Supportive measures.   Hypernatremia Resolved, last Na 141 03/19/16  Anemia, iron deficiency Stable, last Hgb 12.8 03/19/16  Edema Improved, continue Furosemide 10mg , Kcl 77meq, 03/19/16 BNP 187   History of atrial fibrillation Heart rate is in control, continue Amiodarone 200mg  daily.   Benign prostatic hyperplasia No urinary retention. cotninueTamsulosin 0.8 mg daily.   Chronic kidney disease 03/12/16 creat 1.18, continue Furosemide 10mg   Alzheimer's disease Off Namenda, confusion at his baseline, continue SNF MCU  Hypothyroidism 10/09/15 increase  Levothyroxine to 143mcg daily 12/05/15 TSH 3.44   Constipation Stable since 01/17/16 changed Miralax to prn   Chronic diastolic CHF (congestive heart failure) ([place]) Clinically compensated, continue Furosemide 10mg  daily, 03/19/16 BNP 187   Essential hypertension Controlled, continue Coreg 3.125mg  bid, Furosemide 10mg  daily.      Family/ staff Communication: SNF MCU  Labs/tests ordered:  none

## 2016-05-08 NOTE — Assessment & Plan Note (Signed)
10/09/15 increase Levothyroxine to 141mcg daily 12/05/15 TSH 3.44

## 2016-05-08 NOTE — Assessment & Plan Note (Signed)
No urinary retention. cotninueTamsulosin 0.8 mg daily.

## 2016-05-08 NOTE — Assessment & Plan Note (Signed)
Sleeps better at night, continueTrazodone 25mg  qhs

## 2016-05-08 NOTE — Assessment & Plan Note (Signed)
Clinically compensated, continue Furosemide 10mg  daily, 03/19/16 BNP 187

## 2016-05-08 NOTE — Assessment & Plan Note (Signed)
03/12/16 creat 1.18, continue Furosemide 10mg 

## 2016-05-08 NOTE — Assessment & Plan Note (Signed)
Controlled, continue Coreg 3.125mg  bid, Furosemide 10mg  daily.

## 2016-05-08 NOTE — Assessment & Plan Note (Signed)
Supportive measures 

## 2016-05-08 NOTE — Assessment & Plan Note (Signed)
Resolved, last Na 141 03/19/16

## 2016-05-08 NOTE — Assessment & Plan Note (Signed)
Stable, last Hgb 12.8 03/19/16

## 2016-05-08 NOTE — Assessment & Plan Note (Signed)
Off Namenda, confusion at his baseline, continue SNF MCU

## 2016-05-18 ENCOUNTER — Encounter (HOSPITAL_COMMUNITY): Payer: Self-pay

## 2016-05-18 ENCOUNTER — Emergency Department (HOSPITAL_COMMUNITY)
Admission: EM | Admit: 2016-05-18 | Discharge: 2016-05-18 | Disposition: A | Discharge status: Home / Self Care | Payer: Medicare Other | Attending: Emergency Medicine | Admitting: Emergency Medicine

## 2016-05-18 DIAGNOSIS — E039 Hypothyroidism, unspecified: Secondary | ICD-10-CM | POA: Diagnosis not present

## 2016-05-18 DIAGNOSIS — Z951 Presence of aortocoronary bypass graft: Secondary | ICD-10-CM | POA: Insufficient documentation

## 2016-05-18 DIAGNOSIS — F0390 Unspecified dementia without behavioral disturbance: Secondary | ICD-10-CM | POA: Diagnosis present

## 2016-05-18 DIAGNOSIS — I2510 Atherosclerotic heart disease of native coronary artery without angina pectoris: Secondary | ICD-10-CM | POA: Diagnosis not present

## 2016-05-18 DIAGNOSIS — Z85038 Personal history of other malignant neoplasm of large intestine: Secondary | ICD-10-CM | POA: Insufficient documentation

## 2016-05-18 DIAGNOSIS — R402441 Other coma, without documented Glasgow coma scale score, or with partial score reported, in the field [EMT or ambulance]: Secondary | ICD-10-CM | POA: Diagnosis not present

## 2016-05-18 DIAGNOSIS — I110 Hypertensive heart disease with heart failure: Secondary | ICD-10-CM | POA: Diagnosis not present

## 2016-05-18 DIAGNOSIS — I5032 Chronic diastolic (congestive) heart failure: Secondary | ICD-10-CM | POA: Insufficient documentation

## 2016-05-18 DIAGNOSIS — R410 Disorientation, unspecified: Secondary | ICD-10-CM | POA: Diagnosis not present

## 2016-05-18 DIAGNOSIS — Z87891 Personal history of nicotine dependence: Secondary | ICD-10-CM | POA: Insufficient documentation

## 2016-05-18 DIAGNOSIS — R451 Restlessness and agitation: Secondary | ICD-10-CM | POA: Diagnosis not present

## 2016-05-18 DIAGNOSIS — G309 Alzheimer's disease, unspecified: Secondary | ICD-10-CM | POA: Diagnosis not present

## 2016-05-18 DIAGNOSIS — Z79899 Other long term (current) drug therapy: Secondary | ICD-10-CM | POA: Insufficient documentation

## 2016-05-18 DIAGNOSIS — Z0000 Encounter for general adult medical examination without abnormal findings: Secondary | ICD-10-CM

## 2016-05-18 DIAGNOSIS — Z8673 Personal history of transient ischemic attack (TIA), and cerebral infarction without residual deficits: Secondary | ICD-10-CM | POA: Insufficient documentation

## 2016-05-18 NOTE — ED Provider Notes (Signed)
[facility] DEPT Provider Note   CSN: [id number] Arrival date & time: 05/18/16  1754  By signing my name below, I, Reola Mosher, attest that this documentation has been prepared under the direction and in the presence of Virgel Manifold, MD. Electronically Signed: Reola Mosher, ED Scribe. 05/18/16. 6:30 PM.  History   Chief Complaint Chief Complaint  Patient presents with  . Altered Mental Status   LEVEL V CAVEAT: HPI and ROS limited due to Alzheimer's dementia   The history is provided by the patient. History limited by: Alzheimer's dementia. No language interpreter was used.    HPI Comments: Jesse Burton is a 83 y.o. male BIB EMS from [facility], with a PMHx of Alzheimer's disease, CAD s/p CABGx2, CHF, HTN/HLD, CKD, anemia, and colon cancer, who presents to the Emergency Department for evaluation of increased confusion and agitation. Per nursing note, pt's SNF had him transported to the ED for evaluation stating that he has been frequently "shutting his eyes and not responding when asked questions" and he has also been yelling out and slapping the the hands of the staff away that attempts to help or evaluate him. Per pt, he has otherwise felt baseline and denies any pain while in the ED.   7:08 PM-Daughter arrived into the ED and stated that pt is functioning at his normal baseline and without increased confusion or agitation.  Past Medical History:  Diagnosis Date  . Abnormality of gait 07/04/2012  . Actinic keratoses   . Arthritis   . Benign prostatic hyperplasia   . BPH (benign prostatic hyperplasia)   . CAD (coronary artery disease) of artery bypass graft 12/02/2013  . CAD (coronary artery disease) of bypass graft    a. s/p CABG 1991 without subsequent interventions.  . Chorioretinitis   . Chronic diastolic CHF (congestive heart failure) ([place])   . Colon cancer ([place])    a. admitted to [facility] 12/07/14 with profound iron deficiency anemia Hgb 4.5, melena, with  new diagnosis of invasive adenocarcinoma of colon s/p lap R colectomy on 12/15/14.  . Dementia   . Diverticulitis   . Dizziness and giddiness 07/04/2012  . Edema   . Essential hypertension   . Hereditary and idiopathic peripheral neuropathy 07/04/2012   a history of chronic sensory polyneuropathy of undetermined origin with abnormality of gait. He is walking with a rolling walker. Last MRA of the head January 2013 shows no significant stenosis, MRA of the neck shows mild stenosis of the proximal left internal carotid artery.    Marland Kitchen History of atrial fibrillation   . HLD (hyperlipidemia)   . HTN (hypertension) 12/01/2013  . Hyperlipidemia   . Hypothyroidism 12/01/2013  . Hypothyroidism   . 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 Internal carotid artery stenosis    left  . Iron deficiency anemia    a. 11/2014 - Hgb 4.5 in setting of newly dx colon CA.   . Memory loss   . Mild left ventricular hypertrophy   . Neuromuscular disorder ([place])   . PAF (paroxysmal atrial fibrillation) ([place])    a. Not on anticoag due to bleeding and fall risk.  . Panuveitis   . Peripheral autonomic neuropathy in disorders classified elsewhere(337.1) 07/04/2012  . Prostatitis, acute    1991  . Retinitis   . Stroke ([place])   . Thyroid disease   . Transient cerebral ischemia 07/04/2012   a posterior circulation TIA in October 2009.    . 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 Insomnia 04/01/2016  . Adult failure to thrive 04/01/2016  . Acute bronchitis 02/22/2016  . Hypernatremia 01/31/2016  . Urinary tract infection 09/29/2015  . Chronic diastolic CHF (congestive heart failure) ([place])   . Chronic kidney disease 12/28/2014  . Leukocytosis   . Colon cancer ([place]) of the transverse colon 12/12/2014  . Anemia, iron deficiency   . Encounter for monitoring antiplatelet therapy   . PAF (paroxysmal atrial fibrillation) ([place]) 12/07/2014  .  Arthritis   . Essential hypertension   . Skin cancer 11/11/2014  . Constipation 10/10/2014  . Blepharitis of eyelid of right eye 02/24/2014  . Allergy 01/24/2014  . CAD (coronary artery disease) of artery bypass graft 12/02/2013  . Benign prostatic hyperplasia   . Edema   . Hypothyroidism 12/01/2013  . Alzheimer's disease 07/04/2012  . Peripheral autonomic neuropathy in disorders classified elsewhere 07/04/2012  . 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 Hereditary and idiopathic peripheral neuropathy 07/04/2012  . Abnormality of gait 07/04/2012  . Transient cerebral ischemia 07/04/2012  . Chorioretinitis 02/22/2011  . History of atrial fibrillation   . 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 Chorioretinal atrophy 12/21/2010   Past Surgical History:  Procedure Laterality Date  . APPENDECTOMY    . CARDIAC CATHETERIZATION    . COLON RESECTION N/A 12/15/2014   Procedure: Laparoscopic partial colectomy;  Surgeon: Autumn Messing III, MD;  Location: [facility] ORS;  Service: General;  Laterality: N/A;  . COLONOSCOPY WITH PROPOFOL N/A 12/12/2014   Procedure: COLONOSCOPY WITH PROPOFOL;  Surgeon: Gatha Mayer, MD;  Location: [facility] ENDOSCOPY;  Service: Endoscopy;  Laterality: N/A;  . CORONARY ARTERY BYPASS GRAFT  1991   grafts to LAD and RCA  . CORONARY ARTERY BYPASS GRAFT  1991    Home Medications    Prior to Admission medications   Medication Sig Start Date End Date Taking? Authorizing Provider  acetaminophen (TYLENOL) 325 MG tablet Take 650 mg by mouth. Take 2 tablets twice daily. Take 2 every 6 hours as needed for mild pain    Historical Provider, MD  amiodarone (PACERONE) 200 MG tablet Take 1 tablet (200 mg total) by mouth daily. 12/21/14   Theodis Blaze, MD  Artificial Tear Ointment (REFRESH LACRI-LUBE) OINT Apply to eye at bedtime.    Historical Provider, MD  carvedilol (COREG) 3.125 MG tablet Take 1 tablet (3.125 mg total) by mouth 2 (two)  times daily with a meal. 12/21/14   Theodis Blaze, MD  furosemide (LASIX) 20 MG tablet Take by mouth. Take 1/2 tablet (10mg ) daily    Historical Provider, MD  levothyroxine (SYNTHROID, LEVOTHROID) 112 MCG tablet Take 112 mcg by mouth daily before breakfast.    Historical Provider, MD  polyethylene glycol (MIRALAX / GLYCOLAX) packet Take 17 g by mouth daily.    Historical Provider, MD  polyvinyl alcohol (LIQUIFILM TEARS) 1.4 % ophthalmic solution Place 1 drop into both eyes 4 (four) times daily.    Historical Provider, MD  potassium chloride (K-DUR) 10 MEQ tablet Take 10 mEq by mouth. Take one tablet daily    Historical Provider, MD  tamsulosin (FLOMAX) 0.4 MG CAPS capsule Take by mouth. Take two capsules (0.8mg ) daily    Historical Provider, MD  traZODone (DESYREL) 50 MG tablet Take 50 mg by mouth. Take 1/2 tablet (25mg ) at bedtime for sleep    Historical Provider, MD   Family History Family History  Problem Relation  Age of Onset  . Pneumonia Mother   . Diabetes Mother   . Cancer Mother     colon  . Hypertension Mother   . Congestive Heart Failure Father   . Hypertension Father   . Coronary artery disease Father   . Heart attack Father   . Diabetes Father   . Heart failure Father   . Stroke Father   . Diabetes Sister   . Cancer Sister     breast  . Coronary artery disease Brother   . Heart disease Sister   . Diabetes Sister   . Kidney failure Sister   . Heart attack Sister    Social History Social History  Substance Use Topics  . Smoking status: Former Smoker    Quit date: 12/03/1978  . Smokeless tobacco: Never Used  . Alcohol use No   Allergies   Sulfa antibiotics and Ativan [lorazepam]  Review of Systems Review of Systems  Unable to perform ROS: Dementia   Physical Exam Updated Vital Signs BP (!) 168/105 (BP Location: Left Arm)   Pulse 71   Resp 20   Ht 6' (1.829 m)   Wt 187 lb (84.8 kg)   SpO2 95%   BMI 25.36 kg/m   Physical Exam  Constitutional: He  appears well-developed and well-nourished.  HENT:  Head: Normocephalic.  Right Ear: External ear normal.  Left Ear: External ear normal.  Nose: Nose normal.  Eyes: Conjunctivae are normal. Right eye exhibits no discharge. Left eye exhibits no discharge.  Neck: Normal range of motion.  Cardiovascular: Normal rate, regular rhythm and normal heart sounds.   No murmur heard. Pulmonary/Chest: Effort normal and breath sounds normal. No respiratory distress. He has no wheezes. He has no rales.  Abdominal: Soft. There is no tenderness. There is no rebound and no guarding.  Musculoskeletal: Normal range of motion. He exhibits no edema or tenderness.  Neurological: He is alert.  Pleasantly confused. No obvious facial droop, moves all extremities. Strength is equal.   Skin: Skin is warm and dry. No rash noted. No erythema. No pallor.  Psychiatric: He has a normal mood and affect. His behavior is normal.  Nursing note and vitals reviewed.  ED Treatments / Results  DIAGNOSTIC STUDIES: Oxygen Saturation is 95% on RA, adequate by my interpretation.   Labs (all labs ordered are listed, but only abnormal results are displayed) Labs Reviewed - No data to display  EKG  EKG Interpretation None      Radiology No results found.  Procedures Procedures   Medications Ordered in ED Medications - No data to display  Initial Impression / Assessment and Plan / ED Course  I have reviewed the triage vital signs and the nursing notes.  Pertinent labs & imaging results that were available during my care of the patient were reviewed by me and considered in my medical decision making (see chart for details).     83 year old male brought in for evaluation for change in his mental status. His daughter subsequent became to bedside. She is pleasant and interesting be done. She reports the patient is at his baseline. I think this is reasonable. Patient is very confused, but this seems consistent with this  past history and report from his daughter knows him well. There is nothing in the history or his exam that makes me overly concerned for an acute process that necessitates emergent workup. Daughter is comfortable with him being discharged back to his facility. Return precautions were discussed.  Final Clinical Impressions(s) / ED Diagnoses   Final diagnoses:  Adult general medical exam   New Prescriptions New Prescriptions   No medications on file   I personally preformed the services scribed in my presence. The recorded information has been reviewed is accurate. Virgel Manifold, MD.     Virgel Manifold, MD 05/20/16 [id number]

## 2016-05-18 NOTE — ED Notes (Signed)
Bed: OZ22 Expected date:  Expected time:  Means of arrival:  Comments: 83 yo evaluation from facility

## 2016-05-18 NOTE — ED Triage Notes (Signed)
Per EMS- patient is a resident of [facility]. Staff reported that the patient had a friend that moved out of his unit and patient has been shutting his eyes frequently and not responding when asked. Staff also reported that the patient will yell out and slap other's hands when attempting to take care of the patient. Patient has a history of Alzheimer's. Staff anted to have the patient evaluated.

## 2016-05-18 NOTE — ED Notes (Signed)
Family member told NT that she wanted to put the patient's clothes on and take him back to the nursing home because she felt like it was his alzheimer's. EDP notified.

## 2016-05-21 DIAGNOSIS — R531 Weakness: Secondary | ICD-10-CM | POA: Diagnosis not present

## 2016-06-03 DIAGNOSIS — R531 Weakness: Secondary | ICD-10-CM | POA: Diagnosis not present

## 2016-06-05 ENCOUNTER — Encounter: Payer: Self-pay | Admitting: Nurse Practitioner

## 2016-06-05 ENCOUNTER — Non-Acute Institutional Stay (SKILLED_NURSING_FACILITY): Payer: Medicare Other | Admitting: Nurse Practitioner

## 2016-06-05 DIAGNOSIS — I10 Essential (primary) hypertension: Secondary | ICD-10-CM | POA: Diagnosis not present

## 2016-06-05 DIAGNOSIS — D500 Iron deficiency anemia secondary to blood loss (chronic): Secondary | ICD-10-CM | POA: Diagnosis not present

## 2016-06-05 DIAGNOSIS — N182 Chronic kidney disease, stage 2 (mild): Secondary | ICD-10-CM

## 2016-06-05 DIAGNOSIS — K5900 Constipation, unspecified: Secondary | ICD-10-CM

## 2016-06-05 DIAGNOSIS — R627 Adult failure to thrive: Secondary | ICD-10-CM

## 2016-06-05 DIAGNOSIS — I480 Paroxysmal atrial fibrillation: Secondary | ICD-10-CM

## 2016-06-05 DIAGNOSIS — F5101 Primary insomnia: Secondary | ICD-10-CM | POA: Diagnosis not present

## 2016-06-05 DIAGNOSIS — M1990 Unspecified osteoarthritis, unspecified site: Secondary | ICD-10-CM | POA: Diagnosis not present

## 2016-06-05 DIAGNOSIS — E039 Hypothyroidism, unspecified: Secondary | ICD-10-CM | POA: Diagnosis not present

## 2016-06-05 DIAGNOSIS — G301 Alzheimer's disease with late onset: Secondary | ICD-10-CM | POA: Diagnosis not present

## 2016-06-05 DIAGNOSIS — F0281 Dementia in other diseases classified elsewhere with behavioral disturbance: Secondary | ICD-10-CM

## 2016-06-05 DIAGNOSIS — N400 Enlarged prostate without lower urinary tract symptoms: Secondary | ICD-10-CM | POA: Diagnosis not present

## 2016-06-05 DIAGNOSIS — I5032 Chronic diastolic (congestive) heart failure: Secondary | ICD-10-CM

## 2016-06-05 DIAGNOSIS — E870 Hyperosmolality and hypernatremia: Secondary | ICD-10-CM

## 2016-06-05 DIAGNOSIS — R609 Edema, unspecified: Secondary | ICD-10-CM

## 2016-06-05 DIAGNOSIS — F02818 Alzheimer's disease with late onset: Secondary | ICD-10-CM

## 2016-06-05 NOTE — Assessment & Plan Note (Signed)
Off Namenda, confusion at his baseline, combative, aggressive, lack of safety awareness, will increase Trazodone to 50mg  daily for better mood control, update CBC CMP UA C/S TSH

## 2016-06-05 NOTE — Assessment & Plan Note (Signed)
Resolved, last Na 141 03/19/16

## 2016-06-05 NOTE — Assessment & Plan Note (Signed)
No urinary retention. cotninueTamsulosin 0.8 mg daily.

## 2016-06-05 NOTE — Assessment & Plan Note (Signed)
Clinically compensated, continue Furosemide 10mg  daily, 03/19/16 BNP 187

## 2016-06-05 NOTE — Assessment & Plan Note (Signed)
Stable, continue Trazodone.  

## 2016-06-05 NOTE — Assessment & Plan Note (Signed)
03/12/16 creat 1.18, continue Furosemide 10mg 

## 2016-06-05 NOTE — Assessment & Plan Note (Signed)
Stable, last Hgb 12.8 03/19/16

## 2016-06-05 NOTE — Assessment & Plan Note (Signed)
Controlled, continue Coreg 3.125mg  bid, Furosemide 10mg  daily.

## 2016-06-05 NOTE — Assessment & Plan Note (Signed)
Multiple sites, w/c for mobility, prn Tylenol 650 bid, prn q6h

## 2016-06-05 NOTE — Assessment & Plan Note (Signed)
Persists.  

## 2016-06-05 NOTE — Assessment & Plan Note (Signed)
Stable since 01/17/16 changed Miralax to prn

## 2016-06-05 NOTE — Assessment & Plan Note (Signed)
Heart rate is in control, continue Amiodarone 200mg  daily.

## 2016-06-05 NOTE — Assessment & Plan Note (Signed)
10/09/15 increase Levothyroxine to 129mcg daily 12/05/15 TSH 3.44 Update TSH

## 2016-06-05 NOTE — Progress Notes (Signed)
Location:  [facility] Room Number: 105 Place of Service:  SNF (31) Provider: Mast, Manxie  NP  Jeanmarie Hubert, MD  Patient Care Team: Estill Dooms, MD as PCP - General (Internal Medicine) Zebedee Iba, MD as Referring Physician (Ophthalmology) Darlin Coco, MD as Consulting Physician (Cardiology) Garvin Fila, MD as Consulting Physician (Neurology) Festus Aloe, MD as Consulting Physician (Urology) Marilynne Halsted, MD as Referring Physician (Ophthalmology) Lavonna Monarch, MD as Consulting Physician (Dermatology) Gerarda Fraction, MD as Referring Physician (Ophthalmology) Man Otho Darner, NP as Nurse Practitioner (Internal Medicine)  Extended Emergency Contact Information Primary Emergency Contact: Farr,Laura Address: [address] [place] [address] Johnnette Litter of [place] Phone: [phone] Mobile Phone: [phone] Relation: Daughter Secondary Emergency Contact: Genia Plants [place] of [place] Phone: [phone] Relation: Daughter  Code Status:  DNR Goals of care: Advanced Directive information Advanced Directives 06/05/2016  Does Patient Have a Medical Advance Directive? Yes  Type of Advance Directive Out of facility DNR (pink MOST or yellow form)  Would patient like information on creating a medical advance directive? No - Patient declined  Pre-existing out of facility DNR order (yellow form or pink MOST form) Yellow form placed in chart (order not valid for inpatient use)     Chief Complaint  Patient presents with  . Acute Visit    Aggressive and combative    HPI:  Pt is a 83 y.o. male seen today for an acute visit for aggressive and combative behaviors, frequent falling due to his lack of safety awareness and restlessness at times. ED eval for AMS, not responding, yelling out, returned to his baseline mentation upon arrival of ED.     Hx of anemia, s/p partial colectomy, last Hgb 12.8 1/30/18CAD  has been stable, no angina since last visit, hypothyroidism taking Levothyroxine 157mcg, last TSH 3.44 12/05/15.HTN is controlled, his memory has been gradual decline but functioning well in SNF, off Namenda, peripheral neuropathy has been stable, he walks with walker for short distance and w/c to go further. Trace edema BLE, on Furosemide 10mg , BNP 187 03/19/16. His mood is stable on Trazodone 50mg  nightly.   Past Medical History:  Diagnosis Date  . Abnormality of gait 07/04/2012  . Actinic keratoses   . Arthritis   . Benign prostatic hyperplasia   . BPH (benign prostatic hyperplasia)   . CAD (coronary artery disease) of artery bypass graft 12/02/2013  . CAD (coronary artery disease) of bypass graft    a. s/p CABG 1991 without subsequent interventions.  . Chorioretinitis   . Chronic diastolic CHF (congestive heart failure) ([place])   . Colon cancer ([place])    a. admitted to [facility] 12/07/14 with profound iron deficiency anemia Hgb 4.5, melena, with new diagnosis of invasive adenocarcinoma of colon s/p lap R colectomy on 12/15/14.  . Dementia   . Diverticulitis   . Dizziness and giddiness 07/04/2012  . Edema   . Essential hypertension   . Hereditary and idiopathic peripheral neuropathy 07/04/2012   a history of chronic sensory polyneuropathy of undetermined origin with abnormality of gait. He is walking with a rolling walker. Last MRA of the head January 2013 shows no significant stenosis, MRA of the neck shows mild stenosis of the proximal left internal carotid artery.    Marland Kitchen History of atrial fibrillation   . HLD (hyperlipidemia)   . HTN (hypertension) 12/01/2013  . Hyperlipidemia   . Hypothyroidism 12/01/2013  .  Hypothyroidism   . 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 Internal carotid artery stenosis    left  . Iron deficiency anemia    a. 11/2014 - Hgb 4.5 in setting of newly dx colon CA.   . Memory loss   . Mild left ventricular hypertrophy   . Neuromuscular disorder ([place])    . PAF (paroxysmal atrial fibrillation) ([place])    a. Not on anticoag due to bleeding and fall risk.  . Panuveitis   . Peripheral autonomic neuropathy in disorders classified elsewhere(337.1) 07/04/2012  . Prostatitis, acute    1991  . Retinitis   . Stroke ([place])   . Thyroid disease   . Transient cerebral ischemia 07/04/2012   a posterior circulation TIA in October 2009.    . 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 APPENDECTOMY    . CARDIAC CATHETERIZATION    . COLON RESECTION N/A 12/15/2014   Procedure: Laparoscopic partial colectomy;  Surgeon: Autumn Messing III, MD;  Location: [facility]S;  Service: General;  Laterality: N/A;  . COLONOSCOPY WITH PROPOFOL N/A 12/12/2014   Procedure: COLONOSCOPY WITH PROPOFOL;  Surgeon: Gatha Mayer, MD;  Location: [facility] END[facility];  Service: Endoscopy;  Laterality: N/A;  . CORONARY ARTERY BYPASS GRAFT  1991   grafts to LAD and RCA  . CORONARY ARTERY BYPASS GRAFT  1991    Allergies  Allergen Reactions  . Sulfa Antibiotics Other (See Comments)    unknown  . Ativan [Lorazepam] Anxiety    Outpatient Encounter Prescriptions as of 06/05/2016  Medication Sig  . acetaminophen (TYLENOL) 325 MG tablet Take 650 mg by mouth. Take 2 tablets twice daily. Take 2 every 6 hours as needed for mild pain  . amiodarone (PACERONE) 200 MG tablet Take 1 tablet (200 mg total) by mouth daily.  . Artificial Tear Ointment (REFRESH LACRI-LUBE) OINT Apply to eye at bedtime.  . carvedilol (COREG) 3.125 MG tablet Take 1 tablet (3.125 mg total) by mouth 2 (two) times daily with a meal.  . furosemide (LASIX) 20 MG tablet Take by mouth. Take 1/2 tablet (10mg ) daily  . levothyroxine (SYNTHROID, LEVOTHROID) 112 MCG tablet Take 112 mcg by mouth daily before breakfast.  . polyethylene glycol (MIRALAX / GLYCOLAX) packet Take 17 g by mouth daily.  . polyvinyl alcohol (LIQUIFILM TEARS) 1.4 % ophthalmic solution Place 1 drop into both eyes 4  (four) times daily.  . potassium chloride (K-DUR) 10 MEQ tablet Take 10 mEq by mouth. Take one tablet daily  . potassium chloride (K-DUR,KLOR-CON) 10 MEQ tablet Take 10 mEq by mouth daily.  . tamsulosin (FLOMAX) 0.4 MG CAPS capsule Take by mouth. Take two capsules (0.8mg ) daily  . traZODone (DESYREL) 50 MG tablet Take 50 mg by mouth. Take 1/2 tablet (25mg ) at bedtime for sleep   No facility-administered encounter medications on file as of 06/05/2016.     Review of Systems  Constitutional: Positive for fatigue. Negative for chills, diaphoresis and fever.  HENT: Positive for hearing loss. Negative for congestion and ear pain.   Eyes: Negative for pain, discharge and redness.  Respiratory: Negative for cough and shortness of breath.   Cardiovascular: Positive for leg swelling. Negative for chest pain and palpitations.  Gastrointestinal: Positive for constipation. Negative for abdominal pain, diarrhea, nausea and vomiting.  Genitourinary: Positive for frequency. Negative for dysuria and urgency.  Musculoskeletal: Positive for arthralgias, back pain and gait problem. Negative for myalgias and neck pain.  Ambulates with walker, w/c to go further. C/o right knee pain with walker, declined pain meds. Frequent falls.   Skin: Negative for rash.       Healed abd laparoscopic surgical scars.  Bleeding keratotic lesions of the superior aspect of the right pinna. Painless.  Neurological: Negative for dizziness, tremors, seizures, weakness and headaches.       Peripheral neuropathy  Psychiatric/Behavioral: Positive for confusion. Negative for decreased concentration, hallucinations and suicidal ideas. The patient is not nervous/anxious.        No sleeping at night, combative when redirected, belligerent     Immunization History  Administered Date(s) Administered  . Influenza-Unspecified 12/22/2013, 11/09/2014, 12/05/2015  . PPD Test 01/03/2015  . Pneumococcal Polysaccharide-23 12/02/1993  .  Tdap 01/08/2013  . Zoster 12/03/2007   Pertinent  Health Maintenance Due  Topic Date Due  . PNA vac Low Risk Adult (2 of 2 - PCV13) 12/03/1994  . INFLUENZA VACCINE  09/18/2016   Fall Risk  02/10/2015 06/27/2014  Falls in the past year? Yes No  Number falls in past yr: 1 -  Injury with Fall? No -  Risk for fall due to : History of fall(s) Impaired balance/gait  Follow up Falls evaluation completed -   Functional Status Survey:    Vitals:   06/05/16 1208  BP: (!) 118/56  Pulse: 70  Resp: 16  Temp: 97.9 F (36.6 C)  Weight: 188 lb 6.4 oz (85.5 kg)  Height: 6' (1.829 m)   Body mass index is 25.55 kg/m. Physical Exam  Constitutional: He appears well-developed and well-nourished. No distress.  HENT:  Head: Normocephalic and atraumatic.  Right Ear: External ear normal.  Left Ear: External ear normal.  Nose: Nose normal.  Mouth/Throat: Oropharynx is clear and moist. No oropharyngeal exudate.  Eyes: Conjunctivae and EOM are normal. Pupils are equal, round, and reactive to light. Right eye exhibits no discharge. Left eye exhibits no discharge. No scleral icterus.  Neck: Normal range of motion. Neck supple. No JVD present. No tracheal deviation present. No thyromegaly present.  Cardiovascular: Normal rate and regular rhythm.  Exam reveals no gallop and no friction rub.   Murmur heard. Systolic murmur 6-0/1 right sternal border   Pulmonary/Chest: Effort normal. No stridor. No respiratory distress. He has no wheezes. He has rales. He exhibits no tenderness.  Abdominal: Soft. Bowel sounds are normal. He exhibits no distension and no mass. There is no tenderness.  Musculoskeletal: Normal range of motion. He exhibits edema and tenderness.  R knee pain with walking, no erythema, swelling, patellar ballottement, or crepitus. Edema BLE R 1+, L trace.   Neurological: He is alert. He has normal reflexes. No cranial nerve deficit. He exhibits normal muscle tone. Coordination normal.  Skin:  Skin is warm and dry. No rash noted. He is not diaphoretic. No erythema. No pallor.  Painless bleeding lesion in the superior aspect of the right and left  Psychiatric: He has a normal mood and affect. Thought content normal. Cognition and memory are impaired. He exhibits abnormal recent memory. He exhibits normal remote memory.  Memory deficits.     Labs reviewed:  Recent Labs  02/23/16 03/12/16 03/19/16  NA 147 142 141  K 3.8 3.7 3.9  BUN 23* 20 19  CREATININE 1.2 1.2 1.2    Recent Labs  10/06/15 01/16/16 03/19/16  AST 19 21 21   ALT 12 13 16   ALKPHOS 83 77 83    Recent Labs  01/16/16 02/23/16 03/19/16  WBC 5.2 8.0  4.4  HGB 12.8* 11.6* 12.8*  HCT 39* 36* 39*  PLT 197 194 213   Lab Results  Component Value Date   TSH 3.44 12/05/2015   Lab Results  Component Value Date   HGBA1C 5.9 (H) 12/15/2014   Lab Results  Component Value Date   CHOL 95 12/26/2015   HDL 42 12/26/2015   LDLCALC 46 12/26/2015   TRIG 37 (A) 12/26/2015   CHOLHDL 2.6 12/07/2014    Significant Diagnostic Results in last 30 days:  No results found.  Assessment/Plan Insomnia Stable, continue Trazodone.   Alzheimer's disease Off Namenda, confusion at his baseline, combative, aggressive, lack of safety awareness, will increase Trazodone to 50mg  daily for better mood control, update CBC CMP UA C/S TSH  Essential hypertension Controlled, continue Coreg 3.125mg  bid, Furosemide 10mg  daily.   PAF (paroxysmal atrial fibrillation) (HCC) Heart rate is in control, continue Amiodarone 200mg  daily.   Chronic diastolic CHF (congestive heart failure) (HCC) Clinically compensated, continue Furosemide 10mg  daily, 03/19/16 BNP 187   Constipation Stable since 01/17/16 changed Miralax to prn   Hypothyroidism 10/09/15 increase Levothyroxine to 122mcg daily 12/05/15 TSH 3.44 Update TSH  Arthritis Multiple sites, w/c for mobility, prn Tylenol 650 bid, prn q6h  Chronic kidney disease 03/12/16 creat  1.18, continue Furosemide 10mg   Benign prostatic hyperplasia No urinary retention. cotninueTamsulosin 0.8 mg daily.   Edema Improved, continue Furosemide 10mg , Kcl 75meq, 03/19/16 BNP 187   Anemia, iron deficiency Stable, last Hgb 12.8 03/19/16  Hypernatremia Resolved, last Na 141 03/19/16  Adult failure to thrive Persists.      Family/ staff Communication: SNF [facility]U  Labs/tests ordered:  UA C/S CBC CMP TSH

## 2016-06-05 NOTE — Assessment & Plan Note (Signed)
Improved, continue Furosemide 10mg , Kcl 73meq, 03/19/16 BNP 187

## 2016-06-06 DIAGNOSIS — N182 Chronic kidney disease, stage 2 (mild): Secondary | ICD-10-CM | POA: Diagnosis not present

## 2016-06-06 DIAGNOSIS — I2510 Atherosclerotic heart disease of native coronary artery without angina pectoris: Secondary | ICD-10-CM | POA: Diagnosis not present

## 2016-06-06 DIAGNOSIS — D509 Iron deficiency anemia, unspecified: Secondary | ICD-10-CM | POA: Diagnosis not present

## 2016-06-06 LAB — CBC AND DIFFERENTIAL
HCT: 37 % — AB (ref 41–53)
HEMOGLOBIN: 13 g/dL — AB (ref 13.5–17.5)
PLATELETS: 204 10*3/uL (ref 150–399)
WBC: 4.8 10^3/mL

## 2016-06-06 LAB — TSH: TSH: 8.59 u[IU]/mL — AB (ref <=5.90)

## 2016-06-10 ENCOUNTER — Other Ambulatory Visit: Payer: Self-pay | Admitting: *Deleted

## 2016-06-21 DIAGNOSIS — R531 Weakness: Secondary | ICD-10-CM | POA: Diagnosis not present

## 2016-06-24 ENCOUNTER — Non-Acute Institutional Stay (SKILLED_NURSING_FACILITY): Payer: Medicare Other | Admitting: Internal Medicine

## 2016-06-24 ENCOUNTER — Encounter: Payer: Self-pay | Admitting: Internal Medicine

## 2016-06-24 DIAGNOSIS — C182 Malignant neoplasm of ascending colon: Secondary | ICD-10-CM

## 2016-06-24 DIAGNOSIS — N182 Chronic kidney disease, stage 2 (mild): Secondary | ICD-10-CM | POA: Diagnosis not present

## 2016-06-24 DIAGNOSIS — I5032 Chronic diastolic (congestive) heart failure: Secondary | ICD-10-CM | POA: Diagnosis not present

## 2016-06-24 DIAGNOSIS — F0281 Dementia in other diseases classified elsewhere with behavioral disturbance: Secondary | ICD-10-CM

## 2016-06-24 DIAGNOSIS — E039 Hypothyroidism, unspecified: Secondary | ICD-10-CM

## 2016-06-24 DIAGNOSIS — I10 Essential (primary) hypertension: Secondary | ICD-10-CM

## 2016-06-24 DIAGNOSIS — G301 Alzheimer's disease with late onset: Secondary | ICD-10-CM | POA: Diagnosis not present

## 2016-06-24 DIAGNOSIS — D500 Iron deficiency anemia secondary to blood loss (chronic): Secondary | ICD-10-CM | POA: Diagnosis not present

## 2016-06-24 NOTE — Progress Notes (Signed)
Progress Note    Location:  [place] Room Number: [place] of Service:  SNF [phone]) Provider:  Estill Dooms, MD  Patient Care Team: Estill Dooms, MD as PCP - General (Internal Medicine) Zebedee Iba., MD as Referring Physician (Ophthalmology) Darlin Coco, MD as Consulting Physician (Cardiology) Garvin Fila, MD as Consulting Physician (Neurology) Festus Aloe, MD as Consulting Physician (Urology) Marilynne Halsted, MD as Referring Physician (Ophthalmology) Lavonna Monarch, MD as Consulting Physician (Dermatology) Gerarda Fraction, MD as Referring Physician (Ophthalmology) Mast, Man X, NP as Nurse Practitioner (Internal Medicine)  Extended Emergency Contact Information Primary Emergency Contact: Farr,Laura Address: [address] [place] [address] Johnnette Litter of [place] Phone: [phone] Mobile Phone: [phone] Relation: Daughter Secondary Emergency Contact: Genia Plants States of [place] Phone: [phone] Relation: Daughter  Code Status:  DNR Goals of care: Advanced Directive information Advanced Directives 06/24/2016  Does Patient Have a Medical Advance Directive? Yes  Type of Advance Directive Out of facility DNR (pink MOST or yellow form)  Would patient like information on creating a medical advance directive? -  Pre-existing out of facility DNR order (yellow form or pink MOST form) Yellow form placed in chart (order not valid for inpatient use);Pink MOST form placed in chart (order not valid for inpatient use)     Chief Complaint  Patient presents with  . Medical Management of Chronic Issues    routine visit    HPI:  Pt is a 83 y.o. male seen today for medical management of chronic diseases.    Late onset Alzheimer's disease with behavioral disturbance - he has adapted to the Memory Unit at [facility]. No change in his dementia since I last examined him.  Iron deficiency anemia due  to chronic blood loss - improved Hgb  Chronic diastolic CHF (congestive heart failure) (HCC) - conmpensated  Stage 2 chronic kidney disease - improved  Malignant neoplasm of ascending colon (HCC) - no evidence for relapse  Essential hypertension - controlled  Hypothyroidism, unspecified type - Elevated TSH recently     Past Medical History:  Diagnosis Date  . Abnormality of gait 07/04/2012  . Actinic keratoses   . Arthritis   . Benign prostatic hyperplasia   . BPH (benign prostatic hyperplasia)   . CAD (coronary artery disease) of artery bypass graft 12/02/2013  . CAD (coronary artery disease) of bypass graft    a. s/p CABG 1991 without subsequent interventions.  . Chorioretinitis   . Chronic diastolic CHF (congestive heart failure) ([place])   . Colon cancer ([place])    a. admitted to [facility] 12/07/14 with profound iron deficiency anemia Hgb 4.5, melena, with new diagnosis of invasive adenocarcinoma of colon s/p lap R colectomy on 12/15/14.  . Dementia   . Diverticulitis   . Dizziness and giddiness 07/04/2012  . Edema   . Essential hypertension   . Hereditary and idiopathic peripheral neuropathy 07/04/2012   a history of chronic sensory polyneuropathy of undetermined origin with abnormality of gait. He is walking with a rolling walker. Last MRA of the head January 2013 shows no significant stenosis, MRA of the neck shows mild stenosis of the proximal left internal carotid artery.    Marland Kitchen History of atrial fibrillation   . HLD (hyperlipidemia)   . HTN (hypertension) 12/01/2013  . Hyperlipidemia   . Hypothyroidism 12/01/2013  . Hypothyroidism   . 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  Internal carotid artery stenosis    left  . Iron deficiency anemia    a. 11/2014 - Hgb 4.5 in setting of newly dx colon CA.   . Memory loss   . Mild left ventricular hypertrophy   . Neuromuscular disorder ([place])   . PAF (paroxysmal atrial fibrillation) ([place])    a. Not on anticoag due to  bleeding and fall risk.  . Panuveitis   . Peripheral autonomic neuropathy in disorders classified elsewhere(337.1) 07/04/2012  . Prostatitis, acute    1991  . Retinitis   . Stroke ([place])   . Thyroid disease   . Transient cerebral ischemia 07/04/2012   a posterior circulation TIA in October 2009.    . 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 APPENDECTOMY    . CARDIAC CATHETERIZATION    . COLON RESECTION N/A 12/15/2014   Procedure: Laparoscopic partial colectomy;  Surgeon: Autumn Messing III, MD;  Location: [facility] ORS;  Service: General;  Laterality: N/A;  . COLONOSCOPY WITH PROPOFOL N/A 12/12/2014   Procedure: COLONOSCOPY WITH PROPOFOL;  Surgeon: Gatha Mayer, MD;  Location: [facility] END[facility];  Service: Endoscopy;  Laterality: N/A;  . CORONARY ARTERY BYPASS GRAFT  1991   grafts to LAD and RCA  . CORONARY ARTERY BYPASS GRAFT  1991    Allergies  Allergen Reactions  . Sulfa Antibiotics Other (See Comments)    unknown  . Ativan [Lorazepam] Anxiety    Outpatient Encounter Prescriptions as of 06/24/2016  Medication Sig  . acetaminophen (TYLENOL) 325 MG tablet Take 650 mg by mouth. Take 2 tablets twice daily. Take 2 every 6 hours as needed for mild pain  . amiodarone (PACERONE) 200 MG tablet Take 1 tablet (200 mg total) by mouth daily.  . Artificial Tear Ointment (REFRESH LACRI-LUBE) OINT Apply to eye at bedtime.  . carvedilol (COREG) 3.125 MG tablet Take 1 tablet (3.125 mg total) by mouth 2 (two) times daily with a meal.  . furosemide (LASIX) 20 MG tablet Take by mouth. Take 1/2 tablet (10mg ) daily  . levothyroxine (SYNTHROID, LEVOTHROID) 112 MCG tablet Take 112 mcg by mouth daily before breakfast.  . polyethylene glycol (MIRALAX / GLYCOLAX) packet Take 17 g by mouth daily.  . polyvinyl alcohol (LIQUIFILM TEARS) 1.4 % ophthalmic solution Place 1 drop into both eyes 4 (four) times daily.  . potassium chloride (K-DUR) 10 MEQ tablet Take 10 mEq  by mouth. Take one tablet daily  . tamsulosin (FLOMAX) 0.4 MG CAPS capsule Take by mouth. Take two capsules (0.8mg ) daily  . traZODone (DESYREL) 50 MG tablet Take 50 mg by mouth. Take 1 tablet  at bedtime for sleep  . [DISCONTINUED] potassium chloride (K-DUR,KLOR-CON) 10 MEQ tablet Take 10 mEq by mouth daily.   No facility-administered encounter medications on file as of 06/24/2016.     Review of Systems  Constitutional: Positive for fatigue. Negative for chills, diaphoresis and fever.  HENT: Positive for hearing loss. Negative for congestion and ear pain.   Eyes: Negative for pain, discharge and redness.  Respiratory: Negative for cough and shortness of breath.   Cardiovascular: Positive for leg swelling. Negative for chest pain and palpitations.  Gastrointestinal: Positive for constipation. Negative for abdominal pain, diarrhea, nausea and vomiting.  Endocrine:       TSH 8.59 on 06/06/16  Genitourinary: Positive for frequency. Negative for dysuria and urgency.  Musculoskeletal: Positive for arthralgias, back pain and gait problem. Negative for myalgias and neck pain.  Ambulates with walker, w/c to go further. C/o right knee pain with walker, declined pain meds. Frequent falls.   Skin: Negative for rash.       Healed abd laparoscopic surgical scars.   Neurological: Negative for dizziness, tremors, seizures, weakness and headaches.       Peripheral neuropathy  Psychiatric/Behavioral: Positive for confusion. Negative for decreased concentration, hallucinations and suicidal ideas. The patient is not nervous/anxious.        No sleeping at night, combative when redirected, belligerent      Immunization History  Administered Date(s) Administered  . Influenza-Unspecified 12/22/2013, 11/09/2014, 12/05/2015  . PPD Test 01/03/2015  . Pneumococcal Polysaccharide-23 12/02/1993  . Tdap 01/08/2013  . Zoster 12/03/2007   Pertinent  Health Maintenance Due  Topic Date Due  . PNA vac Low Risk  Adult (2 of 2 - PCV13) 12/03/1994  . INFLUENZA VACCINE  09/18/2016   Fall Risk  02/10/2015 06/27/2014  Falls in the past year? Yes No  Number falls in past yr: 1 -  Injury with Fall? No -  Risk for fall due to : History of fall(s) Impaired balance/gait  Follow up Falls evaluation completed -     Vitals:   06/24/16 1356  BP: (!) 142/72  Pulse: 74  Resp: 16  Temp: 97 F (36.1 C)  Weight: 185 lb 9.6 oz (84.2 kg)  Height: 6' (1.829 m)   Body mass index is 25.17 kg/m. Physical Exam  Constitutional: He appears well-developed and well-nourished. No distress.  HENT:  Head: Normocephalic and atraumatic.  Right Ear: External ear normal.  Left Ear: External ear normal.  Nose: Nose normal.  Mouth/Throat: Oropharynx is clear and moist. No oropharyngeal exudate.  Eyes: Conjunctivae and EOM are normal. Pupils are equal, round, and reactive to light. Right eye exhibits no discharge. Left eye exhibits no discharge. No scleral icterus.  Neck: Normal range of motion. Neck supple. No JVD present. No tracheal deviation present. No thyromegaly present.  Cardiovascular: Normal rate and regular rhythm.  Exam reveals no gallop and no friction rub.   Murmur heard. Systolic murmur 2-5/3 right sternal border   Pulmonary/Chest: Effort normal. No stridor. No respiratory distress. He has no wheezes. He has rales. He exhibits no tenderness.  Abdominal: Soft. Bowel sounds are normal. He exhibits no distension and no mass. There is no tenderness.  Musculoskeletal: Normal range of motion. He exhibits edema and tenderness.  R knee pain with walking, no erythema, swelling, patellar ballottement, or crepitus. Edema BLE R 1+, L trace.   Neurological: He is alert. He has normal reflexes. No cranial nerve deficit. He exhibits normal muscle tone. Coordination normal.  Skin: Skin is warm and dry. No rash noted. He is not diaphoretic. No erythema. No pallor.  Psychiatric: He has a normal mood and affect. Thought  content normal. Cognition and memory are impaired. He exhibits abnormal recent memory. He exhibits normal remote memory.  Memory deficits.     Labs reviewed:  Recent Labs  02/23/16 03/12/16 03/19/16  NA 147 142 141  K 3.8 3.7 3.9  BUN 23* 20 19  CREATININE 1.2 1.2 1.2    Recent Labs  10/06/15 01/16/16 03/19/16  AST 19 21 21   ALT 12 13 16   ALKPHOS 83 77 83    Recent Labs  02/23/16 03/19/16 06/06/16  WBC 8.0 4.4 4.8  HGB 11.6* 12.8* 13.0*  HCT 36* 39* 37*  PLT 194 213 204   Lab Results  Component Value Date   TSH 8.59 (  A) 06/06/2016   Lab Results  Component Value Date   HGBA1C 5.9 (H) 12/15/2014   Lab Results  Component Value Date   CHOL 95 12/26/2015   HDL 42 12/26/2015   LDLCALC 46 12/26/2015   TRIG 37 (A) 12/26/2015   CHOLHDL 2.6 12/07/2014    Assessment/Plan 1. Late onset Alzheimer's disease with behavioral disturbance The current medical regimen is effective;  continue present plan and medications.  2. Iron deficiency anemia due to chronic blood loss Continue ron  3. Chronic diastolic CHF (congestive heart failure) (HCC) compensated  4. Stage 2 chronic kidney disease improved  5. Malignant neoplasm of ascending colon (HCC) Has not relapsed  6. Essential hypertension controlled  7. Hypothyroidism, unspecified type Continue to monitor

## 2016-07-03 ENCOUNTER — Encounter: Payer: Self-pay | Admitting: Nurse Practitioner

## 2016-07-03 NOTE — Progress Notes (Signed)
This encounter was created in error - please disregard.

## 2016-07-19 DIAGNOSIS — R531 Weakness: Secondary | ICD-10-CM | POA: Diagnosis not present

## 2016-07-24 DIAGNOSIS — R531 Weakness: Secondary | ICD-10-CM | POA: Diagnosis not present

## 2016-07-25 ENCOUNTER — Non-Acute Institutional Stay (SKILLED_NURSING_FACILITY): Payer: Medicare Other | Admitting: Nurse Practitioner

## 2016-07-25 ENCOUNTER — Encounter: Payer: Self-pay | Admitting: Nurse Practitioner

## 2016-07-25 DIAGNOSIS — F5101 Primary insomnia: Secondary | ICD-10-CM | POA: Diagnosis not present

## 2016-07-25 DIAGNOSIS — I5032 Chronic diastolic (congestive) heart failure: Secondary | ICD-10-CM

## 2016-07-25 DIAGNOSIS — D500 Iron deficiency anemia secondary to blood loss (chronic): Secondary | ICD-10-CM | POA: Diagnosis not present

## 2016-07-25 DIAGNOSIS — N182 Chronic kidney disease, stage 2 (mild): Secondary | ICD-10-CM | POA: Diagnosis not present

## 2016-07-25 DIAGNOSIS — K5900 Constipation, unspecified: Secondary | ICD-10-CM

## 2016-07-25 DIAGNOSIS — I10 Essential (primary) hypertension: Secondary | ICD-10-CM | POA: Diagnosis not present

## 2016-07-25 DIAGNOSIS — G301 Alzheimer's disease with late onset: Secondary | ICD-10-CM

## 2016-07-25 DIAGNOSIS — E039 Hypothyroidism, unspecified: Secondary | ICD-10-CM | POA: Diagnosis not present

## 2016-07-25 DIAGNOSIS — M1990 Unspecified osteoarthritis, unspecified site: Secondary | ICD-10-CM

## 2016-07-25 DIAGNOSIS — N400 Enlarged prostate without lower urinary tract symptoms: Secondary | ICD-10-CM

## 2016-07-25 DIAGNOSIS — R609 Edema, unspecified: Secondary | ICD-10-CM | POA: Diagnosis not present

## 2016-07-25 DIAGNOSIS — I480 Paroxysmal atrial fibrillation: Secondary | ICD-10-CM

## 2016-07-25 DIAGNOSIS — F0281 Dementia in other diseases classified elsewhere with behavioral disturbance: Secondary | ICD-10-CM

## 2016-07-25 NOTE — Assessment & Plan Note (Signed)
Improved, continue Furosemide 10mg , Kcl 76meq, 03/19/16 BNP 187

## 2016-07-25 NOTE — Assessment & Plan Note (Signed)
Controlled, continue Coreg 3.125mg  bid, Furosemide 10mg  daily. Update CMP

## 2016-07-25 NOTE — Assessment & Plan Note (Signed)
Heart rate is in control, continue Amiodarone 200mg  daily.

## 2016-07-25 NOTE — Assessment & Plan Note (Signed)
Stable, Hgb 13.0 06/06/16

## 2016-07-25 NOTE — Assessment & Plan Note (Addendum)
Off Namenda, confusion at his baseline, combative, aggressive, lack of safety awareness, resistant to care assistance, dc artificial tears to reduce irritation to the patient, increaseTrazodone to 75/50mg , observe the patient.

## 2016-07-25 NOTE — Assessment & Plan Note (Signed)
No urinary retention. cotninueTamsulosin 0.8 mg daily.

## 2016-07-25 NOTE — Assessment & Plan Note (Signed)
Update CMP.  

## 2016-07-25 NOTE — Assessment & Plan Note (Signed)
Clinically compensated, continue Furosemide 10mg  daily, 03/19/16 BNP 187

## 2016-07-25 NOTE — Assessment & Plan Note (Signed)
Multiple sites, w/c for mobility, prn Tylenol 650 bid, prn q6h

## 2016-07-25 NOTE — Progress Notes (Signed)
Location:  [place] Room Number: 105 Place of Service:  SNF ([phone]) Provider:  Savilla Turbyfill, Manxie  NP  Estill Dooms, MD  Patient Care Team: Estill Dooms, MD as PCP - General (Internal Medicine) Zebedee Iba., MD as Referring Physician (Ophthalmology) Darlin Coco, MD as Consulting Physician (Cardiology) Garvin Fila, MD as Consulting Physician (Neurology) Festus Aloe, MD as Consulting Physician (Urology) Marilynne Halsted, MD as Referring Physician (Ophthalmology) Lavonna Monarch, MD as Consulting Physician (Dermatology) Gerarda Fraction, MD as Referring Physician (Ophthalmology) Quamel Fitzmaurice X, NP as Nurse Practitioner (Internal Medicine)  Extended Emergency Contact Information Primary Emergency Contact: Farr,Laura Address: [address] [place] [address] Johnnette Litter of [place] Phone: [phone] Mobile Phone: [phone] Relation: Daughter Secondary Emergency Contact: Genia Plants States of [place] Phone: [phone] Relation: Daughter  Code Status:  DNR Goals of care: Advanced Directive information Advanced Directives 07/25/2016  Does Patient Have a Medical Advance Directive? Yes  Type of Advance Directive Out of facility DNR (pink MOST or yellow form)  Does patient want to make changes to medical advance directive? No - Patient declined  Would patient like information on creating a medical advance directive? No - Patient declined  Pre-existing out of facility DNR order (yellow form or pink MOST form) Yellow form placed in chart (order not valid for inpatient use)     Chief Complaint  Patient presents with  . Medical Management of Chronic Issues    HPI:  Pt is a 83 y.o. male seen today for medical management of chronic diseases.     Hx of anemia, s/p partial colectomy, last Hgb 13.0 06/06/16,CAD has been stable, no angina since last visit, hypothyroidism taking Levothyroxine 129mcg, last TSH  8.59 06/06/16.HTN is controlled, his memory has been gradual decline but functioning well in SNF, off Namenda, peripheral neuropathy has been stable, he walks with walker for short distance and w/c to go further. Trace edema BLE, on Furosemide 10mg , BNP 187 03/19/16. His mood is manage on Trazodone 50mg  nightly, but he is resistant to care assistance, spit in the staff's face when eye drops administered.     Past Medical History:  Diagnosis Date  . Abnormality of gait 07/04/2012  . Actinic keratoses   . Arthritis   . Benign prostatic hyperplasia   . BPH (benign prostatic hyperplasia)   . CAD (coronary artery disease) of artery bypass graft 12/02/2013  . CAD (coronary artery disease) of bypass graft    a. s/p CABG 1991 without subsequent interventions.  . Chorioretinitis   . Chronic diastolic CHF (congestive heart failure) ([place])   . Colon cancer ([place])    a. admitted to [facility] 12/07/14 with profound iron deficiency anemia Hgb 4.5, melena, with new diagnosis of invasive adenocarcinoma of colon s/p lap R colectomy on 12/15/14.  . Dementia   . Diverticulitis   . Dizziness and giddiness 07/04/2012  . Edema   . Essential hypertension   . Hereditary and idiopathic peripheral neuropathy 07/04/2012   a history of chronic sensory polyneuropathy of undetermined origin with abnormality of gait. He is walking with a rolling walker. Last MRA of the head January 2013 shows no significant stenosis, MRA of the neck shows mild stenosis of the proximal left internal carotid artery.    Marland Kitchen History of atrial fibrillation   . HLD (hyperlipidemia)   . HTN (hypertension) 12/01/2013  . Hyperlipidemia   . Hypothyroidism 12/01/2013  . Hypothyroidism   .  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 Internal carotid artery stenosis    left  . Iron deficiency anemia    a. 11/2014 - Hgb 4.5 in setting of newly dx colon CA.   . Memory loss   . Mild left ventricular hypertrophy   . Neuromuscular disorder ([place])    . PAF (paroxysmal atrial fibrillation) ([place])    a. Not on anticoag due to bleeding and fall risk.  . Panuveitis   . Peripheral autonomic neuropathy in disorders classified elsewhere(337.1) 07/04/2012  . Prostatitis, acute    1991  . Retinitis   . Stroke ([place])   . Thyroid disease   . Transient cerebral ischemia 07/04/2012   a posterior circulation TIA in October 2009.    . 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 APPENDECTOMY    . CARDIAC CATHETERIZATION    . COLON RESECTION N/A 12/15/2014   Procedure: Laparoscopic partial colectomy;  Surgeon: Autumn Messing III, MD;  Location: [facility];  Service: General;  Laterality: N/A;  . COLONOSCOPY WITH PROPOFOL N/A 12/12/2014   Procedure: COLONOSCOPY WITH PROPOFOL;  Surgeon: Gatha Mayer, MD;  [facility];  Service: Endoscopy;  Laterality: N/A;  . CORONARY ARTERY BYPASS GRAFT  1991   grafts to LAD and RCA  . CORONARY ARTERY BYPASS GRAFT  1991    Allergies  Allergen Reactions  . Sulfa Antibiotics Other (See Comments)    unknown  . Ativan [Lorazepam] Anxiety    Outpatient Encounter Prescriptions as of 07/25/2016  Medication Sig  . acetaminophen (TYLENOL) 325 MG tablet Take 650 mg by mouth. Take 2 tablets twice daily. Take 2 every 6 hours as needed for mild pain  . amiodarone (PACERONE) 200 MG tablet Take 1 tablet (200 mg total) by mouth daily.  . Artificial Tear Ointment (REFRESH LACRI-LUBE) OINT Apply to eye at bedtime.  . carvedilol (COREG) 3.125 MG tablet Take 1 tablet (3.125 mg total) by mouth 2 (two) times daily with a meal.  . furosemide (LASIX) 20 MG tablet Take by mouth. Take 1/2 tablet (10mg ) daily  . levothyroxine (SYNTHROID, LEVOTHROID) 112 MCG tablet Take 112 mcg by mouth daily before breakfast.  . polyethylene glycol (MIRALAX / GLYCOLAX) packet Take 17 g by mouth daily.  . polyvinyl alcohol (ARTIFICIAL TEARS) 1.4 % ophthalmic solution Place 1 drop into both eyes 4  (four) times daily.  . potassium chloride (K-DUR) 10 MEQ tablet Take 10 mEq by mouth. Take one tablet daily  . tamsulosin (FLOMAX) 0.4 MG CAPS capsule Take by mouth. Take two capsules (0.8mg ) daily  . traZODone (DESYREL) 50 MG tablet Take 50 mg by mouth. Take 1 tablet  at bedtime for sleep  . [DISCONTINUED] polyvinyl alcohol (LIQUIFILM TEARS) 1.4 % ophthalmic solution Place 1 drop into both eyes 4 (four) times daily.   No facility-administered encounter medications on file as of 07/25/2016.     Review of Systems  Constitutional: Positive for fatigue. Negative for chills, diaphoresis and fever.  HENT: Positive for hearing loss. Negative for congestion and ear pain.   Eyes: Negative for pain, discharge and redness.  Respiratory: Negative for cough and shortness of breath.   Cardiovascular: Positive for leg swelling. Negative for chest pain and palpitations.  Gastrointestinal: Positive for constipation. Negative for abdominal pain, diarrhea, nausea and vomiting.  Endocrine:       TSH 8.59 on 06/06/16  Genitourinary: Positive for frequency. Negative for dysuria and urgency.  Musculoskeletal: Positive for arthralgias,  back pain and gait problem. Negative for myalgias and neck pain.       Ambulates with walker, w/c to go further. C/o right knee pain with walker, declined pain meds. Frequent falls.   Skin: Negative for rash.       Healed abd laparoscopic surgical scars.   Neurological: Negative for dizziness, tremors, seizures, weakness and headaches.       Peripheral neuropathy  Psychiatric/Behavioral: Positive for confusion. Negative for decreased concentration, hallucinations and suicidal ideas. The patient is not nervous/anxious.        No sleeping at night, combative when redirected, belligerent     Immunization History  Administered Date(s) Administered  . Influenza-Unspecified 12/22/2013, 11/09/2014, 12/05/2015  . PPD Test 01/03/2015  . Pneumococcal Polysaccharide-23 12/02/1993  . Tdap  01/08/2013  . Zoster 12/03/2007   Pertinent  Health Maintenance Due  Topic Date Due  . PNA vac Low Risk Adult (2 of 2 - PCV13) 12/03/1994  . INFLUENZA VACCINE  09/18/2016   Fall Risk  02/10/2015 06/27/2014  Falls in the past year? Yes No  Number falls in past yr: 1 -  Injury with Fall? No -  Risk for fall due to : History of fall(s) Impaired balance/gait  Follow up Falls evaluation completed -   Functional Status Survey:    Vitals:   07/25/16 1119  BP: 112/86  Pulse: 78  Resp: 17  Temp: 97.9 F (36.6 C)  Weight: 184 lb 6.4 oz (83.6 kg)  Height: 6' (1.829 m)   Body mass index is 25.01 kg/m. Physical Exam  Constitutional: He appears well-developed and well-nourished. No distress.  HENT:  Head: Normocephalic and atraumatic.  Right Ear: External ear normal.  Left Ear: External ear normal.  Nose: Nose normal.  Mouth/Throat: Oropharynx is clear and moist. No oropharyngeal exudate.  Eyes: Conjunctivae and EOM are normal. Pupils are equal, round, and reactive to light. Right eye exhibits no discharge. Left eye exhibits no discharge. No scleral icterus.  Neck: Normal range of motion. Neck supple. No JVD present. No tracheal deviation present. No thyromegaly present.  Cardiovascular: Normal rate and regular rhythm.  Exam reveals no gallop and no friction rub.   Murmur heard. Systolic murmur 1-0/6 right sternal border   Pulmonary/Chest: Effort normal. No stridor. No respiratory distress. He has no wheezes. He has rales. He exhibits no tenderness.  Abdominal: Soft. Bowel sounds are normal. He exhibits no distension and no mass. There is no tenderness.  Musculoskeletal: Normal range of motion. He exhibits edema and tenderness.  R knee pain with walking, no erythema, swelling, patellar ballottement, or crepitus. Edema BLE R 1+, L trace.   Neurological: He is alert. He has normal reflexes. No cranial nerve deficit. He exhibits normal muscle tone. Coordination normal.  Skin: Skin is  warm and dry. No rash noted. He is not diaphoretic. No erythema. No pallor.  Psychiatric: He has a normal mood and affect. Thought content normal. Cognition and memory are impaired. He exhibits abnormal recent memory. He exhibits normal remote memory.  Memory deficits.     Labs reviewed:  Recent Labs  02/23/16 03/12/16 03/19/16  NA 147 142 141  K 3.8 3.7 3.9  BUN 23* 20 19  CREATININE 1.2 1.2 1.2    Recent Labs  10/06/15 01/16/16 03/19/16  AST 19 21 21   ALT 12 13 16   ALKPHOS 83 77 83    Recent Labs  02/23/16 03/19/16 06/06/16  WBC 8.0 4.4 4.8  HGB 11.6* 12.8* 13.0*  HCT 36*  39* 37*  PLT 194 213 204   Lab Results  Component Value Date   TSH 8.59 (A) 06/06/2016   Lab Results  Component Value Date   HGBA1C 5.9 (H) 12/15/2014   Lab Results  Component Value Date   CHOL 95 12/26/2015   HDL 42 12/26/2015   LDLCALC 46 12/26/2015   TRIG 37 (A) 12/26/2015   CHOLHDL 2.6 12/07/2014    Significant Diagnostic Results in last 30 days:  No results found.  Assessment/Plan Hypothyroidism Increased Levothyroxine 175mcg po daily, last TSH 8.59 06/06/16, update TSH 6 weeks.   Essential hypertension Controlled, continue Coreg 3.125mg  bid, Furosemide 10mg  daily. Update CMP  PAF (paroxysmal atrial fibrillation) (HCC) Heart rate is in control, continue Amiodarone 200mg  daily.   Chronic diastolic CHF (congestive heart failure) (HCC) Clinically compensated, continue Furosemide 10mg  daily, 03/19/16 BNP 187   Constipation Stable since 01/17/16 changed Miralax to prn  Alzheimer's disease Off Namenda, confusion at his baseline, combative, aggressive, lack of safety awareness, resistant to care assistance, dc artificial tears to reduce irritation to the patient, increaseTrazodone to 75/50mg , observe the patient.   Chronic kidney disease Update CMP  Benign prostatic hyperplasia No urinary retention. cotninueTamsulosin 0.8 mg daily.   Edema Improved, continue Furosemide 10mg ,  Kcl 31meq, 03/19/16 BNP 187  Anemia, iron deficiency Stable, Hgb 13.0 06/06/16  Insomnia Stable, continue Trazodone  Arthritis Multiple sites, w/c for mobility, prn Tylenol 650 bid, prn q6h     Family/ staff Communication: SNF MCU  Labs/tests ordered: TSH 6 weeks. Update CMP

## 2016-07-25 NOTE — Assessment & Plan Note (Signed)
Stable since 01/17/16 changed Miralax to prn

## 2016-07-25 NOTE — Assessment & Plan Note (Signed)
Increased Levothyroxine 167mcg po daily, last TSH 8.59 06/06/16, update TSH 6 weeks.

## 2016-07-25 NOTE — Assessment & Plan Note (Addendum)
Stable, continue Trazodone.  

## 2016-07-30 DIAGNOSIS — N182 Chronic kidney disease, stage 2 (mild): Secondary | ICD-10-CM | POA: Diagnosis not present

## 2016-07-30 DIAGNOSIS — I5032 Chronic diastolic (congestive) heart failure: Secondary | ICD-10-CM | POA: Diagnosis not present

## 2016-07-30 LAB — HEPATIC FUNCTION PANEL
ALT: 12 (ref 10–40)
AST: 13 — AB (ref 14–40)
Alkaline Phosphatase: 57 (ref 25–125)
Bilirubin, Total: 1.2

## 2016-07-30 LAB — BASIC METABOLIC PANEL
BUN: 25 — AB (ref 4–21)
CREATININE: 1.2 (ref 0.6–1.3)
GLUCOSE: 82
Potassium: 4 (ref 3.4–5.3)
Sodium: 140 (ref 137–147)

## 2016-08-08 DIAGNOSIS — G459 Transient cerebral ischemic attack, unspecified: Secondary | ICD-10-CM | POA: Diagnosis not present

## 2016-08-08 DIAGNOSIS — E039 Hypothyroidism, unspecified: Secondary | ICD-10-CM | POA: Diagnosis not present

## 2016-08-08 DIAGNOSIS — R609 Edema, unspecified: Secondary | ICD-10-CM | POA: Diagnosis not present

## 2016-08-08 DIAGNOSIS — D649 Anemia, unspecified: Secondary | ICD-10-CM | POA: Diagnosis not present

## 2016-08-08 DIAGNOSIS — I10 Essential (primary) hypertension: Secondary | ICD-10-CM | POA: Diagnosis not present

## 2016-08-08 DIAGNOSIS — M25569 Pain in unspecified knee: Secondary | ICD-10-CM | POA: Diagnosis not present

## 2016-08-08 DIAGNOSIS — H3093 Unspecified chorioretinal inflammation, bilateral: Secondary | ICD-10-CM | POA: Diagnosis not present

## 2016-08-08 DIAGNOSIS — R2681 Unsteadiness on feet: Secondary | ICD-10-CM | POA: Diagnosis not present

## 2016-08-08 DIAGNOSIS — I4891 Unspecified atrial fibrillation: Secondary | ICD-10-CM | POA: Diagnosis not present

## 2016-08-08 DIAGNOSIS — L570 Actinic keratosis: Secondary | ICD-10-CM | POA: Diagnosis not present

## 2016-08-08 DIAGNOSIS — G9009 Other idiopathic peripheral autonomic neuropathy: Secondary | ICD-10-CM | POA: Diagnosis not present

## 2016-08-19 ENCOUNTER — Encounter: Payer: Self-pay | Admitting: Nurse Practitioner

## 2016-08-19 ENCOUNTER — Non-Acute Institutional Stay (SKILLED_NURSING_FACILITY): Payer: Medicare Other | Admitting: Nurse Practitioner

## 2016-08-19 DIAGNOSIS — S91311A Laceration without foreign body, right foot, initial encounter: Secondary | ICD-10-CM | POA: Diagnosis not present

## 2016-08-19 DIAGNOSIS — F0281 Dementia in other diseases classified elsewhere with behavioral disturbance: Secondary | ICD-10-CM

## 2016-08-19 DIAGNOSIS — G609 Hereditary and idiopathic neuropathy, unspecified: Secondary | ICD-10-CM | POA: Diagnosis not present

## 2016-08-19 DIAGNOSIS — R531 Weakness: Secondary | ICD-10-CM | POA: Diagnosis not present

## 2016-08-19 DIAGNOSIS — G301 Alzheimer's disease with late onset: Secondary | ICD-10-CM

## 2016-08-19 DIAGNOSIS — F02818 Dementia in other diseases classified elsewhere, unspecified severity, with other behavioral disturbance: Secondary | ICD-10-CM

## 2016-08-19 NOTE — Progress Notes (Signed)
Location:  [facility] Room Number: 105 Place of Service:  SNF ([phone]) Provider:  Johara Lodwick, [place]  NP  Estill Dooms, MD  Patient Care Team: Estill Dooms, MD as PCP - General (Internal Medicine) Zebedee Iba., MD as Referring Physician (Ophthalmology) Darlin Coco, MD as Consulting Physician (Cardiology) Garvin Fila, MD as Consulting Physician (Neurology) Festus Aloe, MD as Consulting Physician (Urology) Marilynne Halsted, MD as Referring Physician (Ophthalmology) Lavonna Monarch, MD as Consulting Physician (Dermatology) Gerarda Fraction, MD as Referring Physician (Ophthalmology) Joneric Streight X, NP as Nurse Practitioner (Internal Medicine)  Extended Emergency Contact Information Primary Emergency Contact: Farr,Laura Address: [address] [place] [address] Johnnette Litter of [place] Phone: [phone] Mobile Phone: [phone] Relation: Daughter Secondary Emergency Contact: Genia Plants States of [place] Phone: [phone] Relation: Daughter  Code Status:  DNR Goals of care: Advanced Directive information Advanced Directives 08/19/2016  Does Patient Have a Medical Advance Directive? Yes  Type of Advance Directive Out of facility DNR (pink MOST or yellow form)  Does patient want to make changes to medical advance directive? No - Patient declined  Would patient like information on creating a medical advance directive? No - Patient declined  Pre-existing out of facility DNR order (yellow form or pink MOST form) Yellow form placed in chart (order not valid for inpatient use)     Chief Complaint  Patient presents with  . Acute Visit    skin tear on top of (Rt) foot    HPI:  Pt is a 83 y.o. male seen today for an acute visit for Skin tear on the top of the right foot when his socks were pulled off,  approximated with steri-strips, no s/s of infection.     His memory has been gradual declined, resides in  [facility], [address], his mood/behaviors are stabilized on Trazodone, his  peripheral neuropathy has been stable,  Past Medical History:  Diagnosis Date  . Abnormality of gait 07/04/2012  . Actinic keratoses   . Arthritis   . Benign prostatic hyperplasia   . BPH (benign prostatic hyperplasia)   . CAD (coronary artery disease) of artery bypass graft 12/02/2013  . CAD (coronary artery disease) of bypass graft    a. s/p CABG 1991 without subsequent interventions.  . Chorioretinitis   . Chronic diastolic CHF (congestive heart failure) ([place])   . Colon cancer ([place])    a. admitted to [facility] 12/07/14 with profound iron deficiency anemia Hgb 4.5, melena, with new diagnosis of invasive adenocarcinoma of colon s/p lap R colectomy on 12/15/14.  . Dementia   . Diverticulitis   . Dizziness and giddiness 07/04/2012  . Edema   . Essential hypertension   . Hereditary and idiopathic peripheral neuropathy 07/04/2012   a history of chronic sensory polyneuropathy of undetermined origin with abnormality of gait. He is walking with a rolling walker. Last MRA of the head January 2013 shows no significant stenosis, MRA of the neck shows mild stenosis of the proximal left internal carotid artery.    Marland Kitchen History of atrial fibrillation   . HLD (hyperlipidemia)   . HTN (hypertension) 12/01/2013  . Hyperlipidemia   . Hypothyroidism 12/01/2013  . Hypothyroidism   . 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 Internal carotid artery stenosis    left  . Iron deficiency anemia    a. 11/2014 - Hgb 4.5 in setting of newly dx colon CA.   Marland Kitchen  Memory loss   . Mild left ventricular hypertrophy   . Neuromuscular disorder ([place])   . PAF (paroxysmal atrial fibrillation) ([place])    a. Not on anticoag due to bleeding and fall risk.  . Panuveitis   . Peripheral autonomic neuropathy in disorders classified elsewhere(337.1) 07/04/2012  . Prostatitis, acute    1991  . Retinitis   . Stroke ([place])   . Thyroid disease     . Transient cerebral ischemia 07/04/2012   a posterior circulation TIA in October 2009.    . 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 APPENDECTOMY    . CARDIAC CATHETERIZATION    . COLON RESECTION N/A 12/15/2014   Procedure: Laparoscopic partial colectomy;  Surgeon: Autumn Messing III, MD;  [facility]: [facility];  Service: General;  Laterality: N/A;  . COLONOSCOPY WITH PROPOFOL N/A 12/12/2014   Procedure: COLONOSCOPY WITH PROPOFOL;  Surgeon: Gatha Mayer, MD;  [facility]OS[facility];  Service: Endoscopy;  Laterality: N/A;  . CORONARY ARTERY BYPASS GRAFT  1991   grafts to LAD and RCA  . CORONARY ARTERY BYPASS GRAFT  1991    Allergies  Allergen Reactions  . Sulfa Antibiotics Other (See Comments)    unknown  . Ativan [Lorazepam] Anxiety    Outpatient Encounter Prescriptions as of 08/19/2016  Medication Sig  . acetaminophen (TYLENOL) 325 MG tablet Take 650 mg by mouth. Take 2 tablets twice daily. Take 2 every 6 hours as needed for mild pain  . amiodarone (PACERONE) 200 MG tablet Take 1 tablet (200 mg total) by mouth daily.  . Artificial Tear Ointment (REFRESH LACRI-LUBE) OINT Apply to eye at bedtime.  . carvedilol (COREG) 3.125 MG tablet Take 1 tablet (3.125 mg total) by mouth 2 (two) times daily with a meal.  . furosemide (LASIX) 20 MG tablet Take by mouth. Take 1/2 tablet (10mg ) daily  . levothyroxine (SYNTHROID, LEVOTHROID) 112 MCG tablet Take 112 mcg by mouth daily before breakfast.  . polyethylene glycol (MIRALAX / GLYCOLAX) packet Take 17 g by mouth daily.  . potassium chloride (K-DUR) 10 MEQ tablet Take 10 mEq by mouth. Take one tablet daily  . tamsulosin (FLOMAX) 0.4 MG CAPS capsule Take by mouth. Take two capsules (0.8mg ) daily  . traZODone (DESYREL) 50 MG tablet Take 50 mg by mouth. Take 1 tablet  at bedtime for sleep  . [DISCONTINUED] polyvinyl alcohol (ARTIFICIAL TEARS) 1.4 % ophthalmic solution Place 1 drop into both eyes 4  (four) times daily.   No facility-administered encounter medications on file as of 08/19/2016.     Review of Systems  Constitutional: Negative for chills, diaphoresis, fatigue and fever.  HENT: Positive for hearing loss. Negative for congestion and ear pain.   Respiratory: Negative for cough and shortness of breath.   Cardiovascular: Positive for leg swelling. Negative for chest pain and palpitations.  Gastrointestinal: Positive for constipation. Negative for abdominal pain, diarrhea, nausea and vomiting.  Endocrine:       TSH 8.59 on 06/06/16  Genitourinary: Positive for frequency. Negative for dysuria and urgency.  Musculoskeletal: Positive for arthralgias, back pain and gait problem. Negative for myalgias and neck pain.       Ambulates with walker, w/c to go further. C/o right knee pain with walker, declined pain meds. Frequent falls.   Skin: Negative for rash.       Healed abd laparoscopic surgical scars. Skin laceration on top of the right foot.   Neurological:  Peripheral neuropathy  Psychiatric/Behavioral: Positive for confusion. Negative for decreased concentration, hallucinations and suicidal ideas. The patient is not nervous/anxious.        Occasionally combative/belligerent   when redirected    Immunization History  Administered Date(s) Administered  . Influenza-Unspecified 12/22/2013, 11/09/2014, 12/05/2015  . PPD Test 01/03/2015  . Pneumococcal Polysaccharide-23 12/02/1993  . Tdap 01/08/2013  . Zoster 12/03/2007   Pertinent  Health Maintenance Due  Topic Date Due  . PNA vac Low Risk Adult (2 of 2 - PCV13) 12/03/1994  . INFLUENZA VACCINE  09/18/2016   Fall Risk  02/10/2015 06/27/2014  Falls in the past year? Yes No  Number falls in past yr: 1 -  Injury with Fall? No -  Risk for fall due to : History of fall(s) Impaired balance/gait  Follow up Falls evaluation completed -   Functional Status Survey:    Vitals:   08/19/16 1206  BP: 110/78  Pulse: 74  Resp:  (!) 22  Temp: 98.4 F (36.9 C)  SpO2: 98%  Weight: 183 lb 9.6 oz (83.3 kg)  Height: 6' (1.829 m)   Body mass index is 24.9 kg/m. Physical Exam  Constitutional: He appears well-developed and well-nourished. No distress.  HENT:  Head: Normocephalic and atraumatic.  Right Ear: External ear normal.  Left Ear: External ear normal.  Nose: Nose normal.  Mouth/Throat: Oropharynx is clear and moist. No oropharyngeal exudate.  Eyes: Conjunctivae and EOM are normal. Pupils are equal, round, and reactive to light. Right eye exhibits no discharge. Left eye exhibits no discharge. No scleral icterus.  Neck: Normal range of motion. Neck supple. No JVD present. No tracheal deviation present. No thyromegaly present.  Cardiovascular: Normal rate and regular rhythm.  Exam reveals no gallop and no friction rub.   Murmur heard. Systolic murmur 0-9/3 right sternal border   Pulmonary/Chest: Effort normal. No stridor. No respiratory distress. He has no wheezes. He has rales. He exhibits no tenderness.  Abdominal: Soft. Bowel sounds are normal. He exhibits no distension and no mass. There is no tenderness.  Musculoskeletal: Normal range of motion. He exhibits edema and tenderness.  R knee pain with walking, no erythema, swelling, patellar ballottement, or crepitus. Edema BLE R 1+, L trace.   Neurological: He is alert. He has normal reflexes. No cranial nerve deficit. He exhibits normal muscle tone. Coordination normal.  Skin: Skin is warm and dry. No rash noted. He is not diaphoretic. No erythema. No pallor.  Skin tear on the top of the right foot, approximated with steri-strips, no s/s of infection.   Psychiatric: He has a normal mood and affect. Thought content normal. Cognition and memory are impaired. He exhibits abnormal recent memory. He exhibits normal remote memory.  Memory deficits.     Labs reviewed:  Recent Labs  02/23/16 03/12/16 03/19/16  NA 147 142 141  K 3.8 3.7 3.9  BUN 23* 20 19    CREATININE 1.2 1.2 1.2    Recent Labs  10/06/15 01/16/16 03/19/16  AST 19 21 21   ALT 12 13 16   ALKPHOS 83 77 83    Recent Labs  02/23/16 03/19/16 06/06/16  WBC 8.0 4.4 4.8  HGB 11.6* 12.8* 13.0*  HCT 36* 39* 37*  PLT 194 213 204   Lab Results  Component Value Date   TSH 8.59 (A) 06/06/2016   Lab Results  Component Value Date   HGBA1C 5.9 (H) 12/15/2014   Lab Results  Component Value Date   CHOL 95 12/26/2015  HDL 42 12/26/2015   LDLCALC 46 12/26/2015   TRIG 37 (A) 12/26/2015   CHOLHDL 2.6 12/07/2014    Significant Diagnostic Results in last 30 days:  No results found.  Assessment/Plan: Laceration of skin of foot, right, initial encounter Skin tear on the top of the right foot, approximated with steri-strips, no s/s of infection. It should heal.    Alzheimer's disease [place] [address], confusion at his baseline, combative, aggressive, lack of safety awareness, resistant to care assistance,  Seems better withTrazodone to 75mg  qd, observe the patient.   Hereditary and idiopathic peripheral neuropathy a history of chronic sensory polyneuropathy of undetermined origin with abnormality of gait. Continue supervision for safety    Family/ staff Communication: SNF  Labs/tests ordered:  none

## 2016-08-19 NOTE — Assessment & Plan Note (Signed)
Skin tear on the top of the right foot, approximated with steri-strips, no s/s of infection. It should heal.

## 2016-08-19 NOTE — Assessment & Plan Note (Signed)
a history of chronic sensory polyneuropathy of undetermined origin with abnormality of gait. Continue supervision for safety

## 2016-08-19 NOTE — Assessment & Plan Note (Signed)
[place] [address], confusion at his baseline, combative, aggressive, lack of safety awareness, resistant to care assistance,  Seems better withTrazodone to 75mg  qd, observe the patient.

## 2016-08-20 DIAGNOSIS — Q6689 Other  specified congenital deformities of feet: Secondary | ICD-10-CM | POA: Diagnosis not present

## 2016-08-20 DIAGNOSIS — M79671 Pain in right foot: Secondary | ICD-10-CM | POA: Diagnosis not present

## 2016-08-20 DIAGNOSIS — L84 Corns and callosities: Secondary | ICD-10-CM | POA: Diagnosis not present

## 2016-08-20 DIAGNOSIS — L602 Onychogryphosis: Secondary | ICD-10-CM | POA: Diagnosis not present

## 2016-08-20 DIAGNOSIS — M79672 Pain in left foot: Secondary | ICD-10-CM | POA: Diagnosis not present

## 2016-08-29 ENCOUNTER — Encounter: Payer: Self-pay | Admitting: Internal Medicine

## 2016-08-29 ENCOUNTER — Non-Acute Institutional Stay (SKILLED_NURSING_FACILITY): Payer: Medicare Other | Admitting: Internal Medicine

## 2016-08-29 DIAGNOSIS — G4700 Insomnia, unspecified: Secondary | ICD-10-CM | POA: Diagnosis not present

## 2016-08-29 DIAGNOSIS — I480 Paroxysmal atrial fibrillation: Secondary | ICD-10-CM | POA: Diagnosis not present

## 2016-08-29 DIAGNOSIS — N183 Chronic kidney disease, stage 3 unspecified: Secondary | ICD-10-CM

## 2016-08-29 DIAGNOSIS — I5032 Chronic diastolic (congestive) heart failure: Secondary | ICD-10-CM | POA: Diagnosis not present

## 2016-08-29 DIAGNOSIS — N400 Enlarged prostate without lower urinary tract symptoms: Secondary | ICD-10-CM

## 2016-08-29 DIAGNOSIS — E039 Hypothyroidism, unspecified: Secondary | ICD-10-CM

## 2016-08-29 NOTE — Progress Notes (Signed)
Location:  [facility] Room Number: 105 Place of Service:  SNF [phone]) Provider:  Blanchie Serve MD  Blanchie Serve, MD  Patient Care Team: Blanchie Serve, MD as PCP - General (Internal Medicine) Zebedee Iba., MD as Referring Physician (Ophthalmology) Darlin Coco, MD as Consulting Physician (Cardiology) Garvin Fila, MD as Consulting Physician (Neurology) Festus Aloe, MD as Consulting Physician (Urology) Marilynne Halsted, MD as Referring Physician (Ophthalmology) Lavonna Monarch, MD as Consulting Physician (Dermatology) Gerarda Fraction, MD as Referring Physician (Ophthalmology) Mast, Man X, NP as Nurse Practitioner (Internal Medicine)  Extended Emergency Contact Information Primary Emergency Contact: Farr,Laura Address: [address] [place] [address] Johnnette Litter of [place] Phone: [phone] Mobile Phone: [phone] Relation: Daughter Secondary Emergency Contact: Genia Plants States of [place] Phone: [phone] Relation: Daughter  Code Status:  DNR Goals of care: Advanced Directive information Advanced Directives 08/19/2016  Does Patient Have a Medical Advance Directive? Yes  Type of Advance Directive Out of facility DNR (pink MOST or yellow form)  Does patient want to make changes to medical advance directive? No - Patient declined  Would patient like information on creating a medical advance directive? No - Patient declined  Pre-existing out of facility DNR order (yellow form or pink MOST form) Yellow form placed in chart (order not valid for inpatient use)     Chief Complaint  Patient presents with  . Medical Management of Chronic Issues    Routine Visit     HPI:  Pt is a 83 y.o. male seen today for medical management of chronic diseases.  He has advanced dementia. He is seen in his room today. He gets around on his wheelchair. He denies any concern this visit. No new concern from nursing.      Past Medical History:  Diagnosis Date  . Abnormality of gait 07/04/2012  . Actinic keratoses   . Arthritis   . Benign prostatic hyperplasia   . BPH (benign prostatic hyperplasia)   . CAD (coronary artery disease) of artery bypass graft 12/02/2013  . CAD (coronary artery disease) of bypass graft    a. s/p CABG 1991 without subsequent interventions.  . Chorioretinitis   . Chronic diastolic CHF (congestive heart failure) ([place])   . Colon cancer ([place])    a. admitted to [facility] 12/07/14 with profound iron deficiency anemia Hgb 4.5, melena, with new diagnosis of invasive adenocarcinoma of colon s/p lap R colectomy on 12/15/14.  . Dementia   . Diverticulitis   . Dizziness and giddiness 07/04/2012  . Edema   . Essential hypertension   . Hereditary and idiopathic peripheral neuropathy 07/04/2012   a history of chronic sensory polyneuropathy of undetermined origin with abnormality of gait. He is walking with a rolling walker. Last MRA of the head January 2013 shows no significant stenosis, MRA of the neck shows mild stenosis of the proximal left internal carotid artery.    Marland Kitchen History of atrial fibrillation   . HLD (hyperlipidemia)   . HTN (hypertension) 12/01/2013  . Hyperlipidemia   . Hypothyroidism 12/01/2013  . Hypothyroidism   . 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 Internal carotid artery stenosis    left  . Iron deficiency anemia    a. 11/2014 - Hgb 4.5 in setting of newly dx colon CA.   . Memory loss   . Mild left ventricular hypertrophy   . Neuromuscular disorder ([place])   . PAF (paroxysmal atrial  fibrillation) ([place])    a. Not on anticoag due to bleeding and fall risk.  . Panuveitis   . Peripheral autonomic neuropathy in disorders classified elsewhere(337.1) 07/04/2012  . Prostatitis, acute    1991  . Retinitis   . Stroke ([place])   . Thyroid disease   . Transient cerebral ischemia 07/04/2012   a posterior circulation TIA in October 2009.    . Vertebrobasilar artery  syndrome 07/04/2012   Past Surgical History:  Procedure Laterality Date  . APPENDECTOMY    . CARDIAC CATHETERIZATION    . COLON RESECTION N/A 12/15/2014   Procedure: Laparoscopic partial colectomy;  Surgeon: Autumn Messing III, MD;  [facility];  Service: General;  Laterality: N/A;  . COLONOSCOPY WITH PROPOFOL N/A 12/12/2014   Procedure: COLONOSCOPY WITH PROPOFOL;  Surgeon: Gatha Mayer, MD;  [facility];  Service: Endoscopy;  Laterality: N/A;  . CORONARY ARTERY BYPASS GRAFT  1991   grafts to LAD and RCA  . CORONARY ARTERY BYPASS GRAFT  1991    Allergies  Allergen Reactions  . Sulfa Antibiotics Other (See Comments)    unknown  . Ativan [Lorazepam] Anxiety    Outpatient Encounter Prescriptions as of 08/29/2016  Medication Sig  . acetaminophen (TYLENOL) 325 MG tablet Take 650 mg by mouth 2 (two) times daily. Take 2 every 6 hours as needed for mild pain  . amiodarone (PACERONE) 200 MG tablet Take 1 tablet (200 mg total) by mouth daily.  . Artificial Tear Ointment (REFRESH LACRI-LUBE) OINT Apply to eye at bedtime.  . carvedilol (COREG) 3.125 MG tablet Take 1 tablet (3.125 mg total) by mouth 2 (two) times daily with a meal.  . furosemide (LASIX) 20 MG tablet Take 10 mg by mouth daily.   Marland Kitchen levothyroxine (SYNTHROID, LEVOTHROID) 150 MCG tablet Take 150 mcg by mouth daily before breakfast.  . polyethylene glycol (MIRALAX / GLYCOLAX) packet Take 17 g by mouth daily as needed.   . potassium chloride (K-DUR) 10 MEQ tablet Take 10 mEq by mouth daily.   . tamsulosin (FLOMAX) 0.4 MG CAPS capsule Take 0.8 mg by mouth daily.   . traZODone (DESYREL) 150 MG tablet Take 75 mg by mouth at bedtime.  . [DISCONTINUED] levothyroxine (SYNTHROID, LEVOTHROID) 112 MCG tablet Take 112 mcg by mouth daily before breakfast.  . [DISCONTINUED] traZODone (DESYREL) 50 MG tablet Take 75 mg by mouth. Take 1 tablet  at bedtime for sleep   No facility-administered encounter medications on file as of  08/29/2016.     Review of Systems  Constitutional: Negative for appetite change, chills, fatigue and fever.  HENT: Positive for hearing loss and trouble swallowing. Negative for congestion and mouth sores.        On thickened liquid  Eyes:       Wears glasses  Respiratory: Negative for cough and shortness of breath.   Cardiovascular: Negative for chest pain and palpitations.  Gastrointestinal: Negative for abdominal pain, constipation, diarrhea, nausea and vomiting.  Genitourinary: Negative for dysuria.       Has urinary incontinence  Musculoskeletal: Positive for arthralgias and gait problem.       On wheelchair, no falls reported.   Skin: Negative for rash and wound.       Easy bruising  Neurological: Negative for dizziness, syncope, light-headedness and headaches.  Hematological: Bruises/bleeds easily.  Psychiatric/Behavioral: Positive for confusion.    Immunization History  Administered Date(s) Administered  . Influenza-Unspecified 12/22/2013, 11/09/2014, 12/05/2015  . PPD Test 01/03/2015  . Pneumococcal Polysaccharide-23  12/02/1993  . Tdap 01/08/2013  . Zoster 12/03/2007   Pertinent  Health Maintenance Due  Topic Date Due  . PNA vac Low Risk Adult (2 of 2 - PCV13) 02/18/2017 (Originally 12/03/1994)  . INFLUENZA VACCINE  09/18/2016   Fall Risk  02/10/2015 06/27/2014  Falls in the past year? Yes No  Number falls in past yr: 1 -  Injury with Fall? No -  Risk for fall due to : History of fall(s) Impaired balance/gait  Follow up Falls evaluation completed -   Functional Status Survey:    Vitals:   08/29/16 1349  BP: 122/64  Pulse: 62  Resp: 20  Temp: 97.6 F (36.4 C)  TempSrc: Oral  SpO2: 98%  Weight: 181 lb (82.1 kg)  Height: 6' (1.829 m)   Body mass index is 24.55 kg/m. Physical Exam  Constitutional: He appears well-developed and well-nourished. No distress.  HENT:  Head: Normocephalic and atraumatic.  Mouth/Throat: Oropharynx is clear and moist.  Eyes:  Pupils are equal, round, and reactive to light. Conjunctivae are normal.  Glasses   Neck: Normal range of motion. Neck supple. No thyromegaly present.  Cardiovascular: Normal rate and regular rhythm.   Pulmonary/Chest: Effort normal and breath sounds normal. No respiratory distress. He has no wheezes. He has no rales.  Abdominal: Soft. Bowel sounds are normal. He exhibits no distension. There is no tenderness. There is no rebound.  Musculoskeletal: Normal range of motion. He exhibits edema.  Trace leg edema, wheelchair bound and has to be wheeled  Lymphadenopathy:    He has no cervical adenopathy.  Neurological: He is alert.  Oriented only to self  Skin: Skin is warm and dry. No rash noted. He is not diaphoretic.  Bruising noted  Psychiatric: He has a normal mood and affect.    Labs reviewed:  Recent Labs  03/12/16 03/19/16 07/30/16  NA 142 141 140  K 3.7 3.9 4.0  BUN 20 19 25*  CREATININE 1.2 1.2 1.2    Recent Labs  01/16/16 03/19/16 07/30/16  AST 21 21 13*  ALT 13 16 12   ALKPHOS 77 83 57    Recent Labs  02/23/16 03/19/16 06/06/16  WBC 8.0 4.4 4.8  HGB 11.6* 12.8* 13.0*  HCT 36* 39* 37*  PLT 194 213 204   Lab Results  Component Value Date   TSH 8.59 (A) 06/06/2016   Lab Results  Component Value Date   HGBA1C 5.9 (H) 12/15/2014   Lab Results  Component Value Date   CHOL 95 12/26/2015   HDL 42 12/26/2015   LDLCALC 46 12/26/2015   TRIG 37 (A) 12/26/2015   CHOLHDL 2.6 12/07/2014    Significant Diagnostic Results in last 30 days:  No results found.  Assessment/Plan  Insomnia Continue trazodone 75 mg daily, add melatonin 5 mg qhs given his trouble falling asleep and monitor  Hypothyroidism Lab Results  Component Value Date   TSH 8.59 (A) 06/06/2016   Check tsh with him on amiodarone. Currently on levothyroxine 150 mcg daily  BPH No urinary obstruction. Has UI. Continue flomax  afib Controlled HR continue carvedilol 3.125 mg bid with amiodarone,  monitor  Chronic diastolic CHF Continue carvedilol, furosemide and kcl. Monitor clinically. BMP reviewed  ckd stage 3 Monitor renal function    Family/ staff Communication: reviewed care plan with patient and charge nurse.    Labs/tests ordered:  TSH   Blanchie Serve, MD Internal Medicine [facility] [place], [place]  [address] Cell Phone (Monday-Friday 8 am - 5 pm): [phone] On Call: [phone] and follow prompts after 5 pm and on weekends Office Phone: [phone] Office Fax: [phone]

## 2016-09-02 ENCOUNTER — Encounter: Payer: Self-pay | Admitting: Nurse Practitioner

## 2016-09-02 ENCOUNTER — Non-Acute Institutional Stay (SKILLED_NURSING_FACILITY): Payer: Medicare Other | Admitting: Nurse Practitioner

## 2016-09-02 DIAGNOSIS — J189 Pneumonia, unspecified organism: Secondary | ICD-10-CM | POA: Insufficient documentation

## 2016-09-02 DIAGNOSIS — S2242XA Multiple fractures of ribs, left side, initial encounter for closed fracture: Secondary | ICD-10-CM | POA: Diagnosis not present

## 2016-09-02 DIAGNOSIS — G4700 Insomnia, unspecified: Secondary | ICD-10-CM

## 2016-09-02 DIAGNOSIS — R079 Chest pain, unspecified: Secondary | ICD-10-CM | POA: Diagnosis not present

## 2016-09-02 DIAGNOSIS — J181 Lobar pneumonia, unspecified organism: Secondary | ICD-10-CM | POA: Diagnosis not present

## 2016-09-02 DIAGNOSIS — E039 Hypothyroidism, unspecified: Secondary | ICD-10-CM | POA: Diagnosis not present

## 2016-09-02 DIAGNOSIS — D509 Iron deficiency anemia, unspecified: Secondary | ICD-10-CM | POA: Diagnosis not present

## 2016-09-02 DIAGNOSIS — I5032 Chronic diastolic (congestive) heart failure: Secondary | ICD-10-CM | POA: Diagnosis not present

## 2016-09-02 DIAGNOSIS — N182 Chronic kidney disease, stage 2 (mild): Secondary | ICD-10-CM | POA: Diagnosis not present

## 2016-09-02 DIAGNOSIS — S2232XA Fracture of one rib, left side, initial encounter for closed fracture: Secondary | ICD-10-CM | POA: Insufficient documentation

## 2016-09-02 LAB — CBC AND DIFFERENTIAL
HCT: 35 — AB (ref 41–53)
Hemoglobin: 12 — AB (ref 13.5–17.5)
Platelets: 174 (ref 150–399)
WBC: 13

## 2016-09-02 LAB — BASIC METABOLIC PANEL
BUN: 22 — AB (ref 4–21)
CREATININE: 1.1 (ref <=1.3)
GLUCOSE: 89
POTASSIUM: 3.9 (ref 3.4–5.3)
SODIUM: 142 (ref 137–147)

## 2016-09-02 NOTE — Assessment & Plan Note (Addendum)
CXR 716/18 showed patchy right mid and lower lung opacities, chronic bilateral lower rib fractures. Will start Avelox 400mg  po qd x 2 weeks. Obtain X-ray R+L ribs ro evaluate incidental finding of the chronic bilateral lower rib fractures. Pending CBC

## 2016-09-02 NOTE — Assessment & Plan Note (Signed)
Clinically compensated, BLE is not apparent, continue Furosemide 10mg  daily, 03/19/16 BNP 187, update BMP

## 2016-09-02 NOTE — Assessment & Plan Note (Signed)
09/02/16 X-ray mild widening of the posterior of the left ninth an tenth ribs compatible with nondisplaced rib fractures of indeterminate age. Bony structures are osteoporotic. Adding Ca and Vit D bid. Observe.

## 2016-09-02 NOTE — Progress Notes (Signed)
Location:  [facility] Room Number: 105 Place of Service:  SNF (31) Provider:  Juli Odom, Manxie  NP  Blanchie Serve, MD  Patient Care Team: Blanchie Serve, MD as PCP - General (Internal Medicine) Zebedee Iba., MD as Referring Physician (Ophthalmology) Darlin Coco, MD as Consulting Physician (Cardiology) Garvin Fila, MD as Consulting Physician (Neurology) Festus Aloe, MD as Consulting Physician (Urology) Marilynne Halsted, MD as Referring Physician (Ophthalmology) Lavonna Monarch, MD as Consulting Physician (Dermatology) Gerarda Fraction, MD as Referring Physician (Ophthalmology) Blessen Kimbrough X, NP as Nurse Practitioner (Internal Medicine)  Extended Emergency Contact Information Primary Emergency Contact: Farr,Laura Address: [address] [place] [address] Johnnette Litter of [place] Phone: [phone] Mobile Phone: [phone] Relation: Daughter Secondary Emergency Contact: Genia Plants States of [place] Phone: [phone] Relation: Daughter  Code Status:  DNR Goals of care: Advanced Directive information Advanced Directives 09/02/2016  Does Patient Have a Medical Advance Directive? Yes  Type of Advance Directive Out of facility DNR (pink MOST or yellow form)  Does patient want to make changes to medical advance directive? No - Patient declined  Would patient like information on creating a medical advance directive? No - Patient declined  Pre-existing out of facility DNR order (yellow form or pink MOST form) Yellow form placed in chart (order not valid for inpatient use)     Chief Complaint  Patient presents with  . Acute Visit    Pneumonia    HPI:  Pt is a 83 y.o. male seen today for an acute visit for Pneumonia, low grade T100.1, leaning to his left, lethargy  09/01/16, 09/02/16 resolved above symptoms except generalized weakness, CXR 716/18 showed patchy right mid and lower lung opacities, chronic  bilateral lower rib fractures. Pending BMP. He denied chest pain with deep breath and palpation, no O2 desaturation    Hx of edema BLE/CHF, not apparent today, on Furosemide 10mg , BNP 187 03/19/16. His mood is manage on Trazodone 75mg  nightly   Past Medical History:  Diagnosis Date  . Abnormality of gait 07/04/2012  . Actinic keratoses   . Arthritis   . Benign prostatic hyperplasia   . BPH (benign prostatic hyperplasia)   . CAD (coronary artery disease) of artery bypass graft 12/02/2013  . CAD (coronary artery disease) of bypass graft    a. s/p CABG 1991 without subsequent interventions.  . Chorioretinitis   . Chronic diastolic CHF (congestive heart failure) ([place])   . Colon cancer ([place])    a. admitted to [facility] 12/07/14 with profound iron deficiency anemia Hgb 4.5, melena, with new diagnosis of invasive adenocarcinoma of colon s/p lap R colectomy on 12/15/14.  . Dementia   . Diverticulitis   . Dizziness and giddiness 07/04/2012  . Edema   . Essential hypertension   . Hereditary and idiopathic peripheral neuropathy 07/04/2012   a history of chronic sensory polyneuropathy of undetermined origin with abnormality of gait. He is walking with a rolling walker. Last MRA of the head January 2013 shows no significant stenosis, MRA of the neck shows mild stenosis of the proximal left internal carotid artery.    Marland Kitchen History of atrial fibrillation   . HLD (hyperlipidemia)   . HTN (hypertension) 12/01/2013  . Hyperlipidemia   . Hypothyroidism 12/01/2013  . Hypothyroidism   . 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 Internal carotid artery stenosis    left  . Iron deficiency anemia    a.  11/2014 - Hgb 4.5 in setting of newly dx colon CA.   . Memory loss   . Mild left ventricular hypertrophy   . Neuromuscular disorder ([place])   . PAF (paroxysmal atrial fibrillation) ([place])    a. Not on anticoag due to bleeding and fall risk.  . Panuveitis   . Peripheral autonomic neuropathy in disorders  classified elsewhere(337.1) 07/04/2012  . Prostatitis, acute    1991  . Retinitis   . Stroke ([place])   . Thyroid disease   . Transient cerebral ischemia 07/04/2012   a posterior circulation TIA in October 2009.    . 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 APPENDECTOMY    . CARDIAC CATHETERIZATION    . COLON RESECTION N/A 12/15/2014   Procedure: Laparoscopic partial colectomy;  Surgeon: Autumn Messing III, MD;  Location: [facility] ORS;  Service: General;  Laterality: N/A;  . COLONOSCOPY WITH PROPOFOL N/A 12/12/2014   Procedure: COLONOSCOPY WITH PROPOFOL;  Surgeon: Gatha Mayer, MD;  Location: [facility] ENDOSCOPY;  Service: Endoscopy;  Laterality: N/A;  . CORONARY ARTERY BYPASS GRAFT  1991   grafts to LAD and RCA  . CORONARY ARTERY BYPASS GRAFT  1991    Allergies  Allergen Reactions  . Sulfa Antibiotics Other (See Comments)    unknown  . Ativan [Lorazepam] Anxiety    Outpatient Encounter Prescriptions as of 09/02/2016  Medication Sig  . acetaminophen (TYLENOL) 325 MG tablet Take 650 mg by mouth 2 (two) times daily. Take 2 every 6 hours as needed for mild pain  . amiodarone (PACERONE) 200 MG tablet Take 1 tablet (200 mg total) by mouth daily.  . Artificial Tear Ointment (REFRESH LACRI-LUBE) OINT Apply to eye at bedtime.  . carvedilol (COREG) 3.125 MG tablet Take 1 tablet (3.125 mg total) by mouth 2 (two) times daily with a meal.  . furosemide (LASIX) 20 MG tablet Take 10 mg by mouth daily.   Marland Kitchen levothyroxine (SYNTHROID, LEVOTHROID) 150 MCG tablet Take 150 mcg by mouth daily before breakfast.  . Melatonin 5 MG TABS Take by mouth. Give 1 tablet 1/2 hour before bedtime.  . moxifloxacin (AVELOX) 400 MG tablet Take 400 mg by mouth daily at 8 pm.  . polyethylene glycol (MIRALAX / GLYCOLAX) packet Take 17 g by mouth daily as needed.   . potassium chloride (K-DUR) 10 MEQ tablet Take 10 mEq by mouth daily.   Marland Kitchen saccharomyces boulardii (FLORASTOR) 250 MG  capsule Take 250 mg by mouth 2 (two) times daily.  . tamsulosin (FLOMAX) 0.4 MG CAPS capsule Take 0.8 mg by mouth daily.   . traZODone (DESYREL) 150 MG tablet Take 75 mg by mouth at bedtime.   No facility-administered encounter medications on file as of 09/02/2016.     Review of Systems  Constitutional: Negative for chills, diaphoresis, fatigue and fever.  HENT: Positive for hearing loss. Negative for congestion and ear pain.   Respiratory: Negative for cough and shortness of breath.   Cardiovascular: Negative for chest pain, palpitations and leg swelling.  Gastrointestinal: Positive for constipation. Negative for abdominal pain, diarrhea and nausea.  Endocrine:       TSH 8.59 on 06/06/16  Genitourinary: Positive for frequency. Negative for dysuria and urgency.  Musculoskeletal: Positive for arthralgias. Negative for back pain and gait problem.       No longer ambulatory.   Skin: Negative for rash.       Healed abd laparoscopic surgical scars. Skin laceration on top of  the right foot.   Neurological:       Peripheral neuropathy  Psychiatric/Behavioral: Positive for confusion. Negative for decreased concentration, hallucinations and suicidal ideas. The patient is not nervous/anxious.        Occasionally combative/belligerent   when redirected    Immunization History  Administered Date(s) Administered  . Influenza-Unspecified 12/22/2013, 11/09/2014, 12/05/2015  . PPD Test 01/03/2015  . Pneumococcal Polysaccharide-23 12/02/1993  . Tdap 01/08/2013  . Zoster 12/03/2007   Pertinent  Health Maintenance Due  Topic Date Due  . PNA vac Low Risk Adult (2 of 2 - PCV13) 02/18/2017 (Originally 12/03/1994)  . INFLUENZA VACCINE  09/18/2016   Fall Risk  02/10/2015 06/27/2014  Falls in the past year? Yes No  Number falls in past yr: 1 -  Injury with Fall? No -  Risk for fall due to : History of fall(s) Impaired balance/gait  Follow up Falls evaluation completed -   Functional Status Survey:     Vitals:   09/02/16 0901  BP: 140/80  Pulse: 80  Resp: 18  Temp: 98.6 F (37 C)  SpO2: 92%  Weight: 181 lb (82.1 kg)  Height: 6' (1.829 m)   Body mass index is 24.55 kg/m. Physical Exam  Constitutional: He appears well-developed and well-nourished. No distress.  HENT:  Head: Normocephalic and atraumatic.  Right Ear: External ear normal.  Left Ear: External ear normal.  Nose: Nose normal.  Mouth/Throat: Oropharynx is clear and moist. No oropharyngeal exudate.  Eyes: Pupils are equal, round, and reactive to light. Conjunctivae and EOM are normal. Right eye exhibits no discharge. Left eye exhibits no discharge. No scleral icterus.  Neck: Normal range of motion. Neck supple. No JVD present. No tracheal deviation present. No thyromegaly present.  Cardiovascular: Normal rate.  Exam reveals no gallop and no friction rub.   Murmur heard. Systolic murmur 7-2/5 right sternal border   Pulmonary/Chest: Effort normal. No stridor. No respiratory distress. He has no wheezes. He has rales. He exhibits no tenderness.  Abdominal: Soft. Bowel sounds are normal. He exhibits no distension and no mass. There is no tenderness.  Musculoskeletal: Normal range of motion. He exhibits tenderness. He exhibits no edema.  Hx of knee pain, no longer ambulatory.   Neurological: He is alert. He has normal reflexes. No cranial nerve deficit. He exhibits normal muscle tone. Coordination normal.  Skin: Skin is warm and dry. No rash noted. He is not diaphoretic. No erythema. No pallor.  Skin tear on the top of the right foot, healing nicely.   Psychiatric: He has a normal mood and affect. Thought content normal. Cognition and memory are impaired. He exhibits abnormal recent memory. He exhibits normal remote memory.  Memory deficits.     Labs reviewed:  Recent Labs  03/12/16 03/19/16 07/30/16  NA 142 141 140  K 3.7 3.9 4.0  BUN 20 19 25*  CREATININE 1.2 1.2 1.2    Recent Labs  01/16/16 03/19/16 07/30/16    AST 21 21 13*  ALT 13 16 12   ALKPHOS 77 83 57    Recent Labs  02/23/16 03/19/16 06/06/16  WBC 8.0 4.4 4.8  HGB 11.6* 12.8* 13.0*  HCT 36* 39* 37*  PLT 194 213 204   Lab Results  Component Value Date   TSH 8.59 (A) 06/06/2016   Lab Results  Component Value Date   HGBA1C 5.9 (H) 12/15/2014   Lab Results  Component Value Date   CHOL 95 12/26/2015   HDL 42 12/26/2015  LDLCALC 46 12/26/2015   TRIG 37 (A) 12/26/2015   CHOLHDL 2.6 12/07/2014    Significant Diagnostic Results in last 30 days:  No results found.  Assessment/Plan Pneumonia involving right lung CXR 716/18 showed patchy right mid and lower lung opacities, chronic bilateral lower rib fractures. Will start Avelox 400mg  po qd x 2 weeks. Obtain X-ray R+L ribs ro evaluate incidental finding of the chronic bilateral lower rib fractures. Pending CBC  Chronic diastolic CHF (congestive heart failure) (HCC) Clinically compensated, BLE is not apparent, continue Furosemide 10mg  daily, 03/19/16 BNP 187, update BMP    Insomnia Stable, continue Trazodone 75mg  po qhs.    Left rib fracture 09/02/16 X-ray mild widening of the posterior of the left ninth an tenth ribs compatible with nondisplaced rib fractures of indeterminate age. Bony structures are osteoporotic. Adding Ca and Vit D bid. Observe.      Family/ staff Communication: SNF MCU  Labs/tests ordered:  X-ray R+L ribs

## 2016-09-02 NOTE — Assessment & Plan Note (Signed)
Stable, continue Trazodone 75mg  po qhs.

## 2016-09-03 ENCOUNTER — Other Ambulatory Visit: Payer: Self-pay | Admitting: *Deleted

## 2016-09-03 DIAGNOSIS — E039 Hypothyroidism, unspecified: Secondary | ICD-10-CM | POA: Diagnosis not present

## 2016-09-03 DIAGNOSIS — N182 Chronic kidney disease, stage 2 (mild): Secondary | ICD-10-CM | POA: Diagnosis not present

## 2016-09-03 LAB — TSH: TSH: 3.01 (ref <=5.90)

## 2016-09-04 ENCOUNTER — Other Ambulatory Visit: Payer: Self-pay | Admitting: *Deleted

## 2016-09-11 DIAGNOSIS — G9009 Other idiopathic peripheral autonomic neuropathy: Secondary | ICD-10-CM | POA: Diagnosis not present

## 2016-09-11 DIAGNOSIS — I4891 Unspecified atrial fibrillation: Secondary | ICD-10-CM | POA: Diagnosis not present

## 2016-09-11 DIAGNOSIS — L570 Actinic keratosis: Secondary | ICD-10-CM | POA: Diagnosis not present

## 2016-09-11 DIAGNOSIS — R296 Repeated falls: Secondary | ICD-10-CM | POA: Diagnosis not present

## 2016-09-11 DIAGNOSIS — R2681 Unsteadiness on feet: Secondary | ICD-10-CM | POA: Diagnosis not present

## 2016-09-11 DIAGNOSIS — H3093 Unspecified chorioretinal inflammation, bilateral: Secondary | ICD-10-CM | POA: Diagnosis not present

## 2016-09-11 DIAGNOSIS — R293 Abnormal posture: Secondary | ICD-10-CM | POA: Diagnosis not present

## 2016-09-11 DIAGNOSIS — G459 Transient cerebral ischemic attack, unspecified: Secondary | ICD-10-CM | POA: Diagnosis not present

## 2016-09-11 DIAGNOSIS — R609 Edema, unspecified: Secondary | ICD-10-CM | POA: Diagnosis not present

## 2016-09-11 DIAGNOSIS — I10 Essential (primary) hypertension: Secondary | ICD-10-CM | POA: Diagnosis not present

## 2016-09-11 DIAGNOSIS — M25569 Pain in unspecified knee: Secondary | ICD-10-CM | POA: Diagnosis not present

## 2016-09-12 DIAGNOSIS — M25569 Pain in unspecified knee: Secondary | ICD-10-CM | POA: Diagnosis not present

## 2016-09-12 DIAGNOSIS — R609 Edema, unspecified: Secondary | ICD-10-CM | POA: Diagnosis not present

## 2016-09-12 DIAGNOSIS — R296 Repeated falls: Secondary | ICD-10-CM | POA: Diagnosis not present

## 2016-09-12 DIAGNOSIS — R2681 Unsteadiness on feet: Secondary | ICD-10-CM | POA: Diagnosis not present

## 2016-09-12 DIAGNOSIS — I10 Essential (primary) hypertension: Secondary | ICD-10-CM | POA: Diagnosis not present

## 2016-09-12 DIAGNOSIS — R293 Abnormal posture: Secondary | ICD-10-CM | POA: Diagnosis not present

## 2016-09-12 DIAGNOSIS — H3093 Unspecified chorioretinal inflammation, bilateral: Secondary | ICD-10-CM | POA: Diagnosis not present

## 2016-09-12 DIAGNOSIS — L570 Actinic keratosis: Secondary | ICD-10-CM | POA: Diagnosis not present

## 2016-09-12 DIAGNOSIS — I4891 Unspecified atrial fibrillation: Secondary | ICD-10-CM | POA: Diagnosis not present

## 2016-09-12 DIAGNOSIS — G459 Transient cerebral ischemic attack, unspecified: Secondary | ICD-10-CM | POA: Diagnosis not present

## 2016-09-12 DIAGNOSIS — G9009 Other idiopathic peripheral autonomic neuropathy: Secondary | ICD-10-CM | POA: Diagnosis not present

## 2016-09-16 DIAGNOSIS — J189 Pneumonia, unspecified organism: Secondary | ICD-10-CM | POA: Diagnosis not present

## 2016-09-17 DIAGNOSIS — R2681 Unsteadiness on feet: Secondary | ICD-10-CM | POA: Diagnosis not present

## 2016-09-17 DIAGNOSIS — I4891 Unspecified atrial fibrillation: Secondary | ICD-10-CM | POA: Diagnosis not present

## 2016-09-17 DIAGNOSIS — I10 Essential (primary) hypertension: Secondary | ICD-10-CM | POA: Diagnosis not present

## 2016-09-17 DIAGNOSIS — R293 Abnormal posture: Secondary | ICD-10-CM | POA: Diagnosis not present

## 2016-09-17 DIAGNOSIS — L570 Actinic keratosis: Secondary | ICD-10-CM | POA: Diagnosis not present

## 2016-09-17 DIAGNOSIS — G459 Transient cerebral ischemic attack, unspecified: Secondary | ICD-10-CM | POA: Diagnosis not present

## 2016-09-17 DIAGNOSIS — H3093 Unspecified chorioretinal inflammation, bilateral: Secondary | ICD-10-CM | POA: Diagnosis not present

## 2016-09-17 DIAGNOSIS — R609 Edema, unspecified: Secondary | ICD-10-CM | POA: Diagnosis not present

## 2016-09-17 DIAGNOSIS — M25569 Pain in unspecified knee: Secondary | ICD-10-CM | POA: Diagnosis not present

## 2016-09-17 DIAGNOSIS — G9009 Other idiopathic peripheral autonomic neuropathy: Secondary | ICD-10-CM | POA: Diagnosis not present

## 2016-09-17 DIAGNOSIS — R296 Repeated falls: Secondary | ICD-10-CM | POA: Diagnosis not present

## 2016-09-27 ENCOUNTER — Non-Acute Institutional Stay (SKILLED_NURSING_FACILITY): Payer: Medicare Other | Admitting: Nurse Practitioner

## 2016-09-27 ENCOUNTER — Encounter: Payer: Self-pay | Admitting: Nurse Practitioner

## 2016-09-27 DIAGNOSIS — I480 Paroxysmal atrial fibrillation: Secondary | ICD-10-CM | POA: Diagnosis not present

## 2016-09-27 DIAGNOSIS — F0281 Dementia in other diseases classified elsewhere with behavioral disturbance: Secondary | ICD-10-CM

## 2016-09-27 DIAGNOSIS — I5032 Chronic diastolic (congestive) heart failure: Secondary | ICD-10-CM | POA: Diagnosis not present

## 2016-09-27 DIAGNOSIS — E039 Hypothyroidism, unspecified: Secondary | ICD-10-CM | POA: Diagnosis not present

## 2016-09-27 DIAGNOSIS — I10 Essential (primary) hypertension: Secondary | ICD-10-CM | POA: Diagnosis not present

## 2016-09-27 DIAGNOSIS — G309 Alzheimer's disease, unspecified: Secondary | ICD-10-CM | POA: Diagnosis not present

## 2016-09-27 NOTE — Assessment & Plan Note (Addendum)
Controlled, continue Coreg 3.125mg  bid, Furosemide 10mg  daily, 09/02/16 Na 142, K 3.9, Bun 22, creat 1.09, wbc 13.0, Hgb 12.0, plt 174

## 2016-09-27 NOTE — Assessment & Plan Note (Signed)
Well controlled, last TSH 3.01 09/03/16, continue Levothyroxine 155mcg po daily

## 2016-09-27 NOTE — Assessment & Plan Note (Signed)
Off Namenda, confusion at his baseline, combative, aggressive,  resistant to care assistance

## 2016-09-27 NOTE — Assessment & Plan Note (Addendum)
Clinically compensated, BLE edema is not apparent, continue Furosemide 10mg  daily, 03/19/16 BNP 187

## 2016-09-27 NOTE — Assessment & Plan Note (Signed)
Heart rate is in control, continue Amiodarone 200mg  daily.

## 2016-09-27 NOTE — Progress Notes (Signed)
Location:  [facility] Room Number: 105 Place of Service:  SNF (31) Provider: Maja Mccaffery, Manxie  NP  Blanchie Serve, MD  Patient Care Team: Blanchie Serve, MD as PCP - General (Internal Medicine) Zebedee Iba., MD as Referring Physician (Ophthalmology) Darlin Coco, MD as Consulting Physician (Cardiology) Garvin Fila, MD as Consulting Physician (Neurology) Festus Aloe, MD as Consulting Physician (Urology) Marilynne Halsted, MD as Referring Physician (Ophthalmology) Lavonna Monarch, MD as Consulting Physician (Dermatology) Gerarda Fraction, MD as Referring Physician (Ophthalmology) Kenniya Westrich X, NP as Nurse Practitioner (Internal Medicine)  Extended Emergency Contact Information Primary Emergency Contact: Farr,Laura Address: [address] [place] [address] Johnnette Litter of [place] Phone: [phone] Mobile Phone: [phone] Relation: Daughter Secondary Emergency Contact: Genia Plants States of [place] Phone: [phone] Relation: Daughter  Code Status:  DNR Goals of care: Advanced Directive information Advanced Directives 09/27/2016  Does Patient Have a Medical Advance Directive? Yes  Type of Advance Directive Out of facility DNR (pink MOST or yellow form)  Does patient want to make changes to medical advance directive? No - Patient declined  Would patient like information on creating a medical advance directive? -  Pre-existing out of facility DNR order (yellow form or pink MOST form) -     Chief Complaint  Patient presents with  . Medical Management of Chronic Issues    HPI:  Pt is a 83 y.o. male seen today for medical management of chronic diseases.      Hx of anemia, s/p partial colectomy, last Hgb 12.0 09/02/16, not taking. Hypothyroidism taking Levothyroxine 161mcg, last TSH 3.01 09/03/16, HTN is controlled, taking Carvedilol 3.125mg  bid,  his memory has been gradual decline, he resides in [facility] for care assistance, off Namenda,  CHF, on Furosemide 10mg , BNP 187 03/19/16. His mood is stable on Trazodone 75mg  nightly. Afib, heart rate is in control, taking Carvedilol 3.125mg  bid, Amiodarone 200mg  daily.   Past Medical History:  Diagnosis Date  . Abnormality of gait 07/04/2012  . Actinic keratoses   . Arthritis   . Benign prostatic hyperplasia   . BPH (benign prostatic hyperplasia)   . CAD (coronary artery disease) of artery bypass graft 12/02/2013  . CAD (coronary artery disease) of bypass graft    a. s/p CABG 1991 without subsequent interventions.  . Chorioretinitis   . Chronic diastolic CHF (congestive heart failure) ([place])   . Colon cancer ([place])    a. admitted to [facility] 12/07/14 with profound iron deficiency anemia Hgb 4.5, melena, with new diagnosis of invasive adenocarcinoma of colon s/p lap R colectomy on 12/15/14.  . Dementia   . Diverticulitis   . Dizziness and giddiness 07/04/2012  . Edema   . Essential hypertension   . Hereditary and idiopathic peripheral neuropathy 07/04/2012   a history of chronic sensory polyneuropathy of undetermined origin with abnormality of gait. He is walking with a rolling walker. Last MRA of the head January 2013 shows no significant stenosis, MRA of the neck shows mild stenosis of the proximal left internal carotid artery.    Marland Kitchen History of atrial fibrillation   . HLD (hyperlipidemia)   . HTN (hypertension) 12/01/2013  . Hyperlipidemia   . Hypothyroidism 12/01/2013  . Hypothyroidism   . 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 Internal carotid artery stenosis    left  . Iron deficiency anemia    a. 11/2014 - Hgb 4.5 in setting  of newly dx colon CA.   . Memory loss   . Mild left ventricular hypertrophy   . Neuromuscular disorder ([place])   . PAF (paroxysmal atrial fibrillation) ([place])    a. Not on anticoag due to bleeding and fall risk.  . Panuveitis   . Peripheral autonomic neuropathy in disorders classified  elsewhere(337.1) 07/04/2012  . Prostatitis, acute    1991  . Retinitis   . Stroke ([place])   . Thyroid disease   . Transient cerebral ischemia 07/04/2012   a posterior circulation TIA in October 2009.    . 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 APPENDECTOMY    . CARDIAC CATHETERIZATION    . COLON RESECTION N/A 12/15/2014   Procedure: Laparoscopic partial colectomy;  Surgeon: Autumn Messing III, MD;  [facility];  Service: General;  Laterality: N/A;  . COLONOSCOPY WITH PROPOFOL N/A 12/12/2014   Procedure: COLONOSCOPY WITH PROPOFOL;  Surgeon: Gatha Mayer, MD;  [facility];  Service: Endoscopy;  Laterality: N/A;  . CORONARY ARTERY BYPASS GRAFT  1991   grafts to LAD and RCA  . CORONARY ARTERY BYPASS GRAFT  1991    Allergies  Allergen Reactions  . Sulfa Antibiotics Other (See Comments)    unknown  . Ativan [Lorazepam] Anxiety    Outpatient Encounter Prescriptions as of 09/27/2016  Medication Sig  . acetaminophen (TYLENOL) 325 MG tablet Take 650 mg by mouth 2 (two) times daily. Take 2 every 6 hours as needed for mild pain  . amiodarone (PACERONE) 200 MG tablet Take 1 tablet (200 mg total) by mouth daily.  . Artificial Tear Ointment (REFRESH LACRI-LUBE) OINT Apply to eye at bedtime.  . Calcium Carb-Cholecalciferol (CALCIUM-VITAMIN D) 600-400 MG-UNIT TABS Take 1 tablet by mouth 2 (two) times daily.  . carvedilol (COREG) 3.125 MG tablet Take 1 tablet (3.125 mg total) by mouth 2 (two) times daily with a meal.  . furosemide (LASIX) 20 MG tablet Take 10 mg by mouth daily.   Marland Kitchen levothyroxine (SYNTHROID, LEVOTHROID) 150 MCG tablet Take 150 mcg by mouth daily before breakfast.  . Melatonin 5 MG TABS Take by mouth. Give 1 tablet 1/2 hour before bedtime.  . polyethylene glycol (MIRALAX / GLYCOLAX) packet Take 17 g by mouth daily as needed.   . potassium chloride (K-DUR) 10 MEQ tablet Take 10 mEq by mouth daily.   . tamsulosin  (FLOMAX) 0.4 MG CAPS capsule Take 0.8 mg by mouth daily.   . traZODone (DESYREL) 150 MG tablet Take 75 mg by mouth at bedtime.   No facility-administered encounter medications on file as of 09/27/2016.    ROS is provided by the patient with assistance of staff Review of Systems  Constitutional: Positive for activity change. Negative for appetite change, fatigue and fever.  HENT: Positive for hearing loss. Negative for congestion.   Respiratory: Negative for cough and choking.   Cardiovascular: Negative for chest pain, palpitations and leg swelling.  Gastrointestinal: Negative for abdominal distention, abdominal pain, constipation, diarrhea and nausea.  Endocrine:       TSH 8.59 on 06/06/16  Genitourinary: Positive for frequency. Negative for dysuria and urgency.  Musculoskeletal: Positive for arthralgias. Negative for back pain.       No longer ambulatory. Pain in knees  Skin: Negative for rash.       Healed abd laparoscopic surgical scars.   Neurological: Negative for dizziness and headaches.  Psychiatric/Behavioral: Positive for agitation, behavioral problems and confusion. Negative for  hallucinations. The patient is not nervous/anxious.        Occasionally combative/belligerent occasionally when he is assisted with personal care.     Immunization History  Administered Date(s) Administered  . Influenza-Unspecified 12/22/2013, 11/09/2014, 12/05/2015  . PPD Test 01/03/2015  . Pneumococcal Polysaccharide-23 12/02/1993  . Tdap 01/08/2013  . Zoster 12/03/2007   Pertinent  Health Maintenance Due  Topic Date Due  . INFLUENZA VACCINE  09/18/2016  . PNA vac Low Risk Adult (2 of 2 - PCV13) 02/18/2017 (Originally 12/03/1994)   Fall Risk  02/10/2015 06/27/2014  Falls in the past year? Yes No  Number falls in past yr: 1 -  Injury with Fall? No -  Risk for fall due to : History of fall(s) Impaired balance/gait  Follow up Falls evaluation completed -   Functional Status Survey:     Vitals:   09/27/16 1132  BP: 132/68  Pulse: 72  Resp: 16  Temp: 98.2 F (36.8 C)  Weight: 181 lb 6.4 oz (82.3 kg)  Height: 6' (1.829 m)   Body mass index is 24.6 kg/m. Physical Exam  Constitutional: He appears well-developed and well-nourished.  HENT:  Head: Normocephalic and atraumatic.  Eyes: Pupils are equal, round, and reactive to light. Conjunctivae and EOM are normal.  Neck: Normal range of motion. Neck supple.  Cardiovascular: Normal rate.  Exam reveals no gallop and no friction rub.   Murmur heard. Systolic murmur 9-4/8 right sternal border   Pulmonary/Chest: Effort normal. No respiratory distress. He has rales.  Abdominal: Soft. Bowel sounds are normal. He exhibits no distension. There is no tenderness.  Musculoskeletal: Normal range of motion. He exhibits tenderness. He exhibits no edema.  Hx of knee pain, no longer ambulatory.   Neurological: He is alert. He has normal reflexes. He exhibits normal muscle tone.  Oriented to self only  Skin: Skin is warm and dry. No rash noted. No erythema. No pallor.  Skin tear on the top of the right foot, healing nicely.   Psychiatric: He has a normal mood and affect. Cognition and memory are impaired. He exhibits abnormal recent memory. He exhibits normal remote memory.  Memory deficits.     Labs reviewed:  Recent Labs  03/19/16 07/30/16 09/02/16  NA 141 140 142  K 3.9 4.0 3.9  BUN 19 25* 22*  CREATININE 1.2 1.2 1.1    Recent Labs  01/16/16 03/19/16 07/30/16  AST 21 21 13*  ALT 13 16 12   ALKPHOS 77 83 57    Recent Labs  03/19/16 06/06/16 09/02/16  WBC 4.4 4.8 13.0  HGB 12.8* 13.0* 12.0*  HCT 39* 37* 35*  PLT 213 204 174   Lab Results  Component Value Date   TSH 3.01 09/03/2016   Lab Results  Component Value Date   HGBA1C 5.9 (H) 12/15/2014   Lab Results  Component Value Date   CHOL 95 12/26/2015   HDL 42 12/26/2015   LDLCALC 46 12/26/2015   TRIG 37 (A) 12/26/2015   CHOLHDL 2.6 12/07/2014     Significant Diagnostic Results in last 30 days:  No results found.  Assessment/Plan Alzheimer's disease Off Namenda, confusion at his baseline, combative, aggressive,  resistant to care assistance  Essential hypertension Controlled, continue Coreg 3.125mg  bid, Furosemide 10mg  daily, 09/02/16 Na 142, K 3.9, Bun 22, creat 1.09, wbc 13.0, Hgb 12.0, plt 174  PAF (paroxysmal atrial fibrillation) (HCC) Heart rate is in control, continue Amiodarone 200mg  daily.   Chronic diastolic CHF (congestive heart failure) (HCC) Clinically  compensated, BLE edema is not apparent, continue Furosemide 10mg  daily, 03/19/16 BNP 187  Hypothyroidism Well controlled, last TSH 3.01 09/03/16, continue Levothyroxine 158mcg po daily     Family/ staff Communication: plan of care reviewed with the patient and charge nurse  Labs/tests ordered:  none  Time spend 25 minutes

## 2016-10-07 DIAGNOSIS — R531 Weakness: Secondary | ICD-10-CM | POA: Diagnosis not present

## 2016-10-23 IMAGING — DX DG CHEST 2V
2 series · 2 of 2 positions shown · non-contrast
Comparison: Radiograph 04/17/2012

CLINICAL DATA: Mid chest pain for 1 day

EXAM:
CHEST  2 VIEW

[chest lat]
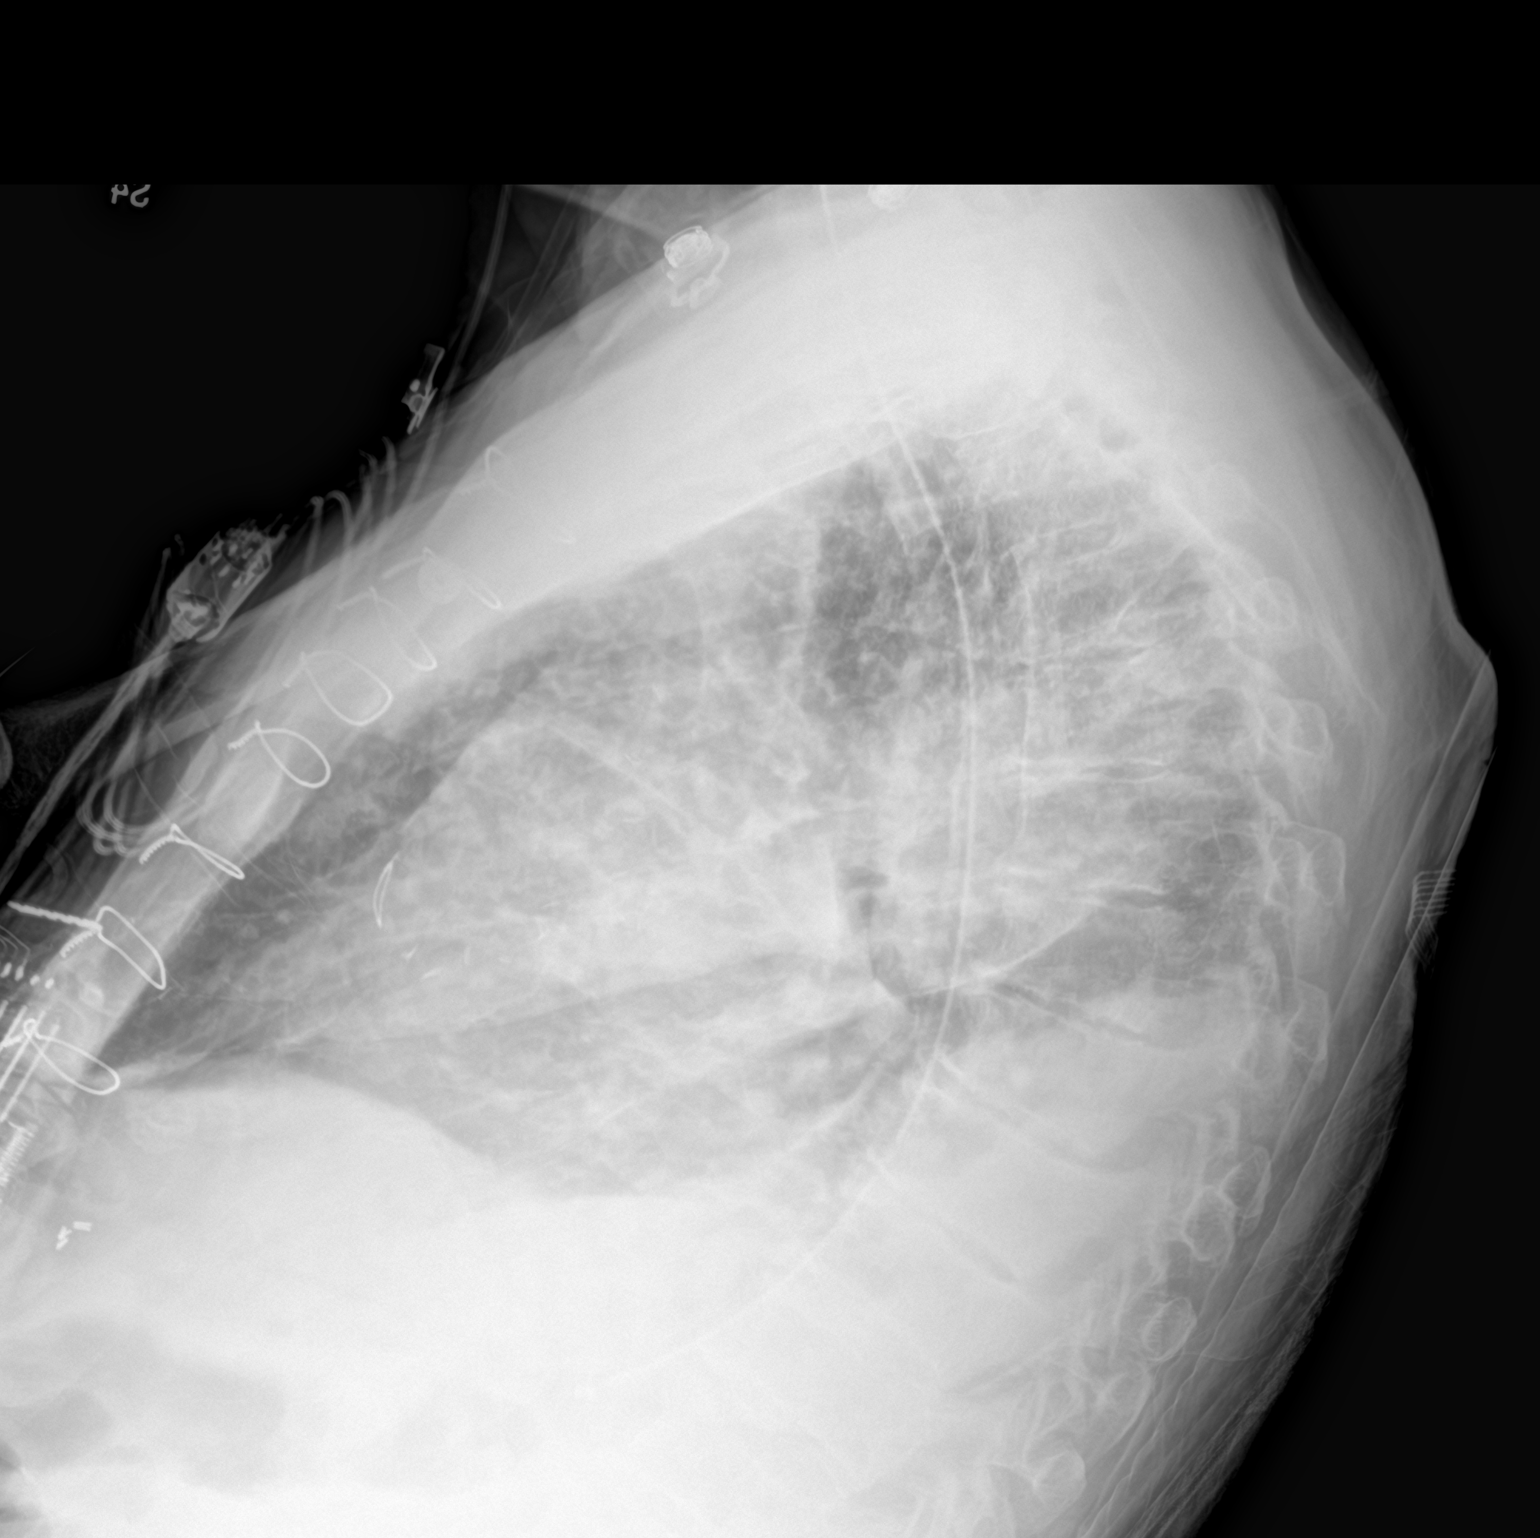

[chest ap]
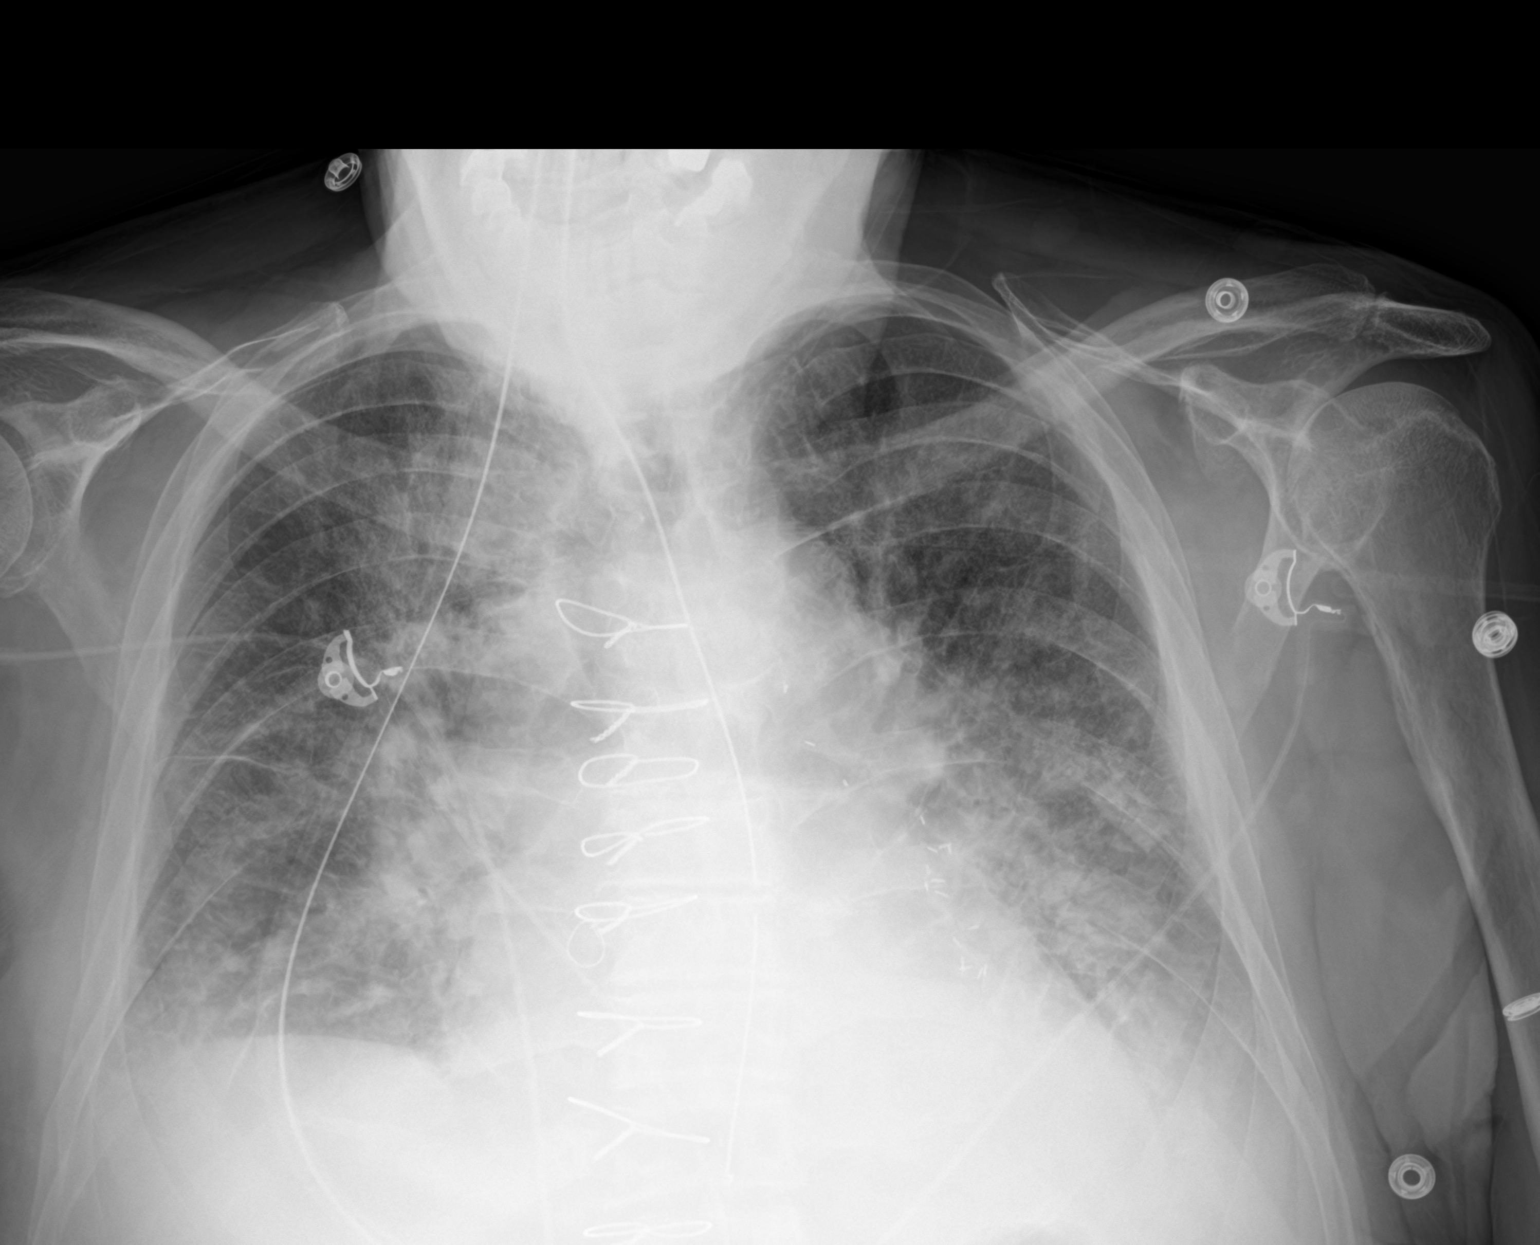

[2 of 2 positions shown; findings below may reference images not displayed]

FINDINGS: NG tube with tip at the gastroesophageal junction. Recommend
advancement. Cardiac silhouette is mildly enlarged compared to
prior. There is new perihilar airspace disease bilateral with
moderate effusions. There is a RIGHT suprahilar opacity which likely
represents airspace disease.
IMPRESSION: 1. Cardiomegaly with bilateral central airspace disease and moderate
effusions suggest congestive heart failure. Cannot exclude
multifocal pneumonia.
2. RIGHT suprahilar density likely represents airspace disease.
Recommend follow-up radiographs to ensure resolution.
3. NG tube terminates at the GE junction.  Recommend advancement.

## 2016-10-24 ENCOUNTER — Non-Acute Institutional Stay (SKILLED_NURSING_FACILITY): Payer: Medicare Other

## 2016-10-24 DIAGNOSIS — Z0000 Encounter for general adult medical examination without abnormal findings: Secondary | ICD-10-CM | POA: Diagnosis not present

## 2016-10-24 NOTE — Patient Instructions (Signed)
Jesse Burton , Thank you for taking time to come for your Medicare Wellness Visit. I appreciate your ongoing commitment to your health goals. Please review the following plan we discussed and let me know if I can assist you in the future.   Screening recommendations/referrals: Colonoscopy excluded, pt over age 81 Recommended yearly ophthalmology/optometry visit for glaucoma screening and checkup Recommended yearly dental visit for hygiene and checkup  Vaccinations: Influenza vaccine due Pneumococcal vaccine 23 due Tdap vaccine up to date. Due 01/09/23 Shingles vaccine not in records  Advanced directives: DNR in chart, copies of health care power of attorney and living will are needed   Conditions/risks identified: None  Next appointment: Dr. Bubba Camp makes rounds  Preventive Care 27 Years and Older, Male Preventive care refers to lifestyle choices and visits with your health care provider that can promote health and wellness. What does preventive care include?  A yearly physical exam. This is also called an annual well check.  Dental exams once or twice a year.  Routine eye exams. Ask your health care provider how often you should have your eyes checked.  Personal lifestyle choices, including:  Daily care of your teeth and gums.  Regular physical activity.  Eating a healthy diet.  Avoiding tobacco and drug use.  Limiting alcohol use.  Practicing safe sex.  Taking low doses of aspirin every day.  Taking vitamin and mineral supplements as recommended by your health care provider. What happens during an annual well check? The services and screenings done by your health care provider during your annual well check will depend on your age, overall health, lifestyle risk factors, and family history of disease. Counseling  Your health care provider may ask you questions about your:  Alcohol use.  Tobacco use.  Drug use.  Emotional well-being.  Home and relationship  well-being.  Sexual activity.  Eating habits.  History of falls.  Memory and ability to understand (cognition).  Work and work Statistician. Screening  You may have the following tests or measurements:  Height, weight, and BMI.  Blood pressure.  Lipid and cholesterol levels. These may be checked every 5 years, or more frequently if you are over 38 years old.  Skin check.  Lung cancer screening. You may have this screening every year starting at age 81 if you have a 30-pack-year history of smoking and currently smoke or have quit within the past 15 years.  Fecal occult blood test (FOBT) of the stool. You may have this test every year starting at age 55.  Flexible sigmoidoscopy or colonoscopy. You may have a sigmoidoscopy every 5 years or a colonoscopy every 10 years starting at age 32.  Prostate cancer screening. Recommendations will vary depending on your family history and other risks.  Hepatitis C blood test.  Hepatitis B blood test.  Sexually transmitted disease (STD) testing.  Diabetes screening. This is done by checking your blood sugar (glucose) after you have not eaten for a while (fasting). You may have this done every 1-3 years.  Abdominal aortic aneurysm (AAA) screening. You may need this if you are a current or former smoker.  Osteoporosis. You may be screened starting at age 80 if you are at high risk. Talk with your health care provider about your test results, treatment options, and if necessary, the need for more tests. Vaccines  Your health care provider may recommend certain vaccines, such as:  Influenza vaccine. This is recommended every year.  Tetanus, diphtheria, and acellular pertussis (Tdap, Td) vaccine.  You may need a Td booster every 10 years.  Zoster vaccine. You may need this after age 85.  Pneumococcal 13-valent conjugate (PCV13) vaccine. One dose is recommended after age 75.  Pneumococcal polysaccharide (PPSV23) vaccine. One dose is  recommended after age 44. Talk to your health care provider about which screenings and vaccines you need and how often you need them. This information is not intended to replace advice given to you by your health care provider. Make sure you discuss any questions you have with your health care provider. Document Released: 03/03/2015 Document Revised: 10/25/2015 Document Reviewed: 12/06/2014 Elsevier Interactive Patient Education  2017 [place] Prevention in the Home Falls can cause injuries. They can happen to people of all ages. There are many things you can do to make your home safe and to help prevent falls. What can I do on the outside of my home?  Regularly fix the edges of walkways and driveways and fix any cracks.  Remove anything that might make you trip as you walk through a door, such as a raised step or threshold.  Trim any bushes or trees on the path to your home.  Use bright outdoor lighting.  Clear any walking paths of anything that might make someone trip, such as rocks or tools.  Regularly check to see if handrails are loose or broken. Make sure that both sides of any steps have handrails.  Any raised decks and porches should have guardrails on the edges.  Have any leaves, snow, or ice cleared regularly.  Use sand or salt on walking paths during winter.  Clean up any spills in your garage right away. This includes oil or grease spills. What can I do in the bathroom?  Use night lights.  Install grab bars by the toilet and in the tub and shower. Do not use towel bars as grab bars.  Use non-skid mats or decals in the tub or shower.  If you need to sit down in the shower, use a plastic, non-slip stool.  Keep the floor dry. Clean up any water that spills on the floor as soon as it happens.  Remove soap buildup in the tub or shower regularly.  Attach bath mats securely with double-sided non-slip rug tape.  Do not have throw rugs and other things on  the floor that can make you trip. What can I do in the bedroom?  Use night lights.  Make sure that you have a light by your bed that is easy to reach.  Do not use any sheets or blankets that are too big for your bed. They should not hang down onto the floor.  Have a firm chair that has side arms. You can use this for support while you get dressed.  Do not have throw rugs and other things on the floor that can make you trip. What can I do in the kitchen?  Clean up any spills right away.  Avoid walking on wet floors.  Keep items that you use a lot in easy-to-reach places.  If you need to reach something above you, use a strong step stool that has a grab bar.  Keep electrical cords out of the way.  Do not use floor polish or wax that makes floors slippery. If you must use wax, use non-skid floor wax.  Do not have throw rugs and other things on the floor that can make you trip. What can I do with my stairs?  Do not leave any  items on the stairs.  Make sure that there are handrails on both sides of the stairs and use them. Fix handrails that are broken or loose. Make sure that handrails are as long as the stairways.  Check any carpeting to make sure that it is firmly attached to the stairs. Fix any carpet that is loose or worn.  Avoid having throw rugs at the top or bottom of the stairs. If you do have throw rugs, attach them to the floor with carpet tape.  Make sure that you have a light switch at the top of the stairs and the bottom of the stairs. If you do not have them, ask someone to add them for you. What else can I do to help prevent falls?  Wear shoes that:  Do not have high heels.  Have rubber bottoms.  Are comfortable and fit you well.  Are closed at the toe. Do not wear sandals.  If you use a stepladder:  Make sure that it is fully opened. Do not climb a closed stepladder.  Make sure that both sides of the stepladder are locked into place.  Ask someone to  hold it for you, if possible.  Clearly mark and make sure that you can see:  Any grab bars or handrails.  First and last steps.  Where the edge of each step is.  Use tools that help you move around (mobility aids) if they are needed. These include:  Canes.  Walkers.  Scooters.  Crutches.  Turn on the lights when you go into a dark area. Replace any light bulbs as soon as they burn out.  Set up your furniture so you have a clear path. Avoid moving your furniture around.  If any of your floors are uneven, fix them.  If there are any pets around you, be aware of where they are.  Review your medicines with your doctor. Some medicines can make you feel dizzy. This can increase your chance of falling. Ask your doctor what other things that you can do to help prevent falls. This information is not intended to replace advice given to you by your health care provider. Make sure you discuss any questions you have with your health care provider. Document Released: 12/01/2008 Document Revised: 07/13/2015 Document Reviewed: 03/11/2014 Elsevier Interactive Patient Education  2017 [employer].

## 2016-10-24 NOTE — Progress Notes (Signed)
Subjective:   Jesse Burton is a 83 y.o. male who presents for an Initial Medicare Annual Wellness Visit at [facility]; incapacitated patient unable to answer questions appropriately    Objective:    Today's Vitals   10/24/16 1554  BP: 130/70  Pulse: 66  Temp: 97.6 F (36.4 C)  TempSrc: Oral  SpO2: 95%  Weight: 181 lb (82.1 kg)  Height: 6' (1.829 m)   Body mass index is 24.55 kg/m.  Current Medications (verified) Outpatient Encounter Prescriptions as of 10/24/2016  Medication Sig  . acetaminophen (TYLENOL) 325 MG tablet Take 650 mg by mouth 2 (two) times daily. Take 2 every 6 hours as needed for mild pain  . amiodarone (PACERONE) 200 MG tablet Take 1 tablet (200 mg total) by mouth daily.  . Artificial Tear Ointment (REFRESH LACRI-LUBE) OINT Apply to eye at bedtime.  . Calcium Carb-Cholecalciferol (CALCIUM-VITAMIN D) 600-400 MG-UNIT TABS Take 1 tablet by mouth 2 (two) times daily.  . carvedilol (COREG) 3.125 MG tablet Take 1 tablet (3.125 mg total) by mouth 2 (two) times daily with a meal.  . furosemide (LASIX) 20 MG tablet Take 10 mg by mouth daily.   Marland Kitchen levothyroxine (SYNTHROID, LEVOTHROID) 150 MCG tablet Take 150 mcg by mouth daily before breakfast.  . Melatonin 5 MG TABS Take by mouth. Give 1 tablet 1/2 hour before bedtime.  . polyethylene glycol (MIRALAX / GLYCOLAX) packet Take 17 g by mouth daily as needed.   . potassium chloride (K-DUR) 10 MEQ tablet Take 10 mEq by mouth daily.   . tamsulosin (FLOMAX) 0.4 MG CAPS capsule Take 0.8 mg by mouth daily.   . traZODone (DESYREL) 150 MG tablet Take 75 mg by mouth at bedtime.   No facility-administered encounter medications on file as of 10/24/2016.     Allergies (verified) Sulfa antibiotics and Ativan [lorazepam]   History: Past Medical History:  Diagnosis Date  . Abnormality of gait 07/04/2012  . Actinic keratoses   . Arthritis   . Benign prostatic hyperplasia   . BPH (benign prostatic hyperplasia)   . CAD  (coronary artery disease) of artery bypass graft 12/02/2013  . CAD (coronary artery disease) of bypass graft    a. s/p CABG 1991 without subsequent interventions.  . Chorioretinitis   . Chronic diastolic CHF (congestive heart failure) ([place])   . Colon cancer ([place])    a. admitted to [facility] 12/07/14 with profound iron deficiency anemia Hgb 4.5, melena, with new diagnosis of invasive adenocarcinoma of colon s/p lap R colectomy on 12/15/14.  . Dementia   . Diverticulitis   . Dizziness and giddiness 07/04/2012  . Edema   . Essential hypertension   . Hereditary and idiopathic peripheral neuropathy 07/04/2012   a history of chronic sensory polyneuropathy of undetermined origin with abnormality of gait. He is walking with a rolling walker. Last MRA of the head January 2013 shows no significant stenosis, MRA of the neck shows mild stenosis of the proximal left internal carotid artery.    Marland Kitchen History of atrial fibrillation   . HLD (hyperlipidemia)   . HTN (hypertension) 12/01/2013  . Hyperlipidemia   . Hypothyroidism 12/01/2013  . Hypothyroidism   . 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 Internal carotid artery stenosis    left  . Iron deficiency anemia    a. 11/2014 - Hgb 4.5 in setting of newly dx colon CA.   . Memory loss   . Mild left ventricular hypertrophy   . Neuromuscular disorder (  [place])   . PAF (paroxysmal atrial fibrillation) ([place])    a. Not on anticoag due to bleeding and fall risk.  . Panuveitis   . Peripheral autonomic neuropathy in disorders classified elsewhere(337.1) 07/04/2012  . Prostatitis, acute    1991  . Retinitis   . Stroke ([place])   . Thyroid disease   . Transient cerebral ischemia 07/04/2012   a posterior circulation TIA in October 2009.    . 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 APPENDECTOMY    . CARDIAC CATHETERIZATION    . COLON RESECTION N/A 12/15/2014   Procedure: Laparoscopic partial  colectomy;  Surgeon: Autumn Messing III, MD;  Location: [facility] ORS;  Service: General;  Laterality: N/A;  . COLONOSCOPY WITH PROPOFOL N/A 12/12/2014   Procedure: COLONOSCOPY WITH PROPOFOL;  Surgeon: Gatha Mayer, MD;  Location: [facility] ENDOSCOPY;  Service: Endoscopy;  Laterality: N/A;  . CORONARY ARTERY BYPASS GRAFT  1991   grafts to LAD and RCA  . CORONARY ARTERY BYPASS GRAFT  1991   Family History  Problem Relation Age of Onset  . Pneumonia Mother   . Diabetes Mother   . Cancer Mother        colon  . Hypertension Mother   . Congestive Heart Failure Father   . Hypertension Father   . Coronary artery disease Father   . Heart attack Father   . Diabetes Father   . Heart failure Father   . Stroke Father   . Diabetes Sister   . Cancer Sister        breast  . Coronary artery disease Brother   . Heart disease Sister   . Diabetes Sister   . Kidney failure Sister   . Heart attack Sister    Social History   Occupational History  .  Retired   Social History Main Topics  . Smoking status: Former Smoker    Quit date: 12/03/1978  . Smokeless tobacco: Never Used  . Alcohol use No  . Drug use: No  . Sexual activity: No   Tobacco Counseling Counseling given: Not Answered   Activities of Daily Living In your present state of health, do you have any difficulty performing the following activities: 10/24/2016  Hearing? Y  Vision? N  Difficulty concentrating or making decisions? Y  Walking or climbing stairs? Y  Dressing or bathing? Y  Doing errands, shopping? Y  Preparing Food and eating ? Y  Using the Toilet? Y  In the past six months, have you accidently leaked urine? Y  Do you have problems with loss of bowel control? Y  Managing your Medications? Y  Managing your Finances? Y  Housekeeping or managing your Housekeeping? Y  Some recent data might be hidden    Immunizations and Health Maintenance Immunization History  Administered Date(s) Administered  . Influenza-Unspecified  12/22/2013, 11/09/2014, 12/05/2015  . PPD Test 01/03/2015  . Pneumococcal Polysaccharide-23 12/02/1993  . Tdap 01/08/2013  . Zoster 12/03/2007   Health Maintenance Due  Topic Date Due  . INFLUENZA VACCINE  09/18/2016    Patient Care Team: Blanchie Serve, MD as PCP - General (Internal Medicine) Zebedee Iba., MD as Referring Physician (Ophthalmology) Darlin Coco, MD as Consulting Physician (Cardiology) Garvin Fila, MD as Consulting Physician (Neurology) Festus Aloe, MD as Consulting Physician (Urology) Marilynne Halsted, MD as Referring Physician (Ophthalmology) Lavonna Monarch, MD as Consulting Physician (Dermatology) Gerarda Fraction, MD as Referring Physician (Ophthalmology) Mast, Man X, NP as  Nurse Practitioner (Internal Medicine)  Indicate any recent Medical Services you may have received from other than [facility] providers in the past year (date may be approximate).    Assessment:   This is a routine wellness examination for Jesse Burton.   Hearing/Vision screen No exam data present  Dietary issues and exercise activities discussed: Current Exercise Habits: The patient does not participate in regular exercise at present, Exercise limited by: neurologic condition(s)  Goals    None     Depression Screen PHQ 2/9 Scores 10/24/2016 02/10/2015 06/27/2014  PHQ - 2 Score 0 0 0    Fall Risk Fall Risk  10/24/2016 02/10/2015 06/27/2014  Falls in the past year? No Yes No  Number falls in past yr: - 1 -  Injury with Fall? - No -  Risk for fall due to : - History of fall(s) Impaired balance/gait  Follow up - Falls evaluation completed -    Cognitive Function: MMSE - Mini Mental State Exam 10/24/2016 03/01/2014  Not completed: Unable to complete -  Orientation to time - 1  Orientation to Place - 1  Registration - 3  Attention/ Calculation - 2  Recall - 0  Language- name 2 objects - 2  Language- repeat - 1  Language- follow 3 step command - 3  Language- read & follow  direction - 1  Write a sentence - 0  Copy design - 0  Total score - 14        Screening Tests Health Maintenance  Topic Date Due  . INFLUENZA VACCINE  09/18/2016  . PNA vac Low Risk Adult (2 of 2 - PCV13) 02/18/2017 (Originally 12/03/1994)  . TETANUS/TDAP  01/09/2023        Plan:    I have personally reviewed and addressed the Medicare Annual Wellness questionnaire and have noted the following in the patient's chart:  A. Medical and social history B. Use of alcohol, tobacco or illicit drugs  C. Current medications and supplements D. Functional ability and status E.  Nutritional status F.  Physical activity G. Advance directives H. List of other physicians I.  Hospitalizations, surgeries, and ER visits in previous 12 months J.  [place] to include hearing, vision, cognitive, depression L. Referrals and appointments - none  In addition, I am unable to review and discuss with incapacitated patient certain preventive protocols, quality metrics, and best practice recommendations. A written personalized care plan for preventive services as well as general preventive health recommendations were provided to patient.   See attached scanned questionnaire for additional information.   Signed,   Rich Reining, RN Nurse Health Advisor   Quick Notes   Health Maintenance: PNA 23 due, flu due when in stock at facility     Abnormal Screen: Unable to complete mental exam     Patient Concerns: none     Nurse Concerns: none

## 2016-10-25 ENCOUNTER — Encounter: Payer: Self-pay | Admitting: Nurse Practitioner

## 2016-10-25 ENCOUNTER — Non-Acute Institutional Stay (SKILLED_NURSING_FACILITY): Payer: Medicare Other | Admitting: Nurse Practitioner

## 2016-10-25 DIAGNOSIS — F0281 Dementia in other diseases classified elsewhere with behavioral disturbance: Secondary | ICD-10-CM

## 2016-10-25 DIAGNOSIS — D229 Melanocytic nevi, unspecified: Secondary | ICD-10-CM | POA: Insufficient documentation

## 2016-10-25 DIAGNOSIS — R233 Spontaneous ecchymoses: Secondary | ICD-10-CM | POA: Insufficient documentation

## 2016-10-25 DIAGNOSIS — G309 Alzheimer's disease, unspecified: Secondary | ICD-10-CM | POA: Diagnosis not present

## 2016-10-25 NOTE — Assessment & Plan Note (Signed)
Progressing, MMSE 17/30 11/16/14, not taking memory preserving meds, resides in memory care unit for care assistance.

## 2016-10-25 NOTE — Assessment & Plan Note (Signed)
there is injured sign from the base of the mole, slightly redness at the base, no ulceration, it should heal, protective dressing until healed.

## 2016-10-25 NOTE — Progress Notes (Signed)
Location:  [place] Room Number: 105 Place of Service:  SNF (31) Provider:  Jalaila Caradonna, [place]  NP   Blanchie Serve, MD  Patient Care Team: Blanchie Serve, MD as PCP - General (Internal Medicine) Zebedee Iba., MD as Referring Physician (Ophthalmology) Darlin Coco, MD as Consulting Physician (Cardiology) Garvin Fila, MD as Consulting Physician (Neurology) Festus Aloe, MD as Consulting Physician (Urology) Marilynne Halsted, MD as Referring Physician (Ophthalmology) Lavonna Monarch, MD as Consulting Physician (Dermatology) Gerarda Fraction, MD as Referring Physician (Ophthalmology) Neftali Thurow X, NP as Nurse Practitioner (Internal Medicine)  Extended Emergency Contact Information Primary Emergency Contact: Farr,Laura Address: [address] [place] [address] Johnnette Litter of [place] Phone: [phone] Mobile Phone: [phone] Relation: Daughter Secondary Emergency Contact: Genia Plants States of [place] Phone: [phone] Relation: Daughter  Code Status:  DNR Goals of care: Advanced Directive information Advanced Directives 10/25/2016  Does Patient Have a Medical Advance Directive? Yes  Type of Advance Directive Out of facility DNR (pink MOST or yellow form)  Does patient want to make changes to medical advance directive? No - Patient declined  Would patient like information on creating a medical advance directive? -  Pre-existing out of facility DNR order (yellow form or pink MOST form) Yellow form placed in chart (order not valid for inpatient use);Pink MOST form placed in chart (order not valid for inpatient use)     Chief Complaint  Patient presents with  . Acute Visit    Mole (Rt) lower back , some bleeding yesterday    HPI:  Pt is a 83 y.o. male seen today for an injured mole @ right  mid back, bleed and painful, no sign of infection. There are numerous moles in his back, no bleeding, ulceration,  or irregular shape noted. He denied pain, he takes Tylenol 650mg  bid for arthritic pain. He gets agitated sometime when he is assisted with ADL due to his advance dementia/lack understanding of surroundings.    Past Medical History:  Diagnosis Date  . Abnormality of gait 07/04/2012  . Actinic keratoses   . Arthritis   . Benign prostatic hyperplasia   . BPH (benign prostatic hyperplasia)   . CAD (coronary artery disease) of artery bypass graft 12/02/2013  . CAD (coronary artery disease) of bypass graft    a. s/p CABG 1991 without subsequent interventions.  . Chorioretinitis   . Chronic diastolic CHF (congestive heart failure) ([place])   . Colon cancer ([place])    a. admitted to [facility] 12/07/14 with profound iron deficiency anemia Hgb 4.5, melena, with new diagnosis of invasive adenocarcinoma of colon s/p lap R colectomy on 12/15/14.  . Dementia   . Diverticulitis   . Dizziness and giddiness 07/04/2012  . Edema   . Essential hypertension   . Hereditary and idiopathic peripheral neuropathy 07/04/2012   a history of chronic sensory polyneuropathy of undetermined origin with abnormality of gait. He is walking with a rolling walker. Last MRA of the head January 2013 shows no significant stenosis, MRA of the neck shows mild stenosis of the proximal left internal carotid artery.    Marland Kitchen History of atrial fibrillation   . HLD (hyperlipidemia)   . HTN (hypertension) 12/01/2013  . Hyperlipidemia   . Hypothyroidism 12/01/2013  . Hypothyroidism   . 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 Internal carotid artery stenosis    left  . Iron deficiency anemia    a.  11/2014 - Hgb 4.5 in setting of newly dx colon CA.   . Memory loss   . Mild left ventricular hypertrophy   . Neuromuscular disorder ([place])   . PAF (paroxysmal atrial fibrillation) ([place])    a. Not on anticoag due to bleeding and fall risk.  . Panuveitis   . Peripheral autonomic neuropathy in disorders classified elsewhere(337.1)  07/04/2012  . Prostatitis, acute    1991  . Retinitis   . Stroke ([place])   . Thyroid disease   . Transient cerebral ischemia 07/04/2012   a posterior circulation TIA in October 2009.    . 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 APPENDECTOMY    . CARDIAC CATHETERIZATION    . COLON RESECTION N/A 12/15/2014   Procedure: Laparoscopic partial colectomy;  Surgeon: Autumn Messing III, MD;  Location: [facility];  Service: General;  Laterality: N/A;  . COLONOSCOPY WITH PROPOFOL N/A 12/12/2014   Procedure: COLONOSCOPY WITH PROPOFOL;  Surgeon: Gatha Mayer, MD;  Location: [facility];  Service: Endoscopy;  Laterality: N/A;  . CORONARY ARTERY BYPASS GRAFT  1991   grafts to LAD and RCA  . CORONARY ARTERY BYPASS GRAFT  1991    Allergies  Allergen Reactions  . Sulfa Antibiotics Other (See Comments)    unknown  . Ativan [Lorazepam] Anxiety    Outpatient Encounter Prescriptions as of 10/25/2016  Medication Sig  . acetaminophen (TYLENOL) 325 MG tablet Take 650 mg by mouth 2 (two) times daily. Take 2 every 6 hours as needed for mild pain  . amiodarone (PACERONE) 200 MG tablet Take 1 tablet (200 mg total) by mouth daily.  . Artificial Tear Ointment (REFRESH LACRI-LUBE) OINT Apply to eye at bedtime.  . Calcium Carb-Cholecalciferol (CALCIUM-VITAMIN D) 600-400 MG-UNIT TABS Take 1 tablet by mouth 2 (two) times daily.  . carvedilol (COREG) 3.125 MG tablet Take 1 tablet (3.125 mg total) by mouth 2 (two) times daily with a meal.  . furosemide (LASIX) 20 MG tablet Take 10 mg by mouth daily.   Marland Kitchen levothyroxine (SYNTHROID, LEVOTHROID) 150 MCG tablet Take 150 mcg by mouth daily before breakfast.  . Melatonin 5 MG TABS Take by mouth. Give 1 tablet 1/2 hour before bedtime.  . polyethylene glycol (MIRALAX / GLYCOLAX) packet Take 17 g by mouth daily as needed.   . potassium chloride (K-DUR) 10 MEQ tablet Take 10 mEq by mouth daily.   . tamsulosin (FLOMAX) 0.4 MG  CAPS capsule Take 0.8 mg by mouth daily.   . traZODone (DESYREL) 150 MG tablet Take 75 mg by mouth at bedtime.   No facility-administered encounter medications on file as of 10/25/2016.    ROS was provided by assistance of staff Review of Systems  Constitutional: Negative for activity change, diaphoresis and fever.  Psychiatric/Behavioral: Positive for behavioral problems and confusion. Negative for agitation and sleep disturbance. The patient is not nervous/anxious.        Agitations when the patent is assisted with ADLS when he doesn't comprehend what's going on around him.     Immunization History  Administered Date(s) Administered  . Influenza-Unspecified 12/22/2013, 11/09/2014, 12/05/2015  . PPD Test 01/03/2015  . Pneumococcal Polysaccharide-23 12/02/1993  . Tdap 01/08/2013  . Zoster 12/03/2007   Pertinent  Health Maintenance Due  Topic Date Due  . INFLUENZA VACCINE  09/18/2016  . PNA vac Low Risk Adult (2 of 2 - PCV13) 02/18/2017 (Originally 12/03/1994)   Fall Risk  10/24/2016 02/10/2015 06/27/2014  Falls  in the past year? No Yes No  Number falls in past yr: - 1 -  Injury with Fall? - No -  Risk for fall due to : - History of fall(s) Impaired balance/gait  Follow up - Falls evaluation completed -   Functional Status Survey:    Vitals:   10/25/16 1542  BP: 132/80  Pulse: 60  Resp: 20  Temp: (!) 97.3 F (36.3 C)  SpO2: 97%  Weight: 180 lb 3.2 oz (81.7 kg)  Height: 6' (1.829 m)   Body mass index is 24.44 kg/m. Physical Exam  Constitutional: He appears well-developed and well-nourished. No distress.  Neurological: He is alert.  Oriented to self. Dementia.  Skin: Skin is warm and dry. He is not diaphoretic.   there is injured sign from the base of the mole, mild redness at the base, no active bleed, odorous drainage, or ulceration.   Psychiatric: He has a normal mood and affect.    Labs reviewed:  Recent Labs  03/19/16 07/30/16 09/02/16  NA 141 140 142  K 3.9  4.0 3.9  BUN 19 25* 22*  CREATININE 1.2 1.2 1.1    Recent Labs  01/16/16 03/19/16 07/30/16  AST 21 21 13*  ALT 13 16 12   ALKPHOS 77 83 57    Recent Labs  03/19/16 06/06/16 09/02/16  WBC 4.4 4.8 13.0  HGB 12.8* 13.0* 12.0*  HCT 39* 37* 35*  PLT 213 204 174   Lab Results  Component Value Date   TSH 3.01 09/03/2016   Lab Results  Component Value Date   HGBA1C 5.9 (H) 12/15/2014   Lab Results  Component Value Date   CHOL 95 12/26/2015   HDL 42 12/26/2015   LDLCALC 46 12/26/2015   TRIG 37 (A) 12/26/2015   CHOLHDL 2.6 12/07/2014    Significant Diagnostic Results in last 30 days:  No results found.  Assessment/Plan Bleeding skin mole  there is injured sign from the base of the mole, slightly redness at the base, no ulceration, it should heal, protective dressing until healed.   Alzheimer's disease Progressing, MMSE 17/30 11/16/14, not taking memory preserving meds, resides in memory care unit for care assistance.      Family/ staff Communication: plan of care reviewed with the patient and charge nurse.   Labs/tests ordered: none  Time spend 15 minutes.

## 2016-11-05 ENCOUNTER — Non-Acute Institutional Stay (SKILLED_NURSING_FACILITY): Payer: Medicare Other | Admitting: Internal Medicine

## 2016-11-05 ENCOUNTER — Encounter: Payer: Self-pay | Admitting: Internal Medicine

## 2016-11-05 DIAGNOSIS — I5032 Chronic diastolic (congestive) heart failure: Secondary | ICD-10-CM | POA: Diagnosis not present

## 2016-11-05 DIAGNOSIS — M1990 Unspecified osteoarthritis, unspecified site: Secondary | ICD-10-CM

## 2016-11-05 DIAGNOSIS — F5104 Psychophysiologic insomnia: Secondary | ICD-10-CM | POA: Diagnosis not present

## 2016-11-05 DIAGNOSIS — F0281 Dementia in other diseases classified elsewhere with behavioral disturbance: Secondary | ICD-10-CM | POA: Diagnosis not present

## 2016-11-05 DIAGNOSIS — N401 Enlarged prostate with lower urinary tract symptoms: Secondary | ICD-10-CM | POA: Diagnosis not present

## 2016-11-05 DIAGNOSIS — K5901 Slow transit constipation: Secondary | ICD-10-CM

## 2016-11-05 DIAGNOSIS — G309 Alzheimer's disease, unspecified: Secondary | ICD-10-CM | POA: Diagnosis not present

## 2016-11-05 DIAGNOSIS — R350 Frequency of micturition: Secondary | ICD-10-CM | POA: Diagnosis not present

## 2016-11-05 NOTE — Progress Notes (Signed)
Location:  [place] Room Number: 105 Place of Service:  SNF [phone]) Provider:  Blanchie Serve MD  Blanchie Serve, MD  Patient Care Team: Blanchie Serve, MD as PCP - General (Internal Medicine) Zebedee Iba., MD as Referring Physician (Ophthalmology) Darlin Coco, MD as Consulting Physician (Cardiology) Garvin Fila, MD as Consulting Physician (Neurology) Festus Aloe, MD as Consulting Physician (Urology) Marilynne Halsted, MD as Referring Physician (Ophthalmology) Lavonna Monarch, MD as Consulting Physician (Dermatology) Gerarda Fraction, MD as Referring Physician (Ophthalmology) Mast, Man X, NP as Nurse Practitioner (Internal Medicine)  Extended Emergency Contact Information Primary Emergency Contact: Farr,Laura Address: [address] [place] [address] Johnnette Litter of [place] Phone: [phone] Mobile Phone: [phone] Relation: Daughter Secondary Emergency Contact: Genia Plants States of [place] Phone: [phone] Relation: Daughter  Code Status:  DNR Goals of care: Advanced Directive information Advanced Directives 11/05/2016  Does Patient Have a Medical Advance Directive? Yes  Type of Advance Directive Out of facility DNR (pink MOST or yellow form)  Does patient want to make changes to medical advance directive? No - Patient declined  Would patient like information on creating a medical advance directive? -  Pre-existing out of facility DNR order (yellow form or pink MOST form) Yellow form placed in chart (order not valid for inpatient use);Pink MOST form placed in chart (order not valid for inpatient use)     Chief Complaint  Patient presents with  . Medical Management of Chronic Issues    Routine Visit     HPI:  Pt is a 83 y.o. male seen today for medical management of chronic diseases. He has advanced dementia. He is seen in his room today. He gets around on his wheelchair. He denies any  concern this visit. No new concern from nursing.   Dementia- continues to receive supportive care, needs redirection  Insomnia- currently on trazodone 75 mg daily and melatonin 5 mg daily, per nursing restless at night with poor sleep cycle  OA- denies pain, on tylenol 650 mg bid and calcium with vit d supplement  Constipation- had bowel movement this am. On miralax daily as needed  BPH- denies urinary complaint, on flomax  Chronic diastolic CHF- taking coreg 3.125 mg bid, lasix 10 mg daily and kcl 10 meq daily    Past Medical History:  Diagnosis Date  . Abnormality of gait 07/04/2012  . Actinic keratoses   . Arthritis   . Benign prostatic hyperplasia   . BPH (benign prostatic hyperplasia)   . CAD (coronary artery disease) of artery bypass graft 12/02/2013  . CAD (coronary artery disease) of bypass graft    a. s/p CABG 1991 without subsequent interventions.  . Chorioretinitis   . Chronic diastolic CHF (congestive heart failure) ([place])   . Colon cancer ([place])    a. admitted to [facility] 12/07/14 with profound iron deficiency anemia Hgb 4.5, melena, with new diagnosis of invasive adenocarcinoma of colon s/p lap R colectomy on 12/15/14.  . Dementia   . Diverticulitis   . Dizziness and giddiness 07/04/2012  . Edema   . Essential hypertension   . Hereditary and idiopathic peripheral neuropathy 07/04/2012   a history of chronic sensory polyneuropathy of undetermined origin with abnormality of gait. He is walking with a rolling walker. Last MRA of the head January 2013 shows no significant stenosis, MRA of the neck shows mild stenosis of the proximal left internal carotid artery.    Marland Kitchen  History of atrial fibrillation   . HLD (hyperlipidemia)   . HTN (hypertension) 12/01/2013  . Hyperlipidemia   . Hypothyroidism 12/01/2013  . Hypothyroidism   . 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 Internal carotid artery stenosis    left  . Iron deficiency anemia    a. 11/2014 - Hgb 4.5 in  setting of newly dx colon CA.   . Memory loss   . Mild left ventricular hypertrophy   . Neuromuscular disorder ([place])   . PAF (paroxysmal atrial fibrillation) ([place])    a. Not on anticoag due to bleeding and fall risk.  . Panuveitis   . Peripheral autonomic neuropathy in disorders classified elsewhere(337.1) 07/04/2012  . Prostatitis, acute    1991  . Retinitis   . Stroke ([place])   . Thyroid disease   . Transient cerebral ischemia 07/04/2012   a posterior circulation TIA in October 2009.    . 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 APPENDECTOMY    . CARDIAC CATHETERIZATION    . COLON RESECTION N/A 12/15/2014   Procedure: Laparoscopic partial colectomy;  Surgeon: Autumn Messing III, MD;  Location: [facility];  Service: General;  Laterality: N/A;  . COLONOSCOPY WITH PROPOFOL N/A 12/12/2014   Procedure: COLONOSCOPY WITH PROPOFOL;  Surgeon: Gatha Mayer, MD;  Location: [facility];  Service: Endoscopy;  Laterality: N/A;  . CORONARY ARTERY BYPASS GRAFT  1991   grafts to LAD and RCA  . CORONARY ARTERY BYPASS GRAFT  1991    Allergies  Allergen Reactions  . Sulfa Antibiotics Other (See Comments)    unknown  . Ativan [Lorazepam] Anxiety    Outpatient Encounter Prescriptions as of 11/05/2016  Medication Sig  . acetaminophen (TYLENOL) 325 MG tablet Take 650 mg by mouth 2 (two) times daily. Take 2 every 6 hours as needed for mild pain  . amiodarone (PACERONE) 200 MG tablet Take 1 tablet (200 mg total) by mouth daily.  . Artificial Tear Ointment (REFRESH LACRI-LUBE) OINT Apply to eye at bedtime. Both eyes.  . Calcium Carb-Cholecalciferol (CALCIUM-VITAMIN D) 600-400 MG-UNIT TABS Take 1 tablet by mouth 2 (two) times daily.  . carvedilol (COREG) 3.125 MG tablet Take 1 tablet (3.125 mg total) by mouth 2 (two) times daily with a meal.  . furosemide (LASIX) 20 MG tablet Take 10 mg by mouth daily.   Marland Kitchen levothyroxine (SYNTHROID, LEVOTHROID) 150 MCG  tablet Take 150 mcg by mouth daily before breakfast.  . Melatonin 5 MG TABS Take by mouth. Give 1 tablet 1/2 hour before bedtime.  . polyethylene glycol (MIRALAX / GLYCOLAX) packet Take 17 g by mouth daily as needed.   . potassium chloride (K-DUR) 10 MEQ tablet Take 10 mEq by mouth daily.   . tamsulosin (FLOMAX) 0.4 MG CAPS capsule Take 0.8 mg by mouth daily.   . traZODone (DESYREL) 150 MG tablet Take 75 mg by mouth at bedtime.   No facility-administered encounter medications on file as of 11/05/2016.     Review of Systems  Unable to perform ROS: Dementia  Constitutional: Negative for appetite change, chills, fatigue and fever.  HENT: Positive for hearing loss and trouble swallowing. Negative for congestion and mouth sores.        On thickened liquid  Eyes:       Wears glasses  Respiratory: Negative for cough and shortness of breath.   Cardiovascular: Negative for chest pain and palpitations.  Gastrointestinal: Negative for abdominal pain, constipation, diarrhea, nausea  and vomiting.  Genitourinary: Negative for dysuria.       Has urinary incontinence  Musculoskeletal: Positive for arthralgias and gait problem.       On wheelchair, no falls reported.   Skin: Negative for rash and wound.       Easy bruising  Neurological: Negative for dizziness, syncope, light-headedness and headaches.  Hematological: Bruises/bleeds easily.  Psychiatric/Behavioral: Positive for confusion.    Immunization History  Administered Date(s) Administered  . Influenza-Unspecified 12/22/2013, 11/09/2014, 12/05/2015  . PPD Test 01/03/2015  . Pneumococcal Polysaccharide-23 12/02/1993  . Tdap 01/08/2013  . Zoster 12/03/2007   Pertinent  Health Maintenance Due  Topic Date Due  . INFLUENZA VACCINE  11/18/2016 (Originally 09/18/2016)  . PNA vac Low Risk Adult (2 of 2 - PCV13) 02/18/2017 (Originally 12/03/1994)   Fall Risk  10/24/2016 02/10/2015 06/27/2014  Falls in the past year? No Yes No  Number falls in past  yr: - 1 -  Injury with Fall? - No -  Risk for fall due to : - History of fall(s) Impaired balance/gait  Follow up - Falls evaluation completed -   Functional Status Survey:    Vitals:   11/05/16 1410  BP: 106/68  Pulse: 60  Resp: 20  Temp: 98 F (36.7 C)  TempSrc: Oral  SpO2: 94%  Weight: 180 lb 3.2 oz (81.7 kg)  Height: 6' (1.829 m)   Body mass index is 24.44 kg/m.   Wt Readings from Last 3 Encounters:  11/05/16 180 lb 3.2 oz (81.7 kg)  10/25/16 180 lb 3.2 oz (81.7 kg)  10/24/16 181 lb (82.1 kg)   Physical Exam  Constitutional: He appears well-developed and well-nourished. No distress.  HENT:  Head: Normocephalic and atraumatic.  Mouth/Throat: Oropharynx is clear and moist.  Eyes: Pupils are equal, round, and reactive to light. Conjunctivae are normal.  Glasses   Neck: Normal range of motion. Neck supple. No thyromegaly present.  Cardiovascular: Normal rate and regular rhythm.   Pulmonary/Chest: Effort normal and breath sounds normal. No respiratory distress. He has no wheezes. He has no rales.  Abdominal: Soft. Bowel sounds are normal. He exhibits no distension. There is no tenderness. There is no rebound.  Musculoskeletal: Normal range of motion. He exhibits no edema.  wheelchair bound and has to be wheeled  Lymphadenopathy:    He has no cervical adenopathy.  Neurological: He is alert.  Oriented only to self  Skin: Skin is warm and dry. No rash noted. He is not diaphoretic.  Bruising noted  Psychiatric: He has a normal mood and affect.    Labs reviewed:  Recent Labs  03/19/16 07/30/16 09/02/16  NA 141 140 142  K 3.9 4.0 3.9  BUN 19 25* 22*  CREATININE 1.2 1.2 1.1    Recent Labs  01/16/16 03/19/16 07/30/16  AST 21 21 13*  ALT 13 16 12   ALKPHOS 77 83 57    Recent Labs  03/19/16 06/06/16 09/02/16  WBC 4.4 4.8 13.0  HGB 12.8* 13.0* 12.0*  HCT 39* 37* 35*  PLT 213 204 174   Lab Results  Component Value Date   TSH 3.01 09/03/2016   Lab  Results  Component Value Date   HGBA1C 5.9 (H) 12/15/2014   Lab Results  Component Value Date   CHOL 95 12/26/2015   HDL 42 12/26/2015   LDLCALC 46 12/26/2015   TRIG 37 (A) 12/26/2015   CHOLHDL 2.6 12/07/2014    Significant Diagnostic Results in last 30 days:  No results  found.  Assessment/Plan  Dementia Advanced, in memory care unit, receiving supportive care, monitor  Insomnia currently on trazodone 75 mg daily and melatonin 5 mg daily, per nursing restless at night with poor sleep cycle. Increase trazodone to 100 mg daily. This should help with his behavior with dementia as well.   OA continue tylenol 650 mg bid and calcium with vit d supplement  Constipation Monitor and provide miralax on need basis  BPH on flomax and has UI. Monitor, perineal care  Chronic diastolic CHF Stable. No leg edema. Continue coreg 3.125 mg bid/ d/c lasix and kcl supplement. Monitor clinically.    Family/ staff Communication: reviewed care plan with patient and charge nurse.    Labs/tests ordered:  none   Blanchie Serve, MD Internal Medicine [facility] [address] [place], [place] [address] Cell Phone (Monday-Friday 8 am - 5 pm): [phone] On Call: [phone] and follow prompts after 5 pm and on weekends Office Phone: [phone] Office Fax: [phone]

## 2016-11-13 DIAGNOSIS — G459 Transient cerebral ischemic attack, unspecified: Secondary | ICD-10-CM | POA: Diagnosis not present

## 2016-11-13 DIAGNOSIS — H3093 Unspecified chorioretinal inflammation, bilateral: Secondary | ICD-10-CM | POA: Diagnosis not present

## 2016-11-13 DIAGNOSIS — M25569 Pain in unspecified knee: Secondary | ICD-10-CM | POA: Diagnosis not present

## 2016-11-13 DIAGNOSIS — I4891 Unspecified atrial fibrillation: Secondary | ICD-10-CM | POA: Diagnosis not present

## 2016-11-13 DIAGNOSIS — M6281 Muscle weakness (generalized): Secondary | ICD-10-CM | POA: Diagnosis not present

## 2016-11-13 DIAGNOSIS — R609 Edema, unspecified: Secondary | ICD-10-CM | POA: Diagnosis not present

## 2016-11-13 DIAGNOSIS — Z9181 History of falling: Secondary | ICD-10-CM | POA: Diagnosis not present

## 2016-11-13 DIAGNOSIS — G9009 Other idiopathic peripheral autonomic neuropathy: Secondary | ICD-10-CM | POA: Diagnosis not present

## 2016-11-13 DIAGNOSIS — I10 Essential (primary) hypertension: Secondary | ICD-10-CM | POA: Diagnosis not present

## 2016-11-13 DIAGNOSIS — L570 Actinic keratosis: Secondary | ICD-10-CM | POA: Diagnosis not present

## 2016-11-13 DIAGNOSIS — R278 Other lack of coordination: Secondary | ICD-10-CM | POA: Diagnosis not present

## 2016-11-13 DIAGNOSIS — R262 Difficulty in walking, not elsewhere classified: Secondary | ICD-10-CM | POA: Diagnosis not present

## 2016-11-13 DIAGNOSIS — R2681 Unsteadiness on feet: Secondary | ICD-10-CM | POA: Diagnosis not present

## 2016-11-15 DIAGNOSIS — H3093 Unspecified chorioretinal inflammation, bilateral: Secondary | ICD-10-CM | POA: Diagnosis not present

## 2016-11-15 DIAGNOSIS — I4891 Unspecified atrial fibrillation: Secondary | ICD-10-CM | POA: Diagnosis not present

## 2016-11-15 DIAGNOSIS — I10 Essential (primary) hypertension: Secondary | ICD-10-CM | POA: Diagnosis not present

## 2016-11-15 DIAGNOSIS — R278 Other lack of coordination: Secondary | ICD-10-CM | POA: Diagnosis not present

## 2016-11-15 DIAGNOSIS — M25569 Pain in unspecified knee: Secondary | ICD-10-CM | POA: Diagnosis not present

## 2016-11-15 DIAGNOSIS — R609 Edema, unspecified: Secondary | ICD-10-CM | POA: Diagnosis not present

## 2016-11-15 DIAGNOSIS — M6281 Muscle weakness (generalized): Secondary | ICD-10-CM | POA: Diagnosis not present

## 2016-11-15 DIAGNOSIS — Z9181 History of falling: Secondary | ICD-10-CM | POA: Diagnosis not present

## 2016-11-15 DIAGNOSIS — R2681 Unsteadiness on feet: Secondary | ICD-10-CM | POA: Diagnosis not present

## 2016-11-15 DIAGNOSIS — G459 Transient cerebral ischemic attack, unspecified: Secondary | ICD-10-CM | POA: Diagnosis not present

## 2016-11-15 DIAGNOSIS — R262 Difficulty in walking, not elsewhere classified: Secondary | ICD-10-CM | POA: Diagnosis not present

## 2016-11-15 DIAGNOSIS — L570 Actinic keratosis: Secondary | ICD-10-CM | POA: Diagnosis not present

## 2016-11-15 DIAGNOSIS — G9009 Other idiopathic peripheral autonomic neuropathy: Secondary | ICD-10-CM | POA: Diagnosis not present

## 2016-11-20 DIAGNOSIS — R609 Edema, unspecified: Secondary | ICD-10-CM | POA: Diagnosis not present

## 2016-11-20 DIAGNOSIS — I10 Essential (primary) hypertension: Secondary | ICD-10-CM | POA: Diagnosis not present

## 2016-11-20 DIAGNOSIS — R2681 Unsteadiness on feet: Secondary | ICD-10-CM | POA: Diagnosis not present

## 2016-11-20 DIAGNOSIS — R262 Difficulty in walking, not elsewhere classified: Secondary | ICD-10-CM | POA: Diagnosis not present

## 2016-11-20 DIAGNOSIS — G459 Transient cerebral ischemic attack, unspecified: Secondary | ICD-10-CM | POA: Diagnosis not present

## 2016-11-20 DIAGNOSIS — M25569 Pain in unspecified knee: Secondary | ICD-10-CM | POA: Diagnosis not present

## 2016-11-20 DIAGNOSIS — H3093 Unspecified chorioretinal inflammation, bilateral: Secondary | ICD-10-CM | POA: Diagnosis not present

## 2016-11-20 DIAGNOSIS — M6281 Muscle weakness (generalized): Secondary | ICD-10-CM | POA: Diagnosis not present

## 2016-11-20 DIAGNOSIS — R531 Weakness: Secondary | ICD-10-CM | POA: Diagnosis not present

## 2016-11-20 DIAGNOSIS — L570 Actinic keratosis: Secondary | ICD-10-CM | POA: Diagnosis not present

## 2016-11-20 DIAGNOSIS — I4891 Unspecified atrial fibrillation: Secondary | ICD-10-CM | POA: Diagnosis not present

## 2016-11-20 DIAGNOSIS — R278 Other lack of coordination: Secondary | ICD-10-CM | POA: Diagnosis not present

## 2016-11-20 DIAGNOSIS — G9009 Other idiopathic peripheral autonomic neuropathy: Secondary | ICD-10-CM | POA: Diagnosis not present

## 2016-11-22 DIAGNOSIS — G459 Transient cerebral ischemic attack, unspecified: Secondary | ICD-10-CM | POA: Diagnosis not present

## 2016-11-22 DIAGNOSIS — R609 Edema, unspecified: Secondary | ICD-10-CM | POA: Diagnosis not present

## 2016-11-22 DIAGNOSIS — M25569 Pain in unspecified knee: Secondary | ICD-10-CM | POA: Diagnosis not present

## 2016-11-22 DIAGNOSIS — G9009 Other idiopathic peripheral autonomic neuropathy: Secondary | ICD-10-CM | POA: Diagnosis not present

## 2016-11-22 DIAGNOSIS — I10 Essential (primary) hypertension: Secondary | ICD-10-CM | POA: Diagnosis not present

## 2016-11-22 DIAGNOSIS — R262 Difficulty in walking, not elsewhere classified: Secondary | ICD-10-CM | POA: Diagnosis not present

## 2016-11-22 DIAGNOSIS — M6281 Muscle weakness (generalized): Secondary | ICD-10-CM | POA: Diagnosis not present

## 2016-11-22 DIAGNOSIS — R2681 Unsteadiness on feet: Secondary | ICD-10-CM | POA: Diagnosis not present

## 2016-11-22 DIAGNOSIS — I4891 Unspecified atrial fibrillation: Secondary | ICD-10-CM | POA: Diagnosis not present

## 2016-11-22 DIAGNOSIS — H3093 Unspecified chorioretinal inflammation, bilateral: Secondary | ICD-10-CM | POA: Diagnosis not present

## 2016-11-22 DIAGNOSIS — L570 Actinic keratosis: Secondary | ICD-10-CM | POA: Diagnosis not present

## 2016-11-22 DIAGNOSIS — R278 Other lack of coordination: Secondary | ICD-10-CM | POA: Diagnosis not present

## 2016-11-27 DIAGNOSIS — I4891 Unspecified atrial fibrillation: Secondary | ICD-10-CM | POA: Diagnosis not present

## 2016-11-27 DIAGNOSIS — R609 Edema, unspecified: Secondary | ICD-10-CM | POA: Diagnosis not present

## 2016-11-27 DIAGNOSIS — M6281 Muscle weakness (generalized): Secondary | ICD-10-CM | POA: Diagnosis not present

## 2016-11-27 DIAGNOSIS — H3093 Unspecified chorioretinal inflammation, bilateral: Secondary | ICD-10-CM | POA: Diagnosis not present

## 2016-11-27 DIAGNOSIS — M25569 Pain in unspecified knee: Secondary | ICD-10-CM | POA: Diagnosis not present

## 2016-11-27 DIAGNOSIS — L570 Actinic keratosis: Secondary | ICD-10-CM | POA: Diagnosis not present

## 2016-11-27 DIAGNOSIS — G459 Transient cerebral ischemic attack, unspecified: Secondary | ICD-10-CM | POA: Diagnosis not present

## 2016-11-27 DIAGNOSIS — R2681 Unsteadiness on feet: Secondary | ICD-10-CM | POA: Diagnosis not present

## 2016-11-27 DIAGNOSIS — G9009 Other idiopathic peripheral autonomic neuropathy: Secondary | ICD-10-CM | POA: Diagnosis not present

## 2016-11-27 DIAGNOSIS — I10 Essential (primary) hypertension: Secondary | ICD-10-CM | POA: Diagnosis not present

## 2016-11-27 DIAGNOSIS — R262 Difficulty in walking, not elsewhere classified: Secondary | ICD-10-CM | POA: Diagnosis not present

## 2016-11-27 DIAGNOSIS — R278 Other lack of coordination: Secondary | ICD-10-CM | POA: Diagnosis not present

## 2016-11-29 DIAGNOSIS — M6281 Muscle weakness (generalized): Secondary | ICD-10-CM | POA: Diagnosis not present

## 2016-11-29 DIAGNOSIS — L570 Actinic keratosis: Secondary | ICD-10-CM | POA: Diagnosis not present

## 2016-11-29 DIAGNOSIS — M25569 Pain in unspecified knee: Secondary | ICD-10-CM | POA: Diagnosis not present

## 2016-11-29 DIAGNOSIS — G9009 Other idiopathic peripheral autonomic neuropathy: Secondary | ICD-10-CM | POA: Diagnosis not present

## 2016-11-29 DIAGNOSIS — H3093 Unspecified chorioretinal inflammation, bilateral: Secondary | ICD-10-CM | POA: Diagnosis not present

## 2016-11-29 DIAGNOSIS — I10 Essential (primary) hypertension: Secondary | ICD-10-CM | POA: Diagnosis not present

## 2016-11-29 DIAGNOSIS — R278 Other lack of coordination: Secondary | ICD-10-CM | POA: Diagnosis not present

## 2016-11-29 DIAGNOSIS — R2681 Unsteadiness on feet: Secondary | ICD-10-CM | POA: Diagnosis not present

## 2016-11-29 DIAGNOSIS — I4891 Unspecified atrial fibrillation: Secondary | ICD-10-CM | POA: Diagnosis not present

## 2016-11-29 DIAGNOSIS — R609 Edema, unspecified: Secondary | ICD-10-CM | POA: Diagnosis not present

## 2016-11-29 DIAGNOSIS — R262 Difficulty in walking, not elsewhere classified: Secondary | ICD-10-CM | POA: Diagnosis not present

## 2016-11-29 DIAGNOSIS — G459 Transient cerebral ischemic attack, unspecified: Secondary | ICD-10-CM | POA: Diagnosis not present

## 2016-12-03 ENCOUNTER — Encounter: Payer: Self-pay | Admitting: Nurse Practitioner

## 2016-12-03 ENCOUNTER — Non-Acute Institutional Stay (SKILLED_NURSING_FACILITY): Payer: Medicare Other | Admitting: Nurse Practitioner

## 2016-12-03 DIAGNOSIS — G309 Alzheimer's disease, unspecified: Secondary | ICD-10-CM

## 2016-12-03 DIAGNOSIS — I480 Paroxysmal atrial fibrillation: Secondary | ICD-10-CM

## 2016-12-03 DIAGNOSIS — F5104 Psychophysiologic insomnia: Secondary | ICD-10-CM

## 2016-12-03 DIAGNOSIS — E034 Atrophy of thyroid (acquired): Secondary | ICD-10-CM | POA: Diagnosis not present

## 2016-12-03 DIAGNOSIS — F0281 Dementia in other diseases classified elsewhere with behavioral disturbance: Secondary | ICD-10-CM | POA: Diagnosis not present

## 2016-12-03 DIAGNOSIS — S51011A Laceration without foreign body of right elbow, initial encounter: Secondary | ICD-10-CM | POA: Diagnosis not present

## 2016-12-03 DIAGNOSIS — R350 Frequency of micturition: Secondary | ICD-10-CM | POA: Diagnosis not present

## 2016-12-03 DIAGNOSIS — N401 Enlarged prostate with lower urinary tract symptoms: Secondary | ICD-10-CM | POA: Diagnosis not present

## 2016-12-03 NOTE — Progress Notes (Signed)
Location:  [facility] Room Number: 105 Place of Service:  SNF (31) Provider:  Lary Eckardt, Manxie  NP  Blanchie Serve, MD  Patient Care Team: Blanchie Serve, MD as PCP - General (Internal Medicine) Zebedee Iba., MD as Referring Physician (Ophthalmology) Darlin Coco, MD as Consulting Physician (Cardiology) Garvin Fila, MD as Consulting Physician (Neurology) Festus Aloe, MD as Consulting Physician (Urology) Marilynne Halsted, MD as Referring Physician (Ophthalmology) Lavonna Monarch, MD as Consulting Physician (Dermatology) Gerarda Fraction, MD as Referring Physician (Ophthalmology) Hermen Mario X, NP as Nurse Practitioner (Internal Medicine)  Extended Emergency Contact Information Primary Emergency Contact: Farr,Laura Address: [address] [place] [address] Johnnette Litter of [place] Phone: [phone] Mobile Phone: [phone] Relation: Daughter Secondary Emergency Contact: Genia Plants States of [place] Phone: [phone] Relation: Daughter  Code Status:  DNR Goals of care: Advanced Directive information Advanced Directives 12/03/2016  Does Patient Have a Medical Advance Directive? Yes  Type of Advance Directive Out of facility DNR (pink MOST or yellow form)  Does patient want to make changes to medical advance directive? No - Patient declined  Would patient like information on creating a medical advance directive? -  Pre-existing out of facility DNR order (yellow form or pink MOST form) -     Chief Complaint  Patient presents with  . Medical Management of Chronic Issues    HPI:  Pt is a 83 y.o. male seen today for medical management of chronic diseases.     The patient has history of dementia, he is lack of safety awareness, resides in memory care unit for close supervision for safety and ADL assistance, he is risk for falling, last fall was 12/02/16 without apparent injury, he was found lying on  right side on floor in front of commode in  Bathroom. He is non ambulatory, transfer with one person assistance. He was also noted to have a skin tear RLE with steri-strips closure w/o s/s of infection, not certain of the mechanism of injury since the patient is unable to give detailed HPI related to his dementia. He sleeps well while on Trazodone 75mg  hs, no urinary retention, taking Tamsulosin, hypothyroidism taking Levothyroxine 112mcg po daily, last TSH 3.01 09/03/16. Afib, heart rate is in control, taking Carvedilol 3.125mg  bid, Amiodarone 200mg .  Past Medical History:  Diagnosis Date  . Abnormality of gait 07/04/2012  . Actinic keratoses   . Arthritis   . Benign prostatic hyperplasia   . BPH (benign prostatic hyperplasia)   . CAD (coronary artery disease) of artery bypass graft 12/02/2013  . CAD (coronary artery disease) of bypass graft    a. s/p CABG 1991 without subsequent interventions.  . Chorioretinitis   . Chronic diastolic CHF (congestive heart failure) ([place])   . Colon cancer ([place])    a. admitted to [facility] 12/07/14 with profound iron deficiency anemia Hgb 4.5, melena, with new diagnosis of invasive adenocarcinoma of colon s/p lap R colectomy on 12/15/14.  . Dementia   . Diverticulitis   . Dizziness and giddiness 07/04/2012  . Edema   . Essential hypertension   . Hereditary and idiopathic peripheral neuropathy 07/04/2012   a history of chronic sensory polyneuropathy of undetermined origin with abnormality of gait. He is walking with a rolling walker. Last MRA of the head January 2013 shows no significant stenosis, MRA of the neck shows mild stenosis of the proximal left internal carotid artery.    Marland Kitchen History  of atrial fibrillation   . HLD (hyperlipidemia)   . HTN (hypertension) 12/01/2013  . Hyperlipidemia   . Hypothyroidism 12/01/2013  . Hypothyroidism   . 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 Internal carotid artery stenosis    left  . Iron deficiency anemia    a.  11/2014 - Hgb 4.5 in setting of newly dx colon CA.   . Memory loss   . Mild left ventricular hypertrophy   . Neuromuscular disorder ([place])   . PAF (paroxysmal atrial fibrillation) ([place])    a. Not on anticoag due to bleeding and fall risk.  . Panuveitis   . Peripheral autonomic neuropathy in disorders classified elsewhere(337.1) 07/04/2012  . Prostatitis, acute    1991  . Retinitis   . Stroke ([place])   . Thyroid disease   . Transient cerebral ischemia 07/04/2012   a posterior circulation TIA in October 2009.    . 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 APPENDECTOMY    . CARDIAC CATHETERIZATION    . COLON RESECTION N/A 12/15/2014   Procedure: Laparoscopic partial colectomy;  Surgeon: Autumn Messing III, MD;  Location: [facility] ORS;  Service: General;  Laterality: N/A;  . COLONOSCOPY WITH PROPOFOL N/A 12/12/2014   Procedure: COLONOSCOPY WITH PROPOFOL;  Surgeon: Gatha Mayer, MD;  Location: [facility] ENDOSCOPY;  Service: Endoscopy;  Laterality: N/A;  . CORONARY ARTERY BYPASS GRAFT  1991   grafts to LAD and RCA  . CORONARY ARTERY BYPASS GRAFT  1991    Allergies  Allergen Reactions  . Sulfa Antibiotics Other (See Comments)    unknown  . Ativan [Lorazepam] Anxiety    Outpatient Encounter Prescriptions as of 12/03/2016  Medication Sig  . acetaminophen (TYLENOL) 325 MG tablet Take 650 mg by mouth 2 (two) times daily. Take 2 every 6 hours as needed for mild pain  . amiodarone (PACERONE) 200 MG tablet Take 1 tablet (200 mg total) by mouth daily.  . Artificial Tear Ointment (REFRESH LACRI-LUBE) OINT Apply to eye at bedtime. Both eyes.  . Calcium Carb-Cholecalciferol (CALCIUM-VITAMIN D) 600-400 MG-UNIT TABS Take 1 tablet by mouth 2 (two) times daily.  . carvedilol (COREG) 3.125 MG tablet Take 1 tablet (3.125 mg total) by mouth 2 (two) times daily with a meal.  . levothyroxine (SYNTHROID, LEVOTHROID) 150 MCG tablet Take 150 mcg by mouth daily before  breakfast.  . Melatonin 5 MG TABS Take by mouth. Give 1 tablet 1/2 hour before bedtime.  . polyethylene glycol (MIRALAX / GLYCOLAX) packet Take 17 g by mouth daily as needed.   . tamsulosin (FLOMAX) 0.4 MG CAPS capsule Take 0.8 mg by mouth daily.   . traZODone (DESYREL) 150 MG tablet Take 75 mg by mouth at bedtime.  . [DISCONTINUED] furosemide (LASIX) 20 MG tablet Take 10 mg by mouth daily.   . [DISCONTINUED] potassium chloride (K-DUR) 10 MEQ tablet Take 10 mEq by mouth daily.    No facility-administered encounter medications on file as of 12/03/2016.     Review of Systems  Constitutional: Negative for activity change, appetite change, chills, diaphoresis, fatigue, fever and unexpected weight change.  Eyes: Negative for visual disturbance.  Respiratory: Negative for cough, choking and shortness of breath.   Cardiovascular: Negative for chest pain, palpitations and leg swelling.  Endocrine: Negative for cold intolerance and heat intolerance.  Genitourinary: Negative for dysuria.  Musculoskeletal: Positive for gait problem.       Fall 12/02/16  Skin: Positive for wound. Negative  for color change, pallor and rash.  Neurological: Negative for speech difficulty, weakness and headaches.  Psychiatric/Behavioral: Positive for confusion. Negative for agitation, behavioral problems, hallucinations and sleep disturbance.    Immunization History  Administered Date(s) Administered  . Influenza-Unspecified 12/22/2013, 11/09/2014, 12/05/2015  . PPD Test 01/03/2015  . Pneumococcal Polysaccharide-23 12/02/1993  . Tdap 01/08/2013  . Zoster 12/03/2007   Pertinent  Health Maintenance Due  Topic Date Due  . INFLUENZA VACCINE  09/18/2016  . PNA vac Low Risk Adult (2 of 2 - PCV13) 02/18/2017 (Originally 12/03/1994)   Fall Risk  10/24/2016 02/10/2015 06/27/2014  Falls in the past year? No Yes No  Number falls in past yr: - 1 -  Injury with Fall? - No -  Risk for fall due to : - History of fall(s)  Impaired balance/gait  Follow up - Falls evaluation completed -   Functional Status Survey:    Vitals:   12/03/16 1245  BP: (!) 150/70  Pulse: 72  Resp: 20  Temp: 97.6 F (36.4 C)  SpO2: 96%  Weight: 181 lb 9.6 oz (82.4 kg)  Height: 6' (1.829 m)   Body mass index is 24.63 kg/m. Physical Exam  Constitutional: He appears well-developed and well-nourished. No distress.  HENT:  Head: Normocephalic and atraumatic.  Eyes: Pupils are equal, round, and reactive to light. Conjunctivae and EOM are normal.  Neck: Normal range of motion. Neck supple. No JVD present. No thyromegaly present.  Cardiovascular: Normal rate.   No murmur heard. Pulmonary/Chest: Effort normal and breath sounds normal. No respiratory distress. He has no wheezes.  Abdominal: Soft. Bowel sounds are normal.  Musculoskeletal: Normal range of motion. He exhibits no edema or tenderness.  One assist for transfer, w/c for mobility.   Neurological: He is alert. He exhibits normal muscle tone. Coordination normal.  Oriented to self  Skin: Skin is warm and dry. No rash noted. He is not diaphoretic. No erythema. No pallor.  Skin tear RLE, steri strips x3 closure, no sign of infection  Psychiatric: He has a normal mood and affect. His behavior is normal.    Labs reviewed:  Recent Labs  03/19/16 07/30/16 09/02/16  NA 141 140 142  K 3.9 4.0 3.9  BUN 19 25* 22*  CREATININE 1.2 1.2 1.1    Recent Labs  01/16/16 03/19/16 07/30/16  AST 21 21 13*  ALT 13 16 12   ALKPHOS 77 83 57    Recent Labs  03/19/16 06/06/16 09/02/16  WBC 4.4 4.8 13.0  HGB 12.8* 13.0* 12.0*  HCT 39* 37* 35*  PLT 213 204 174   Lab Results  Component Value Date   TSH 3.01 09/03/2016   Lab Results  Component Value Date   HGBA1C 5.9 (H) 12/15/2014   Lab Results  Component Value Date   CHOL 95 12/26/2015   HDL 42 12/26/2015   LDLCALC 46 12/26/2015   TRIG 37 (A) 12/26/2015   CHOLHDL 2.6 12/07/2014    Significant Diagnostic Results  in last 30 days:  No results found.  Assessment/Plan Skin tear of elbow without complication He was also noted to have a skin tear RLE with steri-strips closure w/o s/s of infection, not certain of the mechanism of injury since the patient is unable to give detailed HPI related to his dementia. It should heal, wait the steri-strips self exfoliation.   Alzheimer's disease ementia, he is lack of safety awareness, resides in memory care unit for close supervision for safety and ADL assistance, he is risk  for falling, last fall was 12/02/16 without apparent injury, he was found lying on right side on floor in front of commode in  Bathroom. He is non ambulatory, transfer with one person assistance.  Hypothyroidism hypothyroidism taking Levothyroxine 177mcg po daily, last TSH 3.01 09/03/16.   PAF (paroxysmal atrial fibrillation) (HCC) Afib, heart rate is in control, taking Carvedilol 3.125mg  bid, Amiodarone 200mg .   Insomnia He sleeps well while on Trazodone 75mg  hs,  Benign prostatic hyperplasia no urinary retention, taking Tamsulosin     Family/ staff Communication: plan of care reviewed with the patient and charge nurse  Labs/tests ordered:  none  Time spend 25 minutes

## 2016-12-04 DIAGNOSIS — S51019A Laceration without foreign body of unspecified elbow, initial encounter: Secondary | ICD-10-CM | POA: Insufficient documentation

## 2016-12-04 DIAGNOSIS — H3093 Unspecified chorioretinal inflammation, bilateral: Secondary | ICD-10-CM | POA: Diagnosis not present

## 2016-12-04 DIAGNOSIS — R262 Difficulty in walking, not elsewhere classified: Secondary | ICD-10-CM | POA: Diagnosis not present

## 2016-12-04 DIAGNOSIS — G9009 Other idiopathic peripheral autonomic neuropathy: Secondary | ICD-10-CM | POA: Diagnosis not present

## 2016-12-04 DIAGNOSIS — M6281 Muscle weakness (generalized): Secondary | ICD-10-CM | POA: Diagnosis not present

## 2016-12-04 DIAGNOSIS — L570 Actinic keratosis: Secondary | ICD-10-CM | POA: Diagnosis not present

## 2016-12-04 DIAGNOSIS — R2681 Unsteadiness on feet: Secondary | ICD-10-CM | POA: Diagnosis not present

## 2016-12-04 DIAGNOSIS — I4891 Unspecified atrial fibrillation: Secondary | ICD-10-CM | POA: Diagnosis not present

## 2016-12-04 DIAGNOSIS — M25569 Pain in unspecified knee: Secondary | ICD-10-CM | POA: Diagnosis not present

## 2016-12-04 DIAGNOSIS — R278 Other lack of coordination: Secondary | ICD-10-CM | POA: Diagnosis not present

## 2016-12-04 DIAGNOSIS — G459 Transient cerebral ischemic attack, unspecified: Secondary | ICD-10-CM | POA: Diagnosis not present

## 2016-12-04 DIAGNOSIS — R609 Edema, unspecified: Secondary | ICD-10-CM | POA: Diagnosis not present

## 2016-12-04 DIAGNOSIS — I10 Essential (primary) hypertension: Secondary | ICD-10-CM | POA: Diagnosis not present

## 2016-12-04 NOTE — Assessment & Plan Note (Signed)
hypothyroidism taking Levothyroxine 185mcg po daily, last TSH 3.01 09/03/16.

## 2016-12-04 NOTE — Assessment & Plan Note (Signed)
Afib, heart rate is in control, taking Carvedilol 3.125mg  bid, Amiodarone 200mg .

## 2016-12-04 NOTE — Assessment & Plan Note (Signed)
ementia, he is lack of safety awareness, resides in memory care unit for close supervision for safety and ADL assistance, he is risk for falling, last fall was 12/02/16 without apparent injury, he was found lying on right side on floor in front of commode in  Bathroom. He is non ambulatory, transfer with one person assistance.

## 2016-12-04 NOTE — Assessment & Plan Note (Signed)
He was also noted to have a skin tear RLE with steri-strips closure w/o s/s of infection, not certain of the mechanism of injury since the patient is unable to give detailed HPI related to his dementia. It should heal, wait the steri-strips self exfoliation.

## 2016-12-04 NOTE — Assessment & Plan Note (Signed)
no urinary retention, taking Tamsulosin

## 2016-12-04 NOTE — Assessment & Plan Note (Signed)
He sleeps well while on Trazodone 75mg  hs,

## 2016-12-06 DIAGNOSIS — H3093 Unspecified chorioretinal inflammation, bilateral: Secondary | ICD-10-CM | POA: Diagnosis not present

## 2016-12-06 DIAGNOSIS — I4891 Unspecified atrial fibrillation: Secondary | ICD-10-CM | POA: Diagnosis not present

## 2016-12-06 DIAGNOSIS — G9009 Other idiopathic peripheral autonomic neuropathy: Secondary | ICD-10-CM | POA: Diagnosis not present

## 2016-12-06 DIAGNOSIS — R2681 Unsteadiness on feet: Secondary | ICD-10-CM | POA: Diagnosis not present

## 2016-12-06 DIAGNOSIS — M6281 Muscle weakness (generalized): Secondary | ICD-10-CM | POA: Diagnosis not present

## 2016-12-06 DIAGNOSIS — R262 Difficulty in walking, not elsewhere classified: Secondary | ICD-10-CM | POA: Diagnosis not present

## 2016-12-06 DIAGNOSIS — R278 Other lack of coordination: Secondary | ICD-10-CM | POA: Diagnosis not present

## 2016-12-06 DIAGNOSIS — R609 Edema, unspecified: Secondary | ICD-10-CM | POA: Diagnosis not present

## 2016-12-06 DIAGNOSIS — G459 Transient cerebral ischemic attack, unspecified: Secondary | ICD-10-CM | POA: Diagnosis not present

## 2016-12-06 DIAGNOSIS — L570 Actinic keratosis: Secondary | ICD-10-CM | POA: Diagnosis not present

## 2016-12-06 DIAGNOSIS — I10 Essential (primary) hypertension: Secondary | ICD-10-CM | POA: Diagnosis not present

## 2016-12-06 DIAGNOSIS — M25569 Pain in unspecified knee: Secondary | ICD-10-CM | POA: Diagnosis not present

## 2016-12-11 DIAGNOSIS — I4891 Unspecified atrial fibrillation: Secondary | ICD-10-CM | POA: Diagnosis not present

## 2016-12-11 DIAGNOSIS — R2681 Unsteadiness on feet: Secondary | ICD-10-CM | POA: Diagnosis not present

## 2016-12-11 DIAGNOSIS — M25569 Pain in unspecified knee: Secondary | ICD-10-CM | POA: Diagnosis not present

## 2016-12-11 DIAGNOSIS — R278 Other lack of coordination: Secondary | ICD-10-CM | POA: Diagnosis not present

## 2016-12-11 DIAGNOSIS — M6281 Muscle weakness (generalized): Secondary | ICD-10-CM | POA: Diagnosis not present

## 2016-12-11 DIAGNOSIS — G459 Transient cerebral ischemic attack, unspecified: Secondary | ICD-10-CM | POA: Diagnosis not present

## 2016-12-11 DIAGNOSIS — I10 Essential (primary) hypertension: Secondary | ICD-10-CM | POA: Diagnosis not present

## 2016-12-11 DIAGNOSIS — R262 Difficulty in walking, not elsewhere classified: Secondary | ICD-10-CM | POA: Diagnosis not present

## 2016-12-11 DIAGNOSIS — G9009 Other idiopathic peripheral autonomic neuropathy: Secondary | ICD-10-CM | POA: Diagnosis not present

## 2016-12-11 DIAGNOSIS — R609 Edema, unspecified: Secondary | ICD-10-CM | POA: Diagnosis not present

## 2016-12-11 DIAGNOSIS — L570 Actinic keratosis: Secondary | ICD-10-CM | POA: Diagnosis not present

## 2016-12-11 DIAGNOSIS — H3093 Unspecified chorioretinal inflammation, bilateral: Secondary | ICD-10-CM | POA: Diagnosis not present

## 2016-12-26 DIAGNOSIS — R531 Weakness: Secondary | ICD-10-CM | POA: Diagnosis not present

## 2016-12-27 ENCOUNTER — Non-Acute Institutional Stay (SKILLED_NURSING_FACILITY): Payer: Medicare Other | Admitting: Nurse Practitioner

## 2016-12-27 ENCOUNTER — Encounter: Payer: Self-pay | Admitting: Nurse Practitioner

## 2016-12-27 DIAGNOSIS — F0280 Dementia in other diseases classified elsewhere without behavioral disturbance: Secondary | ICD-10-CM | POA: Diagnosis not present

## 2016-12-27 DIAGNOSIS — B379 Candidiasis, unspecified: Secondary | ICD-10-CM | POA: Diagnosis not present

## 2016-12-27 DIAGNOSIS — G301 Alzheimer's disease with late onset: Secondary | ICD-10-CM | POA: Diagnosis not present

## 2016-12-27 NOTE — Progress Notes (Signed)
Location:  [facility] Room Number: 105 Place of Service:  SNF (31) Provider:  Zriyah Kopplin, Manxie  NP  Blanchie Serve, MD  Patient Care Team: Blanchie Serve, MD as PCP - General (Internal Medicine) Zebedee Iba., MD as Referring Physician (Ophthalmology) Darlin Coco, MD as Consulting Physician (Cardiology) Garvin Fila, MD as Consulting Physician (Neurology) Festus Aloe, MD as Consulting Physician (Urology) Marilynne Halsted, MD as Referring Physician (Ophthalmology) Lavonna Monarch, MD as Consulting Physician (Dermatology) Gerarda Fraction, MD as Referring Physician (Ophthalmology) Geneieve Duell X, NP as Nurse Practitioner (Internal Medicine)  Extended Emergency Contact Information Primary Emergency Contact: Farr,Laura Address: [address] [place] [address] Johnnette Litter of [place] Phone: [phone] Mobile Phone: [phone] Relation: Daughter Secondary Emergency Contact: Genia Plants States of [place] Phone: [phone] Relation: Daughter  Code Status:  DNR Goals of care: Advanced Directive information Advanced Directives 12/03/2016  Does Patient Have a Medical Advance Directive? Yes  Type of Advance Directive Out of facility DNR (pink MOST or yellow form)  Does patient want to make changes to medical advance directive? No - Patient declined  Would patient like information on creating a medical advance directive? -  Pre-existing out of facility DNR order (yellow form or pink MOST form) -     Chief Complaint  Patient presents with  . Acute Visit    ? yeast in (L) groin area    HPI:  Pt is a 83 y.o. male seen today for an acute visit for irritated reddened area in groins R+L. The patient is incontinent of bowel and bladder, wears adult depend for incontinent care. He cannot make his needs known due to his dementia.    Past Medical History:  Diagnosis Date  . Abnormality of gait 07/04/2012  .  Actinic keratoses   . Arthritis   . Benign prostatic hyperplasia   . BPH (benign prostatic hyperplasia)   . CAD (coronary artery disease) of artery bypass graft 12/02/2013  . CAD (coronary artery disease) of bypass graft    a. s/p CABG 1991 without subsequent interventions.  . Chorioretinitis   . Chronic diastolic CHF (congestive heart failure) ([place])   . Colon cancer ([place])    a. admitted to [facility] 12/07/14 with profound iron deficiency anemia Hgb 4.5, melena, with new diagnosis of invasive adenocarcinoma of colon s/p lap R colectomy on 12/15/14.  . Dementia   . Diverticulitis   . Dizziness and giddiness 07/04/2012  . Edema   . Essential hypertension   . Hereditary and idiopathic peripheral neuropathy 07/04/2012   a history of chronic sensory polyneuropathy of undetermined origin with abnormality of gait. He is walking with a rolling walker. Last MRA of the head January 2013 shows no significant stenosis, MRA of the neck shows mild stenosis of the proximal left internal carotid artery.    Marland Kitchen History of atrial fibrillation   . HLD (hyperlipidemia)   . HTN (hypertension) 12/01/2013  . Hyperlipidemia   . Hypothyroidism 12/01/2013  . Hypothyroidism   . 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 Internal carotid artery stenosis    left  . Iron deficiency anemia    a. 11/2014 - Hgb 4.5 in setting of newly dx colon CA.   . Memory loss   . Mild left ventricular hypertrophy   . Neuromuscular disorder ([place])   . PAF (paroxysmal atrial fibrillation) ([place])    a. Not on anticoag due to bleeding and  fall risk.  . Panuveitis   . Peripheral autonomic neuropathy in disorders classified elsewhere(337.1) 07/04/2012  . Prostatitis, acute    1991  . Retinitis   . Stroke ([place])   . Thyroid disease   . Transient cerebral ischemia 07/04/2012   a posterior circulation TIA in October 2009.    . 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  APPENDECTOMY    . CARDIAC CATHETERIZATION    . CORONARY ARTERY BYPASS GRAFT  1991   grafts to LAD and RCA  . CORONARY ARTERY BYPASS GRAFT  1991    Allergies  Allergen Reactions  . Sulfa Antibiotics Other (See Comments)    unknown  . Ativan [Lorazepam] Anxiety    Outpatient Encounter Medications as of 12/27/2016  Medication Sig  . acetaminophen (TYLENOL) 325 MG tablet Take 650 mg by mouth 2 (two) times daily. Take 2 every 6 hours as needed for mild pain  . amiodarone (PACERONE) 200 MG tablet Take 1 tablet (200 mg total) by mouth daily.  . Artificial Tear Ointment (REFRESH LACRI-LUBE) OINT Apply to eye at bedtime. Both eyes.  . Calcium Carb-Cholecalciferol (CALCIUM-VITAMIN D) 600-400 MG-UNIT TABS Take 1 tablet by mouth 2 (two) times daily.  . carvedilol (COREG) 3.125 MG tablet Take 1 tablet (3.125 mg total) by mouth 2 (two) times daily with a meal.  . levothyroxine (SYNTHROID, LEVOTHROID) 150 MCG tablet Take 150 mcg by mouth daily before breakfast.  . Melatonin 5 MG TABS Take by mouth. Give 1 tablet 1/2 hour before bedtime.  . polyethylene glycol (MIRALAX / GLYCOLAX) packet Take 17 g by mouth daily as needed.   . tamsulosin (FLOMAX) 0.4 MG CAPS capsule Take 0.8 mg by mouth daily.   . traZODone (DESYREL) 150 MG tablet Take 75 mg by mouth at bedtime.   No facility-administered encounter medications on file as of 12/27/2016.    ROS was provided with assistance of staff Review of Systems  Constitutional: Negative for activity change, appetite change, chills, diaphoresis, fatigue and fever.  Gastrointestinal:       Incontinent of bowel.   Genitourinary: Negative for difficulty urinating, discharge, dysuria, flank pain, frequency, genital sores, hematuria, penile pain, penile swelling, scrotal swelling, testicular pain and urgency.       Incontinent of urine.   Musculoskeletal:       Mechanical lift for transfer, wheelchair dependent.   Skin: Negative for color change, pallor, rash and  wound.       Reddened R+L groins.   Neurological:       Dementia.   Psychiatric/Behavioral: Positive for confusion. Negative for agitation and behavioral problems.    Immunization History  Administered Date(s) Administered  . Influenza-Unspecified 12/22/2013, 11/09/2014, 12/05/2015  . PPD Test 01/03/2015  . Pneumococcal Polysaccharide-23 12/02/1993  . Tdap 01/08/2013  . Zoster 12/03/2007   Pertinent  Health Maintenance Due  Topic Date Due  . INFLUENZA VACCINE  09/18/2016  . PNA vac Low Risk Adult (2 of 2 - PCV13) 02/18/2017 (Originally 12/03/1994)   Fall Risk  10/24/2016 02/10/2015 06/27/2014  Falls in the past year? No Yes No  Number falls in past yr: - 1 -  Injury with Fall? - No -  Risk for fall due to : - History of fall(s) Impaired balance/gait  Follow up - Falls evaluation completed -   Functional Status Survey:    Vitals:   12/27/16 1348  BP: 120/70  Pulse: 72  Resp: 16  Temp: 98 F (  36.7 C)  Weight: 186 lb (84.4 kg)  Height: 6' (1.829 m)   Body mass index is 25.23 kg/m. Physical Exam  Constitutional: He appears well-developed and well-nourished. No distress.  Abdominal: Soft. Bowel sounds are normal. He exhibits no distension. There is no tenderness. There is no rebound.  Genitourinary: Penis normal. No penile tenderness.  Neurological: He is alert.  Oriented to self.   Skin: Skin is warm and dry. Rash noted. He is not diaphoretic. There is erythema. No pallor.  Reddened area in groins.   Psychiatric: He has a normal mood and affect. His behavior is normal.    Labs reviewed: Recent Labs    03/19/16 07/30/16 09/02/16  NA 141 140 142  K 3.9 4.0 3.9  BUN 19 25* 22*  CREATININE 1.2 1.2 1.1   Recent Labs    01/16/16 03/19/16 07/30/16  AST 21 21 13*  ALT 13 16 12   ALKPHOS 77 83 57   Recent Labs    03/19/16 06/06/16 09/02/16  WBC 4.4 4.8 13.0  HGB 12.8* 13.0* 12.0*  HCT 39* 37* 35*  PLT 213 204 174   Lab Results  Component Value Date   TSH 3.01  09/03/2016   Lab Results  Component Value Date   HGBA1C 5.9 (H) 12/15/2014   Lab Results  Component Value Date   CHOL 95 12/26/2015   HDL 42 12/26/2015   LDLCALC 46 12/26/2015   TRIG 37 (A) 12/26/2015   CHOLHDL 2.6 12/07/2014    Significant Diagnostic Results in last 30 days:  No results found.  Assessment/Plan Candidiasis R+L groin area, moist and heat contributory, assist the patient with personal hygiene/incontinent care, apply Nystatin powder bid x 2 weeks.   Alzheimer's disease Assist the patient with ADLs and personal hygiene, continue Memory Care Unit for care needs.      Family/ staff Communication: plan of care reviewed with the patient and charge nurse  Time spend 25 minutes.  Labs/tests ordered:

## 2016-12-27 NOTE — Assessment & Plan Note (Signed)
R+L groin area, moist and heat contributory, assist the patient with personal hygiene/incontinent care, apply Nystatin powder bid x 2 weeks.

## 2016-12-27 NOTE — Assessment & Plan Note (Signed)
Assist the patient with ADLs and personal hygiene, continue Memory Care Unit for care needs.

## 2017-01-01 ENCOUNTER — Emergency Department (HOSPITAL_COMMUNITY): Payer: Medicare Other

## 2017-01-01 ENCOUNTER — Emergency Department (HOSPITAL_COMMUNITY)
Admission: EM | Admit: 2017-01-01 | Discharge: 2017-01-02 | Disposition: A | Discharge status: Home / Self Care | Payer: Medicare Other | Attending: Emergency Medicine | Admitting: Emergency Medicine

## 2017-01-01 ENCOUNTER — Other Ambulatory Visit: Payer: Self-pay

## 2017-01-01 ENCOUNTER — Encounter (HOSPITAL_COMMUNITY): Payer: Self-pay

## 2017-01-01 DIAGNOSIS — M79661 Pain in right lower leg: Secondary | ICD-10-CM | POA: Diagnosis not present

## 2017-01-01 DIAGNOSIS — Z87891 Personal history of nicotine dependence: Secondary | ICD-10-CM | POA: Insufficient documentation

## 2017-01-01 DIAGNOSIS — E039 Hypothyroidism, unspecified: Secondary | ICD-10-CM | POA: Insufficient documentation

## 2017-01-01 DIAGNOSIS — M79604 Pain in right leg: Secondary | ICD-10-CM | POA: Diagnosis not present

## 2017-01-01 DIAGNOSIS — I5032 Chronic diastolic (congestive) heart failure: Secondary | ICD-10-CM | POA: Diagnosis not present

## 2017-01-01 DIAGNOSIS — Z79899 Other long term (current) drug therapy: Secondary | ICD-10-CM | POA: Diagnosis not present

## 2017-01-01 DIAGNOSIS — R102 Pelvic and perineal pain: Secondary | ICD-10-CM | POA: Diagnosis not present

## 2017-01-01 DIAGNOSIS — T148XXA Other injury of unspecified body region, initial encounter: Secondary | ICD-10-CM | POA: Diagnosis not present

## 2017-01-01 DIAGNOSIS — G309 Alzheimer's disease, unspecified: Secondary | ICD-10-CM | POA: Insufficient documentation

## 2017-01-01 DIAGNOSIS — N183 Chronic kidney disease, stage 3 (moderate): Secondary | ICD-10-CM | POA: Insufficient documentation

## 2017-01-01 DIAGNOSIS — S8991XA Unspecified injury of right lower leg, initial encounter: Secondary | ICD-10-CM | POA: Diagnosis not present

## 2017-01-01 DIAGNOSIS — Z951 Presence of aortocoronary bypass graft: Secondary | ICD-10-CM | POA: Insufficient documentation

## 2017-01-01 DIAGNOSIS — W19XXXA Unspecified fall, initial encounter: Secondary | ICD-10-CM

## 2017-01-01 DIAGNOSIS — S99911A Unspecified injury of right ankle, initial encounter: Secondary | ICD-10-CM | POA: Diagnosis not present

## 2017-01-01 DIAGNOSIS — Y92129 Unspecified place in nursing home as the place of occurrence of the external cause: Secondary | ICD-10-CM | POA: Insufficient documentation

## 2017-01-01 DIAGNOSIS — M25571 Pain in right ankle and joints of right foot: Secondary | ICD-10-CM | POA: Diagnosis not present

## 2017-01-01 DIAGNOSIS — I2510 Atherosclerotic heart disease of native coronary artery without angina pectoris: Secondary | ICD-10-CM | POA: Insufficient documentation

## 2017-01-01 DIAGNOSIS — I130 Hypertensive heart and chronic kidney disease with heart failure and stage 1 through stage 4 chronic kidney disease, or unspecified chronic kidney disease: Secondary | ICD-10-CM | POA: Insufficient documentation

## 2017-01-01 DIAGNOSIS — R51 Headache: Secondary | ICD-10-CM | POA: Diagnosis not present

## 2017-01-01 DIAGNOSIS — S3993XA Unspecified injury of pelvis, initial encounter: Secondary | ICD-10-CM | POA: Diagnosis not present

## 2017-01-01 NOTE — ED Notes (Signed)
To x-ray and returned 

## 2017-01-01 NOTE — ED Notes (Signed)
Bed: [facility] Expected date:  Expected time:  Means of arrival:  Comments: EMS 83 yo male/fall right lower leg pain/dementia-

## 2017-01-01 NOTE — ED Notes (Signed)
Patient states he can't remember what hurts. Patient has no deformity or rotation to either lower extremity-no obvious injuries. 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

## 2017-01-01 NOTE — ED Provider Notes (Signed)
[facility] DEPT Provider Note   CSN: [id number] Arrival date & time: 01/01/17  1938     History   Chief Complaint Chief Complaint  Patient presents with  . Fall  . Right lower leg pain    HPI TERI DILTZ is a 83 y.o. male with a history of dementia who presents to the emergency department from [facility] with a chief complaint of fall.  EMS reports that the fall was unwitnessed and that the patient fell forward out of his wheelchair.  He was found by staff on the floor and another patient's room.  No blood thinners.  In the emergency department, he complains of right lower extremity pain.  The patient's daughter reports that she feels that the patient is at his baseline mentation.  Level V caveat secondary to dementia.  The history is provided by a relative. No language interpreter was used.    Past Medical History:  Diagnosis Date  . Abnormality of gait 07/04/2012  . Actinic keratoses   . Arthritis   . Benign prostatic hyperplasia   . BPH (benign prostatic hyperplasia)   . CAD (coronary artery disease) of artery bypass graft 12/02/2013  . CAD (coronary artery disease) of bypass graft    a. s/p CABG 1991 without subsequent interventions.  . Chorioretinitis   . Chronic diastolic CHF (congestive heart failure) ([place])   . Colon cancer ([place])    a. admitted to [facility] 12/07/14 with profound iron deficiency anemia Hgb 4.5, melena, with new diagnosis of invasive adenocarcinoma of colon s/p lap R colectomy on 12/15/14.  . Dementia   . Diverticulitis   . Dizziness and giddiness 07/04/2012  . Edema   . Essential hypertension   . Hereditary and idiopathic peripheral neuropathy 07/04/2012   a history of chronic sensory polyneuropathy of undetermined origin with abnormality of gait. He is walking with a rolling walker. Last MRA of the head January 2013 shows no significant stenosis, MRA of the neck shows mild stenosis of the proximal left internal carotid  artery.    Marland Kitchen History of atrial fibrillation   . HLD (hyperlipidemia)   . HTN (hypertension) 12/01/2013  . Hyperlipidemia   . Hypothyroidism 12/01/2013  . Hypothyroidism   . 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 Internal carotid artery stenosis    left  . Iron deficiency anemia    a. 11/2014 - Hgb 4.5 in setting of newly dx colon CA.   . Memory loss   . Mild left ventricular hypertrophy   . Neuromuscular disorder ([place])   . PAF (paroxysmal atrial fibrillation) ([place])    a. Not on anticoag due to bleeding and fall risk.  . Panuveitis   . Peripheral autonomic neuropathy in disorders classified elsewhere(337.1) 07/04/2012  . Prostatitis, acute    1991  . Retinitis   . Stroke ([place])   . Thyroid disease   . Transient cerebral ischemia 07/04/2012   a posterior circulation TIA in October 2009.    . 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 Candidiasis 12/27/2016  . Bleeding skin mole 10/25/2016  . Pneumonia involving right lung 09/02/2016  . Left rib fracture 09/02/2016  . Insomnia 04/01/2016  . Chronic diastolic CHF (congestive heart failure) ([place])   . CKD (chronic kidney disease) stage 3, GFR 30-59 ml/min (HCC) 12/28/2014  . Colon cancer ([place]) of the transverse colon 12/12/2014  . Anemia, iron deficiency   . Encounter  for monitoring antiplatelet therapy   . PAF (paroxysmal atrial fibrillation) ([place]) 12/07/2014  . Arthritis   . Essential hypertension   . Skin cancer 11/11/2014  . Constipation 10/10/2014  . Allergy 01/24/2014  . CAD (coronary artery disease) of artery bypass graft 12/02/2013  . Benign prostatic hyperplasia   . Edema   . Hypothyroidism 12/01/2013  . Alzheimer's disease 07/04/2012  . Peripheral autonomic neuropathy in disorders classified elsewhere 07/04/2012  . 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 Hereditary and idiopathic peripheral neuropathy 07/04/2012  . Abnormality of gait  07/04/2012  . Transient cerebral ischemia 07/04/2012  . History of atrial fibrillation   . 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 Chorioretinal atrophy 12/21/2010    Past Surgical History:  Procedure Laterality Date  . APPENDECTOMY    . CARDIAC CATHETERIZATION    . CORONARY ARTERY BYPASS GRAFT  1991   grafts to LAD and RCA  . CORONARY ARTERY BYPASS GRAFT  1991       Home Medications    Prior to Admission medications   Medication Sig Start Date End Date Taking? Authorizing Provider  acetaminophen (TYLENOL) 325 MG tablet Take 650 mg by mouth 2 (two) times daily. Take 2 every 6 hours as needed for mild pain    [provider]  amiodarone (PACERONE) 200 MG tablet Take 1 tablet (200 mg total) by mouth daily. 12/21/14   Theodis Blaze, MD  Artificial Tear Ointment (REFRESH LACRI-LUBE) OINT Apply to eye at bedtime. Both eyes.    [provider]  Calcium Carb-Cholecalciferol (CALCIUM-VITAMIN D) 600-400 MG-UNIT TABS Take 1 tablet by mouth 2 (two) times daily.    [provider]  carvedilol (COREG) 3.125 MG tablet Take 1 tablet (3.125 mg total) by mouth 2 (two) times daily with a meal. 12/21/14   Theodis Blaze, MD  levothyroxine (SYNTHROID, LEVOTHROID) 150 MCG tablet Take 150 mcg by mouth daily before breakfast.    [provider]  Melatonin 5 MG TABS Take by mouth. Give 1 tablet 1/2 hour before bedtime.    [provider]  polyethylene glycol (MIRALAX / GLYCOLAX) packet Take 17 g by mouth daily as needed.     [provider]  tamsulosin (FLOMAX) 0.4 MG CAPS capsule Take 0.8 mg by mouth daily.     [provider]  traZODone (DESYREL) 150 MG tablet Take 75 mg by mouth at bedtime.    [provider]    Family History Family History  Problem Relation Age of Onset  . Pneumonia Mother   . Diabetes Mother   . Cancer Mother        colon  . Hypertension Mother   . Congestive Heart Failure Father   . Hypertension Father   .  Coronary artery disease Father   . Heart attack Father   . Diabetes Father   . Heart failure Father   . Stroke Father   . Diabetes Sister   . Cancer Sister        breast  . Coronary artery disease Brother   . Heart disease Sister   . Diabetes Sister   . Kidney failure Sister   . Heart attack Sister     Social History Social History   Tobacco Use  . Smoking status: Former Smoker    Last attempt to quit: 12/03/1978    Years since quitting: 38.1  . Smokeless tobacco: Never Used  Substance Use Topics  . Alcohol use: No  . Drug use: No  Allergies   Sulfa antibiotics and Ativan [lorazepam]   Review of Systems Review of Systems  Unable to perform ROS: Dementia  Musculoskeletal: Positive for arthralgias and myalgias. Negative for back pain and neck pain.  Skin: Negative for wound.     Physical Exam Updated Vital Signs BP (!) 164/100 (BP Location: Right Arm)   Pulse 63   Temp (!) 97.5 F (36.4 C) (Oral)   Resp 16   SpO2 96%   Physical Exam  Constitutional: He appears well-developed.  Pleasantly demented.  HENT:  Head: Normocephalic and atraumatic. Head is without raccoon's eyes and without Battle's sign.  Eyes: Conjunctivae are normal.  Neck: Normal range of motion. Neck supple.  Cardiovascular: Normal rate and regular rhythm. Exam reveals no friction rub.  No murmur heard. Pulmonary/Chest: Effort normal and breath sounds normal. No stridor. No respiratory distress. He has no wheezes. He has no rales. He exhibits no tenderness.  Abdominal: Soft. He exhibits no distension.  Musculoskeletal: He exhibits tenderness. He exhibits no edema or deformity.  Tender to palpation over the right distal lower leg.  No tenderness to palpation of the left lower leg, ankle, bilateral knees.  Pelvis and bilateral hips are nontender to palpation.  No tenderness to palpation over the ribs, clavicles, bilateral shoulders, elbows, or wrists.  The remainder of the bilateral upper  extremities, and the bilateral thighs are nontender to palpation.  No tenderness to palpation over the cervical, thoracic, or lumbar spinous processes around the paraspinal muscles.  No obvious deformities.  No crepitus or step-offs.  5 out of 5 strength against resistance with dorsiflexion plantarflexion of the bilateral lower extremities.  Radial, DP, and PT pulses are 2+ and symmetric.   Neurological: He is alert.  Skin: Skin is warm and dry.  Psychiatric: His behavior is normal.  Nursing note and vitals reviewed.    ED Treatments / Results  Labs (all labs ordered are listed, but only abnormal results are displayed) Labs Reviewed - No data to display  EKG  EKG Interpretation None       Radiology Dg Tibia/fibula Right  Result Date: 01/01/2017 CLINICAL DATA:  Fall from a wheelchair EXAM: RIGHT TIBIA AND FIBULA - 2 VIEW COMPARISON:  None. FINDINGS: Possible subtle nondisplaced distal fibular fracture on the obliques view. The proximal tibia and fibula appear intact. Moderate degenerative changes at the right knee. Small effusion. Clips within the medial soft tissues. IMPRESSION: Possible subtle nondisplaced distal fibular fracture. Electronically Signed   By: Donavan Foil M.D.   On: 01/01/2017 21:58   Dg Ankle Complete Right  Result Date: 01/01/2017 CLINICAL DATA:  Fall from wheelchair, tender over the lateral ankle EXAM: RIGHT ANKLE - COMPLETE 3+ VIEW COMPARISON:  None. FINDINGS: Osteopenia. Soft tissue swelling laterally. Possible lucency on the lateral view within the distal fibular shaft and metaphysis. IMPRESSION: Questionable subtle nondisplaced fracture lucency on one view through the distal shaft and metaphysis of the fibula. Electronically Signed   By: Donavan Foil M.D.   On: 01/01/2017 21:59   Ct Head Wo Contrast  Result Date: 01/01/2017 CLINICAL DATA:  Status post unwitnessed fall from wheelchair, with severe confusion. Headache. EXAM: CT HEAD WITHOUT CONTRAST  TECHNIQUE: Contiguous axial images were obtained from the base of the skull through the vertex without intravenous contrast. COMPARISON:  MRI of the brain performed 01/29/2013 FINDINGS: Brain: No evidence of acute infarction, hemorrhage, hydrocephalus, extra-axial collection or mass lesion/mass effect. Prominence of the ventricles and sulci reflects moderate cortical volume loss. Cerebellar  atrophy is noted. Mild periventricular white matter change likely reflects small vessel ischemic microangiopathy. The brainstem and fourth ventricle are within normal limits. The basal ganglia are unremarkable in appearance. The cerebral hemispheres demonstrate grossly normal gray-white differentiation. No mass effect or midline shift is seen. Vascular: No hyperdense vessel or unexpected calcification. Skull: There is no evidence of fracture; visualized osseous structures are unremarkable in appearance. Sinuses/Orbits: The visualized portions of the orbits are within normal limits. The paranasal sinuses and mastoid air cells are well-aerated. Other: No significant soft tissue abnormalities are seen. IMPRESSION: 1. No evidence of traumatic intracranial injury or fracture. 2. Moderate cortical volume loss and scattered small vessel ischemic microangiopathy. Electronically Signed   By: Garald Balding M.D.   On: 01/01/2017 23:25   Ct Tibia Fibula Right Wo Contrast  Result Date: 01/01/2017 CLINICAL DATA:  Status post unwitnessed fall from wheelchair, with right lower extremity pain. EXAM: CT OF THE LOWER RIGHT EXTREMITY WITHOUT CONTRAST TECHNIQUE: Multidetector CT imaging of the right lower extremity was performed according to the standard protocol. COMPARISON:  Right tibia/fibula radiographs performed earlier today at 9:42 p.m. FINDINGS: Bones/Joint/Cartilage Evaluation is markedly suboptimal due to motion artifact. No definite fracture is seen. Cortical irregularity at the distal fibula at varying locations on each sequence  appears to reflect motion artifact. Sclerosis is noted at the lateral tibial plateau. Subcortical cysts are seen at the medial tibial plateau and tibial spine. Marginal osteophytes are noted at the lateral compartment. Tibial spine and wall osteophytes are also seen. Underlying osteopenia is noted. A small knee joint effusion is noted. The cartilage is not well assessed on CT. Ligaments Suboptimally assessed by CT. Muscles and Tendons There is underlying diffuse atrophy of the medial gastrocnemius muscle. Visualized tendons are grossly unremarkable. Soft tissues Soft tissue edema is seen tracking along the right lower extremity. No focal fluid collections are seen. The vasculature is not well assessed without contrast. Scattered vascular calcifications are seen. IMPRESSION: 1. No definite evidence of fracture. Evaluation is markedly suboptimal due to motion artifact. 2. Mild osteoarthritis at the right knee. 3. Small knee joint effusion. 4. Underlying atrophy of the medial gastrocnemius muscle. 5. Soft tissue edema tracking about the right lower extremity. 6. Scattered vascular calcifications seen. Electronically Signed   By: Garald Balding M.D.   On: 01/01/2017 23:56    Procedures Procedures (including critical care time)  Medications Ordered in ED Medications - No data to display   Initial Impression / Assessment and Plan / ED Course  I have reviewed the triage vital signs and the nursing notes.  Pertinent labs & imaging results that were available during my care of the patient were reviewed by me and considered in my medical decision making (see chart for details).     83 year old male with history of dimension presenting after an unwitnessed fall from his wheelchair.  Physical exam he has tenderness over the right distal portion of the lower leg.  No other tenderness to palpation.  X-ray of the tibia and fibula demonstrate a possible subtle nondisplaced distal fibular fracture.  Discussed the  patient with Dr. Kathrynn Humble, attending physician.  CT head negative for intracranial injury or fracture.  CT tibia-fibula with no definite evidence of fracture; the lucency is thought to be artifact.  The images were reviewed by me, and after review coupled with my clinical examination that the cortical irregularity at the distal fibula on CT is motion artifact given the changes in the subcutaneous tissue that corresponds with the  changes and the cortical irregularity.  Discussed these findings with the patient's daughter who is in agreement with my examination.  At this time, the patient is safe to discharge back to [facility].  Strict return precautions given.  No acute distress.  The patient is safe for discharge at this time.  Final Clinical Impressions(s) / ED Diagnoses   Final diagnoses:  Fall, initial encounter  Right leg pain    ED Discharge Orders    None       Joanne Gavel, PA-C 01/02/17 0114    Varney Biles, MD 01/03/17 0202

## 2017-01-01 NOTE — ED Triage Notes (Signed)
Patient arrives by EMS from [facility] at [facility]. Unwitnessed fall from his wheelchair. Staff found him on the floor in another patient's room. Patient complaining of pain to RLE. Patient has dementia. No obvious injury.

## 2017-01-02 ENCOUNTER — Non-Acute Institutional Stay (SKILLED_NURSING_FACILITY): Payer: Medicare Other | Admitting: Nurse Practitioner

## 2017-01-02 ENCOUNTER — Encounter: Payer: Self-pay | Admitting: Nurse Practitioner

## 2017-01-02 DIAGNOSIS — F0281 Dementia in other diseases classified elsewhere with behavioral disturbance: Secondary | ICD-10-CM | POA: Diagnosis not present

## 2017-01-02 DIAGNOSIS — G301 Alzheimer's disease with late onset: Secondary | ICD-10-CM | POA: Diagnosis not present

## 2017-01-02 DIAGNOSIS — W19XXXA Unspecified fall, initial encounter: Secondary | ICD-10-CM | POA: Insufficient documentation

## 2017-01-02 DIAGNOSIS — W19XXXD Unspecified fall, subsequent encounter: Secondary | ICD-10-CM | POA: Diagnosis not present

## 2017-01-02 DIAGNOSIS — F5101 Primary insomnia: Secondary | ICD-10-CM

## 2017-01-02 DIAGNOSIS — Z7409 Other reduced mobility: Secondary | ICD-10-CM | POA: Diagnosis not present

## 2017-01-02 DIAGNOSIS — E039 Hypothyroidism, unspecified: Secondary | ICD-10-CM | POA: Diagnosis not present

## 2017-01-02 DIAGNOSIS — S8002XA Contusion of left knee, initial encounter: Secondary | ICD-10-CM | POA: Diagnosis not present

## 2017-01-02 NOTE — Progress Notes (Signed)
Location:  [facility] Room Number: 105 Place of Service:  SNF (31) Provider:  Melessa Cowell, Manxie  NP  Blanchie Serve, MD  Patient Care Team: Blanchie Serve, MD as PCP - General (Internal Medicine) Zebedee Iba., MD as Referring Physician (Ophthalmology) Darlin Coco, MD as Consulting Physician (Cardiology) Garvin Fila, MD as Consulting Physician (Neurology) Festus Aloe, MD as Consulting Physician (Urology) Marilynne Halsted, MD as Referring Physician (Ophthalmology) Lavonna Monarch, MD as Consulting Physician (Dermatology) Gerarda Fraction, MD as Referring Physician (Ophthalmology) Tuere Nwosu X, NP as Nurse Practitioner (Internal Medicine)  Extended Emergency Contact Information Primary Emergency Contact: Farr,Laura Address: [address] [place] [address] Johnnette Litter of [place] Phone: [phone] Mobile Phone: [phone] Relation: Daughter Secondary Emergency Contact: Genia Plants [place] of [place] Phone: [phone] Relation: Daughter  Code Status:  DNR Goals of care: Advanced Directive information Advanced Directives 01/01/2017  Does Patient Have a Medical Advance Directive? (No Data)  Type of Advance Directive -  Does patient want to make changes to medical advance directive? -  Would patient like information on creating a medical advance directive? -  Pre-existing out of facility DNR order (yellow form or pink MOST form) -     Chief Complaint  Patient presents with  . Acute Visit    behaviors    HPI:  Pt is a 83 y.o. male seen today for an acute visit for the patient's combative behaviors after returned from ED evaluation for fall and right leg pain around 2 am 01/02/17. Reported the patient fell forward from his wheelchair in the another patient's room. ED X-ray of the tibia and fibula demonstrate a possible subtle nondisplaced distal fibular fracture.  CT head negative for intracranial  injury or fracture.  CT tibia-fibula with no definite evidence of fracture; the lucency is thought to be artifact.    The patient has severe dementia, resides in Memory care unit in SNF at [facility]. He usually propels himself on unit, sit to stand lift for transfer, mood and behaviors are managed with Trazodone 100mg  qhs. The patient is in his usual sate of health upon my visit today. He is pleasant, w/c dependent, oriented to himself, follows simple directions, agitated and combative when he is approached and not comprehending his surroundings.   Past Medical History:  Diagnosis Date  . Abnormality of gait 07/04/2012  . Actinic keratoses   . Arthritis   . Benign prostatic hyperplasia   . BPH (benign prostatic hyperplasia)   . CAD (coronary artery disease) of artery bypass graft 12/02/2013  . CAD (coronary artery disease) of bypass graft    a. s/p CABG 1991 without subsequent interventions.  . Chorioretinitis   . Chronic diastolic CHF (congestive heart failure) ([place])   . Colon cancer ([place])    a. [facility]14 with profound iron deficiency anemia Hgb 4.5, melena, with new diagnosis of invasive adenocarcinoma of colon s/p lap R colectomy on 12/15/14.  . Dementia   . Diverticulitis   . Dizziness and giddiness 07/04/2012  . Edema   . Essential hypertension   . Hereditary and idiopathic peripheral neuropathy 07/04/2012   a history of chronic sensory polyneuropathy of undetermined origin with abnormality of gait. He is walking with a rolling walker. Last MRA of the head January 2013 shows no significant stenosis, MRA of the neck shows mild stenosis of the proximal left internal carotid artery.    Marland Kitchen History of  atrial fibrillation   . HLD (hyperlipidemia)   . HTN (hypertension) 12/01/2013  . Hyperlipidemia   . Hypothyroidism 12/01/2013  . Hypothyroidism   . 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 Internal carotid artery stenosis    left  . Iron deficiency anemia    a.  11/2014 - Hgb 4.5 in setting of newly dx colon CA.   . Memory loss   . Mild left ventricular hypertrophy   . Neuromuscular disorder ([place])   . PAF (paroxysmal atrial fibrillation) ([place])    a. Not on anticoag due to bleeding and fall risk.  . Panuveitis   . Peripheral autonomic neuropathy in disorders classified elsewhere(337.1) 07/04/2012  . Prostatitis, acute    1991  . Retinitis   . Stroke ([place])   . Thyroid disease   . Transient cerebral ischemia 07/04/2012   a posterior circulation TIA in October 2009.    . 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 APPENDECTOMY    . CARDIAC CATHETERIZATION    . COLON RESECTION N/A 12/15/2014   Procedure: Laparoscopic partial colectomy;  Surgeon: Autumn Messing III, MD;  Location: [facility] ORS;  Service: General;  Laterality: N/A;  . COLONOSCOPY WITH PROPOFOL N/A 12/12/2014   Procedure: COLONOSCOPY WITH PROPOFOL;  Surgeon: Gatha Mayer, MD;  Location: [facility] ENDOSCOPY;  Service: Endoscopy;  Laterality: N/A;  . CORONARY ARTERY BYPASS GRAFT  1991   grafts to LAD and RCA  . CORONARY ARTERY BYPASS GRAFT  1991    Allergies  Allergen Reactions  . Sulfa Antibiotics Other (See Comments)    unknown  . Ativan [Lorazepam] Anxiety    Outpatient Encounter Medications as of 01/02/2017  Medication Sig  . acetaminophen (TYLENOL) 325 MG tablet Take 650 mg by mouth 2 (two) times daily. Take 2 every 6 hours as needed for mild pain  . amiodarone (PACERONE) 200 MG tablet Take 1 tablet (200 mg total) by mouth daily.  . Artificial Tear Ointment (REFRESH LACRI-LUBE) OINT Apply to eye at bedtime. Both eyes.  . Calcium Carb-Cholecalciferol (CALCIUM-VITAMIN D) 600-400 MG-UNIT TABS Take 1 tablet by mouth 2 (two) times daily.  . carvedilol (COREG) 3.125 MG tablet Take 1 tablet (3.125 mg total) by mouth 2 (two) times daily with a meal.  . levothyroxine (SYNTHROID, LEVOTHROID) 150 MCG tablet Take 150 mcg by mouth daily before  breakfast.  . Melatonin 5 MG TABS Take by mouth. Give 1 tablet 1/2 hour before bedtime.  . polyethylene glycol (MIRALAX / GLYCOLAX) packet Take 17 g by mouth daily as needed.   . tamsulosin (FLOMAX) 0.4 MG CAPS capsule Take 0.8 mg by mouth daily.   . traZODone (DESYREL) 150 MG tablet Take 75 mg by mouth at bedtime.   No facility-administered encounter medications on file as of 01/02/2017.   ROS was provided with assistance of staff  Review of Systems  Constitutional: Negative for activity change, appetite change, chills, diaphoresis, fatigue and fever.  HENT: Positive for hearing loss. Negative for congestion, trouble swallowing and voice change.   Eyes: Negative for visual disturbance.  Respiratory: Negative for cough, shortness of breath and wheezing.   Cardiovascular: Positive for leg swelling. Negative for chest pain and palpitations.  Gastrointestinal: Negative for abdominal distention, abdominal pain, constipation, diarrhea, nausea and vomiting.  Endocrine: Negative for cold intolerance.  Genitourinary: Negative for difficulty urinating, dysuria and urgency.       Incontinent of urine.   Musculoskeletal: Positive for gait problem. Negative for  back pain and neck pain.       Wheelchair dependent.   Skin: Negative for color change, pallor, rash and wound.       Multiples ecchymosis in legs  Neurological: Negative for tremors, speech difficulty, weakness and headaches.  Psychiatric/Behavioral: Positive for agitation, behavioral problems and confusion. Negative for hallucinations and sleep disturbance. The patient is not nervous/anxious.     Immunization History  Administered Date(s) Administered  . Influenza-Unspecified 12/22/2013, 11/09/2014, 12/05/2015  . PPD Test 01/03/2015  . Pneumococcal Polysaccharide-23 12/02/1993  . Tdap 01/08/2013  . Zoster 12/03/2007   Pertinent  Health Maintenance Due  Topic Date Due  . INFLUENZA VACCINE  09/18/2016  . PNA vac Low Risk Adult (2 of  2 - PCV13) 02/18/2017 (Originally 12/03/1994)   Fall Risk  10/24/2016 02/10/2015 06/27/2014  Falls in the past year? No Yes No  Number falls in past yr: - 1 -  Injury with Fall? - No -  Risk for fall due to : - History of fall(s) Impaired balance/gait  Follow up - Falls evaluation completed -   Functional Status Survey:    Vitals:   01/02/17 1030  BP: 120/70  Pulse: 68  Resp: 20  Temp: (!) 97.5 F (36.4 C)  Weight: 186 lb (84.4 kg)  Height: 6' (1.829 m)   Body mass index is 25.23 kg/m. Physical Exam  Constitutional: He appears well-developed and well-nourished.  HENT:  Head: Normocephalic and atraumatic.  Eyes: Conjunctivae and EOM are normal. Pupils are equal, round, and reactive to light.  Neck: Normal range of motion. Neck supple. No JVD present. No thyromegaly present.  Cardiovascular: Normal rate and normal heart sounds.  No murmur heard. Irregular heart beats.   Pulmonary/Chest: Effort normal and breath sounds normal. He has no wheezes. He has no rales.  Abdominal: Soft. Bowel sounds are normal. He exhibits no distension. There is no tenderness. There is no rebound.  Musculoskeletal: Normal range of motion. He exhibits edema. He exhibits no tenderness.  Trace edema in R+l lower legs.   Neurological: He is alert. He exhibits normal muscle tone.  Oriented to self  Skin: Skin is warm and dry. No rash noted. No pallor.  Small ecchymoses in legs.   Psychiatric: He has a normal mood and affect.    Labs reviewed: Recent Labs    03/19/16 07/30/16 09/02/16  NA 141 140 142  K 3.9 4.0 3.9  BUN 19 25* 22*  CREATININE 1.2 1.2 1.1   Recent Labs    01/16/16 03/19/16 07/30/16  AST 21 21 13*  ALT 13 16 12   ALKPHOS 77 83 57   Recent Labs    03/19/16 06/06/16 09/02/16  WBC 4.4 4.8 13.0  HGB 12.8* 13.0* 12.0*  HCT 39* 37* 35*  PLT 213 204 174   Lab Results  Component Value Date   TSH 3.01 09/03/2016   Lab Results  Component Value Date   HGBA1C 5.9 (H) 12/15/2014     Lab Results  Component Value Date   CHOL 95 12/26/2015   HDL 42 12/26/2015   LDLCALC 46 12/26/2015   TRIG 37 (A) 12/26/2015   CHOLHDL 2.6 12/07/2014    Significant Diagnostic Results in last 30 days:  Dg Tibia/fibula Right  Result Date: 01/01/2017 CLINICAL DATA:  Fall from a wheelchair EXAM: RIGHT TIBIA AND FIBULA - 2 VIEW COMPARISON:  None. FINDINGS: Possible subtle nondisplaced distal fibular fracture on the obliques view. The proximal tibia and fibula appear intact. Moderate degenerative changes at  the right knee. Small effusion. Clips within the medial soft tissues. IMPRESSION: Possible subtle nondisplaced distal fibular fracture. Electronically Signed   By: Donavan Foil M.D.   On: 01/01/2017 21:58   Dg Ankle Complete Right  Result Date: 01/01/2017 CLINICAL DATA:  Fall from wheelchair, tender over the lateral ankle EXAM: RIGHT ANKLE - COMPLETE 3+ VIEW COMPARISON:  None. FINDINGS: Osteopenia. Soft tissue swelling laterally. Possible lucency on the lateral view within the distal fibular shaft and metaphysis. IMPRESSION: Questionable subtle nondisplaced fracture lucency on one view through the distal shaft and metaphysis of the fibula. Electronically Signed   By: Donavan Foil M.D.   On: 01/01/2017 21:59   Ct Head Wo Contrast  Result Date: 01/01/2017 CLINICAL DATA:  Status post unwitnessed fall from wheelchair, with severe confusion. Headache. EXAM: CT HEAD WITHOUT CONTRAST TECHNIQUE: Contiguous axial images were obtained from the base of the skull through the vertex without intravenous contrast. COMPARISON:  MRI of the brain performed 01/29/2013 FINDINGS: Brain: No evidence of acute infarction, hemorrhage, hydrocephalus, extra-axial collection or mass lesion/mass effect. Prominence of the ventricles and sulci reflects moderate cortical volume loss. Cerebellar atrophy is noted. Mild periventricular white matter change likely reflects small vessel ischemic microangiopathy. The brainstem  and fourth ventricle are within normal limits. The basal ganglia are unremarkable in appearance. The cerebral hemispheres demonstrate grossly normal gray-white differentiation. No mass effect or midline shift is seen. Vascular: No hyperdense vessel or unexpected calcification. Skull: There is no evidence of fracture; visualized osseous structures are unremarkable in appearance. Sinuses/Orbits: The visualized portions of the orbits are within normal limits. The paranasal sinuses and mastoid air cells are well-aerated. Other: No significant soft tissue abnormalities are seen. IMPRESSION: 1. No evidence of traumatic intracranial injury or fracture. 2. Moderate cortical volume loss and scattered small vessel ischemic microangiopathy. Electronically Signed   By: Garald Balding M.D.   On: 01/01/2017 23:25   Ct Tibia Fibula Right Wo Contrast  Result Date: 01/01/2017 CLINICAL DATA:  Status post unwitnessed fall from wheelchair, with right lower extremity pain. EXAM: CT OF THE LOWER RIGHT EXTREMITY WITHOUT CONTRAST TECHNIQUE: Multidetector CT imaging of the right lower extremity was performed according to the standard protocol. COMPARISON:  Right tibia/fibula radiographs performed earlier today at 9:42 p.m. FINDINGS: Bones/Joint/Cartilage Evaluation is markedly suboptimal due to motion artifact. No definite fracture is seen. Cortical irregularity at the distal fibula at varying locations on each sequence appears to reflect motion artifact. Sclerosis is noted at the lateral tibial plateau. Subcortical cysts are seen at the medial tibial plateau and tibial spine. Marginal osteophytes are noted at the lateral compartment. Tibial spine and wall osteophytes are also seen. Underlying osteopenia is noted. A small knee joint effusion is noted. The cartilage is not well assessed on CT. Ligaments Suboptimally assessed by CT. Muscles and Tendons There is underlying diffuse atrophy of the medial gastrocnemius muscle. Visualized  tendons are grossly unremarkable. Soft tissues Soft tissue edema is seen tracking along the right lower extremity. No focal fluid collections are seen. The vasculature is not well assessed without contrast. Scattered vascular calcifications are seen. IMPRESSION: 1. No definite evidence of fracture. Evaluation is markedly suboptimal due to motion artifact. 2. Mild osteoarthritis at the right knee. 3. Small knee joint effusion. 4. Underlying atrophy of the medial gastrocnemius muscle. 5. Soft tissue edema tracking about the right lower extremity. 6. Scattered vascular calcifications seen. Electronically Signed   By: Garald Balding M.D.   On: 01/01/2017 23:56    Assessment/Plan Fall  ED evaluation for fall and right leg pain, returned to [facility] around 2 am 01/02/17. Reported the patient fell forward from his wheelchair in the another patient's room. ED X-ray of the tibia and fibula demonstrate a possible subtle nondisplaced distal fibular fracture.  CT head negative for intracranial injury or fracture.  CT tibia-fibula with no definite evidence of fracture; the lucency is thought to be artifact.   No pain or decreased of ROM of R+L hips, knees, ankles. No pain in the back, shoulders, elbows, wrists, fingers.  Intensive supervision for safety since the patient has poor safety awareness and his mechanical lift for transfer.    Alzheimer's disease The patient has severe dementia, resides in Memory care unit in SNF at [facility]. He usually propels himself on unit, sit to stand lift for transfer, mood and behaviors are managed with Trazodone 100mg  qhs. The patient is in his usual sate of health upon my visit today. He is pleasant, w/c dependent, oriented to himself, follows simple directions, agitated and combative when he is approached and not comprehending his surroundings.  Will update CBC CMP TSH   Insomnia Sleeps and eats well, continue Trazodone 100mg  qd.   Hypothyroidism Will update TSH, last TSH 3.01  09/03/16, continue Levothyroxine 137mcg po daily. Amiodarone may contribute to the problem.      Family/ staff Communication: plan of care reviewed with the patient and charge nurse.   Labs/tests ordered:  Update CBC CMP TSH  Time spend 25 minutes.

## 2017-01-02 NOTE — Assessment & Plan Note (Addendum)
Will update TSH, last TSH 3.01 09/03/16, continue Levothyroxine 129mcg po daily. Amiodarone may contribute to the problem.

## 2017-01-02 NOTE — Assessment & Plan Note (Signed)
ED evaluation for fall and right leg pain, returned to [facility] around 2 am 01/02/17. Reported the patient fell forward from his wheelchair in the another patient's room. ED X-ray of the tibia and fibula demonstrate a possible subtle nondisplaced distal fibular fracture.  CT head negative for intracranial injury or fracture.  CT tibia-fibula with no definite evidence of fracture; the lucency is thought to be artifact.   No pain or decreased of ROM of R+L hips, knees, ankles. No pain in the back, shoulders, elbows, wrists, fingers.  Intensive supervision for safety since the patient has poor safety awareness and his mechanical lift for transfer.

## 2017-01-02 NOTE — ED Notes (Signed)
[facility] called  

## 2017-01-02 NOTE — ED Notes (Signed)
[facility] notified for pt arrival

## 2017-01-02 NOTE — Assessment & Plan Note (Signed)
Sleeps and eats well, continue Trazodone 100mg  qd.

## 2017-01-02 NOTE — Discharge Instructions (Signed)
If you develop any new or worsening symptoms, including worsening confusion, a new fall, chest pain, or shortness of breath, please return to the emergency department for reevaluation.  Ice can be applied for 15-20 minutes to any areas that are sore up to 3-4 times a day. Tylenol or ibuprofen can be taken for pain control.

## 2017-01-02 NOTE — Assessment & Plan Note (Signed)
The patient has severe dementia, resides in Memory care unit in SNF at [facility]. He usually propels himself on unit, sit to stand lift for transfer, mood and behaviors are managed with Trazodone 100mg  qhs. The patient is in his usual sate of health upon my visit today. He is pleasant, w/c dependent, oriented to himself, follows simple directions, agitated and combative when he is approached and not comprehending his surroundings.  Will update CBC CMP TSH

## 2017-01-06 DIAGNOSIS — R531 Weakness: Secondary | ICD-10-CM | POA: Diagnosis not present

## 2017-01-07 DIAGNOSIS — E039 Hypothyroidism, unspecified: Secondary | ICD-10-CM | POA: Diagnosis not present

## 2017-01-07 DIAGNOSIS — N182 Chronic kidney disease, stage 2 (mild): Secondary | ICD-10-CM | POA: Diagnosis not present

## 2017-01-07 DIAGNOSIS — D649 Anemia, unspecified: Secondary | ICD-10-CM | POA: Diagnosis not present

## 2017-01-07 LAB — CBC AND DIFFERENTIAL
HCT: 33 — AB (ref 41–53)
HEMOGLOBIN: 11.2 — AB (ref 13.5–17.5)
Platelets: 165 (ref 150–399)
WBC: 4.1

## 2017-01-07 LAB — BASIC METABOLIC PANEL
BUN: 23 — AB (ref 4–21)
CREATININE: 1.3 (ref 0.6–1.3)
Glucose: 80
Potassium: 3.9 (ref 3.4–5.3)
SODIUM: 143 (ref 137–147)

## 2017-01-07 LAB — TSH: TSH: 5.99 — AB (ref 0.41–5.90)

## 2017-01-07 LAB — HEPATIC FUNCTION PANEL
ALK PHOS: 82 (ref 25–125)
ALT: 22 (ref 10–40)
AST: 20 (ref 14–40)
Bilirubin, Total: 0.7

## 2017-01-17 ENCOUNTER — Non-Acute Institutional Stay (SKILLED_NURSING_FACILITY): Payer: Medicare Other | Admitting: Internal Medicine

## 2017-01-17 ENCOUNTER — Encounter: Payer: Self-pay | Admitting: Internal Medicine

## 2017-01-17 DIAGNOSIS — E032 Hypothyroidism due to medicaments and other exogenous substances: Secondary | ICD-10-CM

## 2017-01-17 DIAGNOSIS — L22 Diaper dermatitis: Secondary | ICD-10-CM | POA: Diagnosis not present

## 2017-01-17 DIAGNOSIS — R2681 Unsteadiness on feet: Secondary | ICD-10-CM

## 2017-01-17 DIAGNOSIS — I739 Peripheral vascular disease, unspecified: Secondary | ICD-10-CM | POA: Diagnosis not present

## 2017-01-17 DIAGNOSIS — I480 Paroxysmal atrial fibrillation: Secondary | ICD-10-CM | POA: Diagnosis not present

## 2017-01-17 DIAGNOSIS — H04123 Dry eye syndrome of bilateral lacrimal glands: Secondary | ICD-10-CM | POA: Insufficient documentation

## 2017-01-17 DIAGNOSIS — B372 Candidiasis of skin and nail: Secondary | ICD-10-CM | POA: Diagnosis not present

## 2017-01-17 NOTE — Progress Notes (Signed)
Location:  [facility] Room Number: 105 Place of Service:  SNF [phone]) Provider:  Blanchie Serve MD  Blanchie Serve, MD  Patient Care Team: Blanchie Serve, MD as PCP - General (Internal Medicine) Zebedee Iba., MD as Referring Physician (Ophthalmology) Darlin Coco, MD as Consulting Physician (Cardiology) Garvin Fila, MD as Consulting Physician (Neurology) Festus Aloe, MD as Consulting Physician (Urology) Marilynne Halsted, MD as Referring Physician (Ophthalmology) Lavonna Monarch, MD as Consulting Physician (Dermatology) Gerarda Fraction, MD as Referring Physician (Ophthalmology) Mast, Man X, NP as Nurse Practitioner (Internal Medicine)  Extended Emergency Contact Information Primary Emergency Contact: Farr,Laura Address: [address] [place] [address] Johnnette Litter of [place] Phone: [phone] Mobile Phone: [phone] Relation: Daughter Secondary Emergency Contact: Genia Plants [place] of [place] Phone: [phone] Relation: Daughter  Code Status:  DNR  Goals of care: Advanced Directive information Advanced Directives 01/17/2017  Does Patient Have a Medical Advance Directive? No  Type of Advance Directive Out of facility DNR (pink MOST or yellow form)  Does patient want to make changes to medical advance directive? No - Patient declined  Would patient like information on creating a medical advance directive? No - Patient declined  Pre-existing out of facility DNR order (yellow form or pink MOST form) Yellow form placed in chart (order not valid for inpatient use)     Chief Complaint  Patient presents with  . Medical Management of Chronic Issues    HPI:  Pt is a 83 y.o. male seen today for medical management of chronic diseases. He does not participate in HPI or ROS. He has dementia. He gets around on his wheelchair. He is incontinent with his bowel and bladder. He has redness to groin and  perineal area. He takes his medications. He needs assistance with transfers. He had a fall on 01/01/17 and had ED visit where CT head was negative for bleed and fracture. RLE was also imaged due to pain and fracture was ruled out.  He has trouble swallowing. He is on dysphagia diet.    Past Medical History:  Diagnosis Date  . Abnormality of gait 07/04/2012  . Actinic keratoses   . Arthritis   . Benign prostatic hyperplasia   . BPH (benign prostatic hyperplasia)   . CAD (coronary artery disease) of artery bypass graft 12/02/2013  . CAD (coronary artery disease) of bypass graft    a. s/p CABG 1991 without subsequent interventions.  . Chorioretinitis   . Chronic diastolic CHF (congestive heart failure) ([place])   . Colon cancer ([place])    a. admitted to [facility] 12/07/14 with profound iron deficiency anemia Hgb 4.5, melena, with new diagnosis of invasive adenocarcinoma of colon s/p lap R colectomy on 12/15/14.  . Dementia   . Diverticulitis   . Dizziness and giddiness 07/04/2012  . Edema   . Essential hypertension   . Hereditary and idiopathic peripheral neuropathy 07/04/2012   a history of chronic sensory polyneuropathy of undetermined origin with abnormality of gait. He is walking with a rolling walker. Last MRA of the head January 2013 shows no significant stenosis, MRA of the neck shows mild stenosis of the proximal left internal carotid artery.    Marland Kitchen History of atrial fibrillation   . HLD (hyperlipidemia)   . HTN (hypertension) 12/01/2013  . Hyperlipidemia   . Hypothyroidism 12/01/2013  . Hypothyroidism   . 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 Internal carotid artery  stenosis    left  . Iron deficiency anemia    a. 11/2014 - Hgb 4.5 in setting of newly dx colon CA.   . Memory loss   . Mild left ventricular hypertrophy   . Neuromuscular disorder ([place])   . PAF (paroxysmal atrial fibrillation) ([place])    a. Not on anticoag due to bleeding and fall risk.  . Panuveitis   .  Peripheral autonomic neuropathy in disorders classified elsewhere(337.1) 07/04/2012  . Prostatitis, acute    1991  . Retinitis   . Stroke ([place])   . Thyroid disease   . Transient cerebral ischemia 07/04/2012   a posterior circulation TIA in October 2009.    . 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 APPENDECTOMY    . CARDIAC CATHETERIZATION    . COLON RESECTION N/A 12/15/2014   Procedure: Laparoscopic partial colectomy;  Surgeon: Autumn Messing III, MD;  Location: [facility] ORS;  Service: General;  Laterality: N/A;  . COLONOSCOPY WITH PROPOFOL N/A 12/12/2014   Procedure: COLONOSCOPY WITH PROPOFOL;  Surgeon: Gatha Mayer, MD;  Location: [facility] ENDOSCOPY;  Service: Endoscopy;  Laterality: N/A;  . CORONARY ARTERY BYPASS GRAFT  1991   grafts to LAD and RCA  . CORONARY ARTERY BYPASS GRAFT  1991    Allergies  Allergen Reactions  . Sulfa Antibiotics Other (See Comments)    unknown  . Ativan [Lorazepam] Anxiety    Outpatient Encounter Medications as of 01/17/2017  Medication Sig  . acetaminophen (TYLENOL) 325 MG tablet Take 650 mg by mouth 2 (two) times daily. Take 2 every 6 hours as needed for mild pain  . amiodarone (PACERONE) 200 MG tablet Take 1 tablet (200 mg total) by mouth daily.  . Artificial Tear Ointment (REFRESH LACRI-LUBE) OINT Apply to eye at bedtime. Both eyes.  . Calcium Carb-Cholecalciferol (CALCIUM-VITAMIN D) 600-400 MG-UNIT TABS Take 1 tablet by mouth 2 (two) times daily.  . carvedilol (COREG) 3.125 MG tablet Take 1 tablet (3.125 mg total) by mouth 2 (two) times daily with a meal.  . levothyroxine (SYNTHROID, LEVOTHROID) 150 MCG tablet Take 150 mcg by mouth daily before breakfast.  . Melatonin 5 MG TABS Take by mouth. Give 1 tablet 1/2 hour before bedtime.  . polyethylene glycol (MIRALAX / GLYCOLAX) packet Take 17 g by mouth daily as needed.   . tamsulosin (FLOMAX) 0.4 MG CAPS capsule Take 0.8 mg by mouth daily.   . traZODone  (DESYREL) 150 MG tablet Take 75 mg by mouth at bedtime.   No facility-administered encounter medications on file as of 01/17/2017.     Review of Systems  Unable to perform ROS: Dementia    Immunization History  Administered Date(s) Administered  . Influenza-Unspecified 12/22/2013, 11/09/2014, 12/05/2015  . PPD Test 01/03/2015  . Pneumococcal Polysaccharide-23 12/02/1993  . Tdap 01/08/2013  . Zoster 12/03/2007   Pertinent  Health Maintenance Due  Topic Date Due  . INFLUENZA VACCINE  09/18/2016  . PNA vac Low Risk Adult (2 of 2 - PCV13) 02/18/2017 (Originally 12/03/1994)   Fall Risk  10/24/2016 02/10/2015 06/27/2014  Falls in the past year? No Yes No  Number falls in past yr: - 1 -  Injury with Fall? - No -  Risk for fall due to : - History of fall(s) Impaired balance/gait  Follow up - Falls evaluation completed -   Functional Status Survey:    Vitals:   01/17/17 1356  BP: 120/60  Pulse: 70  Resp: 18  Temp: 97.8 F (36.6 C)  SpO2: 96%  Weight: 186 lb (84.4 kg)  Height: 6' (1.829 m)   Body mass index is 25.23 kg/m.   Wt Readings from Last 3 Encounters:  01/17/17 186 lb (84.4 kg)  01/02/17 186 lb (84.4 kg)  12/27/16 186 lb (84.4 kg)   Physical Exam  Constitutional: He appears well-developed and well-nourished. No distress.  HENT:  Head: Normocephalic and atraumatic.  Mouth/Throat: Oropharynx is clear and moist. No oropharyngeal exudate.  Eyes: Conjunctivae and EOM are normal. Pupils are equal, round, and reactive to light. Right eye exhibits no discharge.  Neck: Neck supple.  Cardiovascular: Normal rate, regular rhythm and intact distal pulses.  Pulmonary/Chest: Effort normal and breath sounds normal. No respiratory distress. He has no wheezes. He has no rales.  Abdominal: Soft. Bowel sounds are normal. There is no tenderness. There is no guarding.  Musculoskeletal: He exhibits edema.  Can move all 4 extremities, unsteady gait, trace leg edema  Lymphadenopathy:      He has no cervical adenopathy.  Neurological: He is alert.  Oriented to self only  Skin: Skin is warm and dry. He is not diaphoretic. There is erythema.  Mild erythema to both legs with chronic skin changes. Varicose veins. Scabs on both feet and legs. No signs of infection. Erythema to groin and perineum  Psychiatric: He has a normal mood and affect.    Labs reviewed: Recent Labs    03/19/16 07/30/16 09/02/16  NA 141 140 142  K 3.9 4.0 3.9  BUN 19 25* 22*  CREATININE 1.2 1.2 1.1   Recent Labs    03/19/16 07/30/16  AST 21 13*  ALT 16 12  ALKPHOS 83 57   Recent Labs    03/19/16 06/06/16 09/02/16  WBC 4.4 4.8 13.0  HGB 12.8* 13.0* 12.0*  HCT 39* 37* 35*  PLT 213 204 174   Lab Results  Component Value Date   TSH 3.01 09/03/2016   Lab Results  Component Value Date   HGBA1C 5.9 (H) 12/15/2014   Lab Results  Component Value Date   CHOL 95 12/26/2015   HDL 42 12/26/2015   LDLCALC 46 12/26/2015   TRIG 37 (A) 12/26/2015   CHOLHDL 2.6 12/07/2014    Significant Diagnostic Results in last 30 days:  Dg Tibia/fibula Right  Result Date: 01/01/2017 CLINICAL DATA:  Fall from a wheelchair EXAM: RIGHT TIBIA AND FIBULA - 2 VIEW COMPARISON:  None. FINDINGS: Possible subtle nondisplaced distal fibular fracture on the obliques view. The proximal tibia and fibula appear intact. Moderate degenerative changes at the right knee. Small effusion. Clips within the medial soft tissues. IMPRESSION: Possible subtle nondisplaced distal fibular fracture. Electronically Signed   By: Donavan Foil M.D.   On: 01/01/2017 21:58   Dg Ankle Complete Right  Result Date: 01/01/2017 CLINICAL DATA:  Fall from wheelchair, tender over the lateral ankle EXAM: RIGHT ANKLE - COMPLETE 3+ VIEW COMPARISON:  None. FINDINGS: Osteopenia. Soft tissue swelling laterally. Possible lucency on the lateral view within the distal fibular shaft and metaphysis. IMPRESSION: Questionable subtle nondisplaced fracture lucency  on one view through the distal shaft and metaphysis of the fibula. Electronically Signed   By: Donavan Foil M.D.   On: 01/01/2017 21:59   Ct Head Wo Contrast  Result Date: 01/01/2017 CLINICAL DATA:  Status post unwitnessed fall from wheelchair, with severe confusion. Headache. EXAM: CT HEAD WITHOUT CONTRAST TECHNIQUE: Contiguous axial images were obtained from the base of the skull through the vertex  without intravenous contrast. COMPARISON:  MRI of the brain performed 01/29/2013 FINDINGS: Brain: No evidence of acute infarction, hemorrhage, hydrocephalus, extra-axial collection or mass lesion/mass effect. Prominence of the ventricles and sulci reflects moderate cortical volume loss. Cerebellar atrophy is noted. Mild periventricular white matter change likely reflects small vessel ischemic microangiopathy. The brainstem and fourth ventricle are within normal limits. The basal ganglia are unremarkable in appearance. The cerebral hemispheres demonstrate grossly normal gray-white differentiation. No mass effect or midline shift is seen. Vascular: No hyperdense vessel or unexpected calcification. Skull: There is no evidence of fracture; visualized osseous structures are unremarkable in appearance. Sinuses/Orbits: The visualized portions of the orbits are within normal limits. The paranasal sinuses and mastoid air cells are well-aerated. Other: No significant soft tissue abnormalities are seen. IMPRESSION: 1. No evidence of traumatic intracranial injury or fracture. 2. Moderate cortical volume loss and scattered small vessel ischemic microangiopathy. Electronically Signed   By: Garald Balding M.D.   On: 01/01/2017 23:25   Ct Tibia Fibula Right Wo Contrast  Result Date: 01/01/2017 CLINICAL DATA:  Status post unwitnessed fall from wheelchair, with right lower extremity pain. EXAM: CT OF THE LOWER RIGHT EXTREMITY WITHOUT CONTRAST TECHNIQUE: Multidetector CT imaging of the right lower extremity was performed  according to the standard protocol. COMPARISON:  Right tibia/fibula radiographs performed earlier today at 9:42 p.m. FINDINGS: Bones/Joint/Cartilage Evaluation is markedly suboptimal due to motion artifact. No definite fracture is seen. Cortical irregularity at the distal fibula at varying locations on each sequence appears to reflect motion artifact. Sclerosis is noted at the lateral tibial plateau. Subcortical cysts are seen at the medial tibial plateau and tibial spine. Marginal osteophytes are noted at the lateral compartment. Tibial spine and wall osteophytes are also seen. Underlying osteopenia is noted. A small knee joint effusion is noted. The cartilage is not well assessed on CT. Ligaments Suboptimally assessed by CT. Muscles and Tendons There is underlying diffuse atrophy of the medial gastrocnemius muscle. Visualized tendons are grossly unremarkable. Soft tissues Soft tissue edema is seen tracking along the right lower extremity. No focal fluid collections are seen. The vasculature is not well assessed without contrast. Scattered vascular calcifications are seen. IMPRESSION: 1. No definite evidence of fracture. Evaluation is markedly suboptimal due to motion artifact. 2. Mild osteoarthritis at the right knee. 3. Small knee joint effusion. 4. Underlying atrophy of the medial gastrocnemius muscle. 5. Soft tissue edema tracking about the right lower extremity. 6. Scattered vascular calcifications seen. Electronically Signed   By: Garald Balding M.D.   On: 01/01/2017 23:56    Assessment/Plan  Unsteady gait Recent fall with ED visit on 01/01/17, negative for bleed and fracture. Remains high fall risk.   Candidal rash To groin and perineum with moisture from Urinary and bowel incontinence. Change diapers and provide incontinence care. Apply nystatin cream bid to groin and perineal area until healed. If worsens or no improvement, reassess.  afib Controlled HR. Continue carvedilol 3.125 mg bid and  amiodarone 200 mg daily. Monitor TSH periodically. Not on anticoagulation with high fall risk.   PVD With chronic skin changes to his lower legs. No signs of infection. No open wound. Few dried scabs. Good dorsalis pedis. Will need skin care, moisturizer, keep legs elevated at rest and add ted hose  Hypothyroidism With him on amiodarone. Check TSH. Continue levothyroxine 150 mcg daily for now.  Lab Results  Component Value Date   TSH 3.01 09/03/2016     Family/ staff Communication: reviewed care plan with patient  and charge nurse.    Labs/tests ordered:  TSH   Blanchie Serve, MD Internal Medicine [facility] [address] [place], [place] [address] Cell Phone (Monday-Friday 8 am - 5 pm): [phone] On Call: [phone] and follow prompts after 5 pm and on weekends Office Phone: [phone] Office Fax: [phone]

## 2017-01-23 DIAGNOSIS — E032 Hypothyroidism due to medicaments and other exogenous substances: Secondary | ICD-10-CM | POA: Diagnosis not present

## 2017-01-23 LAB — TSH: TSH: 0.93 (ref <=5.90)

## 2017-01-24 ENCOUNTER — Other Ambulatory Visit: Payer: Self-pay | Admitting: *Deleted

## 2017-02-03 DIAGNOSIS — H3093 Unspecified chorioretinal inflammation, bilateral: Secondary | ICD-10-CM | POA: Diagnosis not present

## 2017-02-03 DIAGNOSIS — R609 Edema, unspecified: Secondary | ICD-10-CM | POA: Diagnosis not present

## 2017-02-03 DIAGNOSIS — M25569 Pain in unspecified knee: Secondary | ICD-10-CM | POA: Diagnosis not present

## 2017-02-03 DIAGNOSIS — L570 Actinic keratosis: Secondary | ICD-10-CM | POA: Diagnosis not present

## 2017-02-03 DIAGNOSIS — M6281 Muscle weakness (generalized): Secondary | ICD-10-CM | POA: Diagnosis not present

## 2017-02-03 DIAGNOSIS — G9009 Other idiopathic peripheral autonomic neuropathy: Secondary | ICD-10-CM | POA: Diagnosis not present

## 2017-02-03 DIAGNOSIS — G459 Transient cerebral ischemic attack, unspecified: Secondary | ICD-10-CM | POA: Diagnosis not present

## 2017-02-03 DIAGNOSIS — R2681 Unsteadiness on feet: Secondary | ICD-10-CM | POA: Diagnosis not present

## 2017-02-03 DIAGNOSIS — I4891 Unspecified atrial fibrillation: Secondary | ICD-10-CM | POA: Diagnosis not present

## 2017-02-03 DIAGNOSIS — R1312 Dysphagia, oropharyngeal phase: Secondary | ICD-10-CM | POA: Diagnosis not present

## 2017-02-03 DIAGNOSIS — I10 Essential (primary) hypertension: Secondary | ICD-10-CM | POA: Diagnosis not present

## 2017-02-03 DIAGNOSIS — R293 Abnormal posture: Secondary | ICD-10-CM | POA: Diagnosis not present

## 2017-02-03 DIAGNOSIS — R531 Weakness: Secondary | ICD-10-CM | POA: Diagnosis not present

## 2017-02-04 DIAGNOSIS — R2681 Unsteadiness on feet: Secondary | ICD-10-CM | POA: Diagnosis not present

## 2017-02-04 DIAGNOSIS — I4891 Unspecified atrial fibrillation: Secondary | ICD-10-CM | POA: Diagnosis not present

## 2017-02-04 DIAGNOSIS — R293 Abnormal posture: Secondary | ICD-10-CM | POA: Diagnosis not present

## 2017-02-04 DIAGNOSIS — H3093 Unspecified chorioretinal inflammation, bilateral: Secondary | ICD-10-CM | POA: Diagnosis not present

## 2017-02-04 DIAGNOSIS — G459 Transient cerebral ischemic attack, unspecified: Secondary | ICD-10-CM | POA: Diagnosis not present

## 2017-02-04 DIAGNOSIS — G9009 Other idiopathic peripheral autonomic neuropathy: Secondary | ICD-10-CM | POA: Diagnosis not present

## 2017-02-04 DIAGNOSIS — L570 Actinic keratosis: Secondary | ICD-10-CM | POA: Diagnosis not present

## 2017-02-04 DIAGNOSIS — M6281 Muscle weakness (generalized): Secondary | ICD-10-CM | POA: Diagnosis not present

## 2017-02-04 DIAGNOSIS — I10 Essential (primary) hypertension: Secondary | ICD-10-CM | POA: Diagnosis not present

## 2017-02-04 DIAGNOSIS — R609 Edema, unspecified: Secondary | ICD-10-CM | POA: Diagnosis not present

## 2017-02-04 DIAGNOSIS — R1312 Dysphagia, oropharyngeal phase: Secondary | ICD-10-CM | POA: Diagnosis not present

## 2017-02-04 DIAGNOSIS — M25569 Pain in unspecified knee: Secondary | ICD-10-CM | POA: Diagnosis not present

## 2017-02-04 DIAGNOSIS — R531 Weakness: Secondary | ICD-10-CM | POA: Diagnosis not present

## 2017-02-05 DIAGNOSIS — R531 Weakness: Secondary | ICD-10-CM | POA: Diagnosis not present

## 2017-02-06 DIAGNOSIS — L570 Actinic keratosis: Secondary | ICD-10-CM | POA: Diagnosis not present

## 2017-02-06 DIAGNOSIS — G9009 Other idiopathic peripheral autonomic neuropathy: Secondary | ICD-10-CM | POA: Diagnosis not present

## 2017-02-06 DIAGNOSIS — R2681 Unsteadiness on feet: Secondary | ICD-10-CM | POA: Diagnosis not present

## 2017-02-06 DIAGNOSIS — I10 Essential (primary) hypertension: Secondary | ICD-10-CM | POA: Diagnosis not present

## 2017-02-06 DIAGNOSIS — R609 Edema, unspecified: Secondary | ICD-10-CM | POA: Diagnosis not present

## 2017-02-06 DIAGNOSIS — I4891 Unspecified atrial fibrillation: Secondary | ICD-10-CM | POA: Diagnosis not present

## 2017-02-06 DIAGNOSIS — M25569 Pain in unspecified knee: Secondary | ICD-10-CM | POA: Diagnosis not present

## 2017-02-06 DIAGNOSIS — R1312 Dysphagia, oropharyngeal phase: Secondary | ICD-10-CM | POA: Diagnosis not present

## 2017-02-06 DIAGNOSIS — M6281 Muscle weakness (generalized): Secondary | ICD-10-CM | POA: Diagnosis not present

## 2017-02-06 DIAGNOSIS — G459 Transient cerebral ischemic attack, unspecified: Secondary | ICD-10-CM | POA: Diagnosis not present

## 2017-02-06 DIAGNOSIS — R531 Weakness: Secondary | ICD-10-CM | POA: Diagnosis not present

## 2017-02-06 DIAGNOSIS — R293 Abnormal posture: Secondary | ICD-10-CM | POA: Diagnosis not present

## 2017-02-06 DIAGNOSIS — H3093 Unspecified chorioretinal inflammation, bilateral: Secondary | ICD-10-CM | POA: Diagnosis not present

## 2017-02-07 DIAGNOSIS — H3093 Unspecified chorioretinal inflammation, bilateral: Secondary | ICD-10-CM | POA: Diagnosis not present

## 2017-02-07 DIAGNOSIS — I4891 Unspecified atrial fibrillation: Secondary | ICD-10-CM | POA: Diagnosis not present

## 2017-02-07 DIAGNOSIS — R609 Edema, unspecified: Secondary | ICD-10-CM | POA: Diagnosis not present

## 2017-02-07 DIAGNOSIS — L570 Actinic keratosis: Secondary | ICD-10-CM | POA: Diagnosis not present

## 2017-02-07 DIAGNOSIS — G459 Transient cerebral ischemic attack, unspecified: Secondary | ICD-10-CM | POA: Diagnosis not present

## 2017-02-07 DIAGNOSIS — R1312 Dysphagia, oropharyngeal phase: Secondary | ICD-10-CM | POA: Diagnosis not present

## 2017-02-07 DIAGNOSIS — I10 Essential (primary) hypertension: Secondary | ICD-10-CM | POA: Diagnosis not present

## 2017-02-07 DIAGNOSIS — R2681 Unsteadiness on feet: Secondary | ICD-10-CM | POA: Diagnosis not present

## 2017-02-07 DIAGNOSIS — M25569 Pain in unspecified knee: Secondary | ICD-10-CM | POA: Diagnosis not present

## 2017-02-07 DIAGNOSIS — G9009 Other idiopathic peripheral autonomic neuropathy: Secondary | ICD-10-CM | POA: Diagnosis not present

## 2017-02-07 DIAGNOSIS — R293 Abnormal posture: Secondary | ICD-10-CM | POA: Diagnosis not present

## 2017-02-07 DIAGNOSIS — R531 Weakness: Secondary | ICD-10-CM | POA: Diagnosis not present

## 2017-02-07 DIAGNOSIS — M6281 Muscle weakness (generalized): Secondary | ICD-10-CM | POA: Diagnosis not present

## 2017-02-12 ENCOUNTER — Encounter: Payer: Self-pay | Admitting: Nurse Practitioner

## 2017-02-12 ENCOUNTER — Non-Acute Institutional Stay (SKILLED_NURSING_FACILITY): Payer: Medicare Other | Admitting: Nurse Practitioner

## 2017-02-12 DIAGNOSIS — I10 Essential (primary) hypertension: Secondary | ICD-10-CM | POA: Diagnosis not present

## 2017-02-12 DIAGNOSIS — G301 Alzheimer's disease with late onset: Secondary | ICD-10-CM

## 2017-02-12 DIAGNOSIS — G459 Transient cerebral ischemic attack, unspecified: Secondary | ICD-10-CM | POA: Diagnosis not present

## 2017-02-12 DIAGNOSIS — R627 Adult failure to thrive: Secondary | ICD-10-CM | POA: Diagnosis not present

## 2017-02-12 DIAGNOSIS — M25569 Pain in unspecified knee: Secondary | ICD-10-CM | POA: Diagnosis not present

## 2017-02-12 DIAGNOSIS — H3093 Unspecified chorioretinal inflammation, bilateral: Secondary | ICD-10-CM | POA: Diagnosis not present

## 2017-02-12 DIAGNOSIS — L570 Actinic keratosis: Secondary | ICD-10-CM | POA: Diagnosis not present

## 2017-02-12 DIAGNOSIS — R2681 Unsteadiness on feet: Secondary | ICD-10-CM | POA: Diagnosis not present

## 2017-02-12 DIAGNOSIS — M6281 Muscle weakness (generalized): Secondary | ICD-10-CM | POA: Diagnosis not present

## 2017-02-12 DIAGNOSIS — N400 Enlarged prostate without lower urinary tract symptoms: Secondary | ICD-10-CM | POA: Diagnosis not present

## 2017-02-12 DIAGNOSIS — I4891 Unspecified atrial fibrillation: Secondary | ICD-10-CM | POA: Diagnosis not present

## 2017-02-12 DIAGNOSIS — E039 Hypothyroidism, unspecified: Secondary | ICD-10-CM

## 2017-02-12 DIAGNOSIS — Z8679 Personal history of other diseases of the circulatory system: Secondary | ICD-10-CM | POA: Diagnosis not present

## 2017-02-12 DIAGNOSIS — F5101 Primary insomnia: Secondary | ICD-10-CM

## 2017-02-12 DIAGNOSIS — F0280 Dementia in other diseases classified elsewhere without behavioral disturbance: Secondary | ICD-10-CM

## 2017-02-12 DIAGNOSIS — R1312 Dysphagia, oropharyngeal phase: Secondary | ICD-10-CM | POA: Diagnosis not present

## 2017-02-12 DIAGNOSIS — R293 Abnormal posture: Secondary | ICD-10-CM | POA: Diagnosis not present

## 2017-02-12 DIAGNOSIS — R531 Weakness: Secondary | ICD-10-CM | POA: Diagnosis not present

## 2017-02-12 DIAGNOSIS — G9009 Other idiopathic peripheral autonomic neuropathy: Secondary | ICD-10-CM | POA: Diagnosis not present

## 2017-02-12 DIAGNOSIS — R609 Edema, unspecified: Secondary | ICD-10-CM | POA: Diagnosis not present

## 2017-02-12 NOTE — Progress Notes (Signed)
Location:  [facility] Room Number: 105 Place of Service:  SNF (31) Provider:  Lennin Osmond, [place]  NP  Blanchie Serve, MD  Patient Care Team: Blanchie Serve, MD as PCP - General (Internal Medicine) Zebedee Iba., MD as Referring Physician (Ophthalmology) Darlin Coco, MD as Consulting Physician (Cardiology) Garvin Fila, MD as Consulting Physician (Neurology) Festus Aloe, MD as Consulting Physician (Urology) Marilynne Halsted, MD as Referring Physician (Ophthalmology) Lavonna Monarch, MD as Consulting Physician (Dermatology) Gerarda Fraction, MD as Referring Physician (Ophthalmology) Copelan Maultsby X, NP as Nurse Practitioner (Internal Medicine)  Extended Emergency Contact Information Primary Emergency Contact: Farr,Laura Address: [address] [place] [address] Johnnette Litter of [place] Phone: [phone] Mobile Phone: [phone] Relation: Daughter Secondary Emergency Contact: Genia Plants States of [place] Phone: [phone] Relation: Daughter  Code Status:  DNR Goals of care: Advanced Directive information Advanced Directives 02/12/2017  Does Patient Have a Medical Advance Directive? No  Type of Advance Directive Out of facility DNR (pink MOST or yellow form)  Does patient want to make changes to medical advance directive? No - Patient declined  Would patient like information on creating a medical advance directive? No - Patient declined  Pre-existing out of facility DNR order (yellow form or pink MOST form) Yellow form placed in chart (order not valid for inpatient use)     Chief Complaint  Patient presents with  . Medical Management of Chronic Issues    HPI:  Pt is a 83 y.o. male seen today for evaluation of his chronic medical conditions. The patient has history of dementia, declined gradually, he is total dependent of ADLs. History of Afib, heart rate is in 48 upon my visit today, taking Carvedilol  3.125mg  bid and Amiodarone 200mg . Hypothyroidism, taking Levothyroxine 155mcg po daily. Last TSH 0.83 01/23/17.  Incontinent urine, no urinary retention, on Tamsulosin 0.4mg . He has restless at night, on Trazodone 75mg  daily.    Past Medical History:  Diagnosis Date  . Abnormality of gait 07/04/2012  . Actinic keratoses   . Arthritis   . Benign prostatic hyperplasia   . BPH (benign prostatic hyperplasia)   . CAD (coronary artery disease) of artery bypass graft 12/02/2013  . CAD (coronary artery disease) of bypass graft    a. s/p CABG 1991 without subsequent interventions.  . Chorioretinitis   . Chronic diastolic CHF (congestive heart failure) ([place])   . Colon cancer ([employer])    a. admitted [facility] 12/07[facility]14 with profound iron deficiency anemia Hgb 4.5, melena, with new diagnosis of invasive adenocarcinoma of colon s/p lap R colectomy on 12/15/14.  . Dementia   . Diverticulitis   . Dizziness and giddiness 07/04/2012  . Edema   . Essential hypertension   . Hereditary and idiopathic peripheral neuropathy 07/04/2012   a history of chronic sensory polyneuropathy of undetermined origin with abnormality of gait. He is walking with a rolling walker. Last MRA of the head January 2013 shows no significant stenosis, MRA of the neck shows mild stenosis of the proximal left internal carotid artery.    Marland Kitchen History of atrial fibrillation   . HLD (hyperlipidemia)   . HTN (hypertension) 12/01/2013  . Hyperlipidemia   . Hypothyroidism 12/01/2013  . Hypothyroidism   . 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 Internal carotid artery stenosis    left  . Iron deficiency anemia    a. 11/2014 - Hgb 4.5 in setting of newly  dx colon CA.   . Memory loss   . Mild left ventricular hypertrophy   . Neuromuscular disorder ([place])   . PAF (paroxysmal atrial fibrillation) ([place])    a. Not on anticoag due to bleeding and fall risk.  . Panuveitis   . Peripheral autonomic neuropathy in disorders classified  elsewhere(337.1) 07/04/2012  . Prostatitis, acute    1991  . Retinitis   . Stroke ([place])   . Thyroid disease   . Transient cerebral ischemia 07/04/2012   a posterior circulation TIA in October 2009.    . 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 APPENDECTOMY    . CARDIAC CATHETERIZATION    . COLON RESECTION N/A 12/15/2014   Procedure: Laparoscopic partial colectomy;  Surgeon: Autumn Messing III, MD;  Location: [facility] ORS;  Service: General;  Laterality: N/A;  . COLONOSCOPY WITH PROPOFOL N/A 12/12/2014   Procedure: COLONOSCOPY WITH PROPOFOL;  Surgeon: Gatha Mayer, MD;  Location: [facility] ENDOSCOPY;  Service: Endoscopy;  Laterality: N/A;  . CORONARY ARTERY BYPASS GRAFT  1991   grafts to LAD and RCA  . CORONARY ARTERY BYPASS GRAFT  1991    Allergies  Allergen Reactions  . Sulfa Antibiotics Other (See Comments)    unknown  . Ativan [Lorazepam] Anxiety    Outpatient Encounter Medications as of 02/12/2017  Medication Sig  . acetaminophen (TYLENOL) 325 MG tablet Take 650 mg by mouth 2 (two) times daily. Take 2 every 6 hours as needed for mild pain  . amiodarone (PACERONE) 200 MG tablet Take 1 tablet (200 mg total) by mouth daily.  . Artificial Tear Ointment (REFRESH LACRI-LUBE) OINT Apply to eye at bedtime. Both eyes.  . Calcium Carb-Cholecalciferol (CALCIUM-VITAMIN D) 600-400 MG-UNIT TABS Take 1 tablet by mouth 2 (two) times daily.  . carvedilol (COREG) 3.125 MG tablet Take 1 tablet (3.125 mg total) by mouth 2 (two) times daily with a meal.  . levothyroxine (SYNTHROID, LEVOTHROID) 150 MCG tablet Take 150 mcg by mouth daily before breakfast.  . Melatonin 5 MG TABS Take by mouth. Give 1 tablet 1/2 hour before bedtime.  . polyethylene glycol (MIRALAX / GLYCOLAX) packet Take 17 g by mouth daily as needed.   . tamsulosin (FLOMAX) 0.4 MG CAPS capsule Take 0.8 mg by mouth daily.   . traZODone (DESYREL) 150 MG tablet Take 75 mg by mouth at bedtime.     No facility-administered encounter medications on file as of 02/12/2017.    ROS was provided with assistance of staff and the patient's daughter.  Review of Systems  Constitutional: Positive for activity change, appetite change and fatigue. Negative for chills, diaphoresis and fever.  HENT: Positive for hearing loss and trouble swallowing. Negative for congestion and voice change.        Nectar thick liquids  Eyes: Negative for visual disturbance.  Respiratory: Negative for cough, choking, chest tightness and wheezing.   Cardiovascular: Negative for chest pain, palpitations and leg swelling.  Gastrointestinal: Negative for abdominal distention, abdominal pain, constipation, diarrhea, nausea and vomiting.  Genitourinary: Negative for dysuria and urgency.       Incontinent of urine  Musculoskeletal: Positive for gait problem.       No longer ambulatory  Skin: Negative for color change, pallor, rash and wound.  Neurological: Negative for tremors, speech difficulty, weakness and headaches.       Dementia  Psychiatric/Behavioral: Positive for agitation, behavioral problems, confusion and sleep disturbance. Negative for hallucinations. The patient is not  nervous/anxious.     Immunization History  Administered Date(s) Administered  . Influenza-Unspecified 12/22/2013, 11/09/2014, 12/05/2015  . PPD Test 01/03/2015  . Pneumococcal Polysaccharide-23 12/02/1993  . Tdap 01/08/2013  . Zoster 12/03/2007   Pertinent  Health Maintenance Due  Topic Date Due  . INFLUENZA VACCINE  09/18/2016  . PNA vac Low Risk Adult (2 of 2 - PCV13) 02/18/2017 (Originally 12/03/1994)   Fall Risk  10/24/2016 02/10/2015 06/27/2014  Falls in the past year? No Yes No  Number falls in past yr: - 1 -  Injury with Fall? - No -  Risk for fall due to : - History of fall(s) Impaired balance/gait  Follow up - Falls evaluation completed -   Functional Status Survey:    Vitals:   02/12/17 1333  BP: (!) 130/58  Pulse:  70  Resp: 20  Temp: (!) 97.3 F (36.3 C)  Weight: 187 lb 12.8 oz (85.2 kg)  Height: 6' (1.829 m)   Body mass index is 25.47 kg/m. Physical Exam  Constitutional: He appears well-developed and well-nourished.  HENT:  Head: Normocephalic and atraumatic.  Eyes: Conjunctivae and EOM are normal. Pupils are equal, round, and reactive to light.  Neck: Normal range of motion. Neck supple. No JVD present. No thyromegaly present.  Cardiovascular: Regular rhythm.  No murmur heard. HR 40s  Pulmonary/Chest: Effort normal and breath sounds normal. He has no wheezes. He has no rales.  Abdominal: Soft. Bowel sounds are normal. He exhibits no distension. There is no tenderness.  Musculoskeletal: Normal range of motion. He exhibits no edema or tenderness.  No longer ambulatory, sit to stand for transfer.   Neurological: He is alert. He exhibits abnormal muscle tone. Coordination normal.  Oriented to self.   Skin: Skin is warm and dry. No rash noted. No pallor.  Psychiatric: He has a normal mood and affect.  Nursing note reviewed.   Labs reviewed: Recent Labs    03/19/16 07/30/16 09/02/16  NA 141 140 142  K 3.9 4.0 3.9  BUN 19 25* 22*  CREATININE 1.2 1.2 1.1   Recent Labs    03/19/16 07/30/16  AST 21 13*  ALT 16 12  ALKPHOS 83 57   Recent Labs    03/19/16 06/06/16 09/02/16  WBC 4.4 4.8 13.0  HGB 12.8* 13.0* 12.0*  HCT 39* 37* 35*  PLT 213 204 174   Lab Results  Component Value Date   TSH 0.93 01/23/2017   Lab Results  Component Value Date   HGBA1C 5.9 (H) 12/15/2014   Lab Results  Component Value Date   CHOL 95 12/26/2015   HDL 42 12/26/2015   LDLCALC 46 12/26/2015   TRIG 37 (A) 12/26/2015   CHOLHDL 2.6 12/07/2014    Significant Diagnostic Results in last 30 days:  No results found.  Assessment/Plan Alzheimer's disease The patient has history of dementia, declined gradually, he is total dependent of ADLs. Hospice referral.   History of atrial fibrillation   History of Afib, heart rate is in 48 upon my visit today, will discontinue  Amiodarone 200mg , continue Carvedilol 3.125mg  bid, Bp/P daily.   Hypothyroidism Hypothyroidism, continue Levothyroxine 115mcg po daily. Last TSH 0.93 01/23/17.  Benign prostatic hyperplasia   Incontinent urine, no urinary retention, dc Tamsulosin 0.4mg , observe the patient.   Insomnia He has restless at night, continueTrazodone 75mg  daily, dc Melatonin.    Adult failure to thrive Total dependent of ADLs, resides in [facility], simplify meds: Dc Ca/Vit D, Artificial tear ointment  at night-irritating him when applying. Hospice referral.      Family/ staff Communication: plan of care reviewed with the patient, the patient's daughter, and charge nurse.   Labs/tests ordered:  none  Time spend 25 minutes.

## 2017-02-12 NOTE — Assessment & Plan Note (Signed)
He has restless at night, continueTrazodone 75mg  daily, dc Melatonin.

## 2017-02-12 NOTE — Assessment & Plan Note (Signed)
The patient has history of dementia, declined gradually, he is total dependent of ADLs. Hospice referral.

## 2017-02-12 NOTE — Assessment & Plan Note (Signed)
History of Afib, heart rate is in 48 upon my visit today, will discontinue  Amiodarone 200mg , continue Carvedilol 3.125mg  bid, Bp/P daily.

## 2017-02-12 NOTE — Assessment & Plan Note (Signed)
Total dependent of ADLs, resides in [facility], simplify meds: Dc Ca/Vit D, Artificial tear ointment at night-irritating him when applying. Hospice referral.

## 2017-02-12 NOTE — Assessment & Plan Note (Signed)
Hypothyroidism, continue Levothyroxine 13mcg po daily. Last TSH 0.93 01/23/17.

## 2017-02-12 NOTE — Assessment & Plan Note (Signed)
Incontinent urine, no urinary retention, dc Tamsulosin 0.4mg , observe the patient.

## 2017-02-13 DIAGNOSIS — G9009 Other idiopathic peripheral autonomic neuropathy: Secondary | ICD-10-CM | POA: Diagnosis not present

## 2017-02-13 DIAGNOSIS — R1312 Dysphagia, oropharyngeal phase: Secondary | ICD-10-CM | POA: Diagnosis not present

## 2017-02-13 DIAGNOSIS — I4891 Unspecified atrial fibrillation: Secondary | ICD-10-CM | POA: Diagnosis not present

## 2017-02-13 DIAGNOSIS — R609 Edema, unspecified: Secondary | ICD-10-CM | POA: Diagnosis not present

## 2017-02-13 DIAGNOSIS — L570 Actinic keratosis: Secondary | ICD-10-CM | POA: Diagnosis not present

## 2017-02-13 DIAGNOSIS — R293 Abnormal posture: Secondary | ICD-10-CM | POA: Diagnosis not present

## 2017-02-13 DIAGNOSIS — M25569 Pain in unspecified knee: Secondary | ICD-10-CM | POA: Diagnosis not present

## 2017-02-13 DIAGNOSIS — H3093 Unspecified chorioretinal inflammation, bilateral: Secondary | ICD-10-CM | POA: Diagnosis not present

## 2017-02-13 DIAGNOSIS — R2681 Unsteadiness on feet: Secondary | ICD-10-CM | POA: Diagnosis not present

## 2017-02-13 DIAGNOSIS — R531 Weakness: Secondary | ICD-10-CM | POA: Diagnosis not present

## 2017-02-13 DIAGNOSIS — M6281 Muscle weakness (generalized): Secondary | ICD-10-CM | POA: Diagnosis not present

## 2017-02-13 DIAGNOSIS — G459 Transient cerebral ischemic attack, unspecified: Secondary | ICD-10-CM | POA: Diagnosis not present

## 2017-02-13 DIAGNOSIS — I10 Essential (primary) hypertension: Secondary | ICD-10-CM | POA: Diagnosis not present

## 2017-02-14 ENCOUNTER — Encounter: Payer: Self-pay | Admitting: Nurse Practitioner

## 2017-02-14 DIAGNOSIS — R2681 Unsteadiness on feet: Secondary | ICD-10-CM | POA: Diagnosis not present

## 2017-02-14 DIAGNOSIS — L570 Actinic keratosis: Secondary | ICD-10-CM | POA: Diagnosis not present

## 2017-02-14 DIAGNOSIS — M6281 Muscle weakness (generalized): Secondary | ICD-10-CM | POA: Diagnosis not present

## 2017-02-14 DIAGNOSIS — R1310 Dysphagia, unspecified: Secondary | ICD-10-CM | POA: Insufficient documentation

## 2017-02-14 DIAGNOSIS — G9009 Other idiopathic peripheral autonomic neuropathy: Secondary | ICD-10-CM | POA: Diagnosis not present

## 2017-02-14 DIAGNOSIS — I10 Essential (primary) hypertension: Secondary | ICD-10-CM | POA: Diagnosis not present

## 2017-02-14 DIAGNOSIS — M25569 Pain in unspecified knee: Secondary | ICD-10-CM | POA: Diagnosis not present

## 2017-02-14 DIAGNOSIS — H3093 Unspecified chorioretinal inflammation, bilateral: Secondary | ICD-10-CM | POA: Diagnosis not present

## 2017-02-14 DIAGNOSIS — I4891 Unspecified atrial fibrillation: Secondary | ICD-10-CM | POA: Diagnosis not present

## 2017-02-14 DIAGNOSIS — R293 Abnormal posture: Secondary | ICD-10-CM | POA: Diagnosis not present

## 2017-02-14 DIAGNOSIS — R609 Edema, unspecified: Secondary | ICD-10-CM | POA: Diagnosis not present

## 2017-02-14 DIAGNOSIS — G459 Transient cerebral ischemic attack, unspecified: Secondary | ICD-10-CM | POA: Diagnosis not present

## 2017-02-14 DIAGNOSIS — R531 Weakness: Secondary | ICD-10-CM | POA: Diagnosis not present

## 2017-02-14 DIAGNOSIS — R1312 Dysphagia, oropharyngeal phase: Secondary | ICD-10-CM | POA: Diagnosis not present

## 2017-02-17 DIAGNOSIS — H3093 Unspecified chorioretinal inflammation, bilateral: Secondary | ICD-10-CM | POA: Diagnosis not present

## 2017-02-17 DIAGNOSIS — I4891 Unspecified atrial fibrillation: Secondary | ICD-10-CM | POA: Diagnosis not present

## 2017-02-17 DIAGNOSIS — R293 Abnormal posture: Secondary | ICD-10-CM | POA: Diagnosis not present

## 2017-02-17 DIAGNOSIS — R531 Weakness: Secondary | ICD-10-CM | POA: Diagnosis not present

## 2017-02-17 DIAGNOSIS — R609 Edema, unspecified: Secondary | ICD-10-CM | POA: Diagnosis not present

## 2017-02-17 DIAGNOSIS — G9009 Other idiopathic peripheral autonomic neuropathy: Secondary | ICD-10-CM | POA: Diagnosis not present

## 2017-02-17 DIAGNOSIS — M25569 Pain in unspecified knee: Secondary | ICD-10-CM | POA: Diagnosis not present

## 2017-02-17 DIAGNOSIS — G459 Transient cerebral ischemic attack, unspecified: Secondary | ICD-10-CM | POA: Diagnosis not present

## 2017-02-17 DIAGNOSIS — L570 Actinic keratosis: Secondary | ICD-10-CM | POA: Diagnosis not present

## 2017-02-17 DIAGNOSIS — R1312 Dysphagia, oropharyngeal phase: Secondary | ICD-10-CM | POA: Diagnosis not present

## 2017-02-17 DIAGNOSIS — I10 Essential (primary) hypertension: Secondary | ICD-10-CM | POA: Diagnosis not present

## 2017-02-17 DIAGNOSIS — R2681 Unsteadiness on feet: Secondary | ICD-10-CM | POA: Diagnosis not present

## 2017-02-17 DIAGNOSIS — M6281 Muscle weakness (generalized): Secondary | ICD-10-CM | POA: Diagnosis not present

## 2017-02-21 DIAGNOSIS — L602 Onychogryphosis: Secondary | ICD-10-CM | POA: Diagnosis not present

## 2017-02-21 DIAGNOSIS — M79671 Pain in right foot: Secondary | ICD-10-CM | POA: Diagnosis not present

## 2017-02-21 DIAGNOSIS — M79672 Pain in left foot: Secondary | ICD-10-CM | POA: Diagnosis not present

## 2017-02-21 DIAGNOSIS — L84 Corns and callosities: Secondary | ICD-10-CM | POA: Diagnosis not present

## 2017-03-14 ENCOUNTER — Encounter: Payer: Self-pay | Admitting: Internal Medicine

## 2017-03-14 ENCOUNTER — Non-Acute Institutional Stay (SKILLED_NURSING_FACILITY): Payer: Medicare Other | Admitting: Internal Medicine

## 2017-03-14 DIAGNOSIS — I10 Essential (primary) hypertension: Secondary | ICD-10-CM

## 2017-03-14 DIAGNOSIS — M1990 Unspecified osteoarthritis, unspecified site: Secondary | ICD-10-CM

## 2017-03-14 DIAGNOSIS — N183 Chronic kidney disease, stage 3 unspecified: Secondary | ICD-10-CM

## 2017-03-14 DIAGNOSIS — G301 Alzheimer's disease with late onset: Secondary | ICD-10-CM

## 2017-03-14 DIAGNOSIS — F0280 Dementia in other diseases classified elsewhere without behavioral disturbance: Secondary | ICD-10-CM

## 2017-03-14 NOTE — Progress Notes (Signed)
Location:  [facility] Room Number: 105 Place of Service:  SNF [phone]) Provider:  Blanchie Serve MD  Blanchie Serve, MD  Patient Care Team: Blanchie Serve, MD as PCP - General (Internal Medicine) Zebedee Iba., MD as Referring Physician (Ophthalmology) Darlin Coco, MD as Consulting Physician (Cardiology) Garvin Fila, MD as Consulting Physician (Neurology) Festus Aloe, MD as Consulting Physician (Urology) Marilynne Halsted, MD as Referring Physician (Ophthalmology) Lavonna Monarch, MD as Consulting Physician (Dermatology) Gerarda Fraction, MD as Referring Physician (Ophthalmology) Mast, Man X, NP as Nurse Practitioner (Internal Medicine)  Extended Emergency Contact Information Primary Emergency Contact: Farr,Laura Address: [address] [place] [address] Johnnette Litter of [place] Phone: [phone] Mobile Phone: [phone] Relation: Daughter Secondary Emergency Contact: Genia Plants [place] of [place] Phone: [phone] Relation: Daughter  Code Status:  DNR  Goals of care: Advanced Directive information Advanced Directives 03/14/2017  Does Patient Have a Medical Advance Directive? Yes  Type of Advance Directive Out of facility DNR (pink MOST or yellow form)  Does patient want to make changes to medical advance directive? No - Patient declined  Would patient like information on creating a medical advance directive? -  Pre-existing out of facility DNR order (yellow form or pink MOST form) Yellow form placed in chart (order not valid for inpatient use);Pink MOST form placed in chart (order not valid for inpatient use)     Chief Complaint  Patient presents with  . Medical Management of Chronic Issues    Routine Visit     HPI:  Pt is a 84 y.o. male seen today for medical management of chronic diseases.  He denies any concerns. Occasional refusal of care or yelling per staff but redirectable. No other new  concern from nursing. HPI and ROS limited with his advanced dementia. He is under total care. On dysphagia diet. Is out of bed with hoyer lift and is out of bed daily. No recent fall reported. He is now under palliative care services.    Past Medical History:  Diagnosis Date  . Abnormality of gait 07/04/2012  . Actinic keratoses   . Arthritis   . Benign prostatic hyperplasia   . BPH (benign prostatic hyperplasia)   . CAD (coronary artery disease) of artery bypass graft 12/02/2013  . CAD (coronary artery disease) of bypass graft    a. s/p CABG 1991 without subsequent interventions.  . Chorioretinitis   . Chronic diastolic CHF (congestive heart failure) ([place])   . Colon cancer ([place])    a. admitted to [facility] 12/07/14 with profound iron deficiency anemia Hgb 4.5, melena, with new diagnosis of invasive adenocarcinoma of colon s/p lap R colectomy on 12/15/14.  . Dementia   . Diverticulitis   . Dizziness and giddiness 07/04/2012  . Edema   . Essential hypertension   . Hereditary and idiopathic peripheral neuropathy 07/04/2012   a history of chronic sensory polyneuropathy of undetermined origin with abnormality of gait. He is walking with a rolling walker. Last MRA of the head January 2013 shows no significant stenosis, MRA of the neck shows mild stenosis of the proximal left internal carotid artery.    Marland Kitchen History of atrial fibrillation   . HLD (hyperlipidemia)   . HTN (hypertension) 12/01/2013  . Hyperlipidemia   . Hypothyroidism 12/01/2013  . Hypothyroidism   . 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 Internal carotid artery stenosis    left  . Iron deficiency  anemia    a. 11/2014 - Hgb 4.5 in setting of newly dx colon CA.   . Memory loss   . Mild left ventricular hypertrophy   . Neuromuscular disorder ([place])   . PAF (paroxysmal atrial fibrillation) ([place])    a. Not on anticoag due to bleeding and fall risk.  . Panuveitis   . Peripheral autonomic neuropathy in disorders  classified elsewhere(337.1) 07/04/2012  . Prostatitis, acute    1991  . Retinitis   . Stroke ([place])   . Thyroid disease   . Transient cerebral ischemia 07/04/2012   a posterior circulation TIA in October 2009.    . 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 APPENDECTOMY    . CARDIAC CATHETERIZATION    . COLON RESECTION N/A 12/15/2014   Procedure: Laparoscopic partial colectomy;  Surgeon: Autumn Messing III, MD;  Location: [facility] ORS;  Service: General;  Laterality: N/A;  . COLONOSCOPY WITH PROPOFOL N/A 12/12/2014   Procedure: COLONOSCOPY WITH PROPOFOL;  Surgeon: Gatha Mayer, MD;  Location[facility] END[facility];  Service: Endoscopy;  Laterality: N/A;  . CORONARY ARTERY BYPASS GRAFT  1991   grafts to LAD and RCA  . CORONARY ARTERY BYPASS GRAFT  1991    Allergies  Allergen Reactions  . Sulfa Antibiotics Other (See Comments)    unknown  . Ativan [Lorazepam] Anxiety    Outpatient Encounter Medications as of 03/14/2017  Medication Sig  . acetaminophen (TYLENOL) 325 MG tablet Take 650 mg by mouth 2 (two) times daily. Take 2 every 6 hours as needed for mild pain  . carvedilol (COREG) 3.125 MG tablet Take 1 tablet (3.125 mg total) by mouth 2 (two) times daily with a meal.  . levothyroxine (SYNTHROID, LEVOTHROID) 175 MCG tablet Take 175 mcg by mouth daily before breakfast.  . nystatin cream (MYCOSTATIN) Apply 1 application topically 2 (two) times daily.  . polyethylene glycol (MIRALAX / GLYCOLAX) packet Take 17 g by mouth daily as needed.   . traZODone (DESYREL) 100 MG tablet Take 100 mg by mouth at bedtime.  . [DISCONTINUED] amiodarone (PACERONE) 200 MG tablet Take 1 tablet (200 mg total) by mouth daily.  . [DISCONTINUED] Artificial Tear Ointment (REFRESH LACRI-LUBE) OINT Apply to eye at bedtime. Both eyes.  . [DISCONTINUED] Calcium Carb-Cholecalciferol (CALCIUM-VITAMIN D) 600-400 MG-UNIT TABS Take 1 tablet by mouth 2 (two) times daily.  .  [DISCONTINUED] levothyroxine (SYNTHROID, LEVOTHROID) 150 MCG tablet Take 150 mcg by mouth daily before breakfast.  . [DISCONTINUED] Melatonin 5 MG TABS Take by mouth. Give 1 tablet 1/2 hour before bedtime.  . [DISCONTINUED] tamsulosin (FLOMAX) 0.4 MG CAPS capsule Take 0.8 mg by mouth daily.   . [DISCONTINUED] traZODone (DESYREL) 150 MG tablet Take 75 mg by mouth at bedtime.   No facility-administered encounter medications on file as of 03/14/2017.     Review of Systems  Unable to perform ROS: Dementia    Immunization History  Administered Date(s) Administered  . Influenza-Unspecified 12/22/2013, 11/09/2014, 12/05/2015  . PPD Test 01/03/2015  . Pneumococcal Polysaccharide-23 12/02/1993  . Tdap 01/08/2013  . Zoster 12/03/2007   Pertinent  Health Maintenance Due  Topic Date Due  . PNA vac Low Risk Adult (2 of 2 - PCV13) 12/03/1994  . INFLUENZA VACCINE  09/18/2016   Fall Risk  10/24/2016 02/10/2015 06/27/2014  Falls in the past year? No Yes No  Number falls in past yr: - 1 -  Injury with Fall? - No -  Risk for fall  due to : - History of fall(s) Impaired balance/gait  Follow up - Falls evaluation completed -   Functional Status Survey:    Vitals:   03/14/17 1007  BP: 126/80  Pulse: 60  Resp: 18  Temp: (!) 97.4 F (36.3 C)  TempSrc: Oral  SpO2: 95%  Weight: 183 lb 14.4 oz (83.4 kg)  Height: 6' (1.829 m)   Body mass index is 24.94 kg/m.   Wt Readings from Last 3 Encounters:  03/14/17 183 lb 14.4 oz (83.4 kg)  02/12/17 187 lb 12.8 oz (85.2 kg)  01/17/17 186 lb (84.4 kg)   Physical Exam  Constitutional: He appears well-developed and well-nourished.  HENT:  Head: Normocephalic and atraumatic.  Mouth/Throat: Oropharynx is clear and moist.  Eyes: Conjunctivae are normal. Pupils are equal, round, and reactive to light.  Neck: Neck supple.  Cardiovascular: Normal rate and regular rhythm.  Pulmonary/Chest: Effort normal and breath sounds normal. He has no wheezes. He has no  rales.  Abdominal: Soft. Bowel sounds are normal. There is no tenderness.  Musculoskeletal:  Able to move all 4 extremities, unsteady gait, wheelchair for ambulation, trace leg edema  Lymphadenopathy:    He has no cervical adenopathy.  Neurological: He is alert.  Oriented only to self  Skin: Skin is warm and dry. No rash noted. He is not diaphoretic.  Multiple bruises. Chronic skin changes to lower extremities. Varicose veins  Psychiatric: He has a normal mood and affect.    Labs reviewed: Recent Labs    07/30/16 09/02/16 01/07/17  NA 140 142 143  K 4.0 3.9 3.9  BUN 25* 22* 23*  CREATININE 1.2 1.1 1.3   Recent Labs    03/19/16 07/30/16 01/07/17  AST 21 13* 20  ALT 16 12 22   ALKPHOS 83 57 82   Recent Labs    06/06/16 09/02/16 01/07/17  WBC 4.8 13.0 4.1  HGB 13.0* 12.0* 11.2*  HCT 37* 35* 33*  PLT 204 174 165   Lab Results  Component Value Date   TSH 0.93 01/23/2017   Lab Results  Component Value Date   HGBA1C 5.9 (H) 12/15/2014   Lab Results  Component Value Date   CHOL 95 12/26/2015   HDL 42 12/26/2015   LDLCALC 46 12/26/2015   TRIG 37 (A) 12/26/2015   CHOLHDL 2.6 12/07/2014    Significant Diagnostic Results in last 30 days:  No results found.  Assessment/Plan  OA Continue tylenol 650 mg bid with 650 mg q6h prn psain   ckd stage 3 Maintain hydration, continue to monitor  Essential hypertension Controlled BP overall on review with one low reading of 100/58. Continue coreg 3.125 mg bid  Alzheimer's disease Advanced, continue with total care, goal of care is for comfort measures, followed by palliative services. Fall precautions and pressure ulcer prophylaxis in place. Supportive care.    Family/ staff Communication: ireviewed care plan with patient and nursing supervisor   Labs/tests ordered:  none  Blanchie Serve, MD Internal Medicine [place], [place] [address] Cell Phone  (Monday-Friday 8 am - 5 pm): [phone] On Call: [phone] and follow prompts after 5 pm and on weekends Office Phone: [phone] Office Fax: [phone]

## 2017-03-31 ENCOUNTER — Non-Acute Institutional Stay (SKILLED_NURSING_FACILITY): Payer: Medicare Other | Admitting: Nurse Practitioner

## 2017-03-31 ENCOUNTER — Encounter: Payer: Self-pay | Admitting: Nurse Practitioner

## 2017-03-31 DIAGNOSIS — S40211A Abrasion of right shoulder, initial encounter: Secondary | ICD-10-CM | POA: Diagnosis not present

## 2017-03-31 DIAGNOSIS — G301 Alzheimer's disease with late onset: Secondary | ICD-10-CM | POA: Diagnosis not present

## 2017-03-31 DIAGNOSIS — F0280 Dementia in other diseases classified elsewhere without behavioral disturbance: Secondary | ICD-10-CM | POA: Diagnosis not present

## 2017-03-31 DIAGNOSIS — W19XXXD Unspecified fall, subsequent encounter: Secondary | ICD-10-CM | POA: Diagnosis not present

## 2017-03-31 NOTE — Assessment & Plan Note (Addendum)
history of dementia, he is agitated and restless at times,unable to provided HPI, but able to voice pain, he is lacking of safety awareness. He is mechanical lift for transfer, wheelchair dependent. Close supervision for safety. Continue Hospice service for comfort measures.

## 2017-03-31 NOTE — Assessment & Plan Note (Signed)
the right shoulder bruise 03/28/17, no decreased ROM upon my visit, he was able to put his right hand behind head.

## 2017-03-31 NOTE — Progress Notes (Signed)
Location:  [facility] Room Number: 105 Place of Service:  SNF (31) Provider:  Vendetta Pittinger, [place]  NP  Blanchie Serve, MD  Patient Care Team: Blanchie Serve, MD as PCP - General (Internal Medicine) Zebedee Iba., MD as Referring Physician (Ophthalmology) Darlin Coco, MD as Consulting Physician (Cardiology) Garvin Fila, MD as Consulting Physician (Neurology) Festus Aloe, MD as Consulting Physician (Urology) Marilynne Halsted, MD as Referring Physician (Ophthalmology) Lavonna Monarch, MD as Consulting Physician (Dermatology) Gerarda Fraction, MD as Referring Physician (Ophthalmology) Ausencio Vaden X, NP as Nurse Practitioner (Internal Medicine)  Extended Emergency Contact Information Primary Emergency Contact: Farr,Laura Address: [address] [place] [place] [address] Johnnette Litter of [place] Phone: [phone] Mobile Phone: [phone] Relation: Daughter Secondary Emergency Contact: Genia Plants States of [place] Phone: [phone] Relation: Daughter  Code Status:  DNR Goals of care: Advanced Directive information Advanced Directives 03/31/2017  Does Patient Have a Medical Advance Directive? Yes  Type of Advance Directive Out of facility DNR (pink MOST or yellow form)  Does patient want to make changes to medical advance directive? No - Patient declined  Would patient like information on creating a medical advance directive? No - Patient declined  Pre-existing out of facility DNR order (yellow form or pink MOST form) Yellow form placed in chart (order not valid for inpatient use)     Chief Complaint  Patient presents with  . Acute Visit    Bruise to (Rt) shoulder    HPI:  Pt is a 84 y.o. male seen today for an acute visit for the right shoulder bruise 03/28/17, no decreased ROM upon my visit, he was able to put his right hand behind head, history of dementia, he is agitated and restless at times,unable to provided  HPI, but able to voice pain, he is lacking of safety awareness. Fell 03/30/17, found lying on the floor in his room, with no noted injury.    Past Medical History:  Diagnosis Date  . Abnormality of gait 07/04/2012  . Actinic keratoses   . Arthritis   . Benign prostatic hyperplasia   . BPH (benign prostatic hyperplasia)   . CAD (coronary artery disease) of artery bypass graft 12/02/2013  . CAD (coronary artery disease) of bypass graft    a. s/p CABG 1991 without subsequent interventions.  . Chorioretinitis   . Chronic diastolic CHF (congestive heart failure) ([place])   . Colon cancer ([place])    a. admitted to [facility] 12/07/14 with profound iron deficiency anemia Hgb 4.5, melena, with new diagnosis of invasive adenocarcinoma of colon s/p lap R colectomy on 12/15/14.  . Dementia   . Diverticulitis   . Dizziness and giddiness 07/04/2012  . Edema   . Essential hypertension   . Hereditary and idiopathic peripheral neuropathy 07/04/2012   a history of chronic sensory polyneuropathy of undetermined origin with abnormality of gait. He is walking with a rolling walker. Last MRA of the head January 2013 shows no significant stenosis, MRA of the neck shows mild stenosis of the proximal left internal carotid artery.    Marland Kitchen History of atrial fibrillation   . HLD (hyperlipidemia)   . HTN (hypertension) 12/01/2013  . Hyperlipidemia   . Hypothyroidism 12/01/2013  . Hypothyroidism   . 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 Internal carotid artery stenosis    left  . Iron deficiency anemia    a. 11/2014 - Hgb 4.5 in setting of newly  dx colon CA.   . Memory loss   . Mild left ventricular hypertrophy   . Neuromuscular disorder ([place])   . PAF (paroxysmal atrial fibrillation) ([place])    a. Not on anticoag due to bleeding and fall risk.  . Panuveitis   . Peripheral autonomic neuropathy in disorders classified elsewhere(337.1) 07/04/2012  . Prostatitis, acute    1991  . Retinitis   . Stroke ([place])   .  Thyroid disease   . Transient cerebral ischemia 07/04/2012   a posterior circulation TIA in October 2009.    . 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 APPENDECTOMY    . CARDIAC CATHETERIZATION    . COLON RESECTION N/A 12/15/2014   Procedure: Laparoscopic partial colectomy;  Surgeon: Autumn Messing III, MD;  Location: [facility];  Service: General;  Laterality: N/A;  . COLONOSCOPY WITH PROPOFOL N/A 12/12/2014   Procedure: COLONOSCOPY WITH PROPOFOL;  Surgeon: Gatha Mayer, MD;  Location: [facility];  Service: Endoscopy;  Laterality: N/A;  . CORONARY ARTERY BYPASS GRAFT  1991   grafts to LAD and RCA  . CORONARY ARTERY BYPASS GRAFT  1991    Allergies  Allergen Reactions  . Sulfa Antibiotics Other (See Comments)    unknown  . Ativan [Lorazepam] Anxiety    Outpatient Encounter Medications as of 03/31/2017  Medication Sig  . acetaminophen (TYLENOL) 325 MG tablet Take 650 mg by mouth 2 (two) times daily. Take 2 every 6 hours as needed for mild pain  . carvedilol (COREG) 3.125 MG tablet Take 1 tablet (3.125 mg total) by mouth 2 (two) times daily with a meal.  . levothyroxine (SYNTHROID, LEVOTHROID) 175 MCG tablet Take 175 mcg by mouth daily before breakfast.  . nystatin cream (MYCOSTATIN) Apply 1 application topically 2 (two) times daily.  . polyethylene glycol (MIRALAX / GLYCOLAX) packet Take 17 g by mouth daily as needed.   . traZODone (DESYREL) 100 MG tablet Take 100 mg by mouth at bedtime.   No facility-administered encounter medications on file as of 03/31/2017.    ROS was provided with assistance of staff Review of Systems  Constitutional: Negative for appetite change, chills, diaphoresis and fatigue.  HENT: Positive for hearing loss and trouble swallowing. Negative for congestion and voice change.   Eyes: Negative for visual disturbance.  Respiratory: Negative for cough, choking, shortness of breath and wheezing.     Cardiovascular: Positive for leg swelling. Negative for chest pain and palpitations.  Gastrointestinal: Negative for abdominal distention, abdominal pain, constipation, diarrhea, nausea and vomiting.  Genitourinary: Negative for difficulty urinating, dysuria and urgency.       Incontinent of urine   Musculoskeletal: Positive for gait problem. Negative for joint swelling.  Skin: Positive for color change. Negative for pallor and rash.       Bruise right shoulder.   Neurological: Negative for speech difficulty, weakness and headaches.       Dementia.   Psychiatric/Behavioral: Positive for agitation, behavioral problems and confusion. Negative for hallucinations.    Immunization History  Administered Date(s) Administered  . Influenza-Unspecified 12/22/2013, 11/09/2014, 12/05/2015  . PPD Test 01/03/2015  . Pneumococcal Polysaccharide-23 12/02/1993  . Tdap 01/08/2013  . Zoster 12/03/2007   Pertinent  Health Maintenance Due  Topic Date Due  . PNA vac Low Risk Adult (2 of 2 - PCV13) 12/03/1994  . INFLUENZA VACCINE  09/18/2016   Fall Risk  10/24/2016 02/10/2015 06/27/2014  Falls in the past year? No Yes No  Number falls in past yr: - 1 -  Injury with Fall? - No -  Risk for fall due to : - History of fall(s) Impaired balance/gait  Follow up - Falls evaluation completed -   Functional Status Survey:    Vitals:   03/31/17 1104  BP: 130/80  Pulse: 66  Resp: 18  Temp: (!) 96.7 F (35.9 C)  SpO2: 95%  Weight: 183 lb 9.6 oz (83.3 kg)  Height: 6' (1.829 m)   Body mass index is 24.9 kg/m. Physical Exam  Constitutional: He appears well-developed and well-nourished.  HENT:  Head: Normocephalic and atraumatic.  Eyes: Conjunctivae and EOM are normal. Pupils are equal, round, and reactive to light.  Neck: Normal range of motion. Neck supple. No thyromegaly present.  Cardiovascular: Normal rate.  Murmur heard. Irregular heart beats.   Pulmonary/Chest: Effort normal and breath sounds  normal. He has no wheezes. He has no rales.  Abdominal: Soft. Bowel sounds are normal. He exhibits no distension. There is no tenderness. There is no rebound and no guarding.  Musculoskeletal: Normal range of motion. He exhibits edema.  Trace edema BLE, wears compression hosiery. Mechanical lift for transfer, w/c dependent.   Neurological: He is alert. He exhibits normal muscle tone. Coordination normal.  Oriented to self.   Skin: Skin is warm and dry. No rash noted. No pallor.  The right shoulder bruise.   Psychiatric: He has a normal mood and affect.  Agitation and restless sometimes.     Labs reviewed: Recent Labs    07/30/16 09/02/16 01/07/17  NA 140 142 143  K 4.0 3.9 3.9  BUN 25* 22* 23*  CREATININE 1.2 1.1 1.3   Recent Labs    07/30/16 01/07/17  AST 13* 20  ALT 12 22  ALKPHOS 57 82   Recent Labs    06/06/16 09/02/16 01/07/17  WBC 4.8 13.0 4.1  HGB 13.0* 12.0* 11.2*  HCT 37* 35* 33*  PLT 204 174 165   Lab Results  Component Value Date   TSH 0.93 01/23/2017   Lab Results  Component Value Date   HGBA1C 5.9 (H) 12/15/2014   Lab Results  Component Value Date   CHOL 95 12/26/2015   HDL 42 12/26/2015   LDLCALC 46 12/26/2015   TRIG 37 (A) 12/26/2015   CHOLHDL 2.6 12/07/2014    Significant Diagnostic Results in last 30 days:  No results found.  Assessment/Plan Abrasion of right shoulder the right shoulder bruise 03/28/17, no decreased ROM upon my visit, he was able to put his right hand behind head.   Alzheimer's disease history of dementia, he is agitated and restless at times,unable to provided HPI, but able to voice pain, he is lacking of safety awareness. He is mechanical lift for transfer, wheelchair dependent. Close supervision for safety. Continue Hospice service for comfort measures.   Lowry Bowl 03/30/17, found lying on the floor in his room, with no noted injury. Close supervision for safety.       Family/ staff Communication: plan of care  reviewed with the patient and charge nurse  Labs/tests ordered:  None  Time spend 25 minutes.

## 2017-03-31 NOTE — Assessment & Plan Note (Signed)
Fell 03/30/17, found lying on the floor in his room, with no noted injury. Close supervision for safety.

## 2017-04-10 ENCOUNTER — Non-Acute Institutional Stay (SKILLED_NURSING_FACILITY): Payer: Medicare Other | Admitting: Nurse Practitioner

## 2017-04-10 ENCOUNTER — Encounter: Payer: Self-pay | Admitting: Nurse Practitioner

## 2017-04-10 DIAGNOSIS — I10 Essential (primary) hypertension: Secondary | ICD-10-CM | POA: Diagnosis not present

## 2017-04-10 DIAGNOSIS — G301 Alzheimer's disease with late onset: Secondary | ICD-10-CM

## 2017-04-10 DIAGNOSIS — E039 Hypothyroidism, unspecified: Secondary | ICD-10-CM | POA: Diagnosis not present

## 2017-04-10 DIAGNOSIS — F5101 Primary insomnia: Secondary | ICD-10-CM

## 2017-04-10 DIAGNOSIS — F0280 Dementia in other diseases classified elsewhere without behavioral disturbance: Secondary | ICD-10-CM

## 2017-04-10 NOTE — Assessment & Plan Note (Signed)
Continue Trazodone, his mood is stable otherwise except sometimes he doesn't sleep well at night, POA declined hypnotics, the patient takes frequent naps to compensate lack of sleep at night. Observe.

## 2017-04-10 NOTE — Assessment & Plan Note (Signed)
The patient has history of dementia, mechanical lift for transfer, wheelchair to get around, he doesn't always sleeps well at night, agitated and combative sometimes when he is assisted with ADLs since he is lacking of compression of surroundings. Continue supportive care, Hospice service

## 2017-04-10 NOTE — Assessment & Plan Note (Signed)
Hypothyroidism, continue Levothyroxine 136mcg qd, last TSH 01/23/17.

## 2017-04-10 NOTE — Assessment & Plan Note (Signed)
.   His blood pressure and heart rate are well controlled, continue Carvedilol 3.125mg  bid.

## 2017-04-10 NOTE — Progress Notes (Signed)
Location:   SNF [place] Room Number: 105 Place of Service:  SNF (31) Provider: Lennie Odor Dartanyon Frankowski NP  Blanchie Serve, MD  Patient Care Team: Blanchie Serve, MD as PCP - General (Internal Medicine) Zebedee Iba., MD as Referring Physician (Ophthalmology) Darlin Coco, MD as Consulting Physician (Cardiology) Garvin Fila, MD as Consulting Physician (Neurology) Festus Aloe, MD as Consulting Physician (Urology) Marilynne Halsted, MD as Referring Physician (Ophthalmology) Lavonna Monarch, MD as Consulting Physician (Dermatology) Gerarda Fraction, MD as Referring Physician (Ophthalmology) Cameran Ahmed X, NP as Nurse Practitioner (Internal Medicine)  Extended Emergency Contact Information Primary Emergency Contact: Farr,Laura Address: [address] [address] Johnnette Litter of [place] Phone: [phone] Mobile Phone: [phone] Relation: Daughter Secondary Emergency Contact: Genia Plants States of [place] Phone: [phone] Relation: Daughter  Code Status:  DNR Goals of care: Advanced Directive information Advanced Directives 04/10/2017  Does Patient Have a Medical Advance Directive? Yes  Type of Advance Directive Out of facility DNR (pink MOST or yellow form)  Does patient want to make changes to medical advance directive? No - Patient declined  Would patient like information on creating a medical advance directive? -  Pre-existing out of facility DNR order (yellow form or pink MOST form) Yellow form placed in chart (order not valid for inpatient use)     Chief Complaint  Patient presents with  . Medical Management of Chronic Issues    Routine Visit     HPI:  Pt is a 84 y.o. male seen today for medical management of chronic diseases.    The patient has history of dementia, mechanical lift for transfer, wheelchair to get around, he doesn't always sleeps well at night, agitated and combative sometimes when he is assisted  with ADLs since he is lacking of compression of surroundings. He takes Trazodone, his mood is stable otherwise. His blood pressure and heart rate are well controlled on Carvedilol 3.125mg  bid. Hypothyroidism, on Levothyroxine 149mcg qd, last TSH 01/23/17.    Past Medical History:  Diagnosis Date  . Abnormality of gait 07/04/2012  . Actinic keratoses   . Arthritis   . Benign prostatic hyperplasia   . BPH (benign prostatic hyperplasia)   . CAD (coronary artery disease) of artery bypass graft 12/02/2013  . CAD (coronary artery disease) of bypass graft    a. s/p CABG 1991 without subsequent interventions.  . Chorioretinitis   . Chronic diastolic CHF (congestive heart failure) ([place])   . Colon cancer ([place])    a. admitted to [facility] 12/07/14 with profound iron deficiency anemia Hgb 4.5, melena, with new diagnosis of invasive adenocarcinoma of colon s/p lap R colectomy on 12/15/14.  . Dementia   . Diverticulitis   . Dizziness and giddiness 07/04/2012  . Edema   . Essential hypertension   . Hereditary and idiopathic peripheral neuropathy 07/04/2012   a history of chronic sensory polyneuropathy of undetermined origin with abnormality of gait. He is walking with a rolling walker. Last MRA of the head January 2013 shows no significant stenosis, MRA of the neck shows mild stenosis of the proximal left internal carotid artery.    Marland Kitchen History of atrial fibrillation   . HLD (hyperlipidemia)   . HTN (hypertension) 12/01/2013  . Hyperlipidemia   . Hypothyroidism 12/01/2013  . Hypothyroidism   . 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 Internal carotid artery stenosis    left  . Iron deficiency anemia  a. 11/2014 - Hgb 4.5 in setting of newly dx colon CA.   . Memory loss   . Mild left ventricular hypertrophy   . Neuromuscular disorder ([place])   . PAF (paroxysmal atrial fibrillation) ([place])    a. Not on anticoag due to bleeding and fall risk.  . Panuveitis   . Peripheral autonomic neuropathy in  disorders classified elsewhere(337.1) 07/04/2012  . Prostatitis, acute    1991  . Retinitis   . Stroke ([place])   . Thyroid disease   . Transient cerebral ischemia 07/04/2012   a posterior circulation TIA in October 2009.    . 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 APPENDECTOMY    . CARDIAC CATHETERIZATION    . COLON RESECTION N/A 12/15/2014   Procedure: Laparoscopic partial colectomy;  Surgeon: Autumn Messing III, MD;  [facility];  Service: General;  Laterality: N/A;  . COLONOSCOPY WITH PROPOFOL N/A 12/12/2014   Procedure: COLONOSCOPY WITH PROPOFOL;  Surgeon: Gatha Mayer, MD;  [facility];  Service: Endoscopy;  Laterality: N/A;  . CORONARY ARTERY BYPASS GRAFT  1991   grafts to LAD and RCA  . CORONARY ARTERY BYPASS GRAFT  1991    Allergies  Allergen Reactions  . Sulfa Antibiotics Other (See Comments)    unknown  . Ativan [Lorazepam] Anxiety    Allergies as of 04/10/2017      Reactions   Sulfa Antibiotics Other (See Comments)   unknown   Ativan [lorazepam] Anxiety      Medication List        Accurate as of 04/10/17 11:59 PM. Always use your most recent med list.          acetaminophen 325 MG tablet Commonly known as:  TYLENOL Take 650 mg by mouth 2 (two) times daily. Take 2 every 6 hours as needed for mild pain   carvedilol 3.125 MG tablet Commonly known as:  COREG Take 1 tablet (3.125 mg total) by mouth 2 (two) times daily with a meal.   levothyroxine 175 MCG tablet Commonly known as:  SYNTHROID, LEVOTHROID Take 175 mcg by mouth daily before breakfast.   NON FORMULARY Take 4 oz by mouth 2 (two) times daily. Honey thick mighty shake   nystatin cream Commonly known as:  MYCOSTATIN Apply 1 application topically 2 (two) times daily.   polyethylene glycol packet Commonly known as:  MIRALAX / GLYCOLAX Take 17 g by mouth daily as needed.   traZODone 100 MG tablet Commonly known as:   DESYREL Take 100 mg by mouth at bedtime.      ROS was provided with assistance of staff and family.   Review of Systems  Constitutional: Negative for appetite change, chills, diaphoresis, fatigue and fever.  HENT: Positive for hearing loss. Negative for congestion, trouble swallowing and voice change.        Okay on honey thick liquids.   Eyes: Negative for visual disturbance.  Respiratory: Negative for choking, shortness of breath and wheezing.   Cardiovascular: Positive for leg swelling. Negative for chest pain and palpitations.  Gastrointestinal: Negative for abdominal distention, abdominal pain, constipation, diarrhea, nausea and vomiting.  Endocrine: Negative for cold intolerance.  Genitourinary: Negative for dysuria and urgency.       Incontinent of urine.   Musculoskeletal: Positive for gait problem.       No longer ambulatory  Skin: Negative for color change and pallor.  Neurological: Negative for tremors, speech difficulty, weakness and headaches.  Dementia.   Psychiatric/Behavioral: Positive for agitation, behavioral problems, confusion and sleep disturbance. Negative for hallucinations. The patient is not nervous/anxious.     Immunization History  Administered Date(s) Administered  . Influenza-Unspecified 12/22/2013, 11/09/2014, 12/05/2015  . PPD Test 01/03/2015  . Pneumococcal Polysaccharide-23 12/02/1993  . Tdap 01/08/2013  . Zoster 12/03/2007   Pertinent  Health Maintenance Due  Topic Date Due  . PNA vac Low Risk Adult (2 of 2 - PCV13) 12/03/1994  . INFLUENZA VACCINE  09/18/2016   Fall Risk  10/24/2016 02/10/2015 06/27/2014  Falls in the past year? No Yes No  Number falls in past yr: - 1 -  Injury with Fall? - No -  Risk for fall due to : - History of fall(s) Impaired balance/gait  Follow up - Falls evaluation completed -   Functional Status Survey:    Vitals:   04/10/17 1622  BP: 128/72  Pulse: 64  Resp: 18  Temp: (!) 96.7 F (35.9 C)  TempSrc:  Oral  SpO2: 95%  Weight: 183 lb 9.6 oz (83.3 kg)  Height: 6' (1.829 m)   Body mass index is 24.9 kg/m. Physical Exam  Constitutional: He appears well-developed and well-nourished.  HENT:  Head: Normocephalic and atraumatic.  Eyes: Conjunctivae and EOM are normal. Pupils are equal, round, and reactive to light.  Neck: Normal range of motion. Neck supple. No JVD present. No thyromegaly present.  Cardiovascular: Normal rate and regular rhythm.  Murmur heard. Pulmonary/Chest: Effort normal. He has no wheezes. He has no rales.  Abdominal: Soft. Bowel sounds are normal. He exhibits no distension. There is no tenderness.  Musculoskeletal: He exhibits edema.  Trace edema in ankle. Mechanical lift for transfer. Wheelchair for mobility.   Neurological: He is alert. He exhibits normal muscle tone. Coordination normal.  Oriented to self only.   Skin: Skin is warm and dry. No rash noted.  Psychiatric: He has a normal mood and affect.    Labs reviewed: Recent Labs    07/30/16 09/02/16 01/07/17  NA 140 142 143  K 4.0 3.9 3.9  BUN 25* 22* 23*  CREATININE 1.2 1.1 1.3   Recent Labs    07/30/16 01/07/17  AST 13* 20  ALT 12 22  ALKPHOS 57 82   Recent Labs    06/06/16 09/02/16 01/07/17  WBC 4.8 13.0 4.1  HGB 13.0* 12.0* 11.2*  HCT 37* 35* 33*  PLT 204 174 165   Lab Results  Component Value Date   TSH 0.93 01/23/2017   Lab Results  Component Value Date   HGBA1C 5.9 (H) 12/15/2014   Lab Results  Component Value Date   CHOL 95 12/26/2015   HDL 42 12/26/2015   LDLCALC 46 12/26/2015   TRIG 37 (A) 12/26/2015   CHOLHDL 2.6 12/07/2014    Significant Diagnostic Results in last 30 days:  No results found.  Assessment/Plan  Essential hypertension . His blood pressure and heart rate are well controlled, continue Carvedilol 3.125mg  bid.  Alzheimer's disease The patient has history of dementia, mechanical lift for transfer, wheelchair to get around, he doesn't always sleeps well  at night, agitated and combative sometimes when he is assisted with ADLs since he is lacking of compression of surroundings. Continue supportive care, Hospice service  Insomnia Continue Trazodone, his mood is stable otherwise except sometimes he doesn't sleep well at night, POA declined hypnotics, the patient takes frequent naps to compensate lack of sleep at night. Observe.   Hypothyroidism Hypothyroidism, continue Levothyroxine 147mcg qd, last  TSH 01/23/17.    Family/ staff Communication: plan of care reviewed with the patient, patient's POA, daughter, and charge nurse.   Labs/tests ordered: none  Time spend 25 minutes.

## 2017-04-23 ENCOUNTER — Encounter: Payer: Self-pay | Admitting: Nurse Practitioner

## 2017-04-23 ENCOUNTER — Non-Acute Institutional Stay (SKILLED_NURSING_FACILITY): Payer: Medicare Other | Admitting: Nurse Practitioner

## 2017-04-23 DIAGNOSIS — G301 Alzheimer's disease with late onset: Secondary | ICD-10-CM

## 2017-04-23 DIAGNOSIS — I10 Essential (primary) hypertension: Secondary | ICD-10-CM | POA: Diagnosis not present

## 2017-04-23 DIAGNOSIS — I480 Paroxysmal atrial fibrillation: Secondary | ICD-10-CM

## 2017-04-23 DIAGNOSIS — F0280 Dementia in other diseases classified elsewhere without behavioral disturbance: Secondary | ICD-10-CM | POA: Diagnosis not present

## 2017-04-23 NOTE — Assessment & Plan Note (Signed)
HPI was provided with assistance of staff due to his advanced dementia.

## 2017-04-23 NOTE — Progress Notes (Signed)
Location:   [facility] Room Number: 105 Place of Service:  SNF (31) Provider: Lennie Odor Sua Spadafora NP  Blanchie Serve, MD  Patient Care Team: Blanchie Serve, MD as PCP - General (Internal Medicine) Zebedee Iba., MD as Referring Physician (Ophthalmology) Darlin Coco, MD as Consulting Physician (Cardiology) Garvin Fila, MD as Consulting Physician (Neurology) Festus Aloe, MD as Consulting Physician (Urology) Marilynne Halsted, MD as Referring Physician (Ophthalmology) Lavonna Monarch, MD as Consulting Physician (Dermatology) Gerarda Fraction, MD as Referring Physician (Ophthalmology) Natsuko Kelsay X, NP as Nurse Practitioner (Internal Medicine)  Extended Emergency Contact Information Primary Emergency Contact: Farr,Laura Address: [address] [place] [address] Johnnette Litter of [place] Phone: [phone] Mobile Phone: [phone] Relation: Daughter Secondary Emergency Contact: Genia Plants [place] of [place] Phone: [phone] Relation: Daughter  Code Status: DNR Goals of care: Advanced Directive information Advanced Directives 04/10/2017  Does Patient Have a Medical Advance Directive? Yes  Type of Advance Directive Out of facility DNR (pink MOST or yellow form)  Does patient want to make changes to medical advance directive? No - Patient declined  Would patient like information on creating a medical advance directive? -  Pre-existing out of facility DNR order (yellow form or pink MOST form) Yellow form placed in chart (order not valid for inpatient use)     Chief Complaint  Patient presents with  . Acute Visit    slow heart beats in 50s, lethargy    HPI:  Pt is a 84 y.o. male seen today for an acute visit for slow heart beats in 50s, low BP measurements 90/60 mmHg, the patient seems lethargic, but able to be aroused easily. HPI was provided with assistance of staff due to his advanced dementia. Hx of HTN and Afib. He is  afebrile, no new focal neurological symptoms.    Past Medical History:  Diagnosis Date  . Abnormality of gait 07/04/2012  . Actinic keratoses   . Arthritis   . Benign prostatic hyperplasia   . BPH (benign prostatic hyperplasia)   . CAD (coronary artery disease) of artery bypass graft 12/02/2013  . CAD (coronary artery disease) of bypass graft    a. s/p CABG 1991 without subsequent interventions.  . Chorioretinitis   . Chronic diastolic CHF (congestive heart failure) ([place])   . Colon cancer ([place])    a. admitted to [facility]/07[facility]14 with profound iron deficiency anemia Hgb 4.5, melena, with new diagnosis of invasive adenocarcinoma of colon s/p lap R colectomy on 12/15/14.  . Dementia   . Diverticulitis   . Dizziness and giddiness 07/04/2012  . Edema   . Essential hypertension   . Hereditary and idiopathic peripheral neuropathy 07/04/2012   a history of chronic sensory polyneuropathy of undetermined origin with abnormality of gait. He is walking with a rolling walker. Last MRA of the head January 2013 shows no significant stenosis, MRA of the neck shows mild stenosis of the proximal left internal carotid artery.    Marland Kitchen History of atrial fibrillation   . HLD (hyperlipidemia)   . HTN (hypertension) 12/01/2013  . Hyperlipidemia   . Hypothyroidism 12/01/2013  . Hypothyroidism   . 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 Internal carotid artery stenosis    left  . Iron deficiency anemia    a. 11/2014 - Hgb 4.5 in setting of newly dx colon CA.   . Memory loss   . Mild left ventricular hypertrophy   .  Neuromuscular disorder ([place])   . PAF (paroxysmal atrial fibrillation) ([place])    a. Not on anticoag due to bleeding and fall risk.  . Panuveitis   . Peripheral autonomic neuropathy in disorders classified elsewhere(337.1) 07/04/2012  . Prostatitis, acute    1991  . Retinitis   . Stroke ([place])   . Thyroid disease   . Transient cerebral ischemia 07/04/2012   a posterior circulation TIA  in October 2009.    . 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 APPENDECTOMY    . CARDIAC CATHETERIZATION    . COLON RESECTION N/A 12/15/2014   Procedure: Laparoscopic partial colectomy;  Surgeon: Autumn Messing III, MD;  Location: [facility];  Service: General;  Laterality: N/A;  . COLONOSCOPY WITH PROPOFOL N/A 12/12/2014   Procedure: COLONOSCOPY WITH PROPOFOL;  Surgeon: Gatha Mayer, MD;  Location: [facility] END[facility]COPY;  Service: Endoscopy;  Laterality: N/A;  . CORONARY ARTERY BYPASS GRAFT  1991   grafts to LAD and RCA  . CORONARY ARTERY BYPASS GRAFT  1991    Allergies  Allergen Reactions  . Sulfa Antibiotics Other (See Comments)    unknown  . Ativan [Lorazepam] Anxiety    Allergies as of 04/23/2017      Reactions   Sulfa Antibiotics Other (See Comments)   unknown   Ativan [lorazepam] Anxiety      Medication List        Accurate as of 04/23/17 11:59 PM. Always use your most recent med list.          acetaminophen 325 MG tablet Commonly known as:  TYLENOL Take 650 mg by mouth 2 (two) times daily. Take 2 every 6 hours as needed for mild pain   carvedilol 3.125 MG tablet Commonly known as:  COREG Take 1 tablet (3.125 mg total) by mouth 2 (two) times daily with a meal.   levothyroxine 175 MCG tablet Commonly known as:  SYNTHROID, LEVOTHROID Take 175 mcg by mouth daily before breakfast.   NON FORMULARY Take 4 oz by mouth 2 (two) times daily. Honey thick mighty shake   nystatin cream Commonly known as:  MYCOSTATIN Apply 1 application topically 2 (two) times daily.   polyethylene glycol packet Commonly known as:  MIRALAX / GLYCOLAX Take 17 g by mouth daily as needed.   traZODone 100 MG tablet Commonly known as:  DESYREL Take 100 mg by mouth at bedtime.      ROS was provided with assistance of staff Review of Systems  Constitutional: Positive for fatigue. Negative for activity change, appetite change, chills,  diaphoresis and fever.  HENT: Positive for hearing loss and trouble swallowing. Negative for congestion and voice change.   Respiratory: Negative for cough, shortness of breath and wheezing.   Cardiovascular: Negative for chest pain, palpitations and leg swelling.  Gastrointestinal: Negative for abdominal distention and abdominal pain.  Genitourinary:       Incontinent of urine.   Musculoskeletal:       Non ambulatory  Skin: Negative for color change.  Neurological: Negative for speech difficulty, weakness and headaches.  Psychiatric/Behavioral: Positive for agitation, behavioral problems and confusion. Negative for hallucinations. The patient is not nervous/anxious.     Immunization History  Administered Date(s) Administered  . Influenza-Unspecified 12/22/2013, 11/09/2014, 12/05/2015  . PPD Test 01/03/2015  . Pneumococcal Polysaccharide-23 12/02/1993  . Tdap 01/08/2013  . Zoster 12/03/2007   Pertinent  Health Maintenance Due  Topic Date Due  . PNA vac Low Risk Adult (  2 of 2 - PCV13) 12/03/1994  . INFLUENZA VACCINE  09/18/2016   Fall Risk  10/24/2016 02/10/2015 06/27/2014  Falls in the past year? No Yes No  Number falls in past yr: - 1 -  Injury with Fall? - No -  Risk for fall due to : - History of fall(s) Impaired balance/gait  Follow up - Falls evaluation completed -   Functional Status Survey:    Vitals:   04/23/17 1242  BP: 100/60  Pulse: (!) 50  Resp: 16  Temp: 98.2 F (36.8 C)   There is no height or weight on file to calculate BMI. Physical Exam  Constitutional: He appears well-developed and well-nourished.  HENT:  Head: Normocephalic and atraumatic.  Eyes: Conjunctivae and EOM are normal. Pupils are equal, round, and reactive to light.  Neck: Normal range of motion. Neck supple. No JVD present. No thyromegaly present.  Cardiovascular: Normal rate and regular rhythm.  Murmur heard. Pulmonary/Chest: Effort normal and breath sounds normal. He has no wheezes. He  has no rales.  Abdominal: Bowel sounds are normal.  Musculoskeletal: He exhibits no tenderness.  Seen in wheelchair.   Neurological: He is alert. He exhibits normal muscle tone. Coordination normal.  Oriented to self.   Skin: Skin is warm and dry.  Psychiatric: He has a normal mood and affect.    Labs reviewed: Recent Labs    07/30/16 09/02/16 01/07/17  NA 140 142 143  K 4.0 3.9 3.9  BUN 25* 22* 23*  CREATININE 1.2 1.1 1.3   Recent Labs    07/30/16 01/07/17  AST 13* 20  ALT 12 22  ALKPHOS 57 82   Recent Labs    06/06/16 09/02/16 01/07/17  WBC 4.8 13.0 4.1  HGB 13.0* 12.0* 11.2*  HCT 37* 35* 33*  PLT 204 174 165   Lab Results  Component Value Date   TSH 0.93 01/23/2017   Lab Results  Component Value Date   HGBA1C 5.9 (H) 12/15/2014   Lab Results  Component Value Date   CHOL 95 12/26/2015   HDL 42 12/26/2015   LDLCALC 46 12/26/2015   TRIG 37 (A) 12/26/2015   CHOLHDL 2.6 12/07/2014    Significant Diagnostic Results in last 30 days:  No results found.  Assessment/Plan: PAF (paroxysmal atrial fibrillation) (HCC) slow heart beats in 50s, low BP measurements 90/60 mmHg, the patient seems lethargic, but able to be aroused easily. He is afebrile, no new focal neurological symptoms. Dc Coreg, VS q shift x 1week, then re evaluate.    Essential hypertension Low blood pressure measurements, dc Coreg, observe   Alzheimer's disease HPI was provided with assistance of staff due to his advanced dementia.    Family/ staff Communication: plan of care reviewed with the patient and charge nurse.   Labs/tests ordered:  None  Time spend 25 minutes.

## 2017-04-23 NOTE — Assessment & Plan Note (Signed)
slow heart beats in 50s, low BP measurements 90/60 mmHg, the patient seems lethargic, but able to be aroused easily. He is afebrile, no new focal neurological symptoms. Dc Coreg, VS q shift x 1week, then re evaluate.

## 2017-04-23 NOTE — Assessment & Plan Note (Signed)
Low blood pressure measurements, dc Coreg, observe

## 2017-05-19 DEATH — deceased

## 2018-11-08 IMAGING — CR DG TIBIA/FIBULA 2V*R*
4 series · 4 of 4 positions shown · non-contrast
Comparison: None.

CLINICAL DATA: Fall from a wheelchair

EXAM:
RIGHT TIBIA AND FIBULA - 2 VIEW

[x tib-fib ap right]
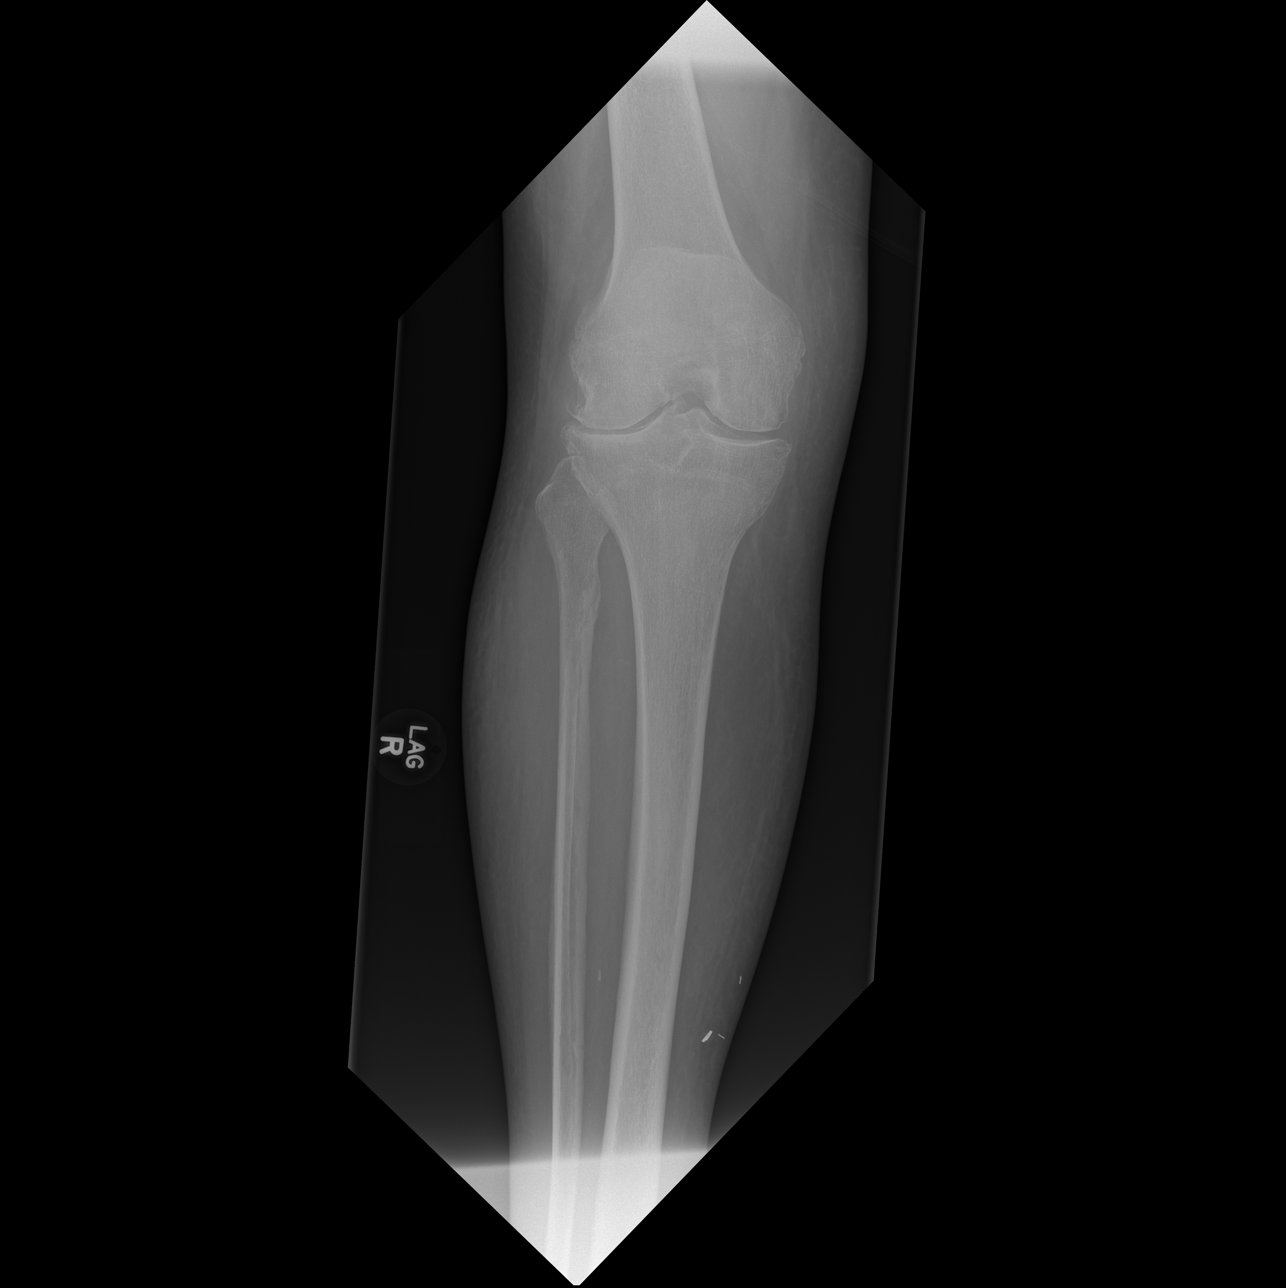

[x tib-fib lat right (1 of 3)]
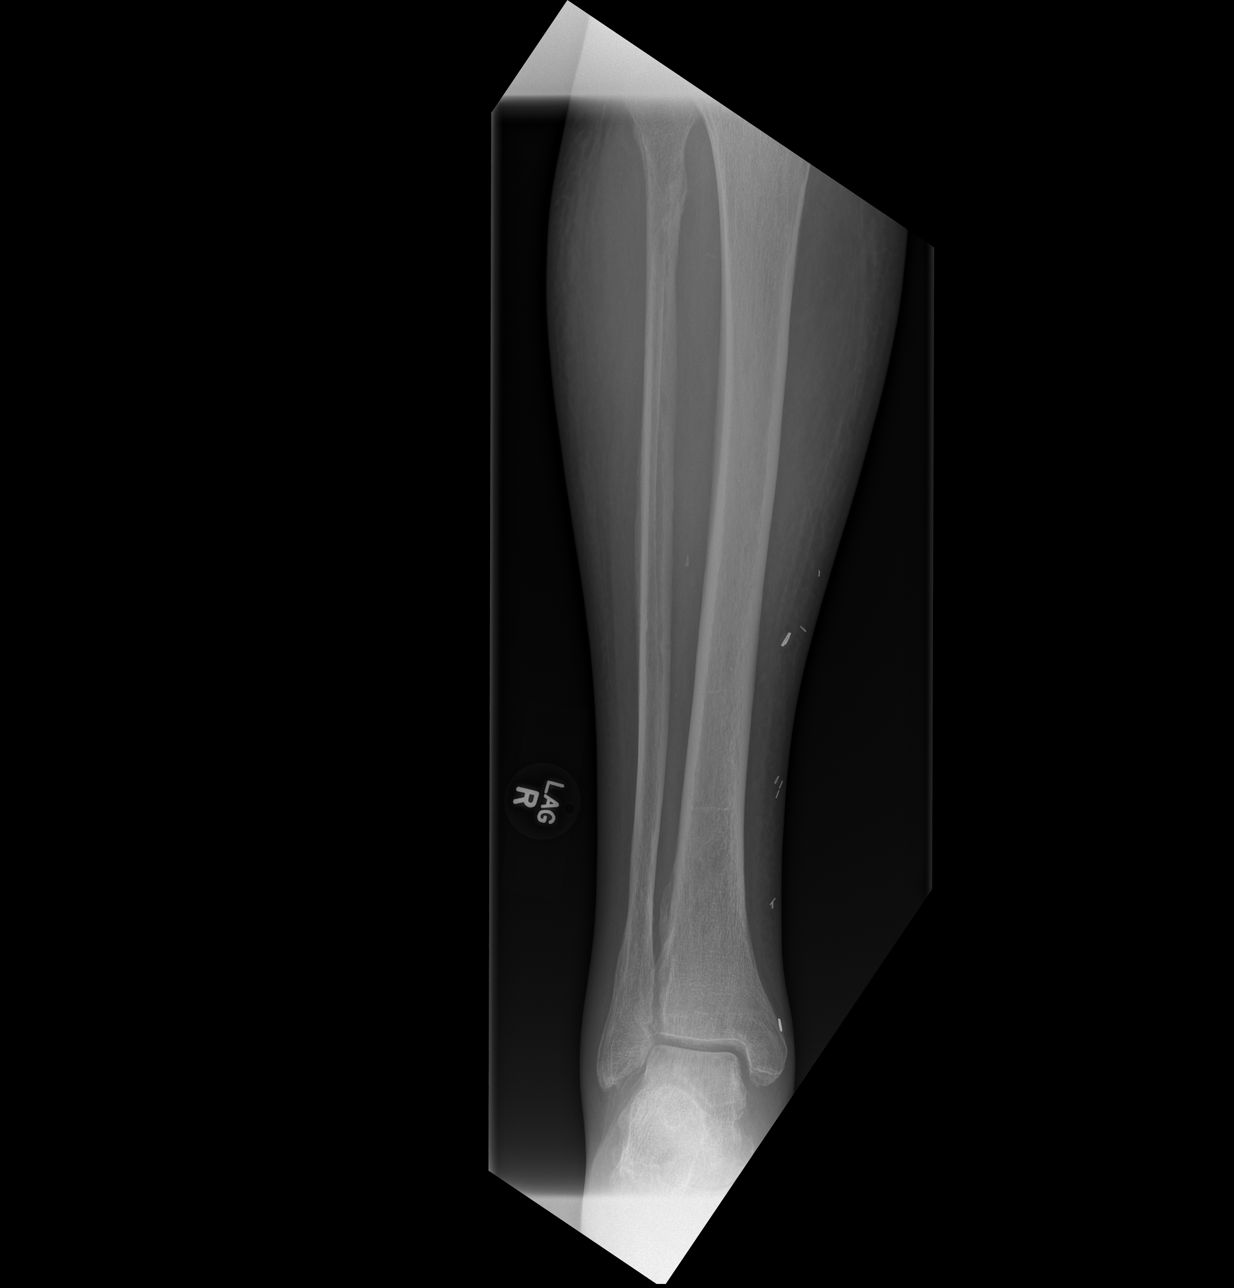

[x tib-fib lat right (2 of 3)]
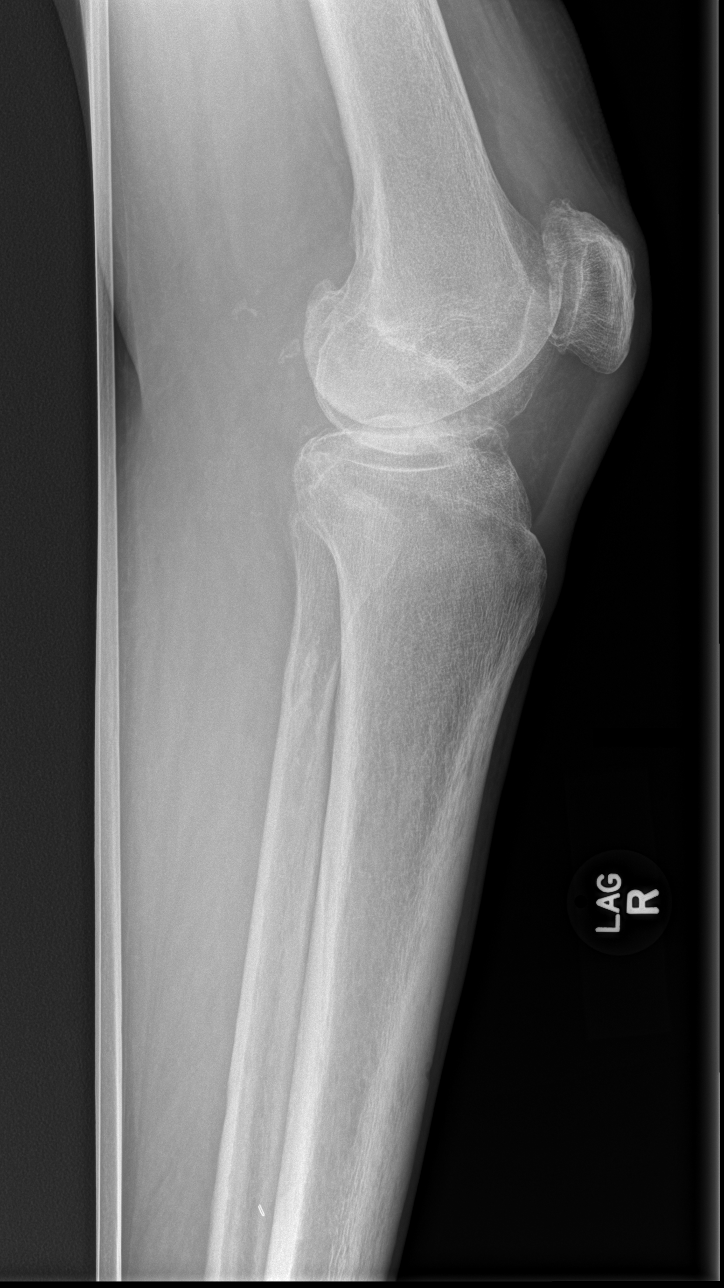

[x tib-fib lat right (3 of 3)]
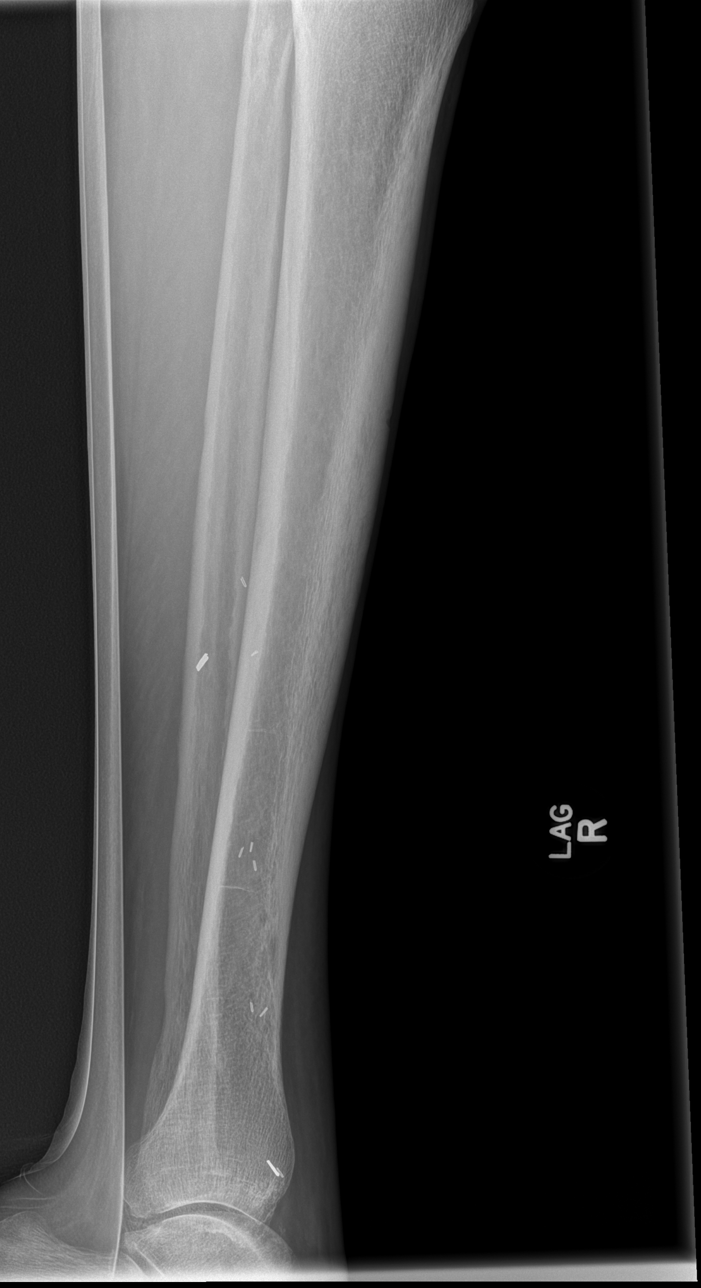

[4 of 4 positions shown; findings below may reference images not displayed]

FINDINGS: Possible subtle nondisplaced distal fibular fracture on the obliques
view. The proximal tibia and fibula appear intact. Moderate
degenerative changes at the right knee. Small effusion. Clips within
the medial soft tissues.
IMPRESSION: Possible subtle nondisplaced distal fibular fracture.
